# Patient Record
Sex: Female | Born: 1950 | Race: White | Hispanic: No | Marital: Single | Smoking: Current every day smoker
Health system: Southern US, Community
[De-identification: ages and names within clinical notes are randomized; demographics above are authoritative.]

## PROBLEM LIST (undated history)

## (undated) DIAGNOSIS — K219 Gastro-esophageal reflux disease without esophagitis: Secondary | ICD-10-CM

## (undated) DIAGNOSIS — R011 Cardiac murmur, unspecified: Secondary | ICD-10-CM

## (undated) DIAGNOSIS — I10 Essential (primary) hypertension: Secondary | ICD-10-CM

## (undated) DIAGNOSIS — K449 Diaphragmatic hernia without obstruction or gangrene: Secondary | ICD-10-CM

## (undated) DIAGNOSIS — R1013 Epigastric pain: Secondary | ICD-10-CM

## (undated) DIAGNOSIS — IMO0002 Reserved for concepts with insufficient information to code with codable children: Secondary | ICD-10-CM

## (undated) DIAGNOSIS — I509 Heart failure, unspecified: Secondary | ICD-10-CM

## (undated) DIAGNOSIS — K222 Esophageal obstruction: Secondary | ICD-10-CM

## (undated) DIAGNOSIS — F419 Anxiety disorder, unspecified: Secondary | ICD-10-CM

## (undated) HISTORY — DX: Essential (primary) hypertension: I10

## (undated) HISTORY — PX: KNEE ARTHROSCOPY: SUR90

## (undated) HISTORY — DX: Diaphragmatic hernia without obstruction or gangrene: K44.9

## (undated) HISTORY — PX: ABDOMINAL HYSTERECTOMY: SHX81

## (undated) HISTORY — DX: Anxiety disorder, unspecified: F41.9

## (undated) HISTORY — DX: Reserved for concepts with insufficient information to code with codable children: IMO0002

## (undated) HISTORY — PX: OTHER SURGICAL HISTORY: SHX169

## (undated) HISTORY — DX: Epigastric pain: R10.13

## (undated) HISTORY — DX: Cardiac murmur, unspecified: R01.1

## (undated) HISTORY — PX: TONSILLECTOMY AND ADENOIDECTOMY: SUR1326

## (undated) HISTORY — DX: Gastro-esophageal reflux disease without esophagitis: K21.9

## (undated) HISTORY — DX: Esophageal obstruction: K22.2

## (undated) NOTE — *Deleted (*Deleted)
NAME:  [name], MRN:  [id number], DOB:  1950/12/05, LOS: 4 ADMISSION DATE:  08/28/2020, CONSULTATION DATE:  08/28/2020 REFERRING MD:  ED, CHIEF COMPLAINT:  Found down   Brief History   9 yo F who presented after being found down, hypoxemic the ED and intubated in the setting of suspected aspiration PNA in setting of large hiatal hernia.   History of present illness   30 yo F with a history of hiatal hernia, schatski ring, hyponatremia who was found down at home by her brother after she was last known normal yesterday.  When EMS arrived, her O2 sats were in the 60s and had difficulty breathing.  She was thus placed on NRB, and when assessed in the ED, decision was made to intubate.   CXR showed right sided atelectasis and likely infiltrate.    Brother is her primary caregiver.  He says that she has had a rough year, with frequent admissions for lightheadedness and confusion and found to be hyponatremic and hypokalemic.  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  She has lost appetite, has episodes of dry heaving and vomiting- usually when she has low Na and low K.  Normally, she has a gradual clinical decline before admission for hyponatremia, however the events over the last 24 hours were very acute.  Patient lives alone, manages her own meds.  She has had substantial weight loss over the last 1 year.    10/21 bedside RN states that they attempted gastric tube placement but unsuccessful. She was not taken to IR yesterday. RN states the plan is to go to IR today.  Past Medical History  Hiatal hernia Hyponatremia Scatski ring Current smoker SIADH - HCTZ contribution, discontinued   Significant Hospital Events   ETT 10/17 >>  Consults:    Procedures:    Significant Diagnostic Tests:  CXR 10/17 >> Right mid to lower lung field density, likely atelectasis. Aspiration is not  excluded. CT Head 10/17 >> no acute process CT Chest 10/17 >> LLL collapse, consolidation bilateral bases, probable aspiration   Micro Data:  COVID 10/17 >> negative  Influenza A/B 10/17 >> negative Tracheal aspirate 10/17 >>  MRSA PCR 10/18 >> negative  BCx2 10/17 >>    UC 10/17 >> 30k proteus >> S-cefazolin, ceftriaxone. R-imipenem, nitrofurantoin  Antimicrobials:  Ceftriaxone 10/17 >> Azithromycin 10/17 >>  Interim history/subjective:  Tmax 99.9 / WBC down to 14.5  On vent - 30% fiO2, PEEP 5 Glucose range 88-95 I/O 1L UOP, +3.5L in last 24 hours   Objective   Blood pressure (!) 194/146, pulse (!) 115, temperature 98.8 F (37.1 C), resp. rate (!) 27, height 5\' 2"  (1.575 m), weight 46.4 kg, SpO2 100 %.    Vent Mode: PRVC FiO2 (%):  [30 %] 30 % Set Rate:  [16 bmp] 16 bmp Vt Set:  [400 mL] 400 mL PEEP:  [5 cmH20-30 cmH20] 5 cmH20 Plateau Pressure:  [14 cmH20-18 cmH20] 18 cmH20   Intake/Output Summary (Last 24 hours) at 09/01/2020 0929 Last data filed at 09/01/2020 0538 Gross per 24 hour  Intake 2139.59 ml  Output 1050 ml  Net 1089.59 ml   Filed Weights   08/30/20 0326 08/31/20 0116 09/01/20 0220  Weight: 43.6 kg 45.5 kg 46.4 kg    Examination: General: cachectic / frail adult female lying in bed on vent in NAD HEENT: MM pink/moist, ETT, small  bore nasogastric feeding tube Neuro: sedate, fixed gaze, not following commands; gag reflex  CV: s1s2 rrr, no m/r/g PULM: non-labored on vent, lungs bilaterally coarse; small white thick secretions GI: soft, bsx4 active  Extremities: warm/dry, no edema  Skin: thin dry skin with multiple areas of ecchymosis, mottling on LE's  Assessment & Plan:   71 yo F with a recent significant functional decline, episodes of hyponatremia (due to thiazides, hypovolemia, and ?SIADH), weight loss who presents after being found down and hypoxic, intubated in the ED for airway protection.   Acute hypoxic respiratory failure Suspected  Aspiration PNA Suspected aspirated based on CXR  -continue rocephin / azithro for possible CAP, add stop date for 7 days total -PRVC 8cc/kg, rate 16 -wean PEEP / FiO2 for sats 88-95% -daily SBT / WUA  -RASS Goal 0 to -1  -fentanyl gtt per PAD protocol with PRN versed  -Will have discussion with family about long term goals -patient did not pass SBT due to apnea. Ordering ABG to evaluate for low PaCO2. -possibly decrease minute ventilation pending ABG results  Acute Metabolic Encephalopathy Normal sodium. Unclear etiology- may be due to hypoxia and/or metabolic abnormalities.  CT head neg. -supportive care -delirium prevention measures   AKI Hypokalemia - resolved Hyponatremia Hypocalcemia Mild rhabdo (CK 1600 on admission) - improving -CK trend clearing, continue gentle fluids -Trend BMP / urinary output -Replace electrolytes as indicated -Avoid nephrotoxic agents, ensure adequate renal perfusion -Consider Calcium gluconate if calcium continue to drop or if patient becomes symptomatic  Possible Proteus UTI vs Colonization  UC on admit obtained with 30k proteus, pt unable to state if she is having symptoms  -rocephin as above  -suspect this is colonization rather than acute inciting event for admit  Hiatal Hernia Esophageal Web  -HOB elevated  -aspiration precautions  -small bore gastric tube placed -H2 blocker  Failure to thrive Severe Protein Calorie Malnutrition  Unintentional weight loss, admissions for metabolic abnormalities.  Could be due to her large hiatal hernia, and esophageal web.  CT head and chest neg for underlying cancer and /or paraneoplastic syndromes -small bore gastric tube placed > IR supposed to attempt placement today -resume TF at 58ml/hr once advanced -Consider TPN if unsuccessful g tube placement by IR     Best practice:  Diet: NPO Pain/Anxiety/Delirium protocol (if indicated): yes VAP protocol (if indicated): yes DVT prophylaxis: heparin  SQ GI prophylaxis: pepcid Glucose control: SSI q4 Mobility: Bedrest Code Status: Full Family Communication: Will update Brother Disposition: ICU  Critical care time: 60 minutes    Canary Brim, MSN, NP-C, AGACNP-BC [facility] 09/01/2020, 9:29 AM   Please see [email or web address] for pager details.

## (undated) NOTE — *Deleted (*Deleted)
NAME:  [name], MRN:  [id number], DOB:  1951/03/30, LOS: 3 ADMISSION DATE:  08/28/2020, CONSULTATION DATE:  08/28/2020 REFERRING MD:  ED, CHIEF COMPLAINT:  Found down   Brief History   [name] who presented after being found down, hypoxemic the ED and intubated.  Suspected aspiration PNA in setting of large hiatal hernia.   History of present illness   [name] with a history of hiatal hernia, schatski ring, hyponatremia who was found down at home by her brother after she was last known normal yesterday.  When EMS arrived, her O2 sats were in the 60s and had difficulty breathing.  She was thus placed on NRB, and when assessed in the ED, decision was made to intubate.   CXR showed right sided atelectasis and likely infiltrate.    Brother is her primary caregiver.  He says that she has had a rough year, with frequent admissions for lightheadedness and confusion and found to be hyponatremic and hypokalemic.  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  She has lost appetite, has episodes of dry heaving and vomiting- usually when she has low Na and low K.  Normally, she has a gradual clinical decline before admission for hyponatremia, however the events over the last 24 hours were very acute.  Patient lives alone, manages her own meds.  She has had substantial weight loss over the last 1 year.    Past Medical History  Hiatal hernia Hyponatremia Scatski ring Current smoker SIADH - HCTZ contribution, discontinued   Significant Hospital Events   ETT 10/17 >>  Consults:    Procedures:  10/18 EEG >>This study is suggestive of severe diffuse encephalopathy, nonspecific etiology.  No seizures or epileptiform discharges were seen throughout the recording  Significant Diagnostic Tests:  CXR 10/17 >> Right mid to lower lung field density, likely atelectasis. Aspiration is not excluded.  CT Head 10/17 >> no acute process CT Chest 10/17 >> LLL collapse, consolidation bilateral bases, probable aspiration   Micro Data:  COVID 10/17 >> negative  Influenza A/B 10/17 >> negative Tracheal aspirate 10/17 >>  MRSA PCR 10/18 >> negative  BCx2 10/17 >>    UC 10/17 >> 30k proteus >>   Antimicrobials:  Ceftriaxone 10/17 >> Azithromycin 10/17 >>  Interim history/subjective:  Tmax 99.9 / WBC 16.9 On vent - 40% / PEEP 10 Glucose range 121-152 I/O UOP, net even for 24 hours RN reports pt vomited, NGT removed. ST in 130-140's with stimulation but 80's at rest.   Objective   Blood pressure 107/69, pulse 93, temperature 100 F (37.8 C), resp. rate 16, height 5\' 2"  (1.575 m), weight 45.5 kg, SpO2 100 %.    Vent Mode: PRVC FiO2 (%):  [30 %-40 %] 30 % Set Rate:  [16 bmp-20 bmp] 16 bmp Vt Set:  [400 mL] 400 mL PEEP:  [5 cmH20-10 cmH20] 5 cmH20 Plateau Pressure:  [15 cmH20-23 cmH20] 15 cmH20   Intake/Output Summary (Last 24 hours) at 08/31/2020 0801 Last data filed at 08/31/2020 0600 Gross per 24 hour  Intake 4559.4 ml  Output 1000 ml  Net 3559.4 ml   Filed Weights   08/28/20 2003 08/30/20 0326 08/31/20 0116  Weight: 36.3 kg 43.6 kg 45.5 kg    Examination: General: cachectic frail adult female lying in bed in NAD  HEENT: MM pink/moist, ETT, upward gaze, did get patient to  make brief eye contact Neuro: opens eyes to voice, upward gaze as above, sedate on fentanyl, no follow commands  CV: s1s2 RRR, ST on monitor, no m/r/g PULM: non-labored on vent, diminished breath sounds bilaterally, faint wheezing on left GI: soft, bsx4 active  Extremities: warm/dry, no edema  Skin: thin, dry skin with multiple areas of ecchymosis   Assessment & Plan:   27 yo F with a recent significant functional decline, episodes of hyponatremia (due to thiazides, hypovolemia, and ?SIADH), weight loss who presents after being found down and hypoxic, intubated in the ED for airway protection.    Acute hypoxic respiratory failure Suspected Aspiration PNA Suspected aspirated based on CXR  -continue rocephin, azithromycin for possible CAP; awaiting results of trach aspirate -PRVC 8cc/kg, rate reduced 16 -wean PEEP / fiO2 for sats 88-95% -daily SBT / WUA  -RASS goal 0 to -1  -Fentanyl gtt for sedation / pain -PRN versed  -CPT Q4; RT unable to complete due to Tachycardia  Acute Metabolic Encephalopathy Normal sodium. Unclear etiology- may be due to hypoxia and/or metabolic abnormalities.  CT head neg. -supportive care  -delirium prevention measures   Smoking History -Prescribed Nicotine Patch to help prevent withdrawal -Pulmicort neb BID -Xopenex prn for wheezing. (No albuterol at this time due to tachycardia)  AKI Hypokalemia - resolved Hyponatremia Mild rhabdo (CK 1600 on admission) - improving -Trend BMP / urinary output -Replace electrolytes as indicated -Avoid nephrotoxic agents, ensure adequate renal perfusion -follow CK, clearing   Hiatal Hernia Esophageal Web  -HOB elevated -aspiration precautions  Failure to thrive Severe Protein Calorie Malnutrition  Unintentional weight loss, admissions for metabolic abnormalities.  Could be due to her large hiatal hernia, and esophageal web.  CT head and chest neg for underlying cancer and /or paraneoplastic syndromes -attempt to place small bore feeding tube, may need fluoro guided placement  -consider CT ABD/Pelvis  -restart TF after Dop Off placement confirms tube in correct position -? If she would be a candidate for any intervention for hernia given overall deconditioning    Best practice:  Diet: NPO Pain/Anxiety/Delirium protocol (if indicated): yes VAP protocol (if indicated): yes DVT prophylaxis: heparin subq GI prophylaxis: pepcid Glucose control: SSI q4 Mobility: Bedrest Code Status: Full Family Communication: Brother, Colette Ribas, called for update.  Message left for return call.  Disposition: ICU    Critical care time: 34 minutes    Canary Brim, MSN, NP-C, AGACNP-BC  [facility] 08/31/2020, 8:01 AM   Please see [email or web address] for pager details.

---

## 2004-05-06 ENCOUNTER — Inpatient Hospital Stay (HOSPITAL_COMMUNITY): Admission: EM | Admit: 2004-05-06 | Discharge: 2004-05-10 | Payer: Self-pay | Admitting: Emergency Medicine

## 2004-06-13 ENCOUNTER — Encounter: Admission: RE | Admit: 2004-06-13 | Discharge: 2004-06-29 | Payer: Self-pay | Admitting: Specialist

## 2004-07-20 ENCOUNTER — Ambulatory Visit: Payer: Self-pay | Admitting: Nurse Practitioner

## 2004-07-21 ENCOUNTER — Ambulatory Visit: Payer: Self-pay | Admitting: *Deleted

## 2004-09-12 ENCOUNTER — Inpatient Hospital Stay (HOSPITAL_COMMUNITY): Admission: RE | Admit: 2004-09-12 | Discharge: 2004-09-14 | Payer: Self-pay | Admitting: Specialist

## 2010-07-11 ENCOUNTER — Emergency Department (HOSPITAL_COMMUNITY): Admission: EM | Admit: 2010-07-11 | Discharge: 2010-07-11 | Payer: Self-pay | Admitting: Family Medicine

## 2010-09-10 ENCOUNTER — Emergency Department (HOSPITAL_COMMUNITY): Admission: EM | Admit: 2010-09-10 | Discharge: 2010-09-10 | Payer: Self-pay | Admitting: Emergency Medicine

## 2011-03-30 NOTE — Op Note (Signed)
NAMEVIKTORYA, [name]                 ACCOUNT NO.:  [id number]   MEDICAL RECORD NO.:  [id number]          PATIENT TYPE:  INP   LOCATION:  0002                         FACILITY:  [facility]   PHYSICIAN:  Kerrin Champagne, M.D.   DATE OF BIRTH:  [birth year]   DATE OF PROCEDURE:  09/12/2004  DATE OF DISCHARGE:                                 OPERATIVE REPORT   PREOPERATIVE DIAGNOSIS:  Right humeral head deformity, posttraumatic, status  post open reduction and internal fixation, three-part humeral neck fracture,  with persistent pain and discomfort.  Computed tomography scan demonstrating  severe articular incongruity and humeral head deformity.   POSTOPERATIVE DIAGNOSES:  1.  Right humeral head deformity, posttraumatic, status post open reduction      and internal fixation, three-part humeral neck fracture, with persistent      pain and discomfort.  Computed tomography scan demonstrating severe      articular incongruity and humeral head deformity.  2.  Severe deformity of the humeral head, with cartilage loss medially      noted, and internal rotation and abduction deformity of the humeral head      noted.   PROCEDURE:  Right shoulder [employer] noncemented hemiarthroplasty using a  #8 press fit pour coated stem and a size 44 x 15 mm head, repair of greater  tuberosity to the prosthesis and lesser tuberosity using two #2 fiber wire,  repair of the subscapularis to the greater tuberosity.   SURGEON:  Kerrin Champagne, M.D.   ASSISTANT:  Wende Neighbors, P.A.-C.   ANESTHESIA:  GOT, Dr. Okey Dupre.   ESTIMATED BLOOD LOSS:  150-200 cc.   DRAINS:  Hemovac x 1 right shoulder.   BRIEF CLINICAL HISTORY:  This patient is a 60 year old female who sustained  injury to her right shoulder in June 2005.  The patient reportedly fell over  her dog and landed on her right shoulder.  Immediate pain and discomfort.  Seen in the emergency room at [facility], with a right shoulder fracture  dislocation, an  apparent two-part fracture with impaction of the humeral  head into a nearly horizontal position.  Patient advised to undergo a closed  reduction and internal fixation of the fracture that evening.  However, she  refused, and after several days of hospitalization with control of pain  medicines, the patient then eventually decided to go ahead with surgical  intervention in the form of a closed reduction of the humeral head and  glenohumeral joint and then open reduction and internal fixation of the  greater tuberosity.  Following this, the fracture of the greater tuberosity  did go on to heal.  The patient, however, had persistent pain and discomfort  in the shoulder, follow-up CT scan demonstrating severe interarticular  incongruity between the humeral head and the glenoid present.  Overall, the  impacted humeral head did not appear to show significant articulation with  the glenoid surface, although it did appear to be viable.  The patient is  brought to the operating room to undergo a right shoulder hemiarthroplasty.  A cemented hemiarthroplasty is planned.  However, at the time of the  procedure, it was felt that an uncemented stem provided excellent fixation  of the implant.   INTRAOPERATIVE FINDINGS:  Severe deformity of the humeral head.  Humeral  head rotated internally to almost 90 degrees retroversion, the patient  having no articular cartilage over the medial aspect of the humeral head  that was articulating with the glenoid.  The glenoid itself, though, showed  good articular cartilage and no significant deformity there.  Therefore,  hemiarthroplasty was carried out.   DESCRIPTION OF PROCEDURE:  After adequate general anesthesia, with the  patient on the __________  shoulder frame, right upper extremity prepped  from the wrist to the right periaxillary region, over the shoulder, over the  anterior upper chest wall, and over the scapula with Hibiclens and alcohol  prep, as she  had an iodine allergy.  Draped in the usual manner.  A non-  iodine Vi-Drape was used.  Incision in the standard deltopectoral approach  to the right anterior shoulder in line with the coracoid process proximally  and in line with the anterior aspect of the deltoid insertion in the  proximal humerus laterally, and through the skin and subcutaneous layers,  the incision length about 15 cm, carried to the superficial fascia, the  deltopectoral interval.  This was developed using the Metzenbaum scissors,  the cephalic vein ligated both proximal and distal.  Blunt dissection then  used to develop the interval between the deltoid and the pectoralis muscle  to the anterior clavipectoral fascia.  The anterior clavipectoral fascia was  then incised down to the area of the previous greater tuberosity fracture at  its repair site.  A single small fragment of screw was removed and wire then  cut, and the 18-gauge wire then removed from the greater tuberosity fracture  fragment.  The fracture site had healed at this site.   The interval between the greater tuberosity and lesser tuberosity was then  developed, and electrocautery used to perform sharp dissection then of the  subscapularis off of its attachment to the greater tuberosity, and then this  was carried medially.  Two 0 Ethibond sutures were then placed into the  subscapularis flap.  Note that attempts at trying to deliver or find the  biceps tendon were unsuccessful.  It was felt the biceps tendon most likely  had ruptured at the time of the previous fracture dislocation, as its  position alignment was never found.  Biceps muscle distally was found.  It  was felt that the biceps tendon itself, though ruptured, most likely  remained within this anterior soft tissue flap that was developed off of the  greater tuberosity and continued medially over the region of the expected bicipital groove.  There was a great deal of callus in the region of  the  bicipital groove, and again this was continued medially, developing very  large anteromedial flap for reapproximation later.  Incision was then  carried superiorly into the interval between the subscapularis and the  supraspinatus.   Circumferential exposure then obtained over the proximal portion of the  humerus using a Cobb elevator anteriorly and medially.  A 1-inch straight  osteotome was then used to osteotomize the greater tuberosity to allow for  its retraction and then exposure of the lateral and posterior aspect of the  humerus proximally.  Following this then, with extension, external rotation,  the proximal portion of the humerus was able to be delivered into the  incision.  Retractors  were placed about the neck of the humerus, and an  oscillating saw was then used to incise the humeral head, removing about 3  or 4 mm of head at maximum.  The cut was made in a retroverted position at  about 30-40 degrees retroversion.   Leksell rongeurs were then used to debride osteophytes anteriorly, medially,  and posteriorly, and debride healed humeral head cartilage material that was  found to be present posteriorly, removing all articular cartilage to allow  for bony healing of the greater tuberosity to these areas at the end of the  case.  Two #2 fiber wires were passed through drill holes through the  greater tuberosity.  These were then used to allow to retract the greater  tuberosity throughout the remainder of the case.   With the cut then made, an initial reamer was then used to perform the  initial reaming.  This was then placed through the expected area, the  interval between the greater tuberosity and the expected area where the  humeral head normally would have been, the sulcus here.  Further reaming was  then carried up to a #10 reamer.  A size 10 implant was then impacted into  place.  However, it remained about 3 or 4 mm __________ .  A trial reduction  was performed  using a small 44 x 15 mm head, and this was felt to be much  too tight and somewhat overstuffed, so that it was felt the implant should  be further impacted.  However, with further impaction, the posterior aspect  of the proximal humerus showed a fracture line developing so that we  returned to a size #8 implant, and this was then easily placed and impacted  down such that the calcar or the rim of the prosthesis was against bone.  This provided also excellent bone to prosthesis fit with an excellent press  fit, the implant at about 40 degrees of retroversion.  A good bony surface  for reattachment of the greater tuberosity was felt to be present.  Drill  holes were placed through the lesser tuberosity to allow for placement of  fiber wires through these drill holes and through the prosthesis for  fixation of the greater tuberosity fracture fragment at the end of the case. The trial implant was then reduced after replacing a 4 mm x 15 mm head and  provided excellent fit.  This did not show overstuffing, nor did it appear  to show significant instability or laxity.  It appeared to give an excellent  fit.  Irrigation was performed.  A permanent #8 stem was then brought into  stem.  The anterior flange aligned with the bicipital groove, and then  impacted into place in about 40 degrees of retroversion.  This was carried,  the implant was completed, and we impacted it into place.  There did appear  to be some minimal widening of the fracture line of the posterior aspect of  the proximal humerus.  However, it was felt to have excellent stability, and  it was felt that this would eventually heal as well as the greater  tuberosity fracture fragment postoperatively.  With this then in place, the  44 mm x 15 mm head __________  fit carefully dried and placed into  positional alignment and then impacted into place using the impacter and the  mallet.  With this completed, then the greater tuberosity was  approximated  through the single flange over the posterior aspect of  the implant and then  through the drill holes of the lesser tuberosity.  These were then sewn into  place without difficulty, reapproximating the greater tuberosity laterally.  The anterior shoulder subscapularis flap was then carefully approximated to  the greater tuberosity and supraspinatus using interrupted 0 Ethibond  sutures.  This provided excellent approximation of the patient's rotator  cuff over the prosthesis and the proximal portion of the humerus in  excellent positional alignment.  Irrigation was then performed.  Medium  Hemovac drain placed to the depth of the incision.  No active bleeding  appeared to be present.  The superficial fascial layer of the deltoid and  pectoralis was then approximated with interrupted 0 Vicryl sutures, deep  subcutaneous layers approximated with interrupted 0 Vicryl sutures, and more  superficial with interrupted 2-0 Vicryl sutures, and the skin closed with a  running subcuticular stitch of 4-0 Vicryl.  Tincture of Benzoin and Steri-  Strips were applied.  4 x 4s were fixed to the skin with paper tape.  The  patient was then returned to her bed, reactivated, extubated, and returned  to the recovery room in satisfactory condition.      JEN/MEDQ  D:  09/12/2004  T:  09/12/2004  Job:  [id number]

## 2011-03-30 NOTE — Op Note (Signed)
[name], [name]                             ACCOUNT NO.:  [id number]   MEDICAL RECORD NO.:  [id number]                   PATIENT TYPE:  INP   LOCATION:  0460                                 FACILITY:  [facility]   PHYSICIAN:  Kerrin Champagne, M.D.                DATE OF BIRTH:  20-Jun-1951   DATE OF PROCEDURE:  05/08/2004  DATE OF DISCHARGE:                                 OPERATIVE REPORT   PREOPERATIVE DIAGNOSIS:  Right shoulder humeral neck fracture/dislocation  with an impacted valgus head fracture and a displaced comminuted greater  tuberosity fracture.  Anterior inferior dislocation.   POSTOPERATIVE DIAGNOSIS:  Right shoulder humeral neck fracture/dislocation  with an impacted valgus head fracture and a displaced comminuted greater  tuberosity fracture.  Anterior inferior dislocation.   PROCEDURE:  Closed reduction, right shoulder dislocation, and open  reduction/internal fixation of the right greater tuberosity fracture using a  deltoid splitting incision with interfragmentary suturing using a #2 fiber  wire x6 sutures and then a 20 gauge tension band wire to a 4.0 KSLS screw  distal to the fracture site.   SURGEON:  Kerrin Champagne, M.D.   ANESTHESIA:  GOT, Dr. Shireen Quan.   ESTIMATED BLOOD LOSS:  150 cc.   DRAINS:  None.   CLINICAL HISTORY:  Patient is a 60 year old right-hand dominant female who  fell over her dog 2-1/2 days ago.  She fell, landing on her right shoulder.  She was seen initially in the emergency room and scheduled for surgery at 3  a.m. on Saturday morning.  Patient decided that she did not want to undergo  general anesthetic and refused.  She was admitted for pain control and seen  for evaluation.  It was felt that she had suffered from an acute anxiety  attack and post-traumatic stress syndrome.  She responded to basically time  and counseling with surgeons, and the family decided to go ahead with  surgery.  The surgery was scheduled for today.   INTRAOPERATIVE FINDINGS:  The patient was found to have a comminuted greater  tuberosity fracture, a dislocation anterior and inferior, with an impacted  valgus head and neck fracture.  The head and neck fracture was left  impacted, and the greater tuberosity fracture was treated with  interfragmentary suture and a tension band wire using a 20 gauge wire.   DESCRIPTION OF PROCEDURE:  After adequate general anesthesia, the patient  was placed into a semi-sitting position with a Schlein shoulder frame.  She  had undergone standard preoperative antibiotics.   She has an allergy to iodine.  A preparation was performed using Hibiclens  solution following closed reduction observation under C-arm, but the  fracture remained displaced at the greater tuberosity; however, the head did  appear to reduce within the glenoid cavity.  The decision to go ahead and  proceed with surgery at that point.  A deltoid splitting incision  will be  used.  She underwent standard prep with Hibiclens solution and then  underwent a drape in the usual manner.  A clear sterile Vidrape was used.  The incision, approximately 8 cm in length, extending from just above the  anterolateral aspect of the acromion process, extending distally in line  with the anterior one-third of the deltoid.  Through the skin and  subcutaneous layers, measurements made at the lateral aspect of the acromion  5 cm distal, stopping the incision at this point through the deltoid muscle.  An incision using electrocautery over the superior aspect and lateral aspect  of the acromion.  Continued in line, splitting the deltoid anterior one-  third raphe.  Spread.  Then a Weitlaner placed.  Subperiosteal dissection  carried both anteriorly and laterally, exposing the anterolateral aspect of  the acromion process.  The bursa of the subacromial region was easily  entered.  The fracture of the greater tuberosity excellently identified.  It  appeared to be  quite obvious within the incision.  The axillary nerve artery  identified and maintained at the distal end of the incision.  The fracture  site opened and debrided.  Old hematoma present and soft tissue present.  Drill holes were then placed to a number over the anterior aspect of the  fracture line for the greater tuberosity using a small drill bit, a 364  cinch drill bit.  Through these drill holes, passed a #2 fiber wire.  An  additional two drill holes were placed into the larger part of the  metaphyseal portion of the fragment of the greater tuberosity.  The  guidewire passed through these areas.  The shoulder then brought into  abduction, and the wires then carefully used to reduce the fracture site  anteriorly.  Additional fiber wires were then passed from a large fragment  of the greater tuberosity over the anterior aspect of the greater tuberosity  to the posterior aspect of the greater tuberosity, suturing these areas with  simple sutures of fiber wire from anterior to posterior.  A total of six  sutures were placed.  It is felt that this provided good anterior fixation  of the fracture fragments; however, the patient did not have adequate  posterior fracture fixation nor did she have tension band at this point, so  that a drill hole was placed about 1.5 cm distal to the very end of the  fracture of the greater tuberosity distally.  A drill hole was placed using  a 2.5 drill bit.  This was tapped using a 4.0 tap and then a screw an  additional 2 mm larger than expected was passed, leaving it proud.  A 22  gauge wire was then carefully passed around the prominent screw head.  This  then figure-of-eight'd and passed beneath the insertion of the rotator cuff  into the greater tuberosity posterior to anterior.  This was passed using an  18 gauge angiocath catheter.  This wire was then carefully tightened, and in a clockwise fashion tightening the figure-of-eight, tension band, and   reducing the greater tuberosity fracture fragment.  Intraoperative C-arm  fluoro was then used to ascertain reduction of the greater tuberosity  fracture fragment with a single tension band wire and multiple  interfragmentary sutures.  Thus completed, the irrigation was performed.  Permanent C-arm images were obtained in the AP and lateral planes.  These  demonstrated some prominence of the greater tuberosity but reduction of the  greater tuberosity overall.  The valgus impacted the humerus head and was  left in positional alignment so as to prevent avascular necrosis.  Following  further irrigation, the incision was closed, approximating the superficial  fascial layers of the deltoid with interrupted 2-0 Vicryl sutures as well as  the periosteal layers over the anterolateral acromion using 2-0 interrupted  Vicryl sutures.  Deep subcu layers were approximated with interrupted 2-0  Vicryl sutures, and the skin was closed with a running subcu stitch of 4-0  Vicryl.  Tincture of Benzoin and Steri-Strips applied, Adaptic, 4x4's fixed  to the skin with hyper-fixed tape.  Patient was then placed into a shoulder  immobilizer.  The patient was then reactivated, extubated, and returned to  the recovery room in satisfactory condition.  All instrument and sponge  counts were correct.                                               Kerrin Champagne, M.D.    Myra Rude  D:  05/08/2004  T:  05/09/2004  Job:  [id number]

## 2011-03-30 NOTE — Discharge Summary (Signed)
[name], [name]                             ACCOUNT NO.:  [id number]   MEDICAL RECORD NO.:  [id number]                   PATIENT TYPE:  INP   LOCATION:  0460                                 FACILITY:  [facility]   PHYSICIAN:  Kerrin Champagne, M.D.                DATE OF BIRTH:  1951-07-09   DATE OF ADMISSION:  05/05/2004  DATE OF DISCHARGE:  05/10/2004                                 DISCHARGE SUMMARY   ADMISSION DIAGNOSES:  1. Three-part comminuted proximal humerus fracture.  2. Hypertension.  3. Bipolar disorder.   DISCHARGE DIAGNOSES:  1. Right shoulder humeral neck fracture, dislocation with an impacted valgus     head fracture and a displaced comminuted greater tuberosity fracture as     well as anterior-inferior dislocation.  2. Three-part comminuted proximal humerus fracture.  3. Hypertension.  4. Bipolar disorder.  5. Mild post hemorrhagic anemia.   PROCEDURE:  On May 08, 2004, the patient underwent closed reduction of  right shoulder dislocation with open reduction, internal fixation of right  greater tuberosity fracture using a deltoid splinting incision with  interfragmentary sutures and 28-gauge tension band wire technique with  screws distal to the fracture site. This was performed by Dr. Otelia Sergeant under  general anesthesia.   CONSULTATIONS:  Psychiatry consult by Dr. Milford Cage.   BRIEF HISTORY:  The patient is a 60 year old right hand dominant female who fell over her  dog on the day of admission. She landed on her right shoulder and had  immediate onset of discomfort in the right shoulder. She was seen in [facility] emergency room where x-rays were performed and did show the right  shoulder humeral neck fracture. She was advised it would need surgical  intervention and was admitted for surgery. However, when she was taken to  the operating room the patient decided she did not want to undergo general  anesthetic and refused to proceed. She was admitted for pain  control and  seen for evaluation. A psychiatric consult was obtained and it was felt that  she had suffered from an acute anxiety attack and posttraumatic stress  syndrome. She decided that she would undergo the surgical procedure once she  had had time to think about it and undergo counseling by her family as well  as the surgeons. She underwent the above stated procedure without  complications. During the hospital stay she was admitted, she did require  narcotic analgesics to keep her comfortable. On May 07, 2004, she was seen  by the psychiatrist and once again did feel to have experienced an acute  anxiety attack secondary to posttraumatic stress disorder from a previous  surgical procedure that was complicated. Following the procedure she was in  much better spirits. She felt her pain was well controlled with PCA  analgesics and gradually was weaned to p.o. analgesics. She had no numbness  or tingling of  the right upper extremity or hand. Postoperatively, she did  have an elevated temperature to 101.2. She was treated with incentive  spirometry. The wound was checked on the second postoperative day and she  had no drainage with mild edema and ecchymosis, but no erythema. She was  afebrile and vital signs were stable on the second postoperative day. The  patient was independent with mobility as she was out of bed and ambulating.  Physical therapy had assisted her somewhat with her activity level. On May 10, 2004, her second postoperative day, she was felt stable for discharge to  her home to have assistance by her family.   PERTINENT LABORATORY VALUES:  Admission labs include a CBC which showed WBC  of 13.7, hemoglobin and hematocrit 13.3 and 39 respectively. BMET on  admission with glucose of 104 and BUN of 5.   EKG on admission revealed a normal sinus rhythm.  Chest x-ray on admission  with cardiomegaly and ectatic tortuous thoracic aorta.   PLAN:  The patient was discharged to  her home. She was instructed in no  range of motion of the shoulder. She was instructed to wear her shoulder  immobilizer at all times as taught to her by the occupational therapist  during her hospital stay.  She did receive occupational therapy for  activities of daily living and did quite well with this and was felt to be  able to manage at home. Dressing changes will be done daily at  home and she  will keep the wound dry and clean.  Medications given at discharge include  Percocet and Robaxin. The patient is encouraged to use a stool softener  daily and will resume her home medications. Will see her back in the office  two weeks from the date of surgery. All questions were encouraged and  answered.     Wende Neighbors, P.A.                    Kerrin Champagne, M.D.    SMV/MEDQ  D:  05/30/2004  T:  05/30/2004  Job:  [id number]

## 2011-03-30 NOTE — H&P (Signed)
Crystal, Mcmillan                             ACCOUNT NO.:  [id number]   MEDICAL RECORD NO.:  [id number]                    PATIENT TYPE:   LOCATION:                                       FACILITY:   PHYSICIAN:  Kerrin Champagne, M.D.                DATE OF BIRTH:   DATE OF ADMISSION:  05/06/2004  DATE OF DISCHARGE:                                HISTORY & PHYSICAL   CHIEF COMPLAINT:  Right shoulder pain.   HISTORY OF PRESENT ILLNESS:  Patient is a 60 year old female who earlier  today had tripped over her dog and fell down some steps.  She had the  immediate onset of pain to her right upper extremity.  She was subsequently  brought to the [facility] emergency department, where she was noted to have  a three-part comminuted proximal humerus fracture.  Orthopedics were then  consulted for further management and treatment.  Dr. Otelia Sergeant feels that it is  best to take the patient to surgery for closed reduction and then open  reduction/internal fixation with tension band wiring technique and screws of  the right proximal humerus fracture.  Risks and benefits of the surgery were  discussed with the patient, and the patient wishes to proceed.   PAST MEDICAL HISTORY:  1. Hypertension.  2. Bipolar.   PAST SURGICAL HISTORY:  Hysterectomy.   DRUG ALLERGIES:  No known drug allergies but she is allergic to BETADINE.   MEDICATIONS:  Patient does not know the names of her medications, doses, or  schedules at this time.  Family has been instructed to bring the medications  to the hospital for the nurses to order.   SOCIAL HISTORY:  Patient smokes a half pack of cigarettes per day.  Denies  any alcohol intake.  Lives in a one-story house with 4-5 steps entering the  house.   FAMILY HISTORY:  Unremarkable.   REVIEW OF SYSTEMS:  GENERAL:  Denies fever, chills, night sweats, bleeding  tendencies.  CNS:  Denies vertigo, double vision, seizures, headaches, or  paralysis.  RESPIRATORY:  Denies  shortness of breath, productive cough,  hemoptysis.  CV:  Denies chest pain, angina, or orthopnea.  GI:  Positive  constipation and diarrhea.  Denies nausea or vomiting, melena, or bloody  stools.  GU:  Denies dysuria, hematuria, or discharge.  MUSCULOSKELETAL:  Pertinent for HPI.   PHYSICAL EXAMINATION:  VITAL SIGNS:  Temp 98.2, pulse 126, respirations 20,  blood pressure 162/106.  GENERAL:  A well-developed and well-nourished 60 year old female.  HEENT:  Normocephalic and atraumatic.  Pupils are equal, round and reactive  to light.  NECK:  No carotid bruit noted.  LUNGS:  Clear to auscultation bilaterally.  No wheezes or crackles.  HEART:  Regular rate and rhythm with no murmurs, rubs or gallops.  ABDOMEN:  Soft, nontender, nondistended.  Positive bowel sounds x4.  EXTREMITIES:  She has  some pain on range of motion of the right shoulder.  She has obvious swelling to the right shoulder.  She is neurovascularly  intact to the right upper extremity.  NEUROLOGIC:  Alert and oriented x 3.  SKIN:  No rashes or lesions.   X-rays reveal a three-part comminuted proximal humerus fracture on the right  side.   IMPRESSION:  1. A three-part comminuted proximal humerus fracture.  2. Hypertension.  3. Bipolar.   PLAN:  Patient will be admitted to [facility] and undergone closed  reduction and then open reduction/internal fixation with a tension  band/wiring technique with screws by Dr. Vira Browns.     Clarene Reamer, P.A.-C.                   Kerrin Champagne, M.D.    SW/MEDQ  D:  05/06/2004  T:  05/06/2004  Job:  [id number]

## 2011-05-18 ENCOUNTER — Emergency Department (HOSPITAL_COMMUNITY)
Admission: EM | Admit: 2011-05-18 | Discharge: 2011-05-18 | Disposition: A | Discharge status: Home / Self Care | Payer: BC Managed Care – PPO | Attending: Emergency Medicine | Admitting: Emergency Medicine

## 2011-05-18 DIAGNOSIS — F1010 Alcohol abuse, uncomplicated: Secondary | ICD-10-CM | POA: Insufficient documentation

## 2011-05-18 DIAGNOSIS — I10 Essential (primary) hypertension: Secondary | ICD-10-CM | POA: Insufficient documentation

## 2011-05-18 DIAGNOSIS — E871 Hypo-osmolality and hyponatremia: Secondary | ICD-10-CM | POA: Insufficient documentation

## 2011-05-18 DIAGNOSIS — K2970 Gastritis, unspecified, without bleeding: Secondary | ICD-10-CM | POA: Insufficient documentation

## 2011-05-18 LAB — URINE MICROSCOPIC-ADD ON

## 2011-05-18 LAB — CBC
HCT: 42.3 % (ref 36.0–46.0)
Hemoglobin: 14.9 g/dL (ref 12.0–15.0)
MCH: 30.3 pg (ref 26.0–34.0)
MCHC: 35.2 g/dL (ref 30.0–36.0)
MCV: 86.2 fL (ref 78.0–100.0)
Platelets: 372 10*3/uL (ref 150–400)
RBC: 4.91 MIL/uL (ref 3.87–5.11)
RDW: 13 % (ref 11.5–15.5)
WBC: 14.4 10*3/uL — ABNORMAL HIGH (ref 4.0–10.5)

## 2011-05-18 LAB — DIFFERENTIAL
Basophils Absolute: 0 10*3/uL (ref 0.0–0.1)
Basophils Relative: 0 % (ref 0–1)
Eosinophils Absolute: 0 10*3/uL (ref 0.0–0.7)
Eosinophils Relative: 0 % (ref 0–5)
Lymphocytes Relative: 17 % (ref 12–46)
Lymphs Abs: 2.5 10*3/uL (ref 0.7–4.0)
Monocytes Absolute: 1 10*3/uL (ref 0.1–1.0)
Monocytes Relative: 7 % (ref 3–12)
Neutro Abs: 10.9 10*3/uL — ABNORMAL HIGH (ref 1.7–7.7)
Neutrophils Relative %: 76 % (ref 43–77)

## 2011-05-18 LAB — URINALYSIS, ROUTINE W REFLEX MICROSCOPIC
Bilirubin Urine: NEGATIVE
Glucose, UA: NEGATIVE mg/dL
Ketones, ur: NEGATIVE mg/dL
Leukocytes, UA: NEGATIVE
Nitrite: NEGATIVE
Protein, ur: NEGATIVE mg/dL
Specific Gravity, Urine: 1.006 (ref 1.005–1.030)
Urobilinogen, UA: 0.2 mg/dL (ref 0.0–1.0)
pH: 6.5 (ref 5.0–8.0)

## 2011-05-18 LAB — RAPID URINE DRUG SCREEN, HOSP PERFORMED
Amphetamines: NOT DETECTED
Barbiturates: NOT DETECTED
Benzodiazepines: NOT DETECTED
Cocaine: NOT DETECTED
Opiates: NOT DETECTED
Tetrahydrocannabinol: NOT DETECTED

## 2011-05-18 LAB — COMPREHENSIVE METABOLIC PANEL
ALT: 35 U/L (ref 0–35)
AST: 35 U/L (ref 0–37)
Albumin: 4.5 g/dL (ref 3.5–5.2)
Alkaline Phosphatase: 117 U/L (ref 39–117)
BUN: 4 mg/dL — ABNORMAL LOW (ref 6–23)
CO2: 25 mEq/L (ref 19–32)
Calcium: 9.8 mg/dL (ref 8.4–10.5)
Chloride: 83 mEq/L — ABNORMAL LOW (ref 96–112)
Creatinine, Ser: 0.57 mg/dL (ref 0.50–1.10)
GFR calc Af Amer: >60 mL/min (ref >60)
GFR calc non Af Amer: >60 mL/min (ref >60)
Glucose, Bld: 92 mg/dL (ref 70–99)
Potassium: 3.7 mEq/L (ref 3.5–5.1)
Sodium: 122 mEq/L — ABNORMAL LOW (ref 135–145)
Total Bilirubin: 0.5 mg/dL (ref 0.3–1.2)
Total Protein: 8 g/dL (ref 6.0–8.3)

## 2011-05-18 LAB — ETHANOL: Alcohol, Ethyl (B): 62 mg/dL — ABNORMAL HIGH (ref 0–11)

## 2011-05-25 ENCOUNTER — Emergency Department (HOSPITAL_COMMUNITY): Payer: BC Managed Care – PPO

## 2011-05-25 ENCOUNTER — Inpatient Hospital Stay (HOSPITAL_COMMUNITY)
Admission: EM | Admit: 2011-05-25 | Discharge: 2011-05-27 | DRG: 566 | Disposition: A | Discharge status: Rehabilitation Facility | Payer: BC Managed Care – PPO | Attending: Nephrology | Admitting: Nephrology

## 2011-05-25 ENCOUNTER — Encounter (HOSPITAL_COMMUNITY): Payer: Self-pay

## 2011-05-25 DIAGNOSIS — N179 Acute kidney failure, unspecified: Secondary | ICD-10-CM | POA: Diagnosis present

## 2011-05-25 DIAGNOSIS — I454 Nonspecific intraventricular block: Secondary | ICD-10-CM | POA: Diagnosis present

## 2011-05-25 DIAGNOSIS — F17200 Nicotine dependence, unspecified, uncomplicated: Secondary | ICD-10-CM | POA: Diagnosis present

## 2011-05-25 DIAGNOSIS — T502X5A Adverse effect of carbonic-anhydrase inhibitors, benzothiadiazides and other diuretics, initial encounter: Secondary | ICD-10-CM | POA: Diagnosis present

## 2011-05-25 DIAGNOSIS — F1010 Alcohol abuse, uncomplicated: Secondary | ICD-10-CM | POA: Diagnosis present

## 2011-05-25 DIAGNOSIS — F319 Bipolar disorder, unspecified: Secondary | ICD-10-CM | POA: Diagnosis present

## 2011-05-25 DIAGNOSIS — R799 Abnormal finding of blood chemistry, unspecified: Secondary | ICD-10-CM | POA: Diagnosis present

## 2011-05-25 DIAGNOSIS — I447 Left bundle-branch block, unspecified: Secondary | ICD-10-CM

## 2011-05-25 DIAGNOSIS — R110 Nausea: Secondary | ICD-10-CM | POA: Diagnosis present

## 2011-05-25 DIAGNOSIS — I9589 Other hypotension: Secondary | ICD-10-CM | POA: Diagnosis present

## 2011-05-25 DIAGNOSIS — E869 Volume depletion, unspecified: Secondary | ICD-10-CM | POA: Diagnosis present

## 2011-05-25 DIAGNOSIS — E871 Hypo-osmolality and hyponatremia: Principal | ICD-10-CM | POA: Diagnosis present

## 2011-05-25 DIAGNOSIS — R197 Diarrhea, unspecified: Secondary | ICD-10-CM | POA: Diagnosis present

## 2011-05-25 DIAGNOSIS — F1020 Alcohol dependence, uncomplicated: Secondary | ICD-10-CM | POA: Diagnosis present

## 2011-05-25 DIAGNOSIS — J189 Pneumonia, unspecified organism: Secondary | ICD-10-CM | POA: Diagnosis present

## 2011-05-25 DIAGNOSIS — R7989 Other specified abnormal findings of blood chemistry: Secondary | ICD-10-CM

## 2011-05-25 HISTORY — DX: Gastro-esophageal reflux disease without esophagitis: K21.9

## 2011-05-25 HISTORY — DX: Heart failure, unspecified: I50.9

## 2011-05-25 LAB — DIFFERENTIAL
Basophils Absolute: 0 10*3/uL (ref 0.0–0.1)
Basophils Relative: 0 % (ref 0–1)
Eosinophils Absolute: 0.1 10*3/uL (ref 0.0–0.7)
Eosinophils Relative: 1 % (ref 0–5)
Lymphocytes Relative: 18 % (ref 12–46)
Lymphs Abs: 1.8 10*3/uL (ref 0.7–4.0)
Monocytes Absolute: 0.9 10*3/uL (ref 0.1–1.0)
Monocytes Relative: 9 % (ref 3–12)
Neutro Abs: 7.1 K/uL (ref 1.7–7.7)
Neutrophils Relative %: 72 % (ref 43–77)

## 2011-05-25 LAB — CBC
HCT: 38.6 % (ref 36.0–46.0)
Hemoglobin: 13.5 g/dL (ref 12.0–15.0)
MCH: 30.3 pg (ref 26.0–34.0)
MCHC: 35 g/dL (ref 30.0–36.0)
MCV: 86.7 fL (ref 78.0–100.0)
Platelets: 270 K/uL (ref 150–400)
RBC: 4.45 MIL/uL (ref 3.87–5.11)
RDW: 12.7 % (ref 11.5–15.5)
WBC: 9.9 10*3/uL (ref 4.0–10.5)

## 2011-05-25 LAB — COMPREHENSIVE METABOLIC PANEL
ALT: 61 U/L — ABNORMAL HIGH (ref 0–35)
AST: 54 U/L — ABNORMAL HIGH (ref 0–37)
Alkaline Phosphatase: 123 U/L — ABNORMAL HIGH (ref 39–117)
BUN: 24 mg/dL — ABNORMAL HIGH (ref 6–23)
CO2: 26 mEq/L (ref 19–32)
Calcium: 8.7 mg/dL (ref 8.4–10.5)
Chloride: 83 mEq/L — ABNORMAL LOW (ref 96–112)
Creatinine, Ser: 1.79 mg/dL — ABNORMAL HIGH (ref 0.50–1.10)
GFR calc Af Amer: 35 mL/min — ABNORMAL LOW (ref >60)
GFR calc non Af Amer: 29 mL/min — ABNORMAL LOW (ref >60)
Glucose, Bld: 187 mg/dL — ABNORMAL HIGH (ref 70–99)
Potassium: 3.6 mEq/L (ref 3.5–5.1)
Total Protein: 6.1 g/dL (ref 6.0–8.3)

## 2011-05-25 LAB — URINALYSIS, ROUTINE W REFLEX MICROSCOPIC
Bilirubin Urine: NEGATIVE
Glucose, UA: NEGATIVE mg/dL
Hgb urine dipstick: NEGATIVE
Ketones, ur: NEGATIVE mg/dL
Leukocytes, UA: NEGATIVE
Nitrite: NEGATIVE
Protein, ur: NEGATIVE mg/dL
Specific Gravity, Urine: 1.011 (ref 1.005–1.030)
Urobilinogen, UA: 0.2 mg/dL (ref 0.0–1.0)
pH: 6 (ref 5.0–8.0)

## 2011-05-25 LAB — TROPONIN I: Troponin I: 0.56 ng/mL (ref <0.30)

## 2011-05-25 LAB — RAPID URINE DRUG SCREEN, HOSP PERFORMED
Amphetamines: NOT DETECTED
Barbiturates: NOT DETECTED
Benzodiazepines: POSITIVE — AB
Cocaine: NOT DETECTED
Opiates: NOT DETECTED
Tetrahydrocannabinol: NOT DETECTED

## 2011-05-25 LAB — COMPREHENSIVE METABOLIC PANEL WITH GFR
Albumin: 3.4 g/dL — ABNORMAL LOW (ref 3.5–5.2)
Sodium: 118 meq/L — CL (ref 135–145)
Total Bilirubin: 0.3 mg/dL (ref 0.3–1.2)

## 2011-05-25 LAB — LACTIC ACID, PLASMA: Lactic Acid, Venous: 1 mmol/L (ref 0.5–2.2)

## 2011-05-25 LAB — PRO B NATRIURETIC PEPTIDE: Pro B Natriuretic peptide (BNP): 3164 pg/mL — ABNORMAL HIGH (ref 0–125)

## 2011-05-25 LAB — CK TOTAL AND CKMB (NOT AT ARMC)
CK, MB: 6.2 ng/mL (ref 0.3–4.0)
Relative Index: 4.3 — ABNORMAL HIGH (ref 0.0–2.5)
Total CK: 144 U/L (ref 7–177)

## 2011-05-25 LAB — PROCALCITONIN: Procalcitonin: 0.35 ng/mL

## 2011-05-25 LAB — ETHANOL: Alcohol, Ethyl (B): <11 mg/dL (ref 0–11)

## 2011-05-26 DIAGNOSIS — I059 Rheumatic mitral valve disease, unspecified: Secondary | ICD-10-CM

## 2011-05-26 LAB — LIPID PANEL
Cholesterol: 141 mg/dL (ref 0–200)
HDL: 71 mg/dL (ref >39)
LDL Cholesterol: 59 mg/dL (ref 0–99)
Total CHOL/HDL Ratio: 2 RATIO
Triglycerides: 56 mg/dL (ref <150)
VLDL: 11 mg/dL (ref 0–40)

## 2011-05-26 LAB — BASIC METABOLIC PANEL
BUN: 15 mg/dL (ref 6–23)
BUN: 8 mg/dL (ref 6–23)
CO2: 26 mEq/L (ref 19–32)
CO2: 30 mEq/L (ref 19–32)
Calcium: 8.3 mg/dL — ABNORMAL LOW (ref 8.4–10.5)
Calcium: 8.5 mg/dL (ref 8.4–10.5)
Chloride: 98 mEq/L (ref 96–112)
Chloride: 100 mEq/L (ref 96–112)
Creatinine, Ser: 1.16 mg/dL — ABNORMAL HIGH (ref 0.50–1.10)
Creatinine, Ser: 0.71 mg/dL (ref 0.50–1.10)
GFR calc Af Amer: 58 mL/min — ABNORMAL LOW (ref >60)
GFR calc Af Amer: >60 mL/min (ref >60)
GFR calc non Af Amer: 48 mL/min — ABNORMAL LOW (ref >60)
GFR calc non Af Amer: >60 mL/min (ref >60)
Glucose, Bld: 78 mg/dL (ref 70–99)
Glucose, Bld: 75 mg/dL (ref 70–99)
Potassium: 3.6 mEq/L (ref 3.5–5.1)
Potassium: 3.7 mEq/L (ref 3.5–5.1)
Sodium: 131 mEq/L — ABNORMAL LOW (ref 135–145)
Sodium: 133 mEq/L — ABNORMAL LOW (ref 135–145)

## 2011-05-26 LAB — PHOSPHORUS: Phosphorus: 1.8 mg/dL — ABNORMAL LOW (ref 2.3–4.6)

## 2011-05-26 LAB — CORTISOL
Cortisol, Plasma: 6.7 ug/dL
Cortisol, Plasma: 8.5 ug/dL

## 2011-05-26 LAB — CARDIAC PANEL(CRET KIN+CKTOT+MB+TROPI)
CK, MB: 5 ng/mL — ABNORMAL HIGH (ref 0.3–4.0)
CK, MB: 4.5 ng/mL — ABNORMAL HIGH (ref 0.3–4.0)
Relative Index: 4.4 — ABNORMAL HIGH (ref 0.0–2.5)
Relative Index: 3.9 — ABNORMAL HIGH (ref 0.0–2.5)
Total CK: 114 U/L (ref 7–177)
Total CK: 114 U/L (ref 7–177)
Troponin I: 0.34 ng/mL (ref <0.30)
Troponin I: <0.3 ng/mL (ref <0.30)

## 2011-05-26 LAB — MAGNESIUM: Magnesium: 1.9 mg/dL (ref 1.5–2.5)

## 2011-05-26 LAB — TSH: TSH: 0.43 u[IU]/mL (ref 0.350–4.500)

## 2011-05-26 NOTE — Consult Note (Signed)
NAMEANALAURA, MESSLER NO.:  [id number]  MEDICAL RECORD NO.:  [id number]  LOCATION:  1423                         FACILITY:  [facility]  PHYSICIAN:  Pricilla Riffle, MD, FACCDATE OF BIRTH:  01/07/1951  DATE OF CONSULTATION:  05/25/2011 DATE OF DISCHARGE:                                CONSULTATION   IDENTIFICATION:  The patient is a 60 year old who we are asked to see regarding abnormal troponin and new left bundle-branch block.  HISTORY OF PRESENT ILLNESS:  The patient has no known history of coronary artery disease.  She presented with mental status changes to the emergency room and was found to be hypotensive and hyponatremic. She is receiving IV fluids.  The patient reports today she has been dizzy, but not usually.  Denies syncope.  No history of chest pain.  She walks without a problem.  No shortness of breath.  She has to be very active in the past, but because of knee issues stopped a few years ago.  She has had a history of anxiety attacks in the past.  She would get chest pain when she had these panic spells, but not at other times.  ALLERGIES:  SHELLFISH and IODINE.  MEDICATIONS ON ADMISSION: 1. Lisinopril 40. 2. Lamictal 150. 3. Premarin 0.3. 4. Bisoprolol/HCTZ 5/6.25. 5. Prevacid 30.  PAST MEDICAL HISTORY: 1. Hypertension. 2. GE reflux. 3. Bipolar disorder. 4. Alcoholism.  PAST SURGICAL HISTORY:  Total abdominal hysterectomy, right shoulder surgery, and knee surgery.  SOCIAL HISTORY:  The patient has an extensive history of alcohol use, drinking at least six beers per day.  She smokes about a half-pack per day for about 10 years, is unemployed.  FAMILY HISTORY:  Negative for premature CAD.  REVIEW OF SYSTEMS:  All systems reviewed, notes drinking about 5 cups of water per day.  Has had some nausea.  One loose stool per day. Otherwise, all systems reviewed and negative to the above problem except as noted above.  PHYSICAL EXAMINATION:   GENERAL:  On exam, the patient is in no acute distress.  Denies chest pain.  No shortness of breath. VITAL SIGNS:  Blood pressure on arrival 65/42, after IV fluids 101/55, pulse is 83 to 107, temperature is 97.6, O2 sat on room air is 96%. HEENT:  Normocephalic, atraumatic.  EOMI.  PERRL.  Mucous membranes are currently moist. NECK:  JVP is normal.  No bruits.  No thyromegaly. LUNGS:  Clear to auscultation without rales or wheezes. CARDIAC:  Regular rate and rhythm.  S1 and S2.  Grade 3/6 systolic murmur (holosystolic at left sternal border). ABDOMEN:  Supple, nontender.  No hepatomegaly.  No masses. EXTREMITIES:  Good distal pulses throughout.  No lower extremity edema. NEURO:  Alert and oriented x3.  Cranial nerves II-XII grossly intact. Moving all extremities.  Chest x-ray shows questionable airspace disease in the left lung.  EKG shows sinus rhythm, 84 beats per minute, left bundle-branch block.  Labs significant for hemoglobin of 13.5, WBC of 9.9.  BUN and creatinine of 24 and 1.8, potassium of 3.6, sodium of 180 on arrival, bicarb of 26. Specific gravity 1.011.  Troponin 0.56.  CK-MB of 144 and  6.2.  IMPRESSION: 1. The patient is a 60 year old who we are asked to see regarding an     abnormal troponin.  I am not convinced that the patient is having     active ischemia.  She was very hypotensive on admission which may     explain.  The CK-MB is negative. 2. Left bundle-branch block.  There is no old EKG to compare.  We     would recommend echo given her murmur.  If the LVEF is normal, we     would recommend a Lexiscan Myoview as an outpatient. 3. Murmur.  The patient has been told since she was a child that she     had a murmur.  I would recommend an echo to evaluate valves. 4. F/E/N.  Agree with hydration.  Follow electrolytes. 5. Renal.  Follow as noted.  We will continue to follow with you.     Pricilla Riffle, MD, [facility]     PVR/MEDQ  D:  05/25/2011  T:  05/26/2011   Job:  [id number]

## 2011-05-27 LAB — BASIC METABOLIC PANEL
BUN: 5 mg/dL — ABNORMAL LOW (ref 6–23)
CO2: 25 mEq/L (ref 19–32)
Calcium: 8.6 mg/dL (ref 8.4–10.5)
Creatinine, Ser: 0.75 mg/dL (ref 0.50–1.10)
GFR calc non Af Amer: >60 mL/min (ref >60)
Glucose, Bld: 107 mg/dL — ABNORMAL HIGH (ref 70–99)
Sodium: 134 mEq/L — ABNORMAL LOW (ref 135–145)

## 2011-05-27 LAB — CBC
HCT: 33.8 % — ABNORMAL LOW (ref 36.0–46.0)
Hemoglobin: 11.7 g/dL — ABNORMAL LOW (ref 12.0–15.0)
MCH: 31.2 pg (ref 26.0–34.0)
MCHC: 34.6 g/dL (ref 30.0–36.0)
MCV: 90.1 fL (ref 78.0–100.0)
RBC: 3.75 MIL/uL — ABNORMAL LOW (ref 3.87–5.11)

## 2011-05-27 NOTE — H&P (Signed)
[name], [name]                 ACCOUNT NO.:  [id number]  MEDICAL RECORD NO.:  [id number]  LOCATION:                                 FACILITY:  PHYSICIAN:  Celso Amy, MD   DATE OF BIRTH:  [birth year]  DATE OF ADMISSION: DATE OF DISCHARGE:                             HISTORY & PHYSICAL   CHIEF COMPLAINT:  Sodium level is low.  HISTORY OF PRESENT ILLNESS:  The patient is a 60 year old white female with a past medical history of alcohol abuse, who presented the ER with chief complaint of sodium level being low.  History of present illness dates back to this morning [facility] and [facility] was done, it was noted that her sodium level was low and the patient was sent to the ER.  The patient complains of diarrhea.  The patient complains of nausea.  No complaint of emesis today, but had 2 episodes yesterday.  No complaint of blood in emesis or stool.  The patient says that she had been sober for past 10 years, but from past 5 days she started drinking again.  [facility], [facility].  No complaint of chest pain or shortness of breath.  No complaint of double vision.  No complaint of change in vision.  No complaint of passing out.  ALLERGIES:  The patient says she is allergic to [facility], which causes her rash.  FAMILY HISTORY:  Positive for mother dying at the age of 60 from ethyl abuse.  Father is healthy and has no health issues.  SOCIAL HISTORY:  The patient continues to smoke.  The patient is in the detox at this time.  The patient denies illegal drug abuse.  REVIEW OF SYSTEMS:  Positive for postnasal drip.  PAST MEDICAL HISTORY:  Positive for, 1. Ethanol abuse. 2. Hypertension. 3. Status post hysterectomy. 4. Shoulder surgery.  MEDICATIONS:  As outpatient, the patient is on; 1. Bisoprolol/hydrochlorothiazide 1 tablet p.o. daily. 2. Lamictal 100 mg 1/2 tablet p.o. daily. 3. Lorazepam 1 mg p.o. tablet  t.i.d. as needed. 4. Tramadol 1 tablet p.o. q.6 h. as needed. 5. Lansoprazole 1-2 tablets p.o. b.i.d. 6. Premarin 0.3 mg p.o. daily. 7. Lisinopril 40 mg p.o. daily.  PHYSICAL EXAMINATION:  VITAL SIGNS:  Blood pressure right now was 189 to 101/40 to 50.  The patient at the time of presentation had blood pressure in low 70s/40s.  Pulse 85, respiratory rate 16, temperature afebrile. GENERAL:  The patient is awake, alert, oriented to time, place, and person, is petite, well-nourished, respond appropriately, follows commands. HEENT: Pupils equally reactive to light and accommodation.  Extraocular movements intact.  Head is atraumatic, normocephalic. RESPIRATORY:  No acute respiratory distress. CHEST:  Clear to auscultation bilaterally. CARDIOVASCULAR: S1 and S2, regular rate and rhythm.  The patient does have a systolic ejection murmur. GI: Deep bowel sounds present.  Abdomen soft, nontender, nondistended. EXTREMITIES: No lower extremity edema.  No cyanosis was seen. CNS: Cranial nerves II-XII are grossly intact.  The patient is moving all 4 extremities.  Strength is 5/5 both in upper and lower extremities. PSYCHIATRIC:  The patient is in depressed  mood when she talks about alcohol problems in her and her mother.  LABORATORY DATA:  Sodium 118, potassium is 3.6, serum chloride 83, bicarb 26, BUN is 24, serum creatinine 1.79, glucose 187.  The patient's hemoglobin is 13.5, WBC 9.9, platelets 270.  The patient's ALP 123, AST 54, ALT 61, albumin 3.4.  Ethanol level is less than 11.  Drug screen was positive for benzos.  The patient's troponins are high at 0.56.  The patient's CK 144, CK-MB 6.2, relative index 4.3.  UA shows specific gravity of 1.01, pH 6.0.  The patient's sodium has changed from 122 on July 6th to 118 on July 13th.  CT head showed no acute intracranial abnormalities.  Chest x-ray shows airspace consolidation in the middle aspect of lower lobe.  IMPRESSION: 1. Fluid,  electrolyte, and nutrition.  The patient is hyponatremic.     This hyponatremia is most likely because of multiple factors, which     include     a.     Hypovolemia.     b.     Hydrochlorothiazide.     c.     Acute kidney injury.     d.     Nausea.     e.     Pneumonia. 2. Renal.  The patient has acute kidney injury.  The patient's     creatinine has changed from 0.57 on July 6th to 1.79, this is most     likely because of hypotension plus ACE on board. 3. Social.  The patient has a history of ethanol abuse and history of     tobacco abuse. 4. Deep vein thrombosis.  We will keep the patient on deep vein     thrombosis prophylaxis. 5. Cardiovascular: The patient has left bundle-branch block on EKG and     trops are positive, but the patient does not offer any complaint of     chest pain.  Not sure whether this is leak or myocardial     infarction. 6. Respiratory: The patient's chest x-ray shows pneumonia. 7. Liver:  The patient has high LFTs expected from ethyl abuse.  PLAN: 1. We will start the patient on IV fluids for hypovolemia. 2. We will discontinue hydrochlorothiazide for hyponatremia. 3. We will follow BMET q.12 h. for next 24 hours to see the trend in     sodium. 4. Cardiac consult has been called by the ER.  We will await for the     recommendations.  We will start aspirin in    the interim. 5. We will start the patient on antibiotics for pneumonia.  The patient's further course depends on how she does with this plan.     Celso Amy, MD     MB/MEDQ  D:  05/25/2011  T:  05/25/2011  Job:  [id number]  Electronically Signed by Celso Amy M.D. on 05/27/2011 12:18:03 PM

## 2011-05-31 LAB — CULTURE, BLOOD (ROUTINE X 2)
Culture  Setup Time: 201207132341
Culture  Setup Time: 201207132341
Culture: NO GROWTH
Culture: NO GROWTH

## 2011-06-02 NOTE — Discharge Summary (Signed)
[name], [name]                 ACCOUNT NO.:  [id number]  MEDICAL RECORD NO.:  [id number]  LOCATION:  1423                         FACILITY:  [facility]  PHYSICIAN:  Kela Millin, M.D.DATE OF BIRTH:  05/26/1951  DATE OF ADMISSION:  05/25/2011 DATE OF DISCHARGE:  05/27/2011                        DISCHARGE SUMMARY - REFERRING   DISCHARGE DIAGNOSES: 1. Hyponatremia - secondary to volume depletion and     hydrochlorothiazide.  Resolved. 2. Volume depletion. 3. Acute renal failure - resolved. 4. Probable pneumonia, left lower lobe. 5. Hypotension - secondary to hypovolemia, resolved. 6. Abnormal troponins with left bundle branch block - the patient to     follow up with [facility] for [facility] as an     outpatient. 7. Alcohol dependence - the patient to be transferred back to     [facility] to continue with alcohol rehab.  PROCEDURES AND STUDIES: 1. 2-D echocardiogram on May 26, 2011 - the ejection fraction is 60%-     65%.  There was dynamic obstruction noted.  Wall motion normal.     There were no regional wall motion abnormalities.  Features     consistent with a pseudo normal left ventricular filling pattern     with concomitant abnormal relaxation and increased filling pressure     - grade 2 diastolic dysfunction.  The mitral valve showed systolic     anterior motion with mild to moderate regurgitation. 2. CT scan of the head - no acute intracranial abnormalities. 3. Chest x-ray on 7/13 - question airspace disease at the medial left     lung base.  Pneumonia is considered.  CONSULTATIONS:  Cardiology - [place], Dr. Dietrich Pates.  BRIEF HISTORY:  The patient is a 60 year old white female with the above- listed medical problems, who presented to the ED with reports of a low sodium.  It was reported that she had had lab work done at [facility] and her sodium was found to be very low and so she was sent to the ED.  She did admit to diarrhea and  complained of nausea.  No vomiting reported on the day of admission, but stated that she had vomited x2 on the day prior.  She reported that she had been sober for 10 years, but 5 days prior to been admitted to [facility] she had started drinking again.  She denied chest pain, shortness of breath, double vision, and no syncope.  She was admitted for further evaluation and management.  HOSPITAL COURSE: 1. Hyponatremia - upon admission, she was started on IV fluids for     hydration and her sodium responded well and improved to 134 today     prior to discharge (from 118 on admission).  The patient has not     had any further nausea, vomiting, or diarrhea.  It was noted that     she had been on hydrochlorothiazide and this was discontinued, and     she has been instructed to stay off the hydrochlorothiazide. 2. Volume depletion/hypotension - the patient was noted to be low in     the 70s/40s on admission and responded well to IV fluids in the ED.  The impression was that this was secondary to hypovolemia.  Her     antihypertensives were held in the hospital.  Her blood pressures     have been remaining stable and so she will be discharged on     lisinopril at the decreased dose of 20 mg and the     hydrochlorothiazide has been discontinued, she is to continue the     bisoprolol. 3. Acute renal failure - secondary to volume depletion, resolved with     hydration.  Her creatinine today prior to discharge is 0.75. 4. Abnormal troponins with left bundle branch block - the patient had     cardiac enzymes cycled on admission and her troponins came back     elevated.  A 2-D echocardiogram was done and the results are as     stated above with grade 2 diastolic dysfunction noted and an     ejection fraction of 60%-65%.  The patient did not have any     clinical evidence of volume overload on exam.  She was placed on     aspirin and is to continue her bisoprolol upon discharge.      Cardiology was consulted and Dr. Dietrich Pates saw the patient and her     impression was that the elevated cardiac enzymes was more likely     secondary to the hypotension that she had on presentation, but she     also noted that she did have left bundle branch on EKG and so she     recommended for the patient to have a Lexiscan Myoview as an     outpatient and she is to follow up at [facility] for further stress     test outpatient. 5. Probable pneumonia - the patient had a chest x-ray done on     admission, which showed a possible pneumonia in the left lower     lobe.  The patient was placed on antibiotics and she has remained     afebrile with no leukocytosis.  She will be discharged on oral     antibiotics to complete the treatment regimen. 6. Alcoholism - she was maintained on Ativan detox protocol during     this hospital stay and will be discharged back to [facility].     She did not have any signs of withdrawal while in the hospital.  DISCHARGE MEDICATIONS: 1. Avelox 400 mg p.o. daily for 5 more days. 2. Bisoprolol 5 mg p.o. daily. 3. Aspirin 81 mg p.o. daily. 4. Zyrtec 10 mg p.o. daily. 5. Folic acid 1 mg p.o. daily. 6. Multivitamins 1 p.o. daily. 7. Nicotine patch 14 mg daily. 8. Thiamine 100 mg p.o. daily. 9. Lisinopril 20 mg p.o. daily. 10.Lamictal 100 mg p.o. daily. 11.Lorazepam 1 mg p.o. t.i.d. p.r.n. 12.Premarin 0.3 mg p.o. daily. 13.Prevacid 15 mg 2 tablets in the a.m. and 1 in the p.m. as     previously. 14.Tramadol 1 p.o. q.6 h. p.r.n.  DISCONTINUED MEDICATIONS:  Hydrochlorothiazide.  FOLLOWUP CARE: 1. [facility] - Dr. Dietrich Pates for a [facility], call     [phone] for appointment this week. 2. The patient has been transferred back to [facility].  DISCHARGE CONDITION:  Improved/stable.    Kela Millin, M.D.    ACV/MEDQ  D:  05/27/2011  T:  05/27/2011  Job:  [id number]  Electronically Signed by Donnalee Curry M.D. on 06/02/2011  09:54:47 PM

## 2011-07-06 ENCOUNTER — Encounter: Payer: Self-pay | Admitting: Internal Medicine

## 2011-07-09 ENCOUNTER — Ambulatory Visit: Payer: BC Managed Care – PPO | Admitting: Internal Medicine

## 2011-08-01 ENCOUNTER — Encounter: Payer: Self-pay | Admitting: Internal Medicine

## 2012-09-16 ENCOUNTER — Encounter: Payer: Self-pay | Admitting: Family Medicine

## 2012-09-16 ENCOUNTER — Ambulatory Visit (INDEPENDENT_AMBULATORY_CARE_PROVIDER_SITE_OTHER): Payer: BC Managed Care – PPO | Admitting: Family Medicine

## 2012-09-16 VITALS — BP 118/70 | HR 66 | Temp 98.3°F | Ht 62.0 in | Wt 138.0 lb

## 2012-09-16 DIAGNOSIS — Z720 Tobacco use: Secondary | ICD-10-CM

## 2012-09-16 DIAGNOSIS — I10 Essential (primary) hypertension: Secondary | ICD-10-CM | POA: Insufficient documentation

## 2012-09-16 DIAGNOSIS — Z13220 Encounter for screening for lipoid disorders: Secondary | ICD-10-CM

## 2012-09-16 DIAGNOSIS — Z131 Encounter for screening for diabetes mellitus: Secondary | ICD-10-CM

## 2012-09-16 DIAGNOSIS — F17200 Nicotine dependence, unspecified, uncomplicated: Secondary | ICD-10-CM

## 2012-09-16 DIAGNOSIS — K219 Gastro-esophageal reflux disease without esophagitis: Secondary | ICD-10-CM | POA: Insufficient documentation

## 2012-09-16 DIAGNOSIS — F319 Bipolar disorder, unspecified: Secondary | ICD-10-CM

## 2012-09-16 DIAGNOSIS — F419 Anxiety disorder, unspecified: Secondary | ICD-10-CM | POA: Insufficient documentation

## 2012-09-16 DIAGNOSIS — R1012 Left upper quadrant pain: Secondary | ICD-10-CM

## 2012-09-16 LAB — COMPREHENSIVE METABOLIC PANEL
Albumin: 4 g/dL (ref 3.5–5.2)
BUN: 11 mg/dL (ref 6–23)
CO2: 32 mEq/L (ref 19–32)
GFR: 74.76 mL/min (ref >60.00)
Glucose, Bld: 91 mg/dL (ref 70–99)
Potassium: 3.9 mEq/L (ref 3.5–5.1)
Sodium: 137 mEq/L (ref 135–145)
Total Bilirubin: 0.3 mg/dL (ref 0.3–1.2)
Total Protein: 7.1 g/dL (ref 6.0–8.3)

## 2012-09-16 LAB — CBC WITH DIFFERENTIAL/PLATELET
Basophils Absolute: 0.1 10*3/uL (ref 0.0–0.1)
Basophils Relative: 1 % (ref 0.0–3.0)
Eosinophils Absolute: 0.2 10*3/uL (ref 0.0–0.7)
Lymphocytes Relative: 40.9 % (ref 12.0–46.0)
MCHC: 32.2 g/dL (ref 30.0–36.0)
MCV: 91.7 fl (ref 78.0–100.0)
Monocytes Absolute: 0.7 10*3/uL (ref 0.1–1.0)
Neutrophils Relative %: 49.1 % (ref 43.0–77.0)
RBC: 4.74 Mil/uL (ref 3.87–5.11)
RDW: 13.9 % (ref 11.5–14.6)

## 2012-09-16 LAB — LIPID PANEL
HDL: 55.9 mg/dL (ref >39.00)
Triglycerides: 199 mg/dL — ABNORMAL HIGH (ref 0.0–149.0)

## 2012-09-16 LAB — LDL CHOLESTEROL, DIRECT: Direct LDL: 184.5 mg/dL

## 2012-09-16 LAB — HEMOGLOBIN A1C: Hgb A1c MFr Bld: 6.3 % (ref 4.6–6.5)

## 2012-09-16 MED ORDER — BISOPROLOL-HYDROCHLOROTHIAZIDE 5-6.25 MG PO TABS: 1.0000 | ORAL_TABLET | Freq: Every day | ORAL | Status: DC

## 2012-09-16 NOTE — Progress Notes (Signed)
Chief Complaint  Patient presents with  . Establish Care    HPI:  [name] [facility] is here to establish care. Many doctors left her prior PCP office and she can never get in there.Works at [employer].  Has the following concerns today:  LUQ pain and acid reflux: -has had for a number of years, but worse last 3-4 months with increased stress -has intermittent pain in LUQ that she describes as gas that only occurs when she eats certain things such as ham or eggs, does not occur with activity -she also has continued intermittent reflux symptoms despite PPI therapy with occ heartburn and reflux with burning in throat sometimes -has hx of gastric ulcers with severe anemia about 5 years ago and was followed by a GI doctor in [place] ad had EGD/colon and treated -has been on lansoprazole since (30mg  in am and 15mg  in pm) -Denies: fevers, chills, malaise, nausea, vomiting, change in bowels, constipation, hematochezia, melena, weight loss  Chronic Problems: Patient Active Problem List  Diagnosis  . Hypertension/CHF -CHF listed on problem list, but pt denies this, reports has heart murmur - she reports echo in the past that she was told was ok -reports seeing cards in the past for heart murmur and had a big work up and was told was fine -takes bisoprolol-HCTZ 5-6.25 and lisinopril 40mg  -denies: HA, vision changes, CP, SOB, swelling, palpitations -very active and never has any SOB of CP or symptoms in the past with activity  . Bipolar affective disorder/anxiety -on buspar 15 mg daily -uses lorazepam very sparingly a few times per year   Other Providers:  Health Maintenance: -complete hysterectomy for fibroids -last mammo 5 years ago -had colonoscopy five years ago -diet could improve, no regular exercise  ROS: See pertinent positives and negatives per HPI.  Past Medical History  Diagnosis Date  . CHF (congestive heart failure)   . Bipolar 1 disorder   . GERD (gastroesophageal reflux  disease)   . Hypertension   . Acid reflux   . Bipolar 2 disorder   . Ulcer   . Heart murmur     Family History  Problem Relation Age of Onset  . Prostate cancer Father     History   Social History  . Marital Status: Single    Spouse Name: N/A    Number of Children: N/A  . Years of Education: N/A   Social History Main Topics  . Smoking status: Current Every Day Smoker    Types: Cigarettes  . Smokeless tobacco: None     Comment: almost a pack a day;   . Alcohol Use: No  . Drug Use: None  . Sexually Active: None   Other Topics Concern  . None   Social History Narrative   She does have BS degree from [employer]. 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 She is very  Much afraid of losing her job and is very concerned about this. She has supportive friends and ex- sponsors    Current outpatient prescriptions:bisoprolol-hydrochlorothiazide (ZIAC) 5-6.25 MG per tablet, Take 1 tablet by mouth daily., Disp: 90 tablet, Rfl: 3;  busPIRone (BUSPAR) 15 MG tablet, Take 15 mg by mouth 3 (three) times daily.  , Disp: , Rfl: ;  lansoprazole (PREVACID) 30 MG capsule, Take 30 mg by mouth. Take 2 in the morning and one at night, Disp: , Rfl: ;  lisinopril (PRINIVIL,ZESTRIL) 40 MG tablet, Take 40 mg by mouth daily.  , Disp: ,  Rfl:  [DISCONTINUED] bisoprolol-hydrochlorothiazide (ZIAC) 5-6.25 MG per tablet, Take 1 tablet by mouth daily.  , Disp: , Rfl: ;  estrogens, conjugated, (PREMARIN) 0.3 MG tablet, Take 0.3 mg by mouth daily. Take daily for 21 days then do not take for 7 days. , Disp: , Rfl: ;  lamoTRIgine (LAMICTAL) 100 MG tablet, Take 100 mg by mouth daily.  , Disp: , Rfl:  LORazepam (ATIVAN) 1 MG tablet, Take 0.5-1 mg by mouth every 8 (eight) hours as needed., Disp: , Rfl:   EXAM:  Filed Vitals:   09/16/12 1446  BP: 118/70  Pulse: 66  Temp: 98.3 F (36.8 C)    Body mass index is 25.24 kg/(m^2).  GENERAL: vitals reviewed and listed above, alert, oriented,  appears well hydrated and in no acute distress  HEENT: atraumatic, conjunttiva clear, no obvious abnormalities on inspection of external nose and ears  NECK: no obvious masses on inspection  LUNGS: clear to auscultation bilaterally, no wheezes, rales or rhonchi, good air movement  CV: HRRR, SEM, no peripheral edema  ABD: BS+, soft, NTTP  MS: moves all extremities without noticeable abnormality  PSYCH: pleasant and cooperative, no obvious depression or anxiety  ASSESSMENT AND PLAN:  Discussed the following assessment and plan:  1. Hypertension  -stable CMP, refilled medications, no signs or symptoms of heart failure and pt reports this is a mistake in medical record - will get EKG at physical exam  2. GERD (gastroesophageal reflux disease)  -uncontrolled on PPI CBC with Differential Ambulatory referral to Gastroenterology  3. Bipolar affective disorder  -stable Continue current managment  4. LUQ pain  -no alarming urgent symptoms -symptoms in the past related to GERD -no symptoms to suggest cardiac or pulmonary etiology CMP, CBC, Ambulatory referral to Gastroenterology  5. Screening for diabetes mellitus  Hemoglobin A1c  6. Screening for hyperlipidemia  Lipid Panel  7. Tobacco Use, will address further at next visit, QUITLINE info given  -We reviewed the PMH, PSH, FH, SH, Meds and Allergies. -We provided refills for any medications we will prescribe as needed. -We addressed current concerns per orders and patient instructions. -We have asked for records for pertinent exams, studies, vaccines and notes from previous providers. -We have advised patient to follow up per instructions below. -Influenza vaccine refused -pt to schedule mammogram -will follow up in 1 month for CPE, will address smoking further at that visit  -Patient advised to return or notify a doctor immediately if symptoms worsen or persist or new concerns arise.  Patient Instructions  -We have ordered labs  or studies at this visit. It can take up to 1-2 weeks for results and processing. We will contact you with instructions IF your results are abnormal. Normal results will be released to your [facility]. If you have not heard from [place] or can not find your results in [facility] in 2 weeks please contact our office.  -We placed a referral for you as discussed. It usually takes about 1-2 weeks to process and schedule this referral. If you have not heard from [place] regarding this appointment in 2 weeks please contact our office.   -PLEASE SIGN UP FOR MYCHART TODAY   We recommend the following healthy lifestyle measures: - eat a healthy diet consisting of lots of vegetables, fruits, beans, nuts, seeds, healthy meats such as white chicken and fish and whole grains.  - avoid fried foods, fast food, processed foods, sodas, red meet and other fattening foods.  - get a least 150 minutes of  aerobic exercise per week.   Follow up in: 1- 2 months for physical exam       Shamarion Coots R.

## 2012-09-16 NOTE — Patient Instructions (Addendum)
-  We have ordered labs or studies at this visit. It can take up to 1-2 weeks for results and processing. We will contact you with instructions IF your results are abnormal. Normal results will be released to your [facility]. If you have not heard from [place] or can not find your results in [facility] in 2 weeks please contact our office.  -We placed a referral for you as discussed. It usually takes about 1-2 weeks to process and schedule this referral. If you have not heard from [place] regarding this appointment in 2 weeks please contact our office.   -PLEASE SIGN UP FOR MYCHART TODAY   We recommend the following healthy lifestyle measures: - eat a healthy diet consisting of lots of vegetables, fruits, beans, nuts, seeds, healthy meats such as white chicken and fish and whole grains.  - avoid fried foods, fast food, processed foods, sodas, red meet and other fattening foods.  - get a least 150 minutes of aerobic exercise per week.   Follow up in: 1- 2 months for physical exam

## 2012-09-17 ENCOUNTER — Telehealth: Payer: Self-pay | Admitting: Family Medicine

## 2012-09-17 ENCOUNTER — Other Ambulatory Visit: Payer: Self-pay | Admitting: Family Medicine

## 2012-09-17 ENCOUNTER — Encounter: Payer: Self-pay | Admitting: Internal Medicine

## 2012-09-17 DIAGNOSIS — Z1231 Encounter for screening mammogram for malignant neoplasm of breast: Secondary | ICD-10-CM

## 2012-09-17 NOTE — Telephone Encounter (Signed)
Called and spoke with pt about lab work.  Pt is aware.

## 2012-09-17 NOTE — Telephone Encounter (Signed)
Please let patient know, since not yet signed up for mychart, wanted to contact regarding lab results. Recommend signing up for mychart to see these results in about 10 days.  -cholesterol is very high -diabetes screening lab is a little high (>5.6) indicating a risk for developing diabetes  The best treatment to hopefully reverse these findings and prevent adverse health outcomes is a healthy diet and regular exercise.  She needs an appointment in 1 month for a CPE. Please schedule am appointment and come fasting. We will recheck lipids then and if still this high will need to discuss treatment.

## 2012-09-17 NOTE — Telephone Encounter (Signed)
Left a message for pt to return call 

## 2012-09-17 NOTE — Telephone Encounter (Signed)
Pt request a refill on buspar.  Can I send to pharmacy.

## 2012-09-18 MED ORDER — BUSPIRONE HCL 15 MG PO TABS: 15.0000 mg | ORAL_TABLET | Freq: Three times a day (TID) | ORAL | Status: DC

## 2012-09-18 NOTE — Telephone Encounter (Signed)
[name],  Make sure she schedules follow up (CPE) and can give her refill for #90 (no refills).

## 2012-09-18 NOTE — Addendum Note (Signed)
Addended by: Azucena Freed on: 09/18/2012 03:55 PM   Modules accepted: Orders

## 2012-09-18 NOTE — Telephone Encounter (Signed)
Rx sent to pharmacy for buspar #90 with 0 rf.  Pt is aware to call back to set up CPE.

## 2012-10-08 ENCOUNTER — Encounter: Payer: Self-pay | Admitting: Internal Medicine

## 2012-10-20 ENCOUNTER — Ambulatory Visit (INDEPENDENT_AMBULATORY_CARE_PROVIDER_SITE_OTHER): Payer: BC Managed Care – PPO | Admitting: Internal Medicine

## 2012-10-20 ENCOUNTER — Encounter: Payer: Self-pay | Admitting: Internal Medicine

## 2012-10-20 VITALS — BP 92/60 | HR 60 | Ht 61.5 in | Wt 135.6 lb

## 2012-10-20 DIAGNOSIS — Z1211 Encounter for screening for malignant neoplasm of colon: Secondary | ICD-10-CM

## 2012-10-20 DIAGNOSIS — R1013 Epigastric pain: Secondary | ICD-10-CM

## 2012-10-20 DIAGNOSIS — L290 Pruritus ani: Secondary | ICD-10-CM

## 2012-10-20 DIAGNOSIS — Z720 Tobacco use: Secondary | ICD-10-CM

## 2012-10-20 DIAGNOSIS — K219 Gastro-esophageal reflux disease without esophagitis: Secondary | ICD-10-CM

## 2012-10-20 DIAGNOSIS — F17200 Nicotine dependence, unspecified, uncomplicated: Secondary | ICD-10-CM

## 2012-10-20 MED ORDER — DEXLANSOPRAZOLE 60 MG PO CPDR: 60.0000 mg | DELAYED_RELEASE_CAPSULE | Freq: Every day | ORAL | Status: DC

## 2012-10-20 MED ORDER — FAMOTIDINE 40 MG PO TABS: 40.0000 mg | ORAL_TABLET | Freq: Every evening | ORAL | Status: DC

## 2012-10-20 NOTE — Patient Instructions (Addendum)
You have been given a separate informational sheet regarding your tobacco use, the importance of quitting and local resources to help you quit.  You have been scheduled for an endoscopy with propofol. Please follow written instructions given to you at your visit today. If you use inhalers (even only as needed) or a CPAP machine, please bring them with you on the day of your procedure.  We have sent the following medications to your pharmacy for you to pick up at your convenience: Dexilant 60 mg.  Dr, Rhea Belton recommends taking Pepcid OTC at nighttime for breakthrough heartburn.  You can use balmex OTC Perianally as needed for pain

## 2012-10-20 NOTE — Progress Notes (Addendum)
Patient ID: [name], female   DOB: December 08, 1950, 61 y.o.   MRN: [id number]  SUBJECTIVE: HPI [name] is a 61 yo female with PMH of GERD, PUD, CHF, HTN, heart murmur who is seen in consultation for epigastric, pain and refractory heartburn. The patient is alone today. She states she's had a long-standing history of acid reflux disease, but over the last several weeks to months this has been worse. Does recently note increased stress and wonders if this is playing a part. She notes heartburn and water brash symptoms. She also has a vague epigastric and left upper quadrant discomfort which is sometimes worse with eating. She does frequently report borborygmi and occasional bloating. She reports normal bowel pattern without diarrhea or constipation. No blood in her stool nor melena. She's been using lansoprazole 30 mg in the morning and 15 mg in the evening. She is still needing to use apple cider vinegar for heartburn symptoms. She does report ongoing tobacco use, denies alcohol use, and drinks caffeine daily. She does report occasional perianal burning with bowel movements but no bleeding.  She has a history of upper endoscopy and colonoscopy about 5 years ago performed in [place]. She recalls a normal colonoscopy and some "ulcers" from the EGD. We have requested these records  Review of Systems  As per history of present illness, otherwise negative   Past Medical History  Diagnosis Date  . CHF (congestive heart failure)   . Bipolar 1 disorder   . GERD (gastroesophageal reflux disease)   . Hypertension   . Acid reflux   . Bipolar 2 disorder   . Ulcer   . Heart murmur     Current Outpatient Prescriptions  Medication Sig Dispense Refill  . bisoprolol-hydrochlorothiazide (ZIAC) 5-6.25 MG per tablet Take 1 tablet by mouth daily.  90 tablet  3  . busPIRone (BUSPAR) 15 MG tablet Take 1 tablet (15 mg total) by mouth 3 (three) times daily.  90 tablet  0  . estrogens, conjugated, (PREMARIN)  0.3 MG tablet Take 0.3 mg by mouth daily. Take daily for 21 days then do not take for 7 days.       Marland Kitchen lamoTRIgine (LAMICTAL) 100 MG tablet Take 100 mg by mouth daily.        Marland Kitchen lisinopril (PRINIVIL,ZESTRIL) 40 MG tablet Take 40 mg by mouth daily.        Marland Kitchen LORazepam (ATIVAN) 1 MG tablet Take 0.5-1 mg by mouth every 8 (eight) hours as needed.      Marland Kitchen dexlansoprazole (DEXILANT) 60 MG capsule Take 1 capsule (60 mg total) by mouth daily.  90 capsule  3  . famotidine (PEPCID) 40 MG tablet Take 1 tablet (40 mg total) by mouth every evening.  30 tablet  1    Allergies  Allergen Reactions  . Iodine   . Shellfish Allergy     Family History  Problem Relation Age of Onset  . Prostate cancer Father   . Colon cancer Maternal Grandmother     History  Substance Use Topics  . Smoking status: Current Every Day Smoker    Types: Cigarettes  . Smokeless tobacco: Never Used     Comment: almost a pack a day;   . Alcohol Use: No    OBJECTIVE: BP 92/60  Pulse 60  Ht 5' 1.5" (1.562 m)  Wt 135 lb 9.6 oz (61.508 kg)  BMI 25.21 kg/m2 Constitutional: Well-developed and well-nourished. No distress. HEENT: Normocephalic and atraumatic. Oropharynx is clear and moist. No  oropharyngeal exudate. Conjunctivae are normal. No scleral icterus. Neck: Neck supple. Trachea midline. Cardiovascular: Normal rate, regular rhythm and intact distal pulses. 2/6 systolic ejection murmur Pulmonary/chest: Effort normal and breath sounds normal. No wheezing, rales or rhonchi. Abdominal: Soft, nontender, nondistended. Bowel sounds active throughout. There are no masses palpable. No hepatosplenomegaly. Extremities: no clubbing, cyanosis, or edema Lymphadenopathy: No cervical adenopathy noted. Neurological: Alert and oriented to person place and time. Skin: Skin is warm and dry. No rashes noted. Psychiatric: Normal mood and affect. Behavior is normal.  Labs and Imaging -- CBC    Component Value Date/Time   WBC 9.3 09/16/2012  1523   RBC 4.74 09/16/2012 1523   HGB 14.0 09/16/2012 1523   HCT 43.5 09/16/2012 1523   PLT 342.0 09/16/2012 1523   MCV 91.7 09/16/2012 1523   MCH 31.2 05/27/2011 0535   MCHC 32.2 09/16/2012 1523   RDW 13.9 09/16/2012 1523   LYMPHSABS 3.8 09/16/2012 1523   MONOABS 0.7 09/16/2012 1523   EOSABS 0.2 09/16/2012 1523   BASOSABS 0.1 09/16/2012 1523    CMP     Component Value Date/Time   NA 137 09/16/2012 1523   K 3.9 09/16/2012 1523   CL 98 09/16/2012 1523   CO2 32 09/16/2012 1523   GLUCOSE 91 09/16/2012 1523   BUN 11 09/16/2012 1523   CREATININE 0.8 09/16/2012 1523   CALCIUM 9.3 09/16/2012 1523   PROT 7.1 09/16/2012 1523   ALBUMIN 4.0 09/16/2012 1523   AST 20 09/16/2012 1523   ALT 15 09/16/2012 1523   ALKPHOS 99 09/16/2012 1523   BILITOT 0.3 09/16/2012 1523   GFRNONAA >60 05/27/2011 0535   GFRAA >60 05/27/2011 0535    ASSESSMENT AND PLAN: 61 yo female with PMH of GERD, PUD, CHF, HTN, heart murmur who is seen in consultation for epigastric, pain and refractory heartburn.   1. Refractory heartburn/epigastric abdominal pain -- given the patient's ongoing symptoms despite twice a day PPI therapy, I recommended visualization with upper endoscopy. We discussed the test today including the risks and benefits and she is agreeable to proceed. So like to discontinue lansoprazole and start Dexilant 60 mg daily. Hopefully the longer acting nature of this medication will improve her symptoms overall. If she has breakthrough nocturnal symptoms, I recommended famotidine 20 mg on an as-needed basis.  Avoidance of tobacco and caffeine also may improve her reflux symptoms.  2.  Perianal burning -- I recommended over-the-counter Balmex cream twice a day for this irritation.  3. CRC screening -- the patient is average risk from a colorectal cancer screening standpoint. We will obtain prior colonoscopy records and set colonoscopy screening interval based on this report. If it is normal as she recalls, she would be due repeat  10 years from her first exam   Addendum: Records received: Colonoscopy dated 05/09/2006 - colonoscopy to the terminal ileum, quality of prep was good. Impression: Essentially unremarkable colonoscopic examination to the terminal ileum (Dr. Marijo Sanes)  -- Given this, I recommend repeat surveillance/screening colonoscopy 10 years after her initial examination which would be July 2017.  Her family history of colon cancer is in one second degree relative, which would not shorten her interval based on current guidelines

## 2012-10-24 ENCOUNTER — Other Ambulatory Visit: Payer: Self-pay | Admitting: Family Medicine

## 2012-10-24 NOTE — Telephone Encounter (Signed)
Pt states she is going out of town the week of Dec 16-20th and cannot come in.  Pt states her schedule for work is posted 3 weeks in advance and pt will have to call back after Christmas to schedule an appt.

## 2012-10-24 NOTE — Telephone Encounter (Signed)
Called pt to get more information.  Pt states she is having anxiety and she had a couple of incidents happening.  Pt states her head is hurting and her heart is beating out of her chest.  Pt states her bp today is 130/90.  Pt states she is having a rough time with the christmas time and working at [employer].  Pt states she understands Dr. Selena Batten does not usually prescribe this medication but pt needs something to help her thorough this time.

## 2012-10-24 NOTE — Telephone Encounter (Signed)
Advised office visit to evaluate.

## 2012-10-24 NOTE — Telephone Encounter (Signed)
Called and spoke with pt and pt states she will call back to set up appt.

## 2012-10-24 NOTE — Telephone Encounter (Signed)
Pt would like MD to refill her LORazepam 1mg .  Pt not requesting a 30 day supply, maybe a few to help her through this stressful time.  [facility] [place] [phone]

## 2012-10-29 ENCOUNTER — Ambulatory Visit: Payer: BC Managed Care – PPO

## 2012-11-07 ENCOUNTER — Telehealth: Payer: Self-pay | Admitting: Internal Medicine

## 2012-11-07 DIAGNOSIS — R1013 Epigastric pain: Secondary | ICD-10-CM

## 2012-11-07 DIAGNOSIS — K219 Gastro-esophageal reflux disease without esophagitis: Secondary | ICD-10-CM

## 2012-11-07 MED ORDER — DEXLANSOPRAZOLE 60 MG PO CPDR: 60.0000 mg | DELAYED_RELEASE_CAPSULE | Freq: Every day | ORAL | Status: DC

## 2012-11-07 NOTE — Telephone Encounter (Signed)
lvm for pt to call me back. Sent in Rx for Dexilant to her Pharmacy.

## 2012-11-17 ENCOUNTER — Ambulatory Visit (AMBULATORY_SURGERY_CENTER): Payer: BC Managed Care – PPO | Admitting: Internal Medicine

## 2012-11-17 ENCOUNTER — Encounter: Payer: Self-pay | Admitting: Internal Medicine

## 2012-11-17 VITALS — BP 120/77 | HR 56 | Temp 98.1°F | Resp 28 | Ht 61.5 in | Wt 135.0 lb

## 2012-11-17 DIAGNOSIS — R1013 Epigastric pain: Secondary | ICD-10-CM

## 2012-11-17 DIAGNOSIS — K219 Gastro-esophageal reflux disease without esophagitis: Secondary | ICD-10-CM

## 2012-11-17 MED ORDER — SODIUM CHLORIDE 0.9 % IV SOLN: 500.0000 mL | INTRAVENOUS | Status: DC

## 2012-11-17 NOTE — Patient Instructions (Addendum)
YOU HAD AN ENDOSCOPIC PROCEDURE TODAY AT THE  ENDOSCOPY CENTER: Refer to the procedure report that was given to you for any specific questions about what was found during the examination.  If the procedure report does not answer your questions, please call your gastroenterologist to clarify.  If you requested that your care partner not be given the details of your procedure findings, then the procedure report has been included in a sealed envelope for you to review at your convenience later.  YOU SHOULD EXPECT: Some feelings of bloating in the abdomen. Passage of more gas than usual.  Walking can help get rid of the air that was put into your GI tract during the procedure and reduce the bloating. If you had a lower endoscopy (such as a colonoscopy or flexible sigmoidoscopy) you may notice spotting of blood in your stool or on the toilet paper. If you underwent a bowel prep for your procedure, then you may not have a normal bowel movement for a few days.  DIET: Your first meal following the procedure should be a light meal and then it is ok to progress to your normal diet.  A half-sandwich or bowl of soup is an example of a good first meal.  Heavy or fried foods are harder to digest and may make you feel nauseous or bloated.  Likewise meals heavy in dairy and vegetables can cause extra gas to form and this can also increase the bloating.  Drink plenty of fluids but you should avoid alcoholic beverages for 24 hours.  ACTIVITY: Your care partner should take you home directly after the procedure.  You should plan to take it easy, moving slowly for the rest of the day.  You can resume normal activity the day after the procedure however you should NOT DRIVE or use heavy machinery for 24 hours (because of the sedation medicines used during the test).    SYMPTOMS TO REPORT IMMEDIATELY: A gastroenterologist can be reached at any hour.  During normal business hours, 8:30 AM to 5:00 PM Monday through Friday,  call [phone].  After hours and on weekends, please call the GI answering service at [phone] who will take a message and have the physician on call contact you.   Following upper endoscopy (EGD)  Vomiting of blood or coffee ground material  New chest pain or pain under the shoulder blades  Painful or persistently difficult swallowing  New shortness of breath  Fever of 100F or higher  Black, tarry-looking stools  BE SURE TO TAKE YOUR DEXILANT EVERY MORNING.  FOLLOW UP: If any biopsies were taken you will be contacted by phone or by letter within the next 1-3 weeks.  Call your gastroenterologist if you have not heard about the biopsies in 3 weeks.  Our staff will call the home number listed on your records the next business day following your procedure to check on you and address any questions or concerns that you may have at that time regarding the information given to you following your procedure. This is a courtesy call and so if there is no answer at the home number and we have not heard from you through the emergency physician on call, we will assume that you have returned to your regular daily activities without incident.  SIGNATURES/CONFIDENTIALITY: You and/or your care partner have signed paperwork which will be entered into your electronic medical record.  These signatures attest to the fact that that the information above on your After Visit Summary  has been reviewed and is understood.  Full responsibility of the confidentiality of this discharge information lies with you and/or your care-partner.   Thank-you for choosing [place] for your healthcare needs.

## 2012-11-17 NOTE — Progress Notes (Addendum)
Patient did not have preoperative order for IV antibiotic SSI prophylaxis. (G8918)  Patient did not experience any of the following events: a burn prior to discharge; a fall within the facility; wrong site/side/patient/procedure/implant event; or a hospital transfer or hospital admission upon discharge from the facility. (G8907)  

## 2012-11-17 NOTE — Op Note (Signed)
[facility] [address] [place], [address]   ENDOSCOPY PROCEDURE REPORT  PATIENT: [name], [name]  MR#: [id number] BIRTHDATE: [birth year] , 61  yrs. old GENDER: Female ENDOSCOPIST: Beverley Fiedler, MD PROCEDURE DATE:  11/17/2012 PROCEDURE:  EGD, diagnostic ASA CLASS:     Class II INDICATIONS:  Epigastric pain.   Heartburn. MEDICATIONS: MAC sedation, administered by CRNA and propofol (Diprivan) 200mg  IV TOPICAL ANESTHETIC: Cetacaine Spray  DESCRIPTION OF PROCEDURE: After the risks benefits and alternatives of the procedure were thoroughly explained, informed consent was obtained.  The [facility] [id number] endoscope was introduced through the mouth and advanced to the second portion of the duodenum. Without limitations.  The instrument was slowly withdrawn as the mucosa was fully examined.     ESOPHAGUS: A mild, non-obstructing Schatzki ring was found 30 cm from the incisors.   A large, 8 cm, hiatal hernia was noted. No evidence of Cameron's lesions.  STOMACH: The mucosa of the stomach appeared normal.  DUODENUM: The duodenal mucosa showed no abnormalities in the bulb and second portion of the duodenum. Retroflexed views revealed a hiatal hernia.     The scope was then withdrawn from the patient and the procedure completed.  COMPLICATIONS: There were no complications. ENDOSCOPIC IMPRESSION: 1.   Schatzki ring was found 30 cm from the incisors 2.   Large hiatal hernia, 8 cm 3.   The mucosa of the stomach appeared normal 4.   The duodenal mucosa showed no abnormalities in the bulb and second portion of the duodenum  RECOMMENDATIONS: 1.  Continue Dexilant 60 mg daily 2.  Anti-reflux regimen to be followed 3.  Hiatus hernia repair could be considered if reflux symptoms become refractory (currently good control of symptoms with recent medication change)  eSigned:  Beverley Fiedler, MD 11/17/2012 10:34 AM CC:The Patient and Kriste Basque

## 2012-11-18 ENCOUNTER — Telehealth: Payer: Self-pay | Admitting: *Deleted

## 2012-11-18 DIAGNOSIS — Z1211 Encounter for screening for malignant neoplasm of colon: Secondary | ICD-10-CM | POA: Insufficient documentation

## 2012-11-18 NOTE — Telephone Encounter (Signed)
  Follow up Call-  Call back number 11/17/2012  Post procedure Call Back phone  # [phone]  Permission to leave phone message Yes     Patient questions:  Do you have a fever, pain , or abdominal swelling? no Pain Score  0 *  Have you tolerated food without any problems? yes  Have you been able to return to your normal activities? yes  Do you have any questions about your discharge instructions: Diet   no Medications  no Follow up visit  no  Do you have questions or concerns about your Care? no  Actions: * If pain score is 4 or above: No action needed, pain <4.

## 2012-12-04 ENCOUNTER — Telehealth: Payer: Self-pay | Admitting: Family Medicine

## 2012-12-04 ENCOUNTER — Ambulatory Visit
Admission: RE | Admit: 2012-12-04 | Discharge: 2012-12-04 | Disposition: A | Discharge status: Home / Self Care | Payer: BC Managed Care – PPO | Source: Ambulatory Visit | Attending: Family Medicine | Admitting: Family Medicine

## 2012-12-04 DIAGNOSIS — Z1231 Encounter for screening mammogram for malignant neoplasm of breast: Secondary | ICD-10-CM

## 2012-12-04 NOTE — Telephone Encounter (Signed)
Please call her about the mammogram findings. Per report, results discussed with patient and per report pt will be scheduled for diagnostic mammogram to furhter evaluate. Please contact breast center and patient ensure this has been done and see if I need to place any orders.

## 2012-12-05 ENCOUNTER — Other Ambulatory Visit: Payer: Self-pay | Admitting: Family Medicine

## 2012-12-05 DIAGNOSIS — R928 Other abnormal and inconclusive findings on diagnostic imaging of breast: Secondary | ICD-10-CM

## 2012-12-05 NOTE — Telephone Encounter (Signed)
Called the Breast Center and spoke with [facility] and she states the patient had not been contacted yet but once she does and the order is ordered the patient will be scheduled and a copy will be sent to Dr. Selena Batten to sign off on.

## 2012-12-05 NOTE — Telephone Encounter (Signed)
Called and spoke with pt and pt states she had not heard from the Breast Center.  Will call the Breast Center to follow up.

## 2012-12-17 ENCOUNTER — Other Ambulatory Visit: Payer: Self-pay | Admitting: Family Medicine

## 2012-12-17 MED ORDER — BUSPIRONE HCL 15 MG PO TABS: 15.0000 mg | ORAL_TABLET | Freq: Three times a day (TID) | ORAL | Status: DC

## 2012-12-17 NOTE — Telephone Encounter (Signed)
Pt needs refill of  busPIRone (BUSPAR) 15 MG tablet. Pt has called pharm several times.

## 2012-12-17 NOTE — Addendum Note (Signed)
Addended by: Azucena Freed on: 12/17/2012 01:38 PM   Modules accepted: Orders

## 2012-12-17 NOTE — Telephone Encounter (Signed)
Ok to refil for 1 month. Was to follow up in one month after last visit so needs appt. For CPE prior to rx.

## 2012-12-17 NOTE — Telephone Encounter (Signed)
Pls advise if rx can be sent to pharmacy.

## 2012-12-17 NOTE — Telephone Encounter (Signed)
Called and spoke with pt and pt is aware and states she will schedule an appt.  Rx sent to pharmacy.

## 2012-12-19 ENCOUNTER — Ambulatory Visit
Admission: RE | Admit: 2012-12-19 | Discharge: 2012-12-19 | Disposition: A | Discharge status: Home / Self Care | Payer: BC Managed Care – PPO | Source: Ambulatory Visit | Attending: Family Medicine | Admitting: Family Medicine

## 2012-12-19 DIAGNOSIS — R928 Other abnormal and inconclusive findings on diagnostic imaging of breast: Secondary | ICD-10-CM

## 2012-12-31 ENCOUNTER — Other Ambulatory Visit: Payer: Self-pay

## 2012-12-31 MED ORDER — LISINOPRIL 40 MG PO TABS: 40.0000 mg | ORAL_TABLET | Freq: Every day | ORAL | Status: DC

## 2013-02-25 ENCOUNTER — Encounter: Payer: Self-pay | Admitting: Family Medicine

## 2013-02-25 ENCOUNTER — Ambulatory Visit (INDEPENDENT_AMBULATORY_CARE_PROVIDER_SITE_OTHER): Payer: BC Managed Care – PPO | Admitting: Family Medicine

## 2013-02-25 VITALS — BP 120/80 | HR 62 | Temp 98.6°F | Wt 138.0 lb

## 2013-02-25 DIAGNOSIS — J329 Chronic sinusitis, unspecified: Secondary | ICD-10-CM

## 2013-02-25 DIAGNOSIS — E785 Hyperlipidemia, unspecified: Secondary | ICD-10-CM

## 2013-02-25 DIAGNOSIS — S90859A Superficial foreign body, unspecified foot, initial encounter: Secondary | ICD-10-CM

## 2013-02-25 DIAGNOSIS — F17200 Nicotine dependence, unspecified, uncomplicated: Secondary | ICD-10-CM

## 2013-02-25 DIAGNOSIS — IMO0002 Reserved for concepts with insufficient information to code with codable children: Secondary | ICD-10-CM

## 2013-02-25 DIAGNOSIS — Z720 Tobacco use: Secondary | ICD-10-CM

## 2013-02-25 MED ORDER — GUAIFENESIN-CODEINE 100-10 MG/5ML PO SYRP: 5.0000 mL | ORAL_SOLUTION | Freq: Every evening | ORAL | Status: DC | PRN

## 2013-02-25 MED ORDER — AMOXICILLIN 875 MG PO TABS: 875.0000 mg | ORAL_TABLET | Freq: Two times a day (BID) | ORAL | Status: DC

## 2013-02-25 NOTE — Progress Notes (Signed)
Chief Complaint  Patient presents with  . Sinusitis    mucus yellow; worse at night, drainage, cant sleep , facial pain and pressure and dizziness     HPI: Sinus issues: -started: 3 weeks -symptoms:nasal congestion, sore throat, cough, sinus pain and pressure -denies:fever, SOB, NVD, tooth pain -has tried: takes zyrtec daily -sick contacts: none known -Hx of: allergic rhinitis   Tobacco use: -1/2 pack per day -not quite ready to quit, but thinking about it -wants to know options  HLD: -she has been working on diet, eating less donuts  Callous on foot: -thinks got small piece of glass on it  ROS: See pertinent positives and negatives per HPI.  Past Medical History  Diagnosis Date  . CHF (congestive heart failure)   . Bipolar 1 disorder   . GERD (gastroesophageal reflux disease)   . Hypertension   . Acid reflux   . Bipolar 2 disorder   . Ulcer   . Heart murmur     Family History  Problem Relation Age of Onset  . Prostate cancer Father   . Colon cancer Maternal Grandmother     History   Social History  . Marital Status: Single    Spouse Name: N/A    Number of Children: N/A  . Years of Education: N/A   Social History Main Topics  . Smoking status: Current Every Day Smoker    Types: Cigarettes  . Smokeless tobacco: Never Used     Comment: almost a pack a day;   . Alcohol Use: No  . Drug Use: No  . Sexually Active: None   Other Topics Concern  . None   Social History Narrative   She does have BS degree from [employer]. 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 She is very  Much afraid of losing her job and is very concerned about this. She has supportive friends and ex- sponsors    Current outpatient prescriptions:bisoprolol-hydrochlorothiazide (ZIAC) 5-6.25 MG per tablet, Take 1 tablet by mouth daily., Disp: 90 tablet, Rfl: 3;  busPIRone (BUSPAR) 15 MG tablet, Take 1 tablet (15 mg total) by mouth 3 (three) times daily., Disp:  90 tablet, Rfl: 0;  dexlansoprazole (DEXILANT) 60 MG capsule, Take 1 capsule (60 mg total) by mouth daily., Disp: 90 capsule, Rfl: 3 lisinopril (PRINIVIL,ZESTRIL) 40 MG tablet, Take 1 tablet (40 mg total) by mouth daily., Disp: 90 tablet, Rfl: 0;  LORazepam (ATIVAN) 1 MG tablet, Take 0.5-1 mg by mouth every 8 (eight) hours as needed., Disp: , Rfl: ;  traMADol (ULTRAM) 50 MG tablet, , Disp: , Rfl: ;  amoxicillin (AMOXIL) 875 MG tablet, Take 1 tablet (875 mg total) by mouth 2 (two) times daily., Disp: 20 tablet, Rfl: 0 guaiFENesin-codeine (CHERATUSSIN AC) 100-10 MG/5ML syrup, Take 5 mLs by mouth at bedtime as needed for cough., Disp: 120 mL, Rfl: 0  EXAM:  Filed Vitals:   02/25/13 1009  BP: 120/80  Pulse: 62  Temp: 98.6 F (37 C)    Body mass index is 25.66 kg/(m^2).  GENERAL: vitals reviewed and listed above, alert, oriented, appears well hydrated and in no acute distress  HEENT: atraumatic, conjunttiva clear, no obvious abnormalities on inspection of external nose and ears, normal appearance of ear canals and TMs, white nasal congestion, mild post oropharyngeal erythema with PND, no tonsillar edema or exudate, no sinus TTP  NECK: no obvious masses on inspection  LUNGS: clear to auscultation bilaterally, no wheezes, rales or rhonchi, good air  movement  CV: HRRR, no peripheral edema  SKIN: small callous plantar surface R foot  MS: moves all extremities without noticeable abnormality  PSYCH: pleasant and cooperative, no obvious depression or anxiety  ASSESSMENT AND PLAN:  Discussed the following assessment and plan:  Sinusitis - Plan: amoxicillin (AMOXIL) 875 MG tablet, guaiFENesin-codeine (CHERATUSSIN AC) 100-10 MG/5ML syrup  Tobacco use  Hyperlipemia  Foreign body in foot, unspecified laterality, initial encounter  -tx per above for sinusitis - risks discussed for meds -discussed options to help with smoking cessation 4 minutes -debrided callous, small piece of glass  removed, no bleeding, tol well -will have her follow up for CPE/smoking cessation/recheck labs in May -Patient advised to return or notify a doctor immediately if symptoms worsen or persist or new concerns arise.  There are no Patient Instructions on file for this visit.   Kriste Basque R.

## 2013-03-23 ENCOUNTER — Encounter: Payer: Self-pay | Admitting: Family Medicine

## 2013-03-23 ENCOUNTER — Ambulatory Visit (INDEPENDENT_AMBULATORY_CARE_PROVIDER_SITE_OTHER): Payer: BC Managed Care – PPO | Admitting: Family Medicine

## 2013-03-23 ENCOUNTER — Telehealth: Payer: Self-pay | Admitting: Family Medicine

## 2013-03-23 VITALS — BP 120/86 | Temp 98.5°F | Wt 139.0 lb

## 2013-03-23 DIAGNOSIS — I10 Essential (primary) hypertension: Secondary | ICD-10-CM

## 2013-03-23 DIAGNOSIS — F319 Bipolar disorder, unspecified: Secondary | ICD-10-CM

## 2013-03-23 DIAGNOSIS — R7303 Prediabetes: Secondary | ICD-10-CM

## 2013-03-23 DIAGNOSIS — Z0000 Encounter for general adult medical examination without abnormal findings: Secondary | ICD-10-CM

## 2013-03-23 DIAGNOSIS — J329 Chronic sinusitis, unspecified: Secondary | ICD-10-CM

## 2013-03-23 DIAGNOSIS — E785 Hyperlipidemia, unspecified: Secondary | ICD-10-CM | POA: Insufficient documentation

## 2013-03-23 DIAGNOSIS — Z720 Tobacco use: Secondary | ICD-10-CM

## 2013-03-23 DIAGNOSIS — R7309 Other abnormal glucose: Secondary | ICD-10-CM

## 2013-03-23 DIAGNOSIS — F17200 Nicotine dependence, unspecified, uncomplicated: Secondary | ICD-10-CM

## 2013-03-23 LAB — BASIC METABOLIC PANEL
BUN: 12 mg/dL (ref 6–23)
Calcium: 9.1 mg/dL (ref 8.4–10.5)
Chloride: 102 mEq/L (ref 96–112)
Creatinine, Ser: 0.8 mg/dL (ref 0.4–1.2)
GFR: 80.18 mL/min (ref >60.00)

## 2013-03-23 LAB — LDL CHOLESTEROL, DIRECT: Direct LDL: 183.5 mg/dL

## 2013-03-23 LAB — LIPID PANEL
Cholesterol: 268 mg/dL — ABNORMAL HIGH (ref 0–200)
Total CHOL/HDL Ratio: 4
Triglycerides: 97 mg/dL (ref 0.0–149.0)
VLDL: 19.4 mg/dL (ref 0.0–40.0)

## 2013-03-23 LAB — HEMOGLOBIN A1C: Hgb A1c MFr Bld: 6.5 % (ref 4.6–6.5)

## 2013-03-23 MED ORDER — AZITHROMYCIN 250 MG PO TABS: ORAL_TABLET | ORAL | Status: DC

## 2013-03-23 MED ORDER — BUSPIRONE HCL 15 MG PO TABS: 15.0000 mg | ORAL_TABLET | Freq: Three times a day (TID) | ORAL | Status: DC

## 2013-03-23 NOTE — Telephone Encounter (Signed)
Called and spoke with pt and pt is aware.  Pt states she is not by a calender and cannot make appt at this time but will call back.  Pt states she does not want diabetic education and nutrition at this time.

## 2013-03-23 NOTE — Telephone Encounter (Signed)
Lab results:  Unfortunately, labs show diabetes (mild) and elevated cholesterol.  Cardiovascular exercise (30-40 minutes daily), and not carb/low carb heathy diet can help reverse this and prevent heart and othe problems. Follow up in 1-2 months for recheck and will discuss medication options then. -if would like to see diabetes educator/nutritionist in the meantime - I would highly recommend this.

## 2013-03-23 NOTE — Patient Instructions (Addendum)
-  We have ordered labs or studies at this visit. It can take up to 1-2 weeks for results and processing. We will contact you with instructions IF your results are abnormal. Normal results will be released to your [facility]. If you have not heard from [place] or can not find your results in [facility] in 2 weeks please contact our office.  -PLEASE SIGN UP FOR MYCHART TODAY   We recommend the following healthy lifestyle measures: - eat a healthy diet consisting of lots of vegetables, fruits, beans, nuts, seeds, healthy meats such as white chicken and fish and whole grains.  - avoid fried foods, fast food, processed foods, sodas, red meet and other fattening foods.  - get a least 150 minutes of aerobic exercise per week.   Please take 1200mg  of calcium and 1000 IU of Vit D 3 daily  Quit smoking - try lozenges and relpace habit with a healthy habit  Follow up in: 3 months

## 2013-03-23 NOTE — Progress Notes (Signed)
Chief Complaint  Patient presents with  . Annual Exam    HPI:  Here for CPE:  -Concerns today: none. On ROC has had mammo - diagnostic US and mammo ok. Saw GI, EGD with hiatal hernia - on dexilant and this is taking care of GERD symptoms. Had colonoscopy in 2007. Still having some sinus issues and did not take amoxicillin due to stomach upset - yellow sinus congestion, drainage, some sinus pain - no fevers. She reports tolerates zpak.   -Diet: variety of foods, balance and well rounded, she has cut out sweets - avoids fried foods  -Calcium and vitamin: does not take  -Exercise: walks 2-3 miles per day at job - but not regular CV  -Diabetes and Dyslipidemia Screening: done 09/2012 with elevated cholesterol and prediabetes  -Hx of HTN: yes, treated - well controlled  -Vaccines: UTD  -pap history: s/p complete hysterectomy   -FDLMP: n/a  -sexual activity:no  -wants STI testing: no  -FH breast, colon or ovarian ca: see FH UTD   -Alcohol, Tobacco, drug use: see social history -smokes  Review of Systems - Denies: fatigue, CP, SOB, palpitations, changes in bowels, blood in stools, dizziness, melena, fevers, weight loss, urinary symptoms, vaginal symptoms, skin lesions or rashes  Past Medical History  Diagnosis Date  . CHF (congestive heart failure)   . Bipolar 1 disorder   . GERD (gastroesophageal reflux disease)   . Hypertension   . Acid reflux   . Bipolar 2 disorder   . Ulcer   . Heart murmur     Family History  Problem Relation Age of Onset  . Prostate cancer Father   . Colon cancer Maternal Grandmother     History   Social History  . Marital Status: Single    Spouse Name: N/A    Number of Children: N/A  . Years of Education: N/A   Social History Main Topics  . Smoking status: Current Every Day Smoker    Types: Cigarettes  . Smokeless tobacco: Never Used     Comment: almost a pack a day;   . Alcohol Use: No  . Drug Use: No  . Sexually Active: None    Other Topics Concern  . None   Social History Narrative   She does have BS degree from [employer]. 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 She is very  Much afraid of losing her job and is very concerned about this. She has supportive friends and ex- sponsors    Current outpatient prescriptions:bisoprolol-hydrochlorothiazide (ZIAC) 5-6.25 MG per tablet, Take 1 tablet by mouth daily., Disp: 90 tablet, Rfl: 3;  busPIRone (BUSPAR) 15 MG tablet, Take 1 tablet (15 mg total) by mouth 3 (three) times daily., Disp: 90 tablet, Rfl: 3;  dexlansoprazole (DEXILANT) 60 MG capsule, Take 1 capsule (60 mg total) by mouth daily., Disp: 90 capsule, Rfl: 3 guaiFENesin-codeine (CHERATUSSIN AC) 100-10 MG/5ML syrup, Take 5 mLs by mouth at bedtime as needed for cough., Disp: 120 mL, Rfl: 0;  lisinopril (PRINIVIL,ZESTRIL) 40 MG tablet, Take 1 tablet (40 mg total) by mouth daily., Disp: 90 tablet, Rfl: 0;  methocarbamol (ROBAXIN) 500 MG tablet, Take 500 mg by mouth 3 (three) times daily. , Disp: , Rfl: ;  traMADol (ULTRAM) 50 MG tablet, , Disp: , Rfl:  amoxicillin (AMOXIL) 875 MG tablet, Take 1 tablet (875 mg total) by mouth 2 (two) times daily., Disp: 20 tablet, Rfl: 0;  azithromycin (ZITHROMAX) 250 MG tablet, 2 tabs on first  day, then 1 tab daily for 4 days, Disp: 6 tablet, Rfl: 0;  LORazepam (ATIVAN) 1 MG tablet, Take 0.5-1 mg by mouth every 8 (eight) hours as needed., Disp: , Rfl:   EXAM:  Filed Vitals:   03/23/13 0810  BP: 120/86  Temp: 98.5 F (36.9 C)    GENERAL: vitals reviewed and listed below, alert, oriented, appears well hydrated and in no acute distress  HEENT: head atraumatic, PERRLA, normal appearance of eyes, ears, nose and mouth. moist mucus membranes.  NECK: supple, no masses or lymphadenopathy  LUNGS: clear to auscultation bilaterally, no rales, rhonchi or wheeze  CV: HRRR, no peripheral edema or cyanosis, normal pedal pulses  BREAST: refused  ABDOMEN: bowel  sounds normal, soft, non tender to palpation, no masses, no rebound or guarding  GU: refused  RECTAL: refused  SKIN: no rash or abnormal lesions  MS: normal gait, moves all extremities normally  NEURO: CN II-XII grossly intact, normal muscle strength and sensation to light touch on extremities  PSYCH: normal affect, pleasant and cooperative  ASSESSMENT AND PLAN:  Discussed the following assessment and plan:  Bipolar affective disorder - Plan: busPIRone (BUSPAR) 15 MG tablet  Hypertension - Plan: Basic metabolic panel  Sinusitis  Hyperlipidemia - Plan: Lipid Panel  Prediabetes - Plan: Hemoglobin A1c  Tobacco use  62 yo F here for annual exam:  -Discussed and advised all [place] preventive services health task force level A and B recommendations for age, sex and risks.  -Advised at least 150 minutes of exercise per week and a healthy diet low in saturated fats and sweets and consisting of fresh fruits and vegetables, lean meats such as fish and white chicken and whole grains.  -advised to quit smoking - tobacco cessation, she will try lozneges  -advised calcium and vitamin D  -advised sunscreen and yearly derm skin exam  -refilled buspar  -azithromycin for ongoing sinus issues/sinusitis  -FASTING LABS: HgbA1c, lipid panel, BMP  -labs, studies and vaccines per orders this encounter  Orders Placed This Encounter  Procedures  . Lipid Panel  . Hemoglobin A1c  . Basic metabolic panel    Patient Instructions  -We have ordered labs or studies at this visit. It can take up to 1-2 weeks for results and processing. We will contact you with instructions IF your results are abnormal. Normal results will be released to your [facility]. If you have not heard from [place] or can not find your results in [facility] in 2 weeks please contact our office.  -PLEASE SIGN UP FOR MYCHART TODAY   We recommend the following healthy lifestyle measures: - eat a healthy diet consisting of lots of  vegetables, fruits, beans, nuts, seeds, healthy meats such as white chicken and fish and whole grains.  - avoid fried foods, fast food, processed foods, sodas, red meet and other fattening foods.  - get a least 150 minutes of aerobic exercise per week.   Please take 1200mg  of calcium and 1000 IU of Vit D 3 daily  Quit smoking - try lozenges and relpace habit with a healthy habit  Follow up in: 3 months     Patient advised to return to clinic immediately if symptoms worsen or persist or new concerns.  @[email or web address] @  Return in about 3 months (around 06/23/2013) for follow up.  Kriste Basque R.

## 2013-03-31 ENCOUNTER — Other Ambulatory Visit: Payer: Self-pay

## 2013-03-31 MED ORDER — LISINOPRIL 40 MG PO TABS: 40.0000 mg | ORAL_TABLET | Freq: Every day | ORAL | Status: DC

## 2013-03-31 NOTE — Telephone Encounter (Signed)
Rx sent to pharmacy for Lisinopril 40 mg.

## 2013-09-08 ENCOUNTER — Telehealth: Payer: Self-pay | Admitting: Family Medicine

## 2013-09-08 NOTE — Telephone Encounter (Signed)
Pt requesting refill of bisoprolol-hydrochlorothiazide (ZIAC) 5-6.25 MG per tablet sent to [phone].  Pt states she was supposed to have lab work done in August but she did not.  Please advise if pt needs to have these lab done now, if so please enter appropriate order.

## 2013-09-08 NOTE — Telephone Encounter (Signed)
Rx called in to pharmacy.  Pls have pt have labs done and come back to see Dr. Selena Batten when she returns to discuss medication options.

## 2013-09-09 ENCOUNTER — Other Ambulatory Visit: Payer: Self-pay

## 2013-09-09 MED ORDER — BISOPROLOL-HYDROCHLOROTHIAZIDE 5-6.25 MG PO TABS: 1.0000 | ORAL_TABLET | Freq: Every day | ORAL | Status: DC

## 2013-09-10 ENCOUNTER — Other Ambulatory Visit: Payer: Self-pay | Admitting: Family Medicine

## 2013-09-10 DIAGNOSIS — R7303 Prediabetes: Secondary | ICD-10-CM

## 2013-09-10 DIAGNOSIS — E785 Hyperlipidemia, unspecified: Secondary | ICD-10-CM

## 2013-09-11 ENCOUNTER — Other Ambulatory Visit (INDEPENDENT_AMBULATORY_CARE_PROVIDER_SITE_OTHER): Payer: BC Managed Care – PPO

## 2013-09-11 DIAGNOSIS — R7303 Prediabetes: Secondary | ICD-10-CM

## 2013-09-11 DIAGNOSIS — R7309 Other abnormal glucose: Secondary | ICD-10-CM

## 2013-09-11 DIAGNOSIS — E785 Hyperlipidemia, unspecified: Secondary | ICD-10-CM

## 2013-09-11 LAB — LIPID PANEL
Cholesterol: 276 mg/dL — ABNORMAL HIGH (ref 0–200)
VLDL: 25.8 mg/dL (ref 0.0–40.0)

## 2013-09-11 LAB — LDL CHOLESTEROL, DIRECT: Direct LDL: 207 mg/dL

## 2013-09-14 ENCOUNTER — Telehealth: Payer: Self-pay | Admitting: Family Medicine

## 2013-09-14 DIAGNOSIS — R7303 Prediabetes: Secondary | ICD-10-CM

## 2013-09-14 NOTE — Telephone Encounter (Addendum)
Pt would like results of labs done 10/31. Would like results today if possible. thanks

## 2013-09-15 NOTE — Telephone Encounter (Signed)
Called and spoke with pt.  Advised pt that Dr. Selena Batten had not signed off on labs due to being out of the office.  Pt verbalized understanding and wanted the numbers of her cholesterol.  Pt would like to see a diabetes educator.  Advised pt that she needed a follow up appt to see Dr. Selena Batten to discuss lab work further and treatment plans.  Pt verbalized understanding. Referral ordered.

## 2013-09-28 NOTE — Progress Notes (Signed)
Quick Note:  Left a message for return call. ______ 

## 2013-10-06 ENCOUNTER — Encounter: Payer: Self-pay | Admitting: Family Medicine

## 2013-10-06 ENCOUNTER — Ambulatory Visit (INDEPENDENT_AMBULATORY_CARE_PROVIDER_SITE_OTHER): Payer: BC Managed Care – PPO | Admitting: Family Medicine

## 2013-10-06 VITALS — BP 120/78 | Temp 98.2°F | Wt 136.0 lb

## 2013-10-06 DIAGNOSIS — R7303 Prediabetes: Secondary | ICD-10-CM

## 2013-10-06 DIAGNOSIS — Z720 Tobacco use: Secondary | ICD-10-CM

## 2013-10-06 DIAGNOSIS — F319 Bipolar disorder, unspecified: Secondary | ICD-10-CM

## 2013-10-06 DIAGNOSIS — E785 Hyperlipidemia, unspecified: Secondary | ICD-10-CM

## 2013-10-06 DIAGNOSIS — R7309 Other abnormal glucose: Secondary | ICD-10-CM

## 2013-10-06 DIAGNOSIS — I10 Essential (primary) hypertension: Secondary | ICD-10-CM

## 2013-10-06 DIAGNOSIS — F17200 Nicotine dependence, unspecified, uncomplicated: Secondary | ICD-10-CM

## 2013-10-06 MED ORDER — PRAVASTATIN SODIUM 40 MG PO TABS: 40.0000 mg | ORAL_TABLET | Freq: Every day | ORAL | Status: DC

## 2013-10-06 NOTE — Patient Instructions (Signed)
-  We placed a referral for you as discussed to the diabetes educator. It usually takes about 1-2 weeks to process and schedule this referral. If you have not heard from [place] regarding this appointment in 2 weeks please contact our office.  -get an eye exam yearly  -We recommend the following healthy lifestyle measures: - eat a healthy diet consisting of lots of vegetables, fruits, beans, nuts, seeds, healthy meats such as white chicken and fish and whole grains.  - avoid fried foods, fast food, processed foods, sodas, red meet and other fattening foods.  - get a least 150 minutes of aerobic exercise per week.   -follow up in 3-4 months

## 2013-10-06 NOTE — Progress Notes (Signed)
Pre visit review using our clinic review tool, if applicable. No additional management support is needed unless otherwise documented below in the visit note. 

## 2013-10-06 NOTE — Progress Notes (Signed)
Chief Complaint  Patient presents with  . Follow-up    HPI:  Follow up:  Recent labs showed mild diabetes and hyperlipidemia: -here to discuss options -has stopped pizza and pasta and carbs, but does still goes to [employer] daily -walks a lot at work; no CV exercise -denies: foot lesions, polyuria, polydipsia  HTN/?CHF: -pt denies chf and reports normal echo in the past when saw cards for benign murmur -takes bisoprolol-hctz and lisinopril -denies: CP, SOB, palpitations, swelling, HA  GERD: -stable  Bipolar/Anx: -on buspar chronically -stable   ROS: See pertinent positives and negatives per HPI.  Past Medical History  Diagnosis Date  . CHF (congestive heart failure)   . Bipolar 1 disorder   . GERD (gastroesophageal reflux disease)   . Hypertension   . Acid reflux   . Bipolar 2 disorder   . Ulcer   . Heart murmur     Past Surgical History  Procedure Laterality Date  . Tonsillectomy and adenoidectomy    . Shoulder repalcement    . Abdominal hysterectomy      complete for fibroids    Family History  Problem Relation Age of Onset  . Prostate cancer Father   . Colon cancer Maternal Grandmother     History   Social History  . Marital Status: Single    Spouse Name: N/A    Number of Children: N/A  . Years of Education: N/A   Social History Main Topics  . Smoking status: Current Every Day Smoker    Types: Cigarettes  . Smokeless tobacco: Never Used     Comment: almost a pack a day;   . Alcohol Use: No  . Drug Use: No  . Sexual Activity: None   Other Topics Concern  . None   Social History Narrative   She does have BS degree from [employer]. 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 She is very  Much afraid of losing her job and is very concerned about this. She has supportive friends and ex- sponsors    Current outpatient prescriptions:bisoprolol-hydrochlorothiazide (ZIAC) 5-6.25 MG per tablet, Take 1 tablet by mouth  daily., Disp: 90 tablet, Rfl: 1;  busPIRone (BUSPAR) 15 MG tablet, Take 1 tablet (15 mg total) by mouth 3 (three) times daily., Disp: 90 tablet, Rfl: 3;  lisinopril (PRINIVIL,ZESTRIL) 40 MG tablet, Take 1 tablet (40 mg total) by mouth daily., Disp: 90 tablet, Rfl: 2 omeprazole (PRILOSEC OTC) 20 MG tablet, Take 20 mg by mouth daily., Disp: , Rfl: ;  traMADol (ULTRAM) 50 MG tablet, , Disp: , Rfl: ;  pravastatin (PRAVACHOL) 40 MG tablet, Take 1 tablet (40 mg total) by mouth daily., Disp: 90 tablet, Rfl: 3  EXAM:  Filed Vitals:   10/06/13 1322  BP: 120/78  Temp: 98.2 F (36.8 C)    Body mass index is 25.28 kg/(m^2).  GENERAL: vitals reviewed and listed above, alert, oriented, appears well hydrated and in no acute distress  HEENT: atraumatic, conjunttiva clear, no obvious abnormalities on inspection of external nose and ears  NECK: no obvious masses on inspection  LUNGS: clear to auscultation bilaterally, no wheezes, rales or rhonchi, good air movement  CV: HRRR, no peripheral edema  MS: moves all extremities without noticeable abnormality  PSYCH: pleasant and cooperative, no obvious depression or anxiety  ASSESSMENT AND PLAN:  Discussed the following assessment and plan:  Hypertension  Bipolar affective disorder  Tobacco use  Prediabetes  Hyperlipidemia - Plan: pravastatin (PRAVACHOL) 40 MG tablet  -  discussed dx of mild diabetes and hyperlipidemia, course of these diseases and treatment options, stressed importance of lifestyle changes -advised: referral to diabetes educator, lifestyle changes, statin after discussion risks, yearly eye exam, repeat labs at next app -refused flu vaccine -continue BP medications and other medications -follow up 3-4 months -Patient advised to return or notify a doctor immediately if symptoms worsen or persist or new concerns arise.  Patient Instructions  -We placed a referral for you as discussed to the diabetes educator. It usually takes  about 1-2 weeks to process and schedule this referral. If you have not heard from [place] regarding this appointment in 2 weeks please contact our office.  -get an eye exam yearly  -We recommend the following healthy lifestyle measures: - eat a healthy diet consisting of lots of vegetables, fruits, beans, nuts, seeds, healthy meats such as white chicken and fish and whole grains.  - avoid fried foods, fast food, processed foods, sodas, red meet and other fattening foods.  - get a least 150 minutes of aerobic exercise per week.   -follow up in 3-4 months      Dawnita Molner R.

## 2013-10-19 ENCOUNTER — Telehealth: Payer: Self-pay | Admitting: Family Medicine

## 2013-10-19 MED ORDER — LORAZEPAM 1 MG PO TABS: 0.5000 mg | ORAL_TABLET | Freq: Three times a day (TID) | ORAL | Status: DC | PRN

## 2013-10-19 NOTE — Telephone Encounter (Signed)
Pt needs new rx lorazepam for anxiety. [facility] [address]

## 2013-10-20 ENCOUNTER — Telehealth: Payer: Self-pay | Admitting: Family Medicine

## 2013-10-20 NOTE — Telephone Encounter (Signed)
Rx called in to [facility].

## 2013-10-20 NOTE — Telephone Encounter (Signed)
Pt states the pharm called and her rx LORazepam (ATIVAN) 1 MG tablet needs to be phoned in, cannot  Fax. pls call pt when done,. [facility]

## 2013-10-20 NOTE — Telephone Encounter (Signed)
Faxed to pharmacy

## 2013-11-18 ENCOUNTER — Encounter: Payer: Self-pay | Admitting: *Deleted

## 2013-11-19 ENCOUNTER — Ambulatory Visit (INDEPENDENT_AMBULATORY_CARE_PROVIDER_SITE_OTHER): Payer: BC Managed Care – PPO | Admitting: Family Medicine

## 2013-11-19 VITALS — BP 120/78 | Temp 97.9°F | Wt 134.0 lb

## 2013-11-19 DIAGNOSIS — F411 Generalized anxiety disorder: Secondary | ICD-10-CM

## 2013-11-19 DIAGNOSIS — J329 Chronic sinusitis, unspecified: Secondary | ICD-10-CM

## 2013-11-19 DIAGNOSIS — F319 Bipolar disorder, unspecified: Secondary | ICD-10-CM

## 2013-11-19 MED ORDER — HYDROCOD POLST-CHLORPHEN POLST 10-8 MG/5ML PO LQCR: 5.0000 mL | Freq: Two times a day (BID) | ORAL | Status: DC | PRN

## 2013-11-19 MED ORDER — LORAZEPAM 1 MG PO TABS: 0.5000 mg | ORAL_TABLET | Freq: Three times a day (TID) | ORAL | Status: DC | PRN

## 2013-11-19 MED ORDER — AZITHROMYCIN 250 MG PO TABS: ORAL_TABLET | ORAL | Status: DC

## 2013-11-19 NOTE — Progress Notes (Signed)
Pre visit review using our clinic review tool, if applicable. No additional management support is needed unless otherwise documented below in the visit note. 

## 2013-11-19 NOTE — Progress Notes (Signed)
Chief Complaint  Patient presents with  . Sinusitis    cough, drainage     HPI:  -started:2 weeks - getting worse -symptoms:nasal congestion, sore throat, cough, sinus pain maxillary has developed, drainage -denies:fever, SOB, NVD, tooth pain -has tried: nothing -sick contacts/travel/risks: denies flu exposure or Ebola risks -Hx of: hx of sinusitis  Anxiety with occ panic attacks: -worse over last few weeks as starting a new job -wants small sort course of benzo for panic attacks -no counseling recently - did this in the past -on buspar, no SI  ROS: See pertinent positives and negatives per HPI.  Past Medical History  Diagnosis Date  . CHF (congestive heart failure)   . Bipolar 1 disorder   . GERD (gastroesophageal reflux disease)   . Hypertension   . Acid reflux   . Bipolar 2 disorder   . Ulcer   . Heart murmur     Past Surgical History  Procedure Laterality Date  . Tonsillectomy and adenoidectomy    . Shoulder repalcement    . Abdominal hysterectomy      complete for fibroids    Family History  Problem Relation Age of Onset  . Prostate cancer Father   . Colon cancer Maternal Grandmother     History   Social History  . Marital Status: Single    Spouse Name: N/A    Number of Children: N/A  . Years of Education: N/A   Social History Main Topics  . Smoking status: Current Every Day Smoker    Types: Cigarettes  . Smokeless tobacco: Never Used     Comment: almost a pack a day;   . Alcohol Use: No  . Drug Use: No  . Sexual Activity: Not on file   Other Topics Concern  . Not on file   Social History Narrative   She does have BS degree from [employer]. 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 She is very  Much afraid of losing her job and is very concerned about this. She has supportive friends and ex- sponsors    Current outpatient prescriptions:bisoprolol-hydrochlorothiazide (ZIAC) 5-6.25 MG per tablet, Take 1 tablet by  mouth daily., Disp: 90 tablet, Rfl: 1;  busPIRone (BUSPAR) 15 MG tablet, Take 1 tablet (15 mg total) by mouth 3 (three) times daily., Disp: 90 tablet, Rfl: 3;  lisinopril (PRINIVIL,ZESTRIL) 40 MG tablet, Take 1 tablet (40 mg total) by mouth daily., Disp: 90 tablet, Rfl: 2 LORazepam (ATIVAN) 1 MG tablet, Take 0.5-1 tablets (0.5-1 mg total) by mouth every 8 (eight) hours as needed for anxiety (use very sparingly)., Disp: 15 tablet, Rfl: 0;  omeprazole (PRILOSEC OTC) 20 MG tablet, Take 20 mg by mouth daily., Disp: , Rfl: ;  pravastatin (PRAVACHOL) 40 MG tablet, Take 1 tablet (40 mg total) by mouth daily., Disp: 90 tablet, Rfl: 3;  traMADol (ULTRAM) 50 MG tablet, , Disp: , Rfl:  azithromycin (ZITHROMAX) 250 MG tablet, 2 tabs on first day then 1 tab daily, Disp: 6 tablet, Rfl: 0;  chlorpheniramine-HYDROcodone (TUSSIONEX PENNKINETIC ER) 10-8 MG/5ML LQCR, Take 5 mLs by mouth every 12 (twelve) hours as needed for cough., Disp: 115 mL, Rfl: 0  EXAM:  Filed Vitals:   11/19/13 0903  BP: 120/78  Temp: 97.9 F (36.6 C)    Body mass index is 24.91 kg/(m^2).  GENERAL: vitals reviewed and listed above, alert, oriented, appears well hydrated and in no acute distress  HEENT: atraumatic, conjunttiva clear, no obvious abnormalities on inspection  of external nose and ears, normal appearance of ear canals and TMs, clear nasal congestion, mild post oropharyngeal erythema with PND, no tonsillar edema or exudate, no sinus TTP  NECK: no obvious masses on inspection  LUNGS: clear to auscultation bilaterally, no wheezes, rales or rhonchi, good air movement  CV: HRRR, no peripheral edema  MS: moves all extremities without noticeable abnormality  PSYCH: pleasant and cooperative, no obvious depression or anxiety  ASSESSMENT AND PLAN:  Discussed the following assessment and plan:  Sinusitis - Plan: azithromycin (ZITHROMAX) 250 MG tablet, chlorpheniramine-HYDROcodone (TUSSIONEX PENNKINETIC ER) 10-8 MG/5ML  LQCR  Generalized anxiety disorder -increase buspar to bid -short course ativan refilled as she feels will only need occ for next two weeks while transitioning job -counseling - number given to call -follow up in 1 month  Labs at follow up  Bipolar affective disorder - Plan: LORazepam (ATIVAN) 1 MG tablet  -We discussed treatment side effects, likely course, antibiotic misuse, transmission, and signs of developing a serious illness. -of course, we advised to return or notify a doctor immediately if symptoms worsen or persist or new concerns arise.    There are no Patient Instructions on file for this visit.   Colin Benton R.

## 2013-12-02 ENCOUNTER — Ambulatory Visit (INDEPENDENT_AMBULATORY_CARE_PROVIDER_SITE_OTHER): Payer: BC Managed Care – PPO | Admitting: Licensed Clinical Social Worker

## 2013-12-02 DIAGNOSIS — F4322 Adjustment disorder with anxiety: Secondary | ICD-10-CM

## 2013-12-09 ENCOUNTER — Ambulatory Visit: Payer: BC Managed Care – PPO | Admitting: Licensed Clinical Social Worker

## 2013-12-09 ENCOUNTER — Telehealth: Payer: Self-pay | Admitting: Family Medicine

## 2013-12-09 ENCOUNTER — Ambulatory Visit (INDEPENDENT_AMBULATORY_CARE_PROVIDER_SITE_OTHER): Payer: BC Managed Care – PPO | Admitting: Family Medicine

## 2013-12-09 ENCOUNTER — Encounter: Payer: Self-pay | Admitting: Family Medicine

## 2013-12-09 VITALS — BP 130/80 | Temp 98.0°F | Wt 134.0 lb

## 2013-12-09 DIAGNOSIS — F319 Bipolar disorder, unspecified: Secondary | ICD-10-CM

## 2013-12-09 DIAGNOSIS — F411 Generalized anxiety disorder: Secondary | ICD-10-CM

## 2013-12-09 DIAGNOSIS — F410 Panic disorder [episodic paroxysmal anxiety] without agoraphobia: Secondary | ICD-10-CM

## 2013-12-09 MED ORDER — LORAZEPAM 1 MG PO TABS: 0.5000 mg | ORAL_TABLET | Freq: Three times a day (TID) | ORAL | Status: DC | PRN

## 2013-12-09 NOTE — Patient Instructions (Signed)
-  call for an appointment to see the psychiatrist  -continue current medications  -continue counseling

## 2013-12-09 NOTE — Progress Notes (Signed)
Chief Complaint  Patient presents with  . Anxiety    HPI:  Anxiety/panic disorder: -hx ? 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  Past Medical History  Diagnosis Date  . CHF (congestive heart failure)   . Bipolar 1 disorder   . GERD (gastroesophageal reflux disease)   . Hypertension   . Acid reflux   . Bipolar 2 disorder   . Ulcer   . Heart murmur     Past Surgical History  Procedure Laterality Date  . Tonsillectomy and adenoidectomy    . Shoulder repalcement    . Abdominal hysterectomy      complete for fibroids    Family History  Problem Relation Age of Onset  . Prostate cancer Father   . Colon cancer Maternal Grandmother     History   Social History  . Marital Status: Single    Spouse Name: N/A    Number of Children: N/A  . Years of Education: N/A   Social History Main Topics  . Smoking status: Current Every Day Smoker    Types: Cigarettes  . Smokeless tobacco: Never Used     Comment: almost a pack a  day;   . Alcohol Use: No  . Drug Use: No  . Sexual Activity: None   Other Topics Concern  . None   Social History Narrative   She does have BS degree from [employer]. 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 She is very  Much afraid of losing her job and is very concerned about this. She has supportive friends and ex- sponsors    Current outpatient prescriptions:bisoprolol-hydrochlorothiazide (ZIAC) 5-6.25 MG per tablet, Take 1 tablet by mouth daily., Disp: 90 tablet, Rfl: 1;  busPIRone (BUSPAR) 15 MG tablet, Take 1 tablet (15 mg total) by mouth 3 (three) times daily., Disp: 90 tablet, Rfl: 3;  lisinopril (PRINIVIL,ZESTRIL) 40 MG tablet, Take 1 tablet (40 mg total) by mouth daily., Disp: 90 tablet, Rfl: 2 omeprazole (PRILOSEC OTC) 20 MG tablet, Take 20 mg by mouth daily., Disp: , Rfl: ;  pravastatin (PRAVACHOL) 40 MG tablet, Take 1 tablet (40 mg total) by mouth daily., Disp: 90 tablet, Rfl: 3;  traMADol (ULTRAM) 50 MG tablet, , Disp: , Rfl: ;  LORazepam (ATIVAN) 1 MG tablet, Take 0.5-1 tablets (0.5-1 mg total) by mouth every 8 (eight) hours as needed for anxiety (use very sparingly).,  Disp: 30 tablet, Rfl: 0  EXAM:  Filed Vitals:   12/09/13 0951  BP: 130/80  Temp: 98 F (36.7 C)    Body mass index is 24.91 kg/(m^2).  GENERAL: vitals reviewed and listed above, alert, oriented, appears well hydrated and in no acute distress  HEENT: atraumatic, conjunttiva clear, no obvious abnormalities on inspection of external nose and ears  NECK: no obvious masses on inspection  MS: moves all extremities without noticeable abnormality  PSYCH: pleasant and cooperative, tearful and anxious  ASSESSMENT AND PLAN:  Discussed the following assessment and plan:  Generalized anxiety disorder - Plan: LORazepam (ATIVAN) 1 MG tablet  Panic disorder - Plan: LORazepam (ATIVAN) 1 MG tablet  Bipolar affective disorder  -complex psych issues and discussed options - advised her  that she should not be embarrassed by her symptoms and that she should not treat mental health any different then physical health in terms of stigma/etc. -she is going to continue to work with counselor in terms of coping with the new job situation -she is afraid of the benzos and we did discuss their risks and benefits and she will use sparingly for panic disorder though I did advise she may benefit from taking a normal dose -she has been on buspar chronically so don't think now is a good time to stop this though a mood stabilizer may be in order -I do feels she should see a psychiatrist given hx bipolar disorder, severe anxiety and panic disorder - she is in agreement and will schedule this appt - refilled medication until that appointment but did explained that I am a family doctor and I do not do a lot of psychiatric management of this nature and would advise for her wellbeing that she have psych meds managed by a psychiatrist going forward -Patient advised to return or notify a doctor immediately if symptoms worsen or persist or new concerns arise.  There are no Patient Instructions on file for this visit.   Colin Benton R.

## 2013-12-09 NOTE — Progress Notes (Signed)
Pre visit review using our clinic review tool, if applicable. No additional management support is needed unless otherwise documented below in the visit note. 

## 2013-12-09 NOTE — Telephone Encounter (Signed)
Richardo Priest report pt late cancelled counseling appt today. At our appt I had advised her to continue the counseling and see a psychiatrist for medication recommendation/management. Called pt to check on reason for not doing counseling - LM that I feel it would be helpful for her to continue her counseling with Richardo Priest and that she can call me with any question. Advised I will be back in the office on Monday.

## 2013-12-14 ENCOUNTER — Telehealth: Payer: Self-pay | Admitting: Family Medicine

## 2013-12-14 ENCOUNTER — Telehealth: Payer: Self-pay

## 2013-12-14 NOTE — Telephone Encounter (Signed)
Received a vm from pt stating that at appt pt thought Dr. Maudie Mercury wanted her to stop see Lezlie Octave and to see a psychiatrist to manage her medications.  Pt states that is why she cancelled the appointment.

## 2013-12-14 NOTE — Telephone Encounter (Signed)
Patient returned call and states she is planning to go see [facility] but she had to call on her day off and could not get an appt for that day.  Pt states she is off tomorrow and will call and get an appointment.  Pt states she is going to see if she can get an appt with Lezlie Octave this week as well. Dr. Maudie Mercury is aware of this.

## 2013-12-14 NOTE — Telephone Encounter (Signed)
Attempted to reach pt to answer her question. LM for her to return my call if she has questions. Colin Benton R.

## 2013-12-15 ENCOUNTER — Telehealth: Payer: Self-pay

## 2013-12-15 NOTE — Telephone Encounter (Signed)
Received a call from pt updating on the status of psychiatrist appointment.  Pt states she has an upcoming appt with Lezlie Octave next Wednesday at 1 pm but when she called [facility] she was told they could only make the appointment once the pt made a payment of $150-175 dollars.  Pt states she cannot afford that at this time and she MAY be able to next week but she will call to let us know.

## 2013-12-16 NOTE — Telephone Encounter (Signed)
[phone] (home) [phone] (work)  Attempted to call pt. No answer. I have advised she needs to see psychiatrist for pharmacologic management of her mental health concerns. If she needs recommendations for low cost mental health clinic please provide those (list in my office if you need it). However, I believe [facility] works with patients going forward after the up front payment and do feel it would be worth it in her case.

## 2013-12-18 NOTE — Telephone Encounter (Signed)
Patient returned call and stated [facility] but she cannot do that until Thursday.  Pt just wanted to keep you updated on her process. Called pt back and left a vm with the low cost clinics as well.

## 2013-12-18 NOTE — Telephone Encounter (Signed)
Left a message for pt to continue with appt with psychiatrist and to call back to get information about low cost mental health clinics.

## 2013-12-22 ENCOUNTER — Ambulatory Visit (INDEPENDENT_AMBULATORY_CARE_PROVIDER_SITE_OTHER): Payer: BC Managed Care – PPO | Admitting: Licensed Clinical Social Worker

## 2013-12-22 ENCOUNTER — Telehealth: Payer: Self-pay

## 2013-12-22 DIAGNOSIS — F4322 Adjustment disorder with anxiety: Secondary | ICD-10-CM

## 2013-12-22 MED ORDER — LISINOPRIL 40 MG PO TABS: 40.0000 mg | ORAL_TABLET | Freq: Every day | ORAL | Status: DC

## 2013-12-22 NOTE — Telephone Encounter (Signed)
Ericka needs a refill for her Synapril 40 mg called into the [facility] on [address]

## 2013-12-22 NOTE — Telephone Encounter (Signed)
Lisinopril sent in electronically to [facility]

## 2014-01-01 ENCOUNTER — Ambulatory Visit (INDEPENDENT_AMBULATORY_CARE_PROVIDER_SITE_OTHER): Payer: BC Managed Care – PPO | Admitting: Family Medicine

## 2014-01-01 VITALS — BP 177/84 | Temp 98.0°F | Wt 133.0 lb

## 2014-01-01 DIAGNOSIS — F411 Generalized anxiety disorder: Secondary | ICD-10-CM

## 2014-01-01 DIAGNOSIS — J069 Acute upper respiratory infection, unspecified: Secondary | ICD-10-CM

## 2014-01-01 DIAGNOSIS — F410 Panic disorder [episodic paroxysmal anxiety] without agoraphobia: Secondary | ICD-10-CM

## 2014-01-01 MED ORDER — LORAZEPAM 1 MG PO TABS: 0.5000 mg | ORAL_TABLET | Freq: Three times a day (TID) | ORAL | Status: DC | PRN

## 2014-01-01 MED ORDER — BENZONATATE 100 MG PO CAPS: 100.0000 mg | ORAL_CAPSULE | Freq: Two times a day (BID) | ORAL | Status: DC | PRN

## 2014-01-01 NOTE — Progress Notes (Signed)
Pre visit review using our clinic review tool, if applicable. No additional management support is needed unless otherwise documented below in the visit note. 

## 2014-01-01 NOTE — Progress Notes (Signed)
Chief Complaint  Patient presents with  . Sinusitis    HPI:  URI: -started: 3 days ago -symptoms:nasal congestion, sore throat, cough, drainage -denies:fever, SOB, NVD, tooth pain -has tried: saline, afrin for 2 days -sick contacts/travel/risks: denies flu exposure, tick exposure or or Ebola risks  Anxiety/bipolar: -scheduled to see psych, getting counseling -doing a little better but still has panic attacks and wants refill on benzo until sees psych  ROS: See pertinent positives and negatives per HPI.  Past Medical History  Diagnosis Date  . CHF (congestive heart failure)   . Bipolar 1 disorder   . GERD (gastroesophageal reflux disease)   . Hypertension   . Acid reflux   . Bipolar 2 disorder   . Ulcer   . Heart murmur     Past Surgical History  Procedure Laterality Date  . Tonsillectomy and adenoidectomy    . Shoulder repalcement    . Abdominal hysterectomy      complete for fibroids    Family History  Problem Relation Age of Onset  . Prostate cancer Father   . Colon cancer Maternal Grandmother     History   Social History  . Marital Status: Single    Spouse Name: N/A    Number of Children: N/A  . Years of Education: N/A   Social History Main Topics  . Smoking status: Current Every Day Smoker    Types: Cigarettes  . Smokeless tobacco: Never Used     Comment: almost a pack a day;   . Alcohol Use: No  . Drug Use: No  . Sexual Activity: Not on file   Other Topics Concern  . Not on file   Social History Narrative   She does have BS degree from [employer]. 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 She is very  Much afraid of losing her job and is very concerned about this. She has supportive friends and ex- sponsors    Current outpatient prescriptions:bisoprolol-hydrochlorothiazide (ZIAC) 5-6.25 MG per tablet, Take 1 tablet by mouth daily., Disp: 90 tablet, Rfl: 1;  busPIRone (BUSPAR) 15 MG tablet, Take 1 tablet (15 mg total)  by mouth 3 (three) times daily., Disp: 90 tablet, Rfl: 3;  lisinopril (PRINIVIL,ZESTRIL) 40 MG tablet, Take 1 tablet (40 mg total) by mouth daily., Disp: 90 tablet, Rfl: 2 LORazepam (ATIVAN) 1 MG tablet, Take 0.5-1 tablets (0.5-1 mg total) by mouth every 8 (eight) hours as needed for anxiety (use very sparingly)., Disp: 15 tablet, Rfl: 0;  omeprazole (PRILOSEC OTC) 20 MG tablet, Take 20 mg by mouth daily., Disp: , Rfl: ;  pravastatin (PRAVACHOL) 40 MG tablet, Take 1 tablet (40 mg total) by mouth daily., Disp: 90 tablet, Rfl: 3;  traMADol (ULTRAM) 50 MG tablet, , Disp: , Rfl:  benzonatate (TESSALON) 100 MG capsule, Take 1 capsule (100 mg total) by mouth 2 (two) times daily as needed for cough., Disp: 20 capsule, Rfl: 0  EXAM:  Filed Vitals:   01/01/14 0853  BP: 177/84  Temp: 98 F (36.7 C)    Body mass index is 24.73 kg/(m^2).  GENERAL: vitals reviewed and listed above, alert, oriented, appears well hydrated and in no acute distress  HEENT: atraumatic, conjunttiva clear, no obvious abnormalities on inspection of external nose and ears, normal appearance of ear canals and TMs, clear nasal congestion, mild post oropharyngeal erythema with PND, no tonsillar edema or exudate, no sinus TTP  NECK: no obvious masses on inspection  LUNGS: clear to auscultation  bilaterally, no wheezes, rales or rhonchi, good air movement  CV: HRRR, no peripheral edema  MS: moves all extremities without noticeable abnormality  PSYCH: pleasant and cooperative, no obvious depression or anxiety  ASSESSMENT AND PLAN:  Discussed the following assessment and plan:  Generalized anxiety disorder - Plan: LORazepam (ATIVAN) 1 MG tablet  Panic disorder - Plan: LORazepam (ATIVAN) 1 MG tablet  Upper respiratory infection - Plan: benzonatate (TESSALON) 100 MG capsule  -given HPI and exam findings today, a serious infection or illness is unlikely. We discussed potential etiologies, with VURI being most likely, and  advised supportive care and monitoring. We discussed treatment side effects, likely course, antibiotic misuse, transmission, and signs of developing a serious illness. -for anxiety refilled medication until she sees psych but advised all further refills need to come from her psychiatrist -of course, we advised to return or notify a doctor immediately if symptoms worsen or persist or new concerns arise.    Patient Instructions  INSTRUCTIONS FOR UPPER RESPIRATORY INFECTION:  -plenty of rest and fluids  -nasal saline wash 2-3 times daily (use prepackaged nasal saline or bottled/distilled water if making your own)   -clean nose with nasal saline before using the nasal steroid or sinex  -can use sinex or afrin nasal spray for drainage and nasal congestion - but do NOT use longer then 3-4 days  -can use tylenol or ibuprofen as directed for aches and sorethroat  -in the winter time, using a humidifier at night is helpful (please follow cleaning instructions)  -if you are taking a cough medication - use only as directed, may also try a teaspoon of honey to coat the throat and throat lozenges  -for sore throat, salt water gargles can help  -follow up if you have fevers, facial pain, tooth pain, difficulty breathing or are worsening or not getting better in 5-7 days      KIM, HANNAH R.

## 2014-01-01 NOTE — Patient Instructions (Signed)
INSTRUCTIONS FOR UPPER RESPIRATORY INFECTION:  -plenty of rest and fluids  -nasal saline wash 2-3 times daily (use prepackaged nasal saline or bottled/distilled water if making your own)   -clean nose with nasal saline before using the nasal steroid or sinex  -can use sinex or afrin nasal spray for drainage and nasal congestion - but do NOT use longer then 3-4 days  -can use tylenol or ibuprofen as directed for aches and sorethroat  -in the winter time, using a humidifier at night is helpful (please follow cleaning instructions)  -if you are taking a cough medication - use only as directed, may also try a teaspoon of honey to coat the throat and throat lozenges  -for sore throat, salt water gargles can help  -follow up if you have fevers, facial pain, tooth pain, difficulty breathing or are worsening or not getting better in 5-7 days

## 2014-01-12 ENCOUNTER — Ambulatory Visit (INDEPENDENT_AMBULATORY_CARE_PROVIDER_SITE_OTHER): Payer: BC Managed Care – PPO | Admitting: Psychiatry

## 2014-01-12 ENCOUNTER — Encounter (HOSPITAL_COMMUNITY): Payer: Self-pay | Admitting: Psychiatry

## 2014-01-12 ENCOUNTER — Encounter (INDEPENDENT_AMBULATORY_CARE_PROVIDER_SITE_OTHER): Payer: Self-pay

## 2014-01-12 VITALS — BP 142/90 | HR 76 | Ht 62.0 in | Wt 133.6 lb

## 2014-01-12 DIAGNOSIS — F410 Panic disorder [episodic paroxysmal anxiety] without agoraphobia: Secondary | ICD-10-CM

## 2014-01-12 DIAGNOSIS — F411 Generalized anxiety disorder: Secondary | ICD-10-CM

## 2014-01-12 MED ORDER — LORAZEPAM 1 MG PO TABS: 0.5000 mg | ORAL_TABLET | Freq: Three times a day (TID) | ORAL | Status: DC | PRN
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
## 2014-01-19 ENCOUNTER — Telehealth (HOSPITAL_COMMUNITY): Payer: Self-pay | Admitting: *Deleted

## 2014-01-19 NOTE — Telephone Encounter (Signed)
Pt left VM:Saw Dr.Arfeen 2 weeks ago.Was given medicine for anxiety.Has had some major issues in past few days.Will run out of medicine by Friday.Appt on 3/19.Can she get supply to last until appt? Contacted patient: Has had several panic attacks in past few days,with increased B/P and chest pain.Had to take more Ativan and only has two pills left. Informed pt that information will be placed in MD "In Basket".He will return to office on Thursday.

## 2014-01-21 ENCOUNTER — Other Ambulatory Visit (HOSPITAL_COMMUNITY): Payer: Self-pay | Admitting: Psychiatry

## 2014-01-21 ENCOUNTER — Telehealth (HOSPITAL_COMMUNITY): Payer: Self-pay | Admitting: Psychiatry

## 2014-01-21 ENCOUNTER — Other Ambulatory Visit (HOSPITAL_COMMUNITY): Payer: Self-pay | Admitting: *Deleted

## 2014-01-21 DIAGNOSIS — F411 Generalized anxiety disorder: Secondary | ICD-10-CM

## 2014-01-21 DIAGNOSIS — F410 Panic disorder [episodic paroxysmal anxiety] without agoraphobia: Secondary | ICD-10-CM

## 2014-01-21 MED ORDER — LORAZEPAM 0.5 MG PO TABS: 0.5000 mg | ORAL_TABLET | ORAL | Status: DC | PRN

## 2014-01-21 NOTE — Telephone Encounter (Signed)
Dr. Adele Schilder requested RX for Ativan be called to patient pharmacy.

## 2014-01-21 NOTE — Telephone Encounter (Signed)
I returned patient's phone call.  She is complaining of increased anxiety and panic attacks while working at [employer].  She was given 15 tablets of Ativan which she has used and felt very well .  However she has her next appointment not until end of this month. Patient feel the Ativan is working well.  I recommended to use only if she has severe panic attack.  We will provide another 15 tablets however she will not take Ativan when she is not at work.  Discuss the side effects especially abuse, dependence and withdrawal symptoms.

## 2014-01-26 ENCOUNTER — Telehealth (HOSPITAL_COMMUNITY): Payer: Self-pay | Admitting: Psychiatry

## 2014-01-26 ENCOUNTER — Telehealth: Payer: Self-pay | Admitting: Family Medicine

## 2014-01-26 ENCOUNTER — Ambulatory Visit (INDEPENDENT_AMBULATORY_CARE_PROVIDER_SITE_OTHER): Payer: BC Managed Care – PPO | Admitting: Family Medicine

## 2014-01-26 ENCOUNTER — Telehealth (HOSPITAL_COMMUNITY): Payer: Self-pay | Admitting: *Deleted

## 2014-01-26 ENCOUNTER — Encounter: Payer: Self-pay | Admitting: Family Medicine

## 2014-01-26 VITALS — BP 124/78 | Temp 98.0°F | Wt 133.0 lb

## 2014-01-26 DIAGNOSIS — J329 Chronic sinusitis, unspecified: Secondary | ICD-10-CM

## 2014-01-26 DIAGNOSIS — F411 Generalized anxiety disorder: Secondary | ICD-10-CM

## 2014-01-26 DIAGNOSIS — F410 Panic disorder [episodic paroxysmal anxiety] without agoraphobia: Secondary | ICD-10-CM

## 2014-01-26 MED ORDER — DOXYCYCLINE HYCLATE 100 MG PO TABS: 100.0000 mg | ORAL_TABLET | Freq: Two times a day (BID) | ORAL | Status: DC

## 2014-01-26 MED ORDER — LORAZEPAM 0.5 MG PO TABS: 0.5000 mg | ORAL_TABLET | ORAL | Status: DC | PRN

## 2014-01-26 NOTE — Patient Instructions (Signed)
-  As we discussed, we have prescribed a new medication (an antibiotic doxycycline for your sinus infection) for you at this appointment. We discussed the common and serious potential adverse effects of this medication and you can review these and more with the pharmacist when you pick up your medication.  Please follow the instructions for use carefully and notify us immediately if you have any problems taking this medication.  -follow up with your psychiatrist regarding your stress and anxiety - if your anxiety results in the need for a longer leave of absence would defer to your psychiatrist to determine if this is medically necessary.  -follow up as needed

## 2014-01-26 NOTE — Progress Notes (Signed)
Pre visit review using our clinic review tool, if applicable. No additional management support is needed unless otherwise documented below in the visit note. 
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
## 2014-01-26 NOTE — Telephone Encounter (Signed)
Patient left VM @ 1242:Saw primary MD, Dr.Kim this morning. B/P was increased.MD wanted pt to call Dr. Adele Schilder to let him know about increase in stress and anxiety. Marland Kitchen Contacted patient @ 1504:Her head was hurting her for last few days.B/P up.Dr.Kim asked her to call.Patient states it is job stress.States she will be transferred back to her old job in one week.Has been taking 3 Klonopin/day for past few days due to stress.Has 2 Klonopin left. Told her MD would receive this information electronically.

## 2014-01-26 NOTE — Telephone Encounter (Signed)
Relevant patient education mailed to patient.  

## 2014-01-26 NOTE — Telephone Encounter (Signed)
I returned patient's phone call.  She is complaining of increased headaches, increased or pressure cause of the stress and working at her job.  She is using Ativan 2-3 times a day however she is relieved that next week she will go back to her previous job.  Patient admitted that she ran out of her Ativan because she is taking 2-3 times a day.  Patient was never told to take 2-3 times a day and she apologized.  We will provide 10 more tablets of Ativan until I see her next week.  I discussed benzodiazepine dependence, tolerance and withdrawal symptoms.

## 2014-01-27 ENCOUNTER — Telehealth: Payer: Self-pay

## 2014-01-27 NOTE — Telephone Encounter (Signed)
Pt dropped off the letter from her appointment on yesterday and states " I will not be using his as I'm not mentally ill.  This situation is stress related as well as anxiety.   By law employer does not need to know exact reason.  I would not get any job with this on it."  The letter typed for pt stated "we advise that she remain out of work for 1 week due to medical illness".  Per Dr. Julianne Rice request called to make sure she read the letter correctly and to see if we can change the word to medical necessity. Called and spoke with pt and pt states she read the letter incorrectly. I will reprint the letter and put it up front.

## 2014-01-28 ENCOUNTER — Ambulatory Visit (HOSPITAL_COMMUNITY): Payer: Self-pay | Admitting: Psychiatry

## 2014-02-02 ENCOUNTER — Ambulatory Visit (HOSPITAL_COMMUNITY): Payer: Self-pay | Admitting: Psychiatry

## 2014-02-03 ENCOUNTER — Ambulatory Visit (INDEPENDENT_AMBULATORY_CARE_PROVIDER_SITE_OTHER): Payer: BC Managed Care – PPO | Admitting: Psychiatry

## 2014-02-03 ENCOUNTER — Encounter (HOSPITAL_COMMUNITY): Payer: Self-pay | Admitting: Psychiatry

## 2014-02-03 VITALS — BP 144/80 | HR 72 | Ht 62.0 in | Wt 131.4 lb

## 2014-02-03 DIAGNOSIS — F410 Panic disorder [episodic paroxysmal anxiety] without agoraphobia: Secondary | ICD-10-CM

## 2014-02-03 DIAGNOSIS — F411 Generalized anxiety disorder: Secondary | ICD-10-CM

## 2014-02-03 MED ORDER — LORAZEPAM 0.5 MG PO TABS: 0.5000 mg | ORAL_TABLET | ORAL | Status: DC | PRN

## 2014-02-03 MED ORDER — SERTRALINE HCL 50 MG PO TABS: ORAL_TABLET | ORAL | Status: DC
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
## 2014-02-08 ENCOUNTER — Telehealth (HOSPITAL_COMMUNITY): Payer: Self-pay | Admitting: *Deleted

## 2014-02-08 DIAGNOSIS — F410 Panic disorder [episodic paroxysmal anxiety] without agoraphobia: Secondary | ICD-10-CM

## 2014-02-08 DIAGNOSIS — Z0279 Encounter for issue of other medical certificate: Secondary | ICD-10-CM

## 2014-02-08 DIAGNOSIS — F411 Generalized anxiety disorder: Secondary | ICD-10-CM

## 2014-02-08 NOTE — Telephone Encounter (Signed)
Patient left VM:Saw MD last week, so he will understand what she means.Has to stay at old job untilSaturday.Needs more medicine for her anxietyAnxiety will increase and will be out of medicine by Tuesday.

## 2014-02-09 ENCOUNTER — Telehealth (HOSPITAL_COMMUNITY): Payer: Self-pay | Admitting: *Deleted

## 2014-02-09 MED ORDER — LORAZEPAM 0.5 MG PO TABS: 0.5000 mg | ORAL_TABLET | ORAL | Status: DC | PRN

## 2014-02-09 NOTE — Telephone Encounter (Signed)
Notified patient that MD authorized 10 tablets of Ativan with same directions

## 2014-02-09 NOTE — Addendum Note (Signed)
Addended by: Rolland Bimler on: 02/09/2014 05:09 PM   Modules accepted: Orders

## 2014-02-09 NOTE — Telephone Encounter (Signed)
Patient has left two additional messages today regarding the need for anxiety medicine. Contacted patient @ 1324:Patient states she has to stay in old job until Saturday and it increased her anxiety.her anxiety medicine runs out today.Informed her message would be given to MD.

## 2014-02-09 NOTE — Telephone Encounter (Addendum)
Per Dr. Adele Schilder: May give 10 more Ativan 0.5 mg with same directions.

## 2014-02-16 ENCOUNTER — Telehealth (HOSPITAL_COMMUNITY): Payer: Self-pay | Admitting: *Deleted

## 2014-02-16 DIAGNOSIS — F319 Bipolar disorder, unspecified: Secondary | ICD-10-CM

## 2014-02-16 NOTE — Telephone Encounter (Signed)
Patient left CM:KLKJZ better overall.Out of Buspar.Dr.Kim wants her to take it, but wants Dr. Adele Schilder to prescribe it.if he wants her to take it, wuill need to send new RX to [facility].  Contacted pt and left message. Informed pt that MD covering for Dr.Arfeen will need to review chart to decide about Buspar,as last prescription in [facility] May 2014 for 30 days w/3 refills,although may have have had more recent RX.Advised her will talk with MD in AM and contact her.

## 2014-02-17 MED ORDER — BUSPIRONE HCL 15 MG PO TABS: 15.0000 mg | ORAL_TABLET | Freq: Three times a day (TID) | ORAL | Status: DC

## 2014-02-17 NOTE — Telephone Encounter (Signed)
Dr. Salem Senate (in Dr. Marguerite Olea absence) reviewed pt request for Buspar 15 mg TID and authorized 30 day supply. Further refills to be reviewed at next appt

## 2014-03-08 ENCOUNTER — Encounter (HOSPITAL_COMMUNITY): Payer: Self-pay | Admitting: Psychiatry

## 2014-03-08 ENCOUNTER — Ambulatory Visit (INDEPENDENT_AMBULATORY_CARE_PROVIDER_SITE_OTHER): Payer: BC Managed Care – PPO | Admitting: Psychiatry

## 2014-03-08 VITALS — BP 144/80 | HR 77 | Ht 61.0 in | Wt 134.0 lb

## 2014-03-08 DIAGNOSIS — F411 Generalized anxiety disorder: Secondary | ICD-10-CM

## 2014-03-08 DIAGNOSIS — F410 Panic disorder [episodic paroxysmal anxiety] without agoraphobia: Secondary | ICD-10-CM

## 2014-03-08 MED ORDER — LORAZEPAM 0.5 MG PO TABS: 0.5000 mg | ORAL_TABLET | ORAL | Status: DC | PRN

## 2014-03-08 MED ORDER — SERTRALINE HCL 100 MG PO TABS: ORAL_TABLET | ORAL | Status: DC
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
## 2014-03-09 ENCOUNTER — Telehealth: Payer: Self-pay | Admitting: Family Medicine

## 2014-03-09 MED ORDER — BISOPROLOL-HYDROCHLOROTHIAZIDE 5-6.25 MG PO TABS: 1.0000 | ORAL_TABLET | Freq: Every day | ORAL | Status: DC

## 2014-03-09 NOTE — Telephone Encounter (Signed)
Pt req rx on bisoprolol-hydrochlorothiazide (ZIAC) 5-6.25 MG per tablet   pharmacy   [place]  [phone]

## 2014-03-09 NOTE — Telephone Encounter (Signed)
Pt requested refill on ziac 5-6.25 qty 90 3 rfs, med was sent to pharmacy

## 2014-03-23 ENCOUNTER — Telehealth (HOSPITAL_COMMUNITY): Payer: Self-pay | Admitting: *Deleted

## 2014-03-23 NOTE — Telephone Encounter (Signed)
Patient left WP:[id number] refill of Lorazepam sent to [facility]

## 2014-03-24 ENCOUNTER — Telehealth (HOSPITAL_COMMUNITY): Payer: Self-pay | Admitting: *Deleted

## 2014-03-24 NOTE — Telephone Encounter (Signed)
Patient left YQ:MGNO message earlier in week requesting refill of Lorazepam. Will it be refilled?

## 2014-03-24 NOTE — Telephone Encounter (Addendum)
Left message for pt:Dr.Arfeen wants to know how she is taking medication and how she is doing before refill.He last gave RX on 4/27 for 10 pills as he thought she was taking rarely.Asked her to contact office at her convenience

## 2014-03-25 NOTE — Telephone Encounter (Signed)
Patient left VM:19 days since last prescription. Has not been taking excessively, but has been taking.Only received 10 tablets.Stress is improved, but still there.Discussed with Dr. Adele Schilder at last appt.Caregiver for father a stressor.

## 2014-04-19 ENCOUNTER — Encounter (HOSPITAL_COMMUNITY): Payer: Self-pay | Admitting: Psychiatry

## 2014-04-19 ENCOUNTER — Ambulatory Visit (INDEPENDENT_AMBULATORY_CARE_PROVIDER_SITE_OTHER): Payer: BC Managed Care – PPO | Admitting: Psychiatry

## 2014-04-19 DIAGNOSIS — F411 Generalized anxiety disorder: Secondary | ICD-10-CM

## 2014-04-19 DIAGNOSIS — F410 Panic disorder [episodic paroxysmal anxiety] without agoraphobia: Secondary | ICD-10-CM

## 2014-04-19 MED ORDER — LORAZEPAM 0.5 MG PO TABS: 0.5000 mg | ORAL_TABLET | ORAL | Status: DC | PRN

## 2014-04-19 MED ORDER — SERTRALINE HCL 50 MG PO TABS: 50.0000 mg | ORAL_TABLET | Freq: Every day | ORAL | Status: DC
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
## 2014-06-09 ENCOUNTER — Telehealth (HOSPITAL_COMMUNITY): Payer: Self-pay | Admitting: *Deleted

## 2014-06-09 DIAGNOSIS — F411 Generalized anxiety disorder: Secondary | ICD-10-CM

## 2014-06-09 DIAGNOSIS — F410 Panic disorder [episodic paroxysmal anxiety] without agoraphobia: Secondary | ICD-10-CM

## 2014-06-09 MED ORDER — LORAZEPAM 0.5 MG PO TABS: 0.5000 mg | ORAL_TABLET | ORAL | Status: DC | PRN

## 2014-06-09 NOTE — Telephone Encounter (Addendum)
Per Dr. Adele Schilder, may call in prescription of Lorazepam for pt (#15 tablets) Advise pt MD will discuss at next appt

## 2014-06-09 NOTE — Addendum Note (Signed)
Addended by: Rolland Bimler on: 06/09/2014 05:02 PM   Modules accepted: Orders
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
## 2014-06-21 ENCOUNTER — Encounter (HOSPITAL_COMMUNITY): Payer: Self-pay | Admitting: Psychiatry

## 2014-06-21 ENCOUNTER — Ambulatory Visit (INDEPENDENT_AMBULATORY_CARE_PROVIDER_SITE_OTHER): Payer: BC Managed Care – PPO | Admitting: Psychiatry

## 2014-06-21 VITALS — BP 144/89 | HR 65 | Wt 135.0 lb

## 2014-06-21 DIAGNOSIS — F411 Generalized anxiety disorder: Secondary | ICD-10-CM

## 2014-06-21 DIAGNOSIS — F410 Panic disorder [episodic paroxysmal anxiety] without agoraphobia: Secondary | ICD-10-CM

## 2014-06-21 MED ORDER — MIRTAZAPINE 15 MG PO TABS: 15.0000 mg | ORAL_TABLET | Freq: Every day | ORAL | Status: DC

## 2014-06-21 MED ORDER — LORAZEPAM 0.5 MG PO TABS: 0.5000 mg | ORAL_TABLET | ORAL | Status: DC | PRN
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
## 2014-07-05 ENCOUNTER — Ambulatory Visit (HOSPITAL_COMMUNITY): Payer: Self-pay | Admitting: Licensed Clinical Social Worker

## 2014-07-05 ENCOUNTER — Telehealth (HOSPITAL_COMMUNITY): Payer: Self-pay | Admitting: Licensed Clinical Social Worker

## 2014-07-05 NOTE — Telephone Encounter (Signed)
LCSW contacted patient by phone as a follow up to missed initial appointment.  Phone went straight to voicemail. LCSW left message for patient to return call return call regarding their appointment for [name] that was today.

## 2014-07-06 ENCOUNTER — Ambulatory Visit (INDEPENDENT_AMBULATORY_CARE_PROVIDER_SITE_OTHER): Payer: BC Managed Care – PPO | Admitting: Family Medicine

## 2014-07-06 ENCOUNTER — Encounter: Payer: Self-pay | Admitting: Family Medicine

## 2014-07-06 VITALS — BP 130/82 | HR 73 | Temp 99.2°F | Ht 61.0 in | Wt 130.5 lb

## 2014-07-06 DIAGNOSIS — R7303 Prediabetes: Secondary | ICD-10-CM

## 2014-07-06 DIAGNOSIS — K089 Disorder of teeth and supporting structures, unspecified: Secondary | ICD-10-CM

## 2014-07-06 DIAGNOSIS — R7309 Other abnormal glucose: Secondary | ICD-10-CM

## 2014-07-06 DIAGNOSIS — R5381 Other malaise: Secondary | ICD-10-CM

## 2014-07-06 DIAGNOSIS — I10 Essential (primary) hypertension: Secondary | ICD-10-CM

## 2014-07-06 DIAGNOSIS — E785 Hyperlipidemia, unspecified: Secondary | ICD-10-CM

## 2014-07-06 DIAGNOSIS — R5383 Other fatigue: Secondary | ICD-10-CM

## 2014-07-06 DIAGNOSIS — K0889 Other specified disorders of teeth and supporting structures: Secondary | ICD-10-CM

## 2014-07-06 LAB — BASIC METABOLIC PANEL
BUN: 7 mg/dL (ref 6–23)
CALCIUM: 9.3 mg/dL (ref 8.4–10.5)
CO2: 31 mEq/L (ref 19–32)
Chloride: 93 mEq/L — ABNORMAL LOW (ref 96–112)
Creatinine, Ser: 0.8 mg/dL (ref 0.4–1.2)
GFR: 81.03 mL/min (ref >60.00)
Glucose, Bld: 76 mg/dL (ref 70–99)
Potassium: 4.2 mEq/L (ref 3.5–5.1)
Sodium: 132 mEq/L — ABNORMAL LOW (ref 135–145)

## 2014-07-06 LAB — CBC WITH DIFFERENTIAL/PLATELET
BASOS ABS: 0.1 10*3/uL (ref 0.0–0.1)
Basophils Relative: 0.9 % (ref 0.0–3.0)
EOS ABS: 0.2 10*3/uL (ref 0.0–0.7)
EOS PCT: 2 % (ref 0.0–5.0)
HCT: 41.4 % (ref 36.0–46.0)
Hemoglobin: 13.6 g/dL (ref 12.0–15.0)
LYMPHS ABS: 3.2 10*3/uL (ref 0.7–4.0)
Lymphocytes Relative: 36.6 % (ref 12.0–46.0)
MCHC: 32.8 g/dL (ref 30.0–36.0)
MCV: 90.4 fl (ref 78.0–100.0)
MONO ABS: 0.7 10*3/uL (ref 0.1–1.0)
Monocytes Relative: 7.7 % (ref 3.0–12.0)
NEUTROS PCT: 52.8 % (ref 43.0–77.0)
Neutro Abs: 4.6 10*3/uL (ref 1.4–7.7)
PLATELETS: 412 10*3/uL — AB (ref 150.0–400.0)
RBC: 4.58 Mil/uL (ref 3.87–5.11)
RDW: 13.4 % (ref 11.5–15.5)
WBC: 8.7 10*3/uL (ref 4.0–10.5)

## 2014-07-06 LAB — MICROALBUMIN / CREATININE URINE RATIO
Creatinine,U: 26.9 mg/dL
MICROALB UR: 0.8 mg/dL (ref 0.0–1.9)
Microalb Creat Ratio: 3 mg/g (ref 0.0–30.0)

## 2014-07-06 LAB — HEMOGLOBIN A1C: Hgb A1c MFr Bld: 6.4 % (ref 4.6–6.5)

## 2014-07-06 LAB — TSH: TSH: 0.72 u[IU]/mL (ref 0.35–4.50)

## 2014-07-06 LAB — T4, FREE: Free T4: 1.01 ng/dL (ref 0.60–1.60)

## 2014-07-06 NOTE — Patient Instructions (Addendum)
-  see your oral surgeon about the dental pain and facial issues - likely related to your teeth, but may need to see your ear nose and throat doctor if not improving  -see your psychiatrist as scheduled  -We have ordered labs or studies at this visit. It can take up to 1-2 weeks for results and processing. We will contact you with instructions IF your results are abnormal. Normal results will be released to your [facility]. If you have not heard from [place] or can not find your results in [facility] in 2 weeks please contact our office.

## 2014-07-06 NOTE — Progress Notes (Signed)
No chief complaint on file.   HPI:    1)Malaise/Anxiety/Fatigue: -for last 1-2 weeks -a mild cough -report "shit load of stuff going on" -with dental issues - stressed out and was so anxious about the dental work -wast to check diabetes labs -has not been sleeping well with dental issues, stopped one of her psych meds last week, seeing psych next week -denies: fevers, mucus production, dysuria, CP, SOB, DOE, depression, SI  2)Dental Pain: -had oral surgery yesterday for dental infection, face feels a little different -on antibiotics (penicillin) -can't take the pain meds - has been taking them some -LAD - reports oral surgeon told her it is from her tooth and has close follow up -she worries about her sinus because her oral surgeon reported this was close to sinus cavity   ROS: See pertinent positives and negatives per HPI.  Past Medical History  Diagnosis Date  . CHF (congestive heart failure)   . GERD (gastroesophageal reflux disease)   . Hypertension   . Acid reflux   . Ulcer   . Heart murmur     Past Surgical History  Procedure Laterality Date  . Tonsillectomy and adenoidectomy    . Shoulder repalcement    . Abdominal hysterectomy      complete for fibroids    Family History  Problem Relation Age of Onset  . Prostate cancer Father   . Colon cancer Maternal Grandmother     History   Social History  . Marital Status: Single    Spouse Name: N/A    Number of Children: N/A  . Years of Education: N/A   Social History Main Topics  . Smoking status: Current Every Day Smoker    Types: Cigarettes  . Smokeless tobacco: Never Used     Comment: almost a pack a day;   . Alcohol Use: No  . Drug Use: No  . Sexual Activity: None   Other Topics Concern  . None   Social History Narrative   She does have BS degree from [employer]. 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 She is very  Much afraid of losing her job and is very concerned  about this. She has supportive friends and ex- sponsors    Current outpatient prescriptions:acetaminophen-codeine (TYLENOL #3) 300-30 MG per tablet, every 6 (six) hours as needed. Due to oral surgery 07/05/2014, Disp: , Rfl: ;  bisoprolol-hydrochlorothiazide (ZIAC) 5-6.25 MG per tablet, Take 1 tablet by mouth daily., Disp: 90 tablet, Rfl: 3;  lisinopril (PRINIVIL,ZESTRIL) 40 MG tablet, Take 1 tablet (40 mg total) by mouth daily., Disp: 90 tablet, Rfl: 2 LORazepam (ATIVAN) 0.5 MG tablet, Take 1 tablet (0.5 mg total) by mouth as needed for anxiety (use very sparingly)., Disp: 15 tablet, Rfl: 0;  omeprazole (PRILOSEC OTC) 20 MG tablet, Take 20 mg by mouth daily., Disp: , Rfl: ;  penicillin v potassium (VEETID) 500 MG tablet, Take 500 mg by mouth 3 (three) times daily. Due to oral surgery 07/05/2014, Disp: , Rfl:   EXAM:  Filed Vitals:   07/06/14 1051  BP: 130/82  Pulse: 73  Temp: 99.2 F (37.3 C)    Body mass index is 24.67 kg/(m^2).  GENERAL: vitals reviewed and listed above, alert, oriented, appears well hydrated and in no acute distress  HEENT: atraumatic, conjunttiva clear, no obvious abnormalities on inspection of external nose and ears  NECK: no obvious masses on inspection; L ant cervical LAD  LUNGS: clear to auscultation bilaterally, no wheezes,  rales or rhonchi, good air movement  CV: HRRR, no peripheral edema  MS: moves all extremities without noticeable abnormality  PSYCH: pleasant and cooperative, no obvious depression or anxiety  ASSESSMENT AND PLAN:  Discussed the following assessment and plan:  Essential hypertension - Plan: Basic metabolic panel  Prediabetes - Plan: Hemoglobin K5L, Basic metabolic panel, Microalbumin/Creatinine Ratio, Urine  Hyperlipidemia  Other malaise and fatigue - Plan: CBC with Differential, TSH, T4, Free  Pain, dental:  -multiple issues with recent dental issues and surgery for this yesterday - suspect tooth, facial discomfort and LAD  from this and advised she call her oral surgeon today, discussed other possibilities -basic labs per her request -advised she needs to see her psychiatrist as scheduled for help with anxiety -follow up 1 month, sooner if needed -Patient advised to return or notify a doctor immediately if symptoms worsen or persist or new concerns arise.  Patient Instructions  -see your oral surgeon about the dental pain and facial issues - likely related to your teeth, but may need to see your ear nose and throat doctor if not improving  -see your psychiatrist as scheduled  -We have ordered labs or studies at this visit. It can take up to 1-2 weeks for results and processing. We will contact you with instructions IF your results are abnormal. Normal results will be released to your [facility]. If you have not heard from [place] or can not find your results in [facility] in 2 weeks please contact our office.            Colin Benton R.

## 2014-07-06 NOTE — Progress Notes (Signed)
Pre visit review using our clinic review tool, if applicable. No additional management support is needed unless otherwise documented below in the visit note. 

## 2014-07-07 ENCOUNTER — Telehealth: Payer: Self-pay | Admitting: Family Medicine

## 2014-07-07 NOTE — Telephone Encounter (Signed)
Relevant patient education mailed to patient.  

## 2014-07-12 ENCOUNTER — Ambulatory Visit (INDEPENDENT_AMBULATORY_CARE_PROVIDER_SITE_OTHER): Payer: BC Managed Care – PPO | Admitting: Psychiatry

## 2014-07-12 ENCOUNTER — Encounter (HOSPITAL_COMMUNITY): Payer: Self-pay | Admitting: Psychiatry

## 2014-07-12 VITALS — BP 155/86 | HR 82 | Ht 62.0 in | Wt 129.8 lb

## 2014-07-12 DIAGNOSIS — F410 Panic disorder [episodic paroxysmal anxiety] without agoraphobia: Secondary | ICD-10-CM

## 2014-07-12 DIAGNOSIS — F411 Generalized anxiety disorder: Secondary | ICD-10-CM

## 2014-07-12 MED ORDER — LORAZEPAM 0.5 MG PO TABS
0.5000 mg | ORAL_TABLET | ORAL | Status: DC | PRN
Start: 2014-07-12 — End: 2014-08-05

## 2014-07-12 NOTE — Progress Notes (Signed)
[facility] [phone] Progress Note   [name] [id number] 63 y.o.  07/12/2014 12:27 PM  Chief Complaint:  I want to try Zoloft again.       History of Present Illness:  [name] came for her followup appointment.  On her last visit we recommended to try Remeron because she was afraid to take Zoloft.  She recently had toothache and she was given narcotic pain medication and she was afraid to take Remeron with it .she had decided to go back on Zoloft.  She is taking Ativan only as needed.  She mentioned that her anxiety was very stressful when she was seeing a dentist .  However she is much calmer now when she is using Xanax only as needed.  She denies any major panic attack but feels nervous and anxious most of the time.  She like to try Zoloft 25 mg twice a day to avoid any side effects in the beginning.  Recently she seen her primary care physician in her blood work was normal.  She denies any crying spells.  She lives by herself.  Patient is taking care of her elderly father who has dementia.  Patient does not drink or use any illegal substances.  Suicidal Ideation: No Plan Formed: No Patient has means to carry out plan: No  Homicidal Ideation: No Plan Formed: No Patient has means to carry out plan: No  Past Psychiatric History/Hospitalization(s) Patient has history of anxiety disorder since age 36.  She has taken BuSpar in the past with limited response.  We had tried Zoloft the patient do not tolerate more than 25 mg because of GI side effects.   Anxiety: Yes Bipolar Disorder: No Depression: No Mania: No Psychosis: No Schizophrenia: No Personality Disorder: No Hospitalization for psychiatric illness: No History of Electroconvulsive Shock Therapy: No Prior Suicide Attempts: No  Medical History; Patient has GERD, hypertension, acid reflux, also in heart murmur.  She has history of tonsillectomy and shoulder replacement.  Her primary care physician is Dr. Colin Benton.    Review of Systems: Psychiatric: Agitation: No Hallucination: No Depressed Mood: Yes Insomnia: Yes Hypersomnia: No Altered Concentration: No Feels Worthless: No Grandiose Ideas: No Belief In Special Powers: No New/Increased Substance Abuse: No Compulsions: No  Neurologic: Headache: No Seizure: No Paresthesias: No    Outpatient Encounter Prescriptions as of 07/12/2014  Medication Sig  . sertraline (ZOLOFT) 50 MG tablet Take 50 mg by mouth daily.  Marland Kitchen acetaminophen-codeine (TYLENOL #3) 300-30 MG per tablet every 6 (six) hours as needed. Due to oral surgery 07/05/2014  . bisoprolol-hydrochlorothiazide (ZIAC) 5-6.25 MG per tablet Take 1 tablet by mouth daily.  Marland Kitchen lisinopril (PRINIVIL,ZESTRIL) 40 MG tablet Take 1 tablet (40 mg total) by mouth daily.  Marland Kitchen LORazepam (ATIVAN) 0.5 MG tablet Take 1 tablet (0.5 mg total) by mouth as needed for anxiety (use very sparingly).  Marland Kitchen omeprazole (PRILOSEC OTC) 20 MG tablet Take 20 mg by mouth daily.  . penicillin v potassium (VEETID) 500 MG tablet Take 500 mg by mouth 3 (three) times daily. Due to oral surgery 07/05/2014  . [DISCONTINUED] LORazepam (ATIVAN) 0.5 MG tablet Take 1 tablet (0.5 mg total) by mouth as needed for anxiety (use very sparingly).    Recent Results (from the past 2160 hour(s))  HEMOGLOBIN A1C     Status: None   Collection Time    07/06/14 11:22 AM      Result Value Ref Range   Hemoglobin A1C 6.4  4.6 - 6.5 %  Comment: Glycemic Control Guidelines for People with Diabetes:Non Diabetic:  <6%Goal of Therapy: <7%Additional Action Suggested:  >3%   BASIC METABOLIC PANEL     Status: Abnormal   Collection Time    07/06/14 11:22 AM      Result Value Ref Range   Sodium 132 (*) 135 - 145 mEq/L   Potassium 4.2  3.5 - 5.1 mEq/L   Chloride 93 (*) 96 - 112 mEq/L   CO2 31  19 - 32 mEq/L   Glucose, Bld 76  70 - 99 mg/dL   BUN 7  6 - 23 mg/dL   Creatinine, Ser 0.8  0.4 - 1.2 mg/dL   Calcium 9.3  8.4 - 10.5 mg/dL   GFR 81.03  >60.00  mL/min  MICROALBUMIN / CREATININE URINE RATIO     Status: None   Collection Time    07/06/14 11:22 AM      Result Value Ref Range   Microalb, Ur 0.8  0.0 - 1.9 mg/dL   Creatinine,U 26.9     Microalb Creat Ratio 3.0  0.0 - 30.0 mg/g  CBC WITH DIFFERENTIAL     Status: Abnormal   Collection Time    07/06/14 11:22 AM      Result Value Ref Range   WBC 8.7  4.0 - 10.5 K/uL   RBC 4.58  3.87 - 5.11 Mil/uL   Hemoglobin 13.6  12.0 - 15.0 g/dL   HCT 41.4  36.0 - 46.0 %   MCV 90.4  78.0 - 100.0 fl   MCHC 32.8  30.0 - 36.0 g/dL   RDW 13.4  11.5 - 15.5 %   Platelets 412.0 (*) 150.0 - 400.0 K/uL   Neutrophils Relative % 52.8  43.0 - 77.0 %   Lymphocytes Relative 36.6  12.0 - 46.0 %   Monocytes Relative 7.7  3.0 - 12.0 %   Eosinophils Relative 2.0  0.0 - 5.0 %   Basophils Relative 0.9  0.0 - 3.0 %   Neutro Abs 4.6  1.4 - 7.7 K/uL   Lymphs Abs 3.2  0.7 - 4.0 K/uL   Monocytes Absolute 0.7  0.1 - 1.0 K/uL   Eosinophils Absolute 0.2  0.0 - 0.7 K/uL   Basophils Absolute 0.1  0.0 - 0.1 K/uL  TSH     Status: None   Collection Time    07/06/14 11:22 AM      Result Value Ref Range   TSH 0.72  0.35 - 4.50 uIU/mL  T4, FREE     Status: None   Collection Time    07/06/14 11:22 AM      Result Value Ref Range   Free T4 1.01  0.60 - 1.60 ng/dL      Physical Exam: Constitutional:  BP 155/86  Pulse 82  Ht 5\' 2"  (1.575 m)  Wt 129 lb 12.8 oz (58.877 kg)  BMI 23.73 kg/m2  Musculoskeletal: Strength & Muscle Tone: within normal limits Gait & Station: normal Patient leans: N/A  Mental Status Examination;  Patient is casually dressed and well groomed.  She is anxious but cooperative.  She described her mood as anxious and her affect is mood appropriate.  Her speech is fast but clear and coherent. Her thought processes logical and goal-directed.  She denies any auditory or visual hallucination.  She denies any active or passive suicidal thoughts or homicidal thoughts.  Her psychomotor activity is  slightly increased.  Her attention and concentration is okay.  There were no delusions, paranoia or  any obsessive thoughts.  Her thought of knowledge is good.  Her immediate and remote memory is good.  She is alert and oriented x3.  Her insight judgment and impulse control is okay.   Established Problem, Stable/Improving (1), Review of Psycho-Social Stressors (1), Review or order clinical lab tests (1), Review of Last Therapy Session (1), Review of Medication Regimen & Side Effects (2) and Review of New Medication or Change in Dosage (2)  Assessment: Axis I: Generalized anxiety disorder,  Axis II: Deferred  Axis III:  Past Medical History  Diagnosis Date  . CHF (congestive heart failure)   . GERD (gastroesophageal reflux disease)   . Hypertension   . Acid reflux   . Ulcer   . Heart murmur     Axis IV: Mild to moderate   Plan:  I will discontinue Remeron since patient has decided to go back on Zoloft.  I also reviewed her blood work including her hemoglobin A1c.  She had missed appointment with a therapist but she schedules again .  Discuss in detail the risks and benefits of medication especially in the beginning medicine can cause nonspecific side effects.  Continue Ativan for severe anxiety and panic attack.  I will see her again in 6 weeks. Time spent 25 minutes.  More than 50% of the time spent in psychoeducation, counseling and coordination of care.  Discuss safety plan that anytime having active suicidal thoughts or homicidal thoughts then patient need to call 911 or go to the local emergency room.  Raed Schalk T., MD 07/12/2014

## 2014-07-16 ENCOUNTER — Ambulatory Visit (INDEPENDENT_AMBULATORY_CARE_PROVIDER_SITE_OTHER): Payer: BC Managed Care – PPO | Admitting: Licensed Clinical Social Worker

## 2014-07-16 ENCOUNTER — Encounter (HOSPITAL_COMMUNITY): Payer: Self-pay | Admitting: Licensed Clinical Social Worker

## 2014-07-16 DIAGNOSIS — F411 Generalized anxiety disorder: Secondary | ICD-10-CM | POA: Diagnosis not present
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
## 2014-08-05 ENCOUNTER — Telehealth (HOSPITAL_COMMUNITY): Payer: Self-pay | Admitting: *Deleted

## 2014-08-05 DIAGNOSIS — F410 Panic disorder [episodic paroxysmal anxiety] without agoraphobia: Secondary | ICD-10-CM

## 2014-08-05 DIAGNOSIS — F411 Generalized anxiety disorder: Secondary | ICD-10-CM

## 2014-08-05 MED ORDER — LORAZEPAM 0.5 MG PO TABS: 0.5000 mg | ORAL_TABLET | ORAL | Status: DC | PRN

## 2014-08-05 NOTE — Telephone Encounter (Signed)
Pt left VM received on 9/24 @ 0945: Anxiety high again.Going to [place] over weekend to take care of mother's ashes. Could she refill her Lorazepam? Gave information to Dr. Salem Senate (in Dr. Marguerite Olea absence), per Dr. Juliane Lack order: may give same RX of  Lorazepam last called in on 07/12/14.

## 2014-08-20 ENCOUNTER — Ambulatory Visit (INDEPENDENT_AMBULATORY_CARE_PROVIDER_SITE_OTHER): Payer: BC Managed Care – PPO | Admitting: Licensed Clinical Social Worker

## 2014-08-20 DIAGNOSIS — F411 Generalized anxiety disorder: Secondary | ICD-10-CM | POA: Diagnosis not present

## 2014-08-20 NOTE — Progress Notes (Signed)
   THERAPIST PROGRESS NOTE  Session Time: :10:03 AM -10:38 AM  Participation Level: Minimal  Behavioral Response: Casual and NeatAlertIrritable  Type of Therapy: Individual Therapy  Treatment Goals addressed: Anxiety and Diagnosis: Generalized Anxiety Disorder  Interventions: Motivational Interviewing and Supportive  Summary: [name] is a 63 y.o. female who presents with symptoms of anxiety and reports that she has panic attacks. While reviewing the treatment plan patient reports that she does not feel that she has a mental illness. Patient reports that she has "done research" and she feels that she has a lack of serotonin in her brain, so she feels that it is a physical illness. Patient reports that she has been to counselors in the past and she does not feel that it has been helpful. Patient reports that she can go "ten to fifteen years" without experiencing anxiety. Patient reports that she feels that her anxiety is genetic, her mother had it and it can be treated with medication for the chemical imbalance. Patient reports that she has tried coping skills provided by previous counselors and it was not helpful. Patient reports that she feels that she feels that she will benefit from medication management to balance her serotonin. Patient reports that she feels that the medication was working, but it makes her "sick on the stomach." Patient reports that she will inform Dr. Adele Schilder of the side effects in her appointment Monday in hopes that he will adjust the medication for her. Patient reports that she cannot go to [facility] to follow this therapist and she feels that it may be best to continue to see Dr. Adele Schilder and call to schedule with another therapist if she feels that it is necessary. Patient denies SI/HI and psychosis. .   Suicidal/Homicidal: Nowithout intent/plan  Therapist Response: LCSW assessed the patients progress from the last visit. LCSW began to review the treatment plan  with the patient. LCSW explained the Anxiety is considered a mental illness and therapy can assist with coping with anxiety. LCSW asked the patient her perception. LCSW asked the patient where she feels that treatment should go from here if she does not feel that therapy is beneficial. LCSW explained the benefits of therapy in combination with medication management.  LCSW informed the patient that she will be discharged and if she changes her mind, she can always call to make an appointment with a therapist. LCSW informed the patient that she will be transitioning to the [facility], but let her know that she is able to contact the office to schedule an appointment with another therapist.    Plan: Patient will meet with Dr. Adele Schilder and schedule with another therapist if needed.   Diagnosis:  Generalized Anxiety Disorder   Outpatient Therapist Discharge Summary  [name] [facility]    02-Jul-1951   Admission Date: 07/16/2014  Discharge Date:  08/20/2014 Reason for Discharge:  Patient reports that she feels that she has a chemical imbalance that can be treated with medication.  Diagnosis: Generalized anxiety disorder    Comments:  Patient is aware that this LCSW is leaving the practice Patient was also informed that she could call and make an appointment with a therapist if she changes her mind.   [place] Work   Irean Hong, Marlinda Mike 08/20/2014

## 2014-08-23 ENCOUNTER — Ambulatory Visit (INDEPENDENT_AMBULATORY_CARE_PROVIDER_SITE_OTHER): Payer: BC Managed Care – PPO | Admitting: Psychiatry

## 2014-08-23 ENCOUNTER — Encounter (HOSPITAL_COMMUNITY): Payer: Self-pay | Admitting: Psychiatry

## 2014-08-23 VITALS — BP 136/80 | HR 76 | Ht 62.0 in | Wt 127.2 lb

## 2014-08-23 DIAGNOSIS — F410 Panic disorder [episodic paroxysmal anxiety] without agoraphobia: Secondary | ICD-10-CM

## 2014-08-23 DIAGNOSIS — F411 Generalized anxiety disorder: Secondary | ICD-10-CM

## 2014-08-23 MED ORDER — LORAZEPAM 0.5 MG PO TABS: 0.5000 mg | ORAL_TABLET | ORAL | Status: DC | PRN

## 2014-08-23 MED ORDER — SERTRALINE HCL 50 MG PO TABS: 50.0000 mg | ORAL_TABLET | Freq: Every day | ORAL | Status: DC

## 2014-08-23 NOTE — Progress Notes (Signed)
[facility] [phone] Progress Note   [name] [id number] 63 y.o.  08/23/2014 10:43 AM  Chief Complaint:  Medication management and followup.       History of Present Illness:  [name] came for her followup appointment.  She is taking Zoloft 50 mg half tablet twice a day on and off.  She admitted it helps her anxiety but she also had nausea .  Her appetite is okay.  Her anxiety is under control although she continues to take Ativan for severe panic attack.  She is not interested in counseling because she believes she does not have any mental illness .  She endorsed that she has anxiety which is controlled by the medication.  The patient has at least 2 major panic attack which helped like taking Ativan.  Overall her mood has been stable.  She denies any crying spells.  She denies any paranoia or any hallucinations.  She was very anxious when she went on a weekend to take care of her mother's ashes.  Patient lives by herself.  She continues to work at [employer] however she is thinking to cut down her hours in the future.  She does not drink or use any illegal substances.  She denies any side effects other than she has nausea with Zoloft.  Suicidal Ideation: No Plan Formed: No Patient has means to carry out plan: No  Homicidal Ideation: No Plan Formed: No Patient has means to carry out plan: No  Past Psychiatric History/Hospitalization(s) Patient has history of anxiety disorder since age 76.  She has taken BuSpar in the past with limited response.  We had tried Zoloft the patient do not tolerate more than 25 mg because of GI side effects.   Anxiety: Yes Bipolar Disorder: No Depression: No Mania: No Psychosis: No Schizophrenia: No Personality Disorder: No Hospitalization for psychiatric illness: No History of Electroconvulsive Shock Therapy: No Prior Suicide Attempts: No  Medical History; Patient has GERD, hypertension, acid reflux, also in heart murmur.  She has history  of tonsillectomy and shoulder replacement.  Her primary care physician is Dr. Colin Benton.   Review of Systems: Psychiatric: Agitation: No Hallucination: No Depressed Mood: Yes Insomnia: Yes Hypersomnia: No Altered Concentration: No Feels Worthless: No Grandiose Ideas: No Belief In Special Powers: No New/Increased Substance Abuse: No Compulsions: No  Neurologic: Headache: No Seizure: No Paresthesias: No    Outpatient Encounter Prescriptions as of 08/23/2014  Medication Sig  . bisoprolol-hydrochlorothiazide (ZIAC) 5-6.25 MG per tablet Take 1 tablet by mouth daily.  Marland Kitchen lisinopril (PRINIVIL,ZESTRIL) 40 MG tablet Take 1 tablet (40 mg total) by mouth daily.  Marland Kitchen LORazepam (ATIVAN) 0.5 MG tablet Take 1 tablet (0.5 mg total) by mouth as needed for anxiety (use very sparingly).  Marland Kitchen omeprazole (PRILOSEC OTC) 20 MG tablet Take 20 mg by mouth daily.  . sertraline (ZOLOFT) 50 MG tablet Take 1 tablet (50 mg total) by mouth daily.  . traMADol (ULTRAM) 50 MG tablet   . [DISCONTINUED] acetaminophen-codeine (TYLENOL #3) 300-30 MG per tablet every 6 (six) hours as needed. Due to oral surgery 07/05/2014  . [DISCONTINUED] LORazepam (ATIVAN) 0.5 MG tablet Take 1 tablet (0.5 mg total) by mouth as needed for anxiety (use very sparingly).  . [DISCONTINUED] penicillin v potassium (VEETID) 500 MG tablet Take 500 mg by mouth 3 (three) times daily. Due to oral surgery 07/05/2014  . [DISCONTINUED] sertraline (ZOLOFT) 50 MG tablet Take 50 mg by mouth daily.    Recent Results (from the past 2160  hour(s))  HEMOGLOBIN A1C     Status: None   Collection Time    07/06/14 11:22 AM      Result Value Ref Range   Hemoglobin A1C 6.4  4.6 - 6.5 %   Comment: Glycemic Control Guidelines for People with Diabetes:Non Diabetic:  <6%Goal of Therapy: <7%Additional Action Suggested:  >0%   BASIC METABOLIC PANEL     Status: Abnormal   Collection Time    07/06/14 11:22 AM      Result Value Ref Range   Sodium 132 (*) 135 - 145  mEq/L   Potassium 4.2  3.5 - 5.1 mEq/L   Chloride 93 (*) 96 - 112 mEq/L   CO2 31  19 - 32 mEq/L   Glucose, Bld 76  70 - 99 mg/dL   BUN 7  6 - 23 mg/dL   Creatinine, Ser 0.8  0.4 - 1.2 mg/dL   Calcium 9.3  8.4 - 10.5 mg/dL   GFR 81.03  >60.00 mL/min  MICROALBUMIN / CREATININE URINE RATIO     Status: None   Collection Time    07/06/14 11:22 AM      Result Value Ref Range   Microalb, Ur 0.8  0.0 - 1.9 mg/dL   Creatinine,U 26.9     Microalb Creat Ratio 3.0  0.0 - 30.0 mg/g  CBC WITH DIFFERENTIAL     Status: Abnormal   Collection Time    07/06/14 11:22 AM      Result Value Ref Range   WBC 8.7  4.0 - 10.5 K/uL   RBC 4.58  3.87 - 5.11 Mil/uL   Hemoglobin 13.6  12.0 - 15.0 g/dL   HCT 41.4  36.0 - 46.0 %   MCV 90.4  78.0 - 100.0 fl   MCHC 32.8  30.0 - 36.0 g/dL   RDW 13.4  11.5 - 15.5 %   Platelets 412.0 (*) 150.0 - 400.0 K/uL   Neutrophils Relative % 52.8  43.0 - 77.0 %   Lymphocytes Relative 36.6  12.0 - 46.0 %   Monocytes Relative 7.7  3.0 - 12.0 %   Eosinophils Relative 2.0  0.0 - 5.0 %   Basophils Relative 0.9  0.0 - 3.0 %   Neutro Abs 4.6  1.4 - 7.7 K/uL   Lymphs Abs 3.2  0.7 - 4.0 K/uL   Monocytes Absolute 0.7  0.1 - 1.0 K/uL   Eosinophils Absolute 0.2  0.0 - 0.7 K/uL   Basophils Absolute 0.1  0.0 - 0.1 K/uL  TSH     Status: None   Collection Time    07/06/14 11:22 AM      Result Value Ref Range   TSH 0.72  0.35 - 4.50 uIU/mL  T4, FREE     Status: None   Collection Time    07/06/14 11:22 AM      Result Value Ref Range   Free T4 1.01  0.60 - 1.60 ng/dL      Physical Exam: Constitutional:  BP 136/80  Pulse 76  Ht 5\' 2"  (1.575 m)  Wt 127 lb 3.2 oz (57.698 kg)  BMI 23.26 kg/m2  Musculoskeletal: Strength & Muscle Tone: within normal limits Gait & Station: normal Patient leans: N/A  Mental Status Examination;  Patient is casually dressed and well groomed.  She is anxious but cooperative.  She described her mood okay and her affect is mood appropriate.  Her  speech is fast but clear and coherent. Her thought processes logical and goal-directed.  She denies any auditory or visual hallucination.  She denies any active or passive suicidal thoughts or homicidal thoughts.  Her psychomotor activity is slightly increased.  Her attention and concentration is okay.  There were no delusions, paranoia or any obsessive thoughts.  Her thought of knowledge is good.  Her immediate and remote memory is good.  She is alert and oriented x3.  Her insight judgment and impulse control is okay.   Established Problem, Stable/Improving (1), Review of Psycho-Social Stressors (1), Review of Last Therapy Session (1) and Review of Medication Regimen & Side Effects (2)  Assessment: Axis I: Generalized anxiety disorder,  Axis II: Deferred  Axis III:  Past Medical History  Diagnosis Date  . CHF (congestive heart failure)   . GERD (gastroesophageal reflux disease)   . Hypertension   . Acid reflux   . Ulcer   . Heart murmur     Axis IV: Mild to moderate   Plan:  Recommended to continue Zoloft at present dose.  Reassurance given that side effects disappear if she continues to take it for a longer time.  The patient is not interested to continue counseling at this time.  Recommended to call us back if she has any question or any concern.  Followup in 3 months.  A new prescription of Ativan #15 tab given.  Delva Derden T., MD 08/23/2014

## 2014-09-13 ENCOUNTER — Encounter: Payer: Self-pay | Admitting: Family Medicine

## 2014-09-13 ENCOUNTER — Ambulatory Visit (INDEPENDENT_AMBULATORY_CARE_PROVIDER_SITE_OTHER): Payer: BC Managed Care – PPO | Admitting: Family Medicine

## 2014-09-13 ENCOUNTER — Ambulatory Visit (HOSPITAL_COMMUNITY): Payer: Self-pay | Admitting: Psychiatry

## 2014-09-13 VITALS — BP 122/82 | HR 123 | Temp 97.9°F | Ht 62.0 in | Wt 127.1 lb

## 2014-09-13 DIAGNOSIS — J329 Chronic sinusitis, unspecified: Secondary | ICD-10-CM

## 2014-09-13 DIAGNOSIS — I10 Essential (primary) hypertension: Secondary | ICD-10-CM

## 2014-09-13 MED ORDER — LISINOPRIL 40 MG PO TABS: 40.0000 mg | ORAL_TABLET | Freq: Every day | ORAL | Status: DC

## 2014-09-13 MED ORDER — BISOPROLOL-HYDROCHLOROTHIAZIDE 5-6.25 MG PO TABS: 1.0000 | ORAL_TABLET | Freq: Every day | ORAL | Status: DC

## 2014-09-13 MED ORDER — AZITHROMYCIN 250 MG PO TABS: ORAL_TABLET | ORAL | Status: DC

## 2014-09-13 NOTE — Patient Instructions (Signed)
-  As we discussed, we have prescribed a new medication (ZPAK) for you at this appointment. We discussed the common and serious potential adverse effects of this medication and you can review these and more with the pharmacist when you pick up your medication.  Please follow the instructions for use carefully and notify us immediately if you have any problems taking this medication.  -follow up if symptoms worsen or persist  -please see you psychiatrist about your anxiety medications

## 2014-09-13 NOTE — Progress Notes (Signed)
Pre visit review using our clinic review tool, if applicable. No additional management support is needed unless otherwise documented below in the visit note. 

## 2014-09-13 NOTE — Progress Notes (Signed)
No chief complaint on file.   HPI:  -started: about 1-2 weeks ago -symptoms:nasal congestion, sore throat, cough, worening and now with sinus pain and pressure -denies:fever, SOB, NVD, tooth pain -has tried:  -sick contacts/travel/risks: denies flu exposure, tick exposure or or Ebola risks -Hx of: allergies  HTN: -needs refill on BP medication -stable  GAD: -sees psych for this -reports stopped her zoloft after a 2 week trial, felt like it upset her stomach -doing better overall - but she is frustrated that she still has symptoms and that this medication did not work for her  ROS: See pertinent positives and negatives per HPI.  Past Medical History  Diagnosis Date  . CHF (congestive heart failure)   . GERD (gastroesophageal reflux disease)   . Hypertension   . Acid reflux   . Ulcer   . Heart murmur     Past Surgical History  Procedure Laterality Date  . Tonsillectomy and adenoidectomy    . Shoulder repalcement    . Abdominal hysterectomy      complete for fibroids    Family History  Problem Relation Age of Onset  . Prostate cancer Father   . Colon cancer Maternal Grandmother   . Anxiety disorder Mother     History   Social History  . Marital Status: Single    Spouse Name: N/A    Number of Children: N/A  . Years of Education: N/A   Social History Main Topics  . Smoking status: Current Every Day Smoker -- 0.50 packs/day    Types: Cigarettes  . Smokeless tobacco: Never Used     Comment: almost a pack a day;   . Alcohol Use: No  . Drug Use: No  . Sexual Activity: None   Other Topics Concern  . None   Social History Narrative   She does have BS degree from [employer]. 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 She is very  Much afraid of losing her job and is very concerned about this. She has supportive friends and ex- sponsors    Current outpatient prescriptions: bisoprolol-hydrochlorothiazide (ZIAC) 5-6.25 MG per tablet,  Take 1 tablet by mouth daily., Disp: 90 tablet, Rfl: 3;  lisinopril (PRINIVIL,ZESTRIL) 40 MG tablet, Take 1 tablet (40 mg total) by mouth daily., Disp: 90 tablet, Rfl: 3;  LORazepam (ATIVAN) 0.5 MG tablet, Take 1 tablet (0.5 mg total) by mouth as needed for anxiety (use very sparingly)., Disp: 15 tablet, Rfl: 0 omeprazole (PRILOSEC OTC) 20 MG tablet, Take 20 mg by mouth daily., Disp: , Rfl: ;  traMADol (ULTRAM) 50 MG tablet, , Disp: , Rfl: ;  azithromycin (ZITHROMAX) 250 MG tablet, 2 tabs on first day, then 1 tab daily for 4 more days, Disp: 6 tablet, Rfl: 0  EXAM:  Filed Vitals:   09/13/14 1052  BP: 122/82  Pulse: 123  Temp: 97.9 F (36.6 C)    Body mass index is 23.24 kg/(m^2).  GENERAL: vitals reviewed and listed above, alert, oriented, appears well hydrated and in no acute distress  HEENT: atraumatic, conjunttiva clear, no obvious abnormalities on inspection of external nose and ears, normal appearance of ear canals and TMs except for clear effusion bilat, thick nasal congestion, mild post oropharyngeal erythema with PND, no tonsillar edema or exudate, max sinus TTP  NECK: no obvious masses on inspection  LUNGS: clear to auscultation bilaterally, no wheezes, rales or rhonchi, good air movement  CV: HRRR, no peripheral edema  MS: moves all extremities  without noticeable abnormality  PSYCH: pleasant and cooperative, no obvious depression or anxiety  ASSESSMENT AND PLAN:  Discussed the following assessment and plan:  Rhinosinusitis - Plan: azithromycin (ZITHROMAX) 250 MG tablet  Essential hypertension - Plan: bisoprolol-hydrochlorothiazide (ZIAC) 5-6.25 MG per tablet  Anxiety: -advised she notify her  psychiatrist and follow with psychiatry with any medication changes  -of course, we advised to return or notify a doctor immediately if symptoms worsen or persist or new concerns arise.    Patient Instructions  -As we discussed, we have prescribed a new medication (ZPAK)  for you at this appointment. We discussed the common and serious potential adverse effects of this medication and you can review these and more with the pharmacist when you pick up your medication.  Please follow the instructions for use carefully and notify us immediately if you have any problems taking this medication.  -follow up if symptoms worsen or persist  -please see you psychiatrist about your anxiety medications      KIM, HANNAH R.

## 2014-09-15 ENCOUNTER — Telehealth: Payer: Self-pay | Admitting: Family Medicine

## 2014-09-15 NOTE — Telephone Encounter (Signed)
Pt called in and is wanted to switch PCP to Terri Piedra here at [facility].  Is this ok?

## 2014-09-16 NOTE — Telephone Encounter (Signed)
Ok with me 

## 2014-09-22 ENCOUNTER — Other Ambulatory Visit (HOSPITAL_COMMUNITY): Payer: Self-pay | Admitting: *Deleted

## 2014-09-22 DIAGNOSIS — F411 Generalized anxiety disorder: Secondary | ICD-10-CM

## 2014-09-22 NOTE — Telephone Encounter (Signed)
Patient left VM: Having increased anxiety. Works in [employer] research (medical), anxiety increases at holiday season. Only has a few Lorazepam left, can she get it refilled? Also asking about holiday appt schedule, as her next appt is in January.  Phoned pt: Left message: Last RX for Lorazepam written 08/23/14. Will give request to Dr. Adele Schilder and let her know if authorized. Encouraged her to contact office and make appt to see MD sooner if needed

## 2014-09-23 MED ORDER — LORAZEPAM 0.5 MG PO TABS: 0.5000 mg | ORAL_TABLET | ORAL | Status: DC | PRN

## 2014-09-23 NOTE — Telephone Encounter (Signed)
RX called to pharmacy.Does not need to come in for refill

## 2014-09-23 NOTE — Addendum Note (Signed)
Addended by: Rolland Bimler on: 09/23/2014 01:45 PM   Modules accepted: Orders

## 2014-09-23 NOTE — Telephone Encounter (Signed)
MD authorized refill of Lorazepam

## 2014-09-23 NOTE — Telephone Encounter (Signed)
Patient left UN:[id number] called again to request Ativan as she has not heard back from MD. STates she does not need to come in early unless MD needs to see her early to refill Ativan.

## 2014-10-11 ENCOUNTER — Encounter: Payer: Self-pay | Admitting: *Deleted

## 2014-10-11 ENCOUNTER — Telehealth (HOSPITAL_COMMUNITY): Payer: Self-pay | Admitting: *Deleted

## 2014-10-11 ENCOUNTER — Encounter: Payer: Self-pay | Admitting: Family Medicine

## 2014-10-11 ENCOUNTER — Ambulatory Visit (INDEPENDENT_AMBULATORY_CARE_PROVIDER_SITE_OTHER): Payer: BC Managed Care – PPO | Admitting: Family Medicine

## 2014-10-11 VITALS — BP 108/74 | HR 75 | Temp 98.3°F | Ht 62.0 in | Wt 128.8 lb

## 2014-10-11 DIAGNOSIS — R059 Cough, unspecified: Secondary | ICD-10-CM

## 2014-10-11 DIAGNOSIS — R05 Cough: Secondary | ICD-10-CM

## 2014-10-11 MED ORDER — AZITHROMYCIN 250 MG PO TABS: ORAL_TABLET | ORAL | Status: DC

## 2014-10-11 MED ORDER — HYDROCODONE-HOMATROPINE 5-1.5 MG/5ML PO SYRP: 5.0000 mL | ORAL_SOLUTION | Freq: Three times a day (TID) | ORAL | Status: DC | PRN

## 2014-10-11 NOTE — Patient Instructions (Signed)
Please be careful and do not drive or take the cough medication with other sedating medications  -As we discussed, we have prescribed a new medication for you at this appointment. We discussed the common and serious potential adverse effects of this medication and you can review these and more with the pharmacist when you pick up your medication.  Please follow the instructions for use carefully and notify us immediately if you have any problems taking this medication.

## 2014-10-11 NOTE — Telephone Encounter (Signed)
Patient left FB:[id number] increased stress and anxiety.Worse over last 3 months. Unable to take Sertraline due to stomach upset - she had told him it was a problem. Wants to come in to see MD as soon as possible.Please call.  Phoned patient-Left message: Request for appt given to scheduling staff.

## 2014-10-11 NOTE — Progress Notes (Addendum)
HPI:  Acute visit for:  1) Cough: -intermittent issues with sinuses and cough, but recurred 3 days ago -hx severe anxiety and GERD -reports: sinus congestion, sinus pressure, cough, wheezing, chills, subjective fevers -denies: SOB, NVD, fevers, bodyaches  Per notes pt called to request change in PCP to [facility]. Asked if she will be switching and she reports: she is maybe going to switch as wants PCP to manage her psych issues.   ROS: See pertinent positives and negatives per HPI.  Past Medical History  Diagnosis Date  . CHF (congestive heart failure)   . GERD (gastroesophageal reflux disease)   . Hypertension   . Acid reflux   . Ulcer   . Heart murmur     Past Surgical History  Procedure Laterality Date  . Tonsillectomy and adenoidectomy    . Shoulder repalcement    . Abdominal hysterectomy      complete for fibroids    Family History  Problem Relation Age of Onset  . Prostate cancer Father   . Colon cancer Maternal Grandmother   . Anxiety disorder Mother     History   Social History  . Marital Status: Single    Spouse Name: N/A    Number of Children: N/A  . Years of Education: N/A   Social History Main Topics  . Smoking status: Current Every Day Smoker -- 0.50 packs/day    Types: Cigarettes  . Smokeless tobacco: Never Used     Comment: almost a pack a day;   . Alcohol Use: No  . Drug Use: No  . Sexual Activity: None   Other Topics Concern  . None   Social History Narrative   She does have BS degree from [employer]. 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 She is very  Much afraid of losing her job and is very concerned about this. She has supportive friends and ex- sponsors    Current outpatient prescriptions: bisoprolol-hydrochlorothiazide (ZIAC) 5-6.25 MG per tablet, Take 1 tablet by mouth daily., Disp: 90 tablet, Rfl: 3;  lisinopril (PRINIVIL,ZESTRIL) 40 MG tablet, Take 1 tablet (40 mg total) by mouth daily., Disp: 90  tablet, Rfl: 3;  LORazepam (ATIVAN) 0.5 MG tablet, Take 1 tablet (0.5 mg total) by mouth as needed for anxiety (use very sparingly)., Disp: 15 tablet, Rfl: 0 omeprazole (PRILOSEC OTC) 20 MG tablet, Take 20 mg by mouth daily., Disp: , Rfl: ;  traMADol (ULTRAM) 50 MG tablet, , Disp: , Rfl: ;  azithromycin (ZITHROMAX) 250 MG tablet, 2 tabs on the first day and 1 tab daily for the next 4 days, Disp: 6 tablet, Rfl: 0;  HYDROcodone-homatropine (HYCODAN) 5-1.5 MG/5ML syrup, Take 5 mLs by mouth every 8 (eight) hours as needed for cough., Disp: 120 mL, Rfl: 0  EXAM:  Filed Vitals:   10/11/14 1551  BP: 108/74  Pulse: 75  Temp: 98.3 F (36.8 C)    Body mass index is 23.55 kg/(m^2).  GENERAL: vitals reviewed and listed above, alert, oriented, appears well hydrated and in no acute distress  HEENT: atraumatic, conjunttiva clear, no obvious abnormalities on inspection of external nose and ears  NECK: no obvious masses on inspection  LUNGS: ? Rhonchi L base, o/w normal  CV: HRRR, no peripheral edema  MS: moves all extremities without noticeable abnormality  PSYCH: pleasant and cooperative, no obvious depression or anxiety  ASSESSMENT AND PLAN:  Discussed the following assessment and plan:  Cough - Plan: azithromycin (ZITHROMAX) 250 MG tablet,  HYDROcodone-homatropine (HYCODAN) 5-1.5 MG/5ML syrup  -likely viral, but opted for abx with findings on exam, discussed risks meds -Patient advised to return or notify a doctor immediately if symptoms worsen or persist or new concerns arise.  In regard to her desire to change PCPs for her psych care: She has a PMH bipolar disorder and severe anxiety causing loss of function at work despite our attempt to treat with several medications and counseling. She also has had episodes of rude behavior to staff here during uncontrolled flares in symptoms and was reluctant to follow recommendations we provided in regards to treating her. I had advised previously I  will no longer manage her psychiatric medications as I feel the severity of her disease requires care with a psychiatrist. She does not like her psychiatrist and is looking for a PCP whom will manage her psychiatric care. I advised she continue with her psychiatrist and ensure he is aware of her concerns or find another psychiatrist for her mental health care.  Patient Instructions  Please be careful and do not drive or take the cough medication with other sedating medications  -As we discussed, we have prescribed a new medication for you at this appointment. We discussed the common and serious potential adverse effects of this medication and you can review these and more with the pharmacist when you pick up your medication.  Please follow the instructions for use carefully and notify us immediately if you have any problems taking this medication.      Colin Benton R.

## 2014-10-11 NOTE — Progress Notes (Signed)
Pre visit review using our clinic review tool, if applicable. No additional management support is needed unless otherwise documented below in the visit note. 

## 2014-10-12 NOTE — Telephone Encounter (Signed)
Call r/t and left message to call us back.

## 2014-10-13 ENCOUNTER — Encounter (HOSPITAL_COMMUNITY): Payer: Self-pay | Admitting: Psychiatry

## 2014-10-13 ENCOUNTER — Ambulatory Visit (INDEPENDENT_AMBULATORY_CARE_PROVIDER_SITE_OTHER): Payer: BC Managed Care – PPO | Admitting: Psychiatry

## 2014-10-13 VITALS — BP 174/95 | HR 83 | Ht 62.0 in | Wt 129.8 lb

## 2014-10-13 DIAGNOSIS — F411 Generalized anxiety disorder: Secondary | ICD-10-CM

## 2014-10-13 MED ORDER — LAMOTRIGINE 25 MG PO TABS: ORAL_TABLET | ORAL | Status: DC

## 2014-10-13 MED ORDER — ESCITALOPRAM OXALATE 5 MG PO TABS: 5.0000 mg | ORAL_TABLET | Freq: Every day | ORAL | Status: DC

## 2014-10-13 MED ORDER — LORAZEPAM 0.5 MG PO TABS: 0.5000 mg | ORAL_TABLET | ORAL | Status: DC | PRN

## 2014-10-13 NOTE — Progress Notes (Signed)
[facility] [phone] Progress Note   [name] [id number] 63 y.o.  10/13/2014 3:51 PM  Chief Complaint:  I am under a lot of stress.  I was accused for stealing at work.         History of Present Illness:  [name] came earlier than her scheduled appointment.  She's been calling for past few days complaining of increased anxiety and nervousness.  She is requesting Ativan because she is under a lot of stress.  Today she mentioned that she has been accused for stealing at work and she was very emotional and tearful.  She mentioned that she has never done stealing and she feels very embarrassed when she was accused at work.  So far she do not remember any charges were filed but she has to go to work tomorrow to explain everything.  Earlier she has been calling us because she is feeling stressed.  She stopped taking Zoloft because of stomach issues.  Today she bring list of medication that she can try to calm her anxiety and nervousness.  She admitted irritability, racing thoughts, anxiety and crying spells.  I review her notes from her primary care physician and it appears that she wanted to get her psychiatric medication management by her primary care physician but it was refused.  As per chart she mentioned to her primary care physician that she does not like her psychiatrist however today patient denied saying that.  She admitted that she wants to get psychiatric medication management by primary care physician because of the cost.  She is open to try a new medication which can help her depression and anxiety symptoms.  She has GI issues with SSRI.  Recently she was given antibiotic for sinus infection.  Patient denies any hallucination, paranoia, violence or any active or passive suicidal thoughts.  She continues to have nervousness and she claimed that she has panic attack.  She is taking Ativan as needed.  She is working at [employer] and she is hoping to get her tired next year.  Patient  denies drinking or using any illegal substances.  Suicidal Ideation: No Plan Formed: No Patient has means to carry out plan: No  Homicidal Ideation: No Plan Formed: No Patient has means to carry out plan: No  Past Psychiatric History/Hospitalization(s) Patient has history of anxiety disorder since age 76.  She has taken BuSpar in the past with limited response.  We had tried Zoloft the patient do not tolerate more than 25 mg because of GI side effects.   Anxiety: Yes Bipolar Disorder: No Depression: No Mania: No Psychosis: No Schizophrenia: No Personality Disorder: No Hospitalization for psychiatric illness: No History of Electroconvulsive Shock Therapy: No Prior Suicide Attempts: No  Medical History; Patient has GERD, hypertension, acid reflux, also in heart murmur.  She has history of tonsillectomy and shoulder replacement.  Her primary care physician is Dr. Colin Benton.   Review of Systems  Constitutional:       Tired  Psychiatric/Behavioral: Positive for depression. The patient is nervous/anxious and has insomnia.      Psychiatric: Agitation: No Hallucination: No Depressed Mood: Yes Insomnia: Yes Hypersomnia: No Altered Concentration: No Feels Worthless: No Grandiose Ideas: No Belief In Special Powers: No New/Increased Substance Abuse: No Compulsions: No  Neurologic: Headache: No Seizure: No Paresthesias: No    Outpatient Encounter Prescriptions as of 10/13/2014  Medication Sig  . azithromycin (ZITHROMAX) 250 MG tablet 2 tabs on the first day and 1 tab daily for  the next 4 days  . bisoprolol-hydrochlorothiazide (ZIAC) 5-6.25 MG per tablet Take 1 tablet by mouth daily.  Marland Kitchen escitalopram (LEXAPRO) 5 MG tablet Take 1 tablet (5 mg total) by mouth daily.  Marland Kitchen HYDROcodone-homatropine (HYCODAN) 5-1.5 MG/5ML syrup Take 5 mLs by mouth every 8 (eight) hours as needed for cough.  . lamoTRIgine (LAMICTAL) 25 MG tablet Take 1 tab daily for 1 week and than 2 tab daily  .  lisinopril (PRINIVIL,ZESTRIL) 40 MG tablet Take 1 tablet (40 mg total) by mouth daily.  Marland Kitchen LORazepam (ATIVAN) 0.5 MG tablet Take 1 tablet (0.5 mg total) by mouth as needed for anxiety (use very sparingly).  Marland Kitchen omeprazole (PRILOSEC OTC) 20 MG tablet Take 20 mg by mouth daily.  . traMADol (ULTRAM) 50 MG tablet   . [DISCONTINUED] LORazepam (ATIVAN) 0.5 MG tablet Take 1 tablet (0.5 mg total) by mouth as needed for anxiety (use very sparingly).    No results found for this or any previous visit (from the past 2160 hour(s)).    Physical Exam: Constitutional:  BP 174/95 mmHg  Pulse 83  Ht 5\' 2"  (1.575 m)  Wt 129 lb 12.8 oz (58.877 kg)  BMI 23.73 kg/m2  Musculoskeletal: Strength & Muscle Tone: within normal limits Gait & Station: normal Patient leans: N/A  Mental Status Examination;  Patient is casually dressed and well groomed.  She is tearful but cooperative.  She described her mood depressed, anxious and sad .  Her affect is constricted.  She appears very emotional today.  Her speech is fast but clear and coherent.  Her attention and concentration is fair.  Her thought processes logical and goal-directed.  She denies any auditory or visual hallucination.  She denies any active or passive suicidal thoughts or homicidal thoughts.  Her psychomotor activity is slightly increased.  Her attention and concentration is okay.  There were no delusions, paranoia or any obsessive thoughts.  Her fund of knowledge is average.  Her immediate and remote memory is good.  She is alert and oriented x3.  Her insight judgment and impulse control is okay.   Established Problem, Stable/Improving (1), New problem, with additional work up planned, Review of Psycho-Social Stressors (1), Review or order clinical lab tests (1), Review and summation of old records (2), Established Problem, Worsening (2), New Problem, with no additional work-up planned (3), Review of Last Therapy Session (1), Review of Medication Regimen &  Side Effects (2) and Review of New Medication or Change in Dosage (2)  Assessment: Axis I: Generalized anxiety disorder,  Axis II: Deferred  Axis III:  Past Medical History  Diagnosis Date  . CHF (congestive heart failure)   . GERD (gastroesophageal reflux disease)   . Hypertension   . Acid reflux   . Ulcer   . Heart murmur     Axis IV: Mild to moderate   Plan:  I had a long discussion with the patient about her medication.  She has GI side effects due to SSRIs.  However she continued to insist to try Lexapro which her cousin took it with good response.  I do believe patient requires mood stabilizer along with antidepressant.  I will try Lamictal low dose 25 mg daily for 1 week and gradually increase to 50 mg a day.  I will also start low-dose Lexapro however explained that it can cause GI side effects.  Patient wants to try anyway.  I would also provide a new prescription of Ativan 0.5 mg for severe anxiety and panic  attack.  I strongly encouraged her to see a therapist for counseling and social skills.  Recommended to call us back if she has any question, concern or if she feels worsening of the symptoms.  I will see her again in 4-5 weeks. Time spent 25 minutes.  More than 50% of the time spent in psychoeducation, counseling and coordination of care.  Discuss safety plan that anytime having active suicidal thoughts or homicidal thoughts then patient need to call 911 or go to the local emergency room.  [name] T., MD 10/13/2014

## 2014-11-02 ENCOUNTER — Encounter: Payer: Self-pay | Admitting: Internal Medicine

## 2014-11-02 ENCOUNTER — Telehealth (HOSPITAL_COMMUNITY): Payer: Self-pay

## 2014-11-02 ENCOUNTER — Ambulatory Visit (INDEPENDENT_AMBULATORY_CARE_PROVIDER_SITE_OTHER): Payer: BC Managed Care – PPO | Admitting: Internal Medicine

## 2014-11-02 ENCOUNTER — Telehealth: Payer: Self-pay | Admitting: Internal Medicine

## 2014-11-02 VITALS — BP 124/72 | HR 72 | Ht 62.0 in | Wt 124.2 lb

## 2014-11-02 DIAGNOSIS — K219 Gastro-esophageal reflux disease without esophagitis: Secondary | ICD-10-CM

## 2014-11-02 DIAGNOSIS — Z1211 Encounter for screening for malignant neoplasm of colon: Secondary | ICD-10-CM

## 2014-11-02 DIAGNOSIS — Z566 Other physical and mental strain related to work: Secondary | ICD-10-CM

## 2014-11-02 DIAGNOSIS — R1013 Epigastric pain: Secondary | ICD-10-CM

## 2014-11-02 MED ORDER — PEG-KCL-NACL-NASULF-NA ASC-C 100 G PO SOLR: 1.0000 | Freq: Once | ORAL | Status: DC

## 2014-11-02 MED ORDER — DEXLANSOPRAZOLE 60 MG PO CPDR: 60.0000 mg | DELAYED_RELEASE_CAPSULE | Freq: Every day | ORAL | Status: DC

## 2014-11-02 MED ORDER — SUCRALFATE 1 GM/10ML PO SUSP: 1.0000 g | Freq: Three times a day (TID) | ORAL | Status: DC

## 2014-11-02 MED ORDER — LORAZEPAM 0.5 MG PO TABS: 0.5000 mg | ORAL_TABLET | Freq: Three times a day (TID) | ORAL | Status: DC

## 2014-11-02 NOTE — Progress Notes (Signed)
Subjective:    Patient ID: [name], female    DOB: 12/26/1950, 63 y.o.   MRN: [id number]  HPI [name] is a 63 year old female with past medical history of GERD with hiatal hernia, remote PUD, hypertension who is seen in follow-up. She has not been seen since the time of her upper endoscopy in January 2014. That endoscopy showed a 8 cm hiatal hernia, nonobstructing Schatzki's ring with normal gastric and duodenal mucosa. At that time her symptoms were well controlled on Dexilant 60 mg daily.  She reports that over the last few weeks she has developed recurrent epigastric abdominal pain. This is worse with eating. It is a burning and at times sharp pain which does not radiate. She's had some associated nausea but no vomiting. Appetite is also been down. She is also had more heartburn and also regurgitation and belching. She is taking Prevacid 30 mg twice daily without benefit. Bowel movements have been regular without blood in her stool or melena. She can at times have constipation. She reports that she has been under "astronomical and off the chart stress". When discussing this she is tearful. 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 She reports that she has been accused of stealing something at work and that she has had to consult with a Chief Executive Officer. She is using Ativan on an as-needed basis but more lately. She reports her diet overall is good and she avoids fried and fatty foods. On a separate note she does request repeat colonoscopy due to family history of colon cancer in her grandmother. Her last colonoscopy was 8 years ago   Review of Systems As per history of present illness, otherwise negative  Current Medications, Allergies, Past Medical History, Past Surgical History, Family History and Social History were reviewed in [facility] record.     Objective:   Physical Exam BP 124/72 mmHg  Pulse 72  Ht 5\' 2"   (1.575 m)  Wt 124 lb 4 oz (56.359 kg)  BMI 22.72 kg/m2 Constitutional: Well-developed and well-nourished. No distress. HEENT: Normocephalic and atraumatic. Oropharynx is clear and moist. No oropharyngeal exudate. Conjunctivae are normal.  No scleral icterus. Neck: Neck supple. Trachea midline. Cardiovascular: Normal rate, regular rhythm and intact distal pulses. 2/6 SEM  Pulmonary/chest: Effort normal and breath sounds normal. No wheezing, rales or rhonchi. Abdominal: Soft, epigastric tenderness without rebound or guarding, nondistended. Bowel sounds active throughout.  Extremities: no clubbing, cyanosis, or edema Neurological: Alert and oriented to person place and time. Skin: Skin is warm and dry. No rashes noted. Psychiatric: Normal mood and affect. Behavior is normal.  EGD from Jan 2014 reviewed    Assessment & Plan:   63 year old female with past medical history of GERD with hiatal hernia, remote PUD, hypertension who is seen in follow-up.   1. GERD/HH/epigastric pain -- her pain could be related to gastroduodenitis or even ulcer disease. I think this is certainly exacerbated by recent stressors in her life. I will change her Prevacid to Dexilant 60 mg daily which she previously tolerated very well. Add Carafate 1 g before meals and at bedtime. Ultrasound of the abdomen. CBC, CMP and lipase. Work note through 11/07/2014   2.  Stress -- Ativan 0.5 mg every 8 hours when necessary #30, future prescriptions need to come from behavioral health. She has follow-up with behavioral health scheduled.   2. CRC screening -- colonoscopy to be scheduled. Procedure discussed  including the risks and benefits and she is agreeable to proceed

## 2014-11-02 NOTE — Telephone Encounter (Signed)
Prior [name] was initiated today.

## 2014-11-02 NOTE — Telephone Encounter (Signed)
Left message for patient to call back  

## 2014-11-02 NOTE — Patient Instructions (Signed)
You have been scheduled for an abdominal ultrasound at [facility] (1st floor of hospital) on  11/03/2014 at 8:30am. Please arrive 15 minutes prior to your appointment for registration. Make certain not to have anything to eat or drink 6 hours prior to your appointment. Should you need to reschedule your appointment, please contact radiology at [phone]. This test typically takes about 30 minutes to perform.  Go to the basement for labs   We are sending in your prescriptions to your pharmacy today   You have been scheduled for a colonoscopy. Please follow written instructions given to you at your visit today.  Please pick up your prep kit at the pharmacy within the next 1-3 days. If you use inhalers (even only as needed), please bring them with you on the day of your procedure. Your physician has requested that you go to www.startemmi.com and enter the access code given to you at your visit today. This web site gives a general overview about your procedure. However, you should still follow specific instructions given to you by our office regarding your preparation for the procedure.

## 2014-11-03 ENCOUNTER — Ambulatory Visit (HOSPITAL_COMMUNITY)
Admission: RE | Admit: 2014-11-03 | Discharge: 2014-11-03 | Disposition: A | Discharge status: Home / Self Care | Payer: BC Managed Care – PPO | Source: Ambulatory Visit | Attending: Internal Medicine | Admitting: Internal Medicine

## 2014-11-03 ENCOUNTER — Telehealth: Payer: Self-pay | Admitting: *Deleted

## 2014-11-03 ENCOUNTER — Other Ambulatory Visit (INDEPENDENT_AMBULATORY_CARE_PROVIDER_SITE_OTHER): Payer: BC Managed Care – PPO

## 2014-11-03 DIAGNOSIS — R1013 Epigastric pain: Secondary | ICD-10-CM

## 2014-11-03 LAB — COMPREHENSIVE METABOLIC PANEL
ALBUMIN: 4.4 g/dL (ref 3.5–5.2)
ALK PHOS: 92 U/L (ref 39–117)
ALT: 14 U/L (ref 0–35)
AST: 20 U/L (ref 0–37)
BILIRUBIN TOTAL: 0.5 mg/dL (ref 0.2–1.2)
BUN: 10 mg/dL (ref 6–23)
CO2: 31 mEq/L (ref 19–32)
Calcium: 9.8 mg/dL (ref 8.4–10.5)
Chloride: 95 mEq/L — ABNORMAL LOW (ref 96–112)
Creatinine, Ser: 0.9 mg/dL (ref 0.4–1.2)
GFR: 70.3 mL/min (ref >60.00)
GLUCOSE: 110 mg/dL — AB (ref 70–99)
Potassium: 4.5 mEq/L (ref 3.5–5.1)
Sodium: 133 mEq/L — ABNORMAL LOW (ref 135–145)
Total Protein: 7.3 g/dL (ref 6.0–8.3)

## 2014-11-03 LAB — CBC WITH DIFFERENTIAL/PLATELET
BASOS PCT: 2.3 % (ref 0.0–3.0)
Basophils Absolute: 0.3 10*3/uL — ABNORMAL HIGH (ref 0.0–0.1)
EOS PCT: 1.2 % (ref 0.0–5.0)
Eosinophils Absolute: 0.1 10*3/uL (ref 0.0–0.7)
HEMATOCRIT: 45.4 % (ref 36.0–46.0)
Hemoglobin: 14.8 g/dL (ref 12.0–15.0)
LYMPHS ABS: 3.5 10*3/uL (ref 0.7–4.0)
Lymphocytes Relative: 31 % (ref 12.0–46.0)
MCHC: 32.7 g/dL (ref 30.0–36.0)
MCV: 90 fl (ref 78.0–100.0)
MONO ABS: 0.5 10*3/uL (ref 0.1–1.0)
Monocytes Relative: 4.5 % (ref 3.0–12.0)
NEUTROS ABS: 6.9 10*3/uL (ref 1.4–7.7)
Neutrophils Relative %: 61 % (ref 43.0–77.0)
Platelets: 416 10*3/uL — ABNORMAL HIGH (ref 150.0–400.0)
RBC: 5.05 Mil/uL (ref 3.87–5.11)
RDW: 14.6 % (ref 11.5–15.5)
WBC: 11.3 10*3/uL — ABNORMAL HIGH (ref 4.0–10.5)

## 2014-11-03 LAB — LIPASE: Lipase: 44 U/L (ref 11.0–59.0)

## 2014-11-03 NOTE — Telephone Encounter (Signed)
ok 

## 2014-11-03 NOTE — Telephone Encounter (Signed)
Dr Freddy Jaksch to add EGD?

## 2014-11-03 NOTE — Telephone Encounter (Signed)
Patient called back, returning [name]'s call. I advised patient that Carla Drape started the prior authorization for her Dexilant. I advised patient we have to wait on her insurance company to approve the medication, it will take 24-48 hrs and due to the holiday may not hear back until middle of next week. Patient verbalized understanding. Patient stated she was told that if she was not better she could have another EGD done, notes below are Dr. Garth Schlatter recommendation. Patient stated that she was not better and wanted another Upper Endoscopy. I advised patient she is scheduled for a Colonoscopy on 02-16 and I can add another slot for Upper Endoscopy but we can cancel the Upper Endoscopy if Dexilant starts to help her symptoms. Patient verbalized understanding and once she has her Dexilant she will try it for two weeks to see if it helps and if it does then she will call to cancel Upper Endoscopy. Result Notes     Notes Recorded by Jerene Bears, MD on 11/03/2014 at 10:07 AM Ultrasound shows normal gallbladder 2 small lesions in the liver felt to be hemangiomas which are benign collection of blood vessels No explanation for pain Await improvement with changes made yesterday in clinic, if no better will likely need repeat EGD versus cross-sectional imaging

## 2014-11-04 ENCOUNTER — Telehealth: Payer: Self-pay | Admitting: Internal Medicine

## 2014-11-04 ENCOUNTER — Telehealth (HOSPITAL_COMMUNITY): Payer: Self-pay | Admitting: *Deleted

## 2014-11-04 NOTE — Telephone Encounter (Signed)
Pt called stating she needs to see her Doctor sometime next week to discuss some options. "Next appointment Jan 12th and can't wait that long, it's important."  Called patient and transferred her to front desk for appointment.

## 2014-11-04 NOTE — Telephone Encounter (Signed)
Left message for patient that prior auth was initiated on 11/02/14. We are still waiting for insurance approval/denial.

## 2014-11-04 NOTE — Telephone Encounter (Signed)
[employer] has approved patient's Dexilant from 10/12/14-11/03/15. Case NO:[id number]. Left message advising patient.

## 2014-11-08 ENCOUNTER — Telehealth: Payer: Self-pay | Admitting: Internal Medicine

## 2014-11-08 NOTE — Telephone Encounter (Signed)
Patient crying saying she cant eat and needs more advice on what to do.

## 2014-11-08 NOTE — Telephone Encounter (Signed)
Spoke with patient and she states she is still having problems with nausea and reflux. States she cannot eat. Asked patient what she had tried and she states she is eating bananas, chicken noodle soup and crackers. She states it may be her nerves due to stress at work. States Ativan is helping some. She has not gotten her Dexilant yet. Gave her the PA case number and she will call [facility] about her medication. She is asking for a note to be out of work. Explained that her symptoms would not warrant being out of work.

## 2014-11-08 NOTE — Telephone Encounter (Signed)
I got another note from Levan indicating that patient's Dexilant needs a prior authorization. I called [employer] since I already got a prior authorization and got the PA #. I was told by the representative, that actually, the medication does not require a prior authorization, it is just not covered at all. I explained that I got a PA approval. She states that it was because prior auth was done electronically. I asked her to tell me if omeprazole, pantoprazole, lansoprazole, esomeprazole or Nexium was covered and I was told that none of these are covered under patient's insurance plan nor will they be covered in 2016. Dr Hilarie Fredrickson- is there a certain PPI you would like me to suggest she take over the counter since we cannot seem to get any approved by insurance?

## 2014-11-11 ENCOUNTER — Telehealth: Payer: Self-pay | Admitting: Internal Medicine

## 2014-11-11 NOTE — Telephone Encounter (Signed)
Pt called back stating she would like a answer on what she can take before the weekend since she cannot eat very much. Explained to patient what Carla Drape found out with her insurance company. Patient also wanted to know if we can move up her procedure and I checked Dr. Vena Rua schedule and told her unfortunately her has nothing sooner. Patient states she is very stressed at the moment knows that has something to do with her symptoms. I asked patient if she is taking the Carafate as prescribed. Patient states she is taking that along with omeprazole 20 mg (3 tablets) this morning. Told patient I would ask Dr. Fuller Plan (doc of the day) if there is something that she can take to help her symptoms since Dr. Hilarie Fredrickson is not in the office today. Per Dr. Fuller Plan patient can take over the counter Nexium 40 mg tablet twice a day or 2 20 mg tablets twice a day along with her Carafate. Told patient to also keep her appt for [facility] and if this is not helping her symptoms to call back before procedure. Pt agreed and verbalized understanding.

## 2014-11-11 NOTE — Telephone Encounter (Signed)
See prior phone note on 11/08/14.

## 2014-11-15 NOTE — Telephone Encounter (Signed)
It appears I have availability to perform procedures on 11/17/2013 in the afternoon. Can move her appt to this afternoon if she wishes

## 2014-11-16 NOTE — Telephone Encounter (Signed)
I offered [name] an endo/colon date for tomorrow, 11/17/14 @ 3:30 pm in order to evaluate her symptoms earlier than her scheduled 12/2014 appointment. [name] states that she cannot come for this appointment as it is "too early." I did advise that we would be unable to provide any sooner dates (prior to 12/2014) for an appointment and advised that we are unable to do anything additional for symptom control without a full evaluation. She verbalizes understanding.

## 2014-11-16 NOTE — Telephone Encounter (Signed)
Left voicemail for patient to call back. 

## 2014-11-18 ENCOUNTER — Ambulatory Visit (INDEPENDENT_AMBULATORY_CARE_PROVIDER_SITE_OTHER): Payer: BLUE CROSS/BLUE SHIELD | Admitting: Psychiatry

## 2014-11-18 ENCOUNTER — Encounter (HOSPITAL_COMMUNITY): Payer: Self-pay | Admitting: Psychiatry

## 2014-11-18 VITALS — BP 145/84 | HR 69 | Ht 62.0 in | Wt 124.6 lb

## 2014-11-18 DIAGNOSIS — F411 Generalized anxiety disorder: Secondary | ICD-10-CM

## 2014-11-18 MED ORDER — LAMOTRIGINE 100 MG PO TABS: 100.0000 mg | ORAL_TABLET | Freq: Every day | ORAL | Status: DC

## 2014-11-18 MED ORDER — LORAZEPAM 0.5 MG PO TABS: 0.5000 mg | ORAL_TABLET | ORAL | Status: DC | PRN

## 2014-11-18 NOTE — Progress Notes (Signed)
[facility] [id number] Progress Note   [name] [id number] 64 y.o.  11/18/2014 10:12 AM  Chief Complaint:  I like Lamictal.  I stopped taking Lexapro because it is causing GI side effects.  I still under a lot of stress from work .         History of Present Illness:  [name] came for her follow-up appointment.  On her last visit we started her on Lamictal and on her request Lexapro was also started.  She complaining of GI side effects from Lexapro but she likes the Lamictal.  She mentioned her mood has been more stable and she denies any irritability anger or any mood swings.  She continues to have a lot of stress from work .  She has written a letter to the vice president of the company stating that she is innocent .  Patient told that people from HR are coming today for the interview and they will look into this accusation.  Patient was accused of stealing at work.  Patient also saw a gastroenterologist and is scheduled to have an endoscopy.  She has blood work .  She may require surgery for [name] hernia.  She is thinking due to tired from the work because she admitted job has been very stressful.  She has a retirement date on October 1 which is her birthday.  Patient sleeping on and off .  She likes the Lamictal and denies any rash or itching.  She is taking Ativan 0.5 mg as needed.  She still have 5 tablet remaining.  Patient denies any paranoia or any hallucination.  Patient denies any suicidal thoughts but admitted crying spells and very emotional.  Her appetite is okay.  Her vitals are stable. Patient denies drinking or using any illegal substances.  Suicidal Ideation: No Plan Formed: No Patient has means to carry out plan: No  Homicidal Ideation: No Plan Formed: No Patient has means to carry out plan: No  Past Psychiatric History/Hospitalization(s) Patient has history of anxiety disorder since age 48.  She has taken BuSpar in the past with limited response.  We had tried  Zoloft the patient do not tolerate more than 25 mg because of GI side effects.   Anxiety: Yes Bipolar Disorder: No Depression: No Mania: No Psychosis: No Schizophrenia: No Personality Disorder: No Hospitalization for psychiatric illness: No History of Electroconvulsive Shock Therapy: No Prior Suicide Attempts: No  Medical History; Patient has GERD, hypertension, acid reflux, also in heart murmur.  She has history of tonsillectomy and shoulder replacement.  Her primary care physician is Dr. Colin Benton.   Review of Systems  Constitutional:       Tired  Skin: Negative for itching and rash.  Psychiatric/Behavioral: The patient is nervous/anxious and has insomnia.      Psychiatric: Agitation: No Hallucination: No Depressed Mood: Yes Insomnia: Yes Hypersomnia: No Altered Concentration: No Feels Worthless: No Grandiose Ideas: No Belief In Special Powers: No New/Increased Substance Abuse: No Compulsions: No  Neurologic: Headache: No Seizure: No Paresthesias: No    Outpatient Encounter Prescriptions as of 11/18/2014  Medication Sig  . bisoprolol-hydrochlorothiazide (ZIAC) 5-6.25 MG per tablet Take 1 tablet by mouth daily.  Marland Kitchen dexlansoprazole (DEXILANT) 60 MG capsule Take 1 capsule (60 mg total) by mouth daily.  Marland Kitchen HYDROcodone-homatropine (HYCODAN) 5-1.5 MG/5ML syrup Take 5 mLs by mouth every 8 (eight) hours as needed for cough.  . lamoTRIgine (LAMICTAL) 100 MG tablet Take 1 tablet (100 mg total) by mouth daily.  Marland Kitchen  lansoprazole (PREVACID) 15 MG capsule Take 15 mg by mouth 2 (two) times daily before a meal.  . lisinopril (PRINIVIL,ZESTRIL) 40 MG tablet Take 1 tablet (40 mg total) by mouth daily.  Marland Kitchen LORazepam (ATIVAN) 0.5 MG tablet Take 1 tablet (0.5 mg total) by mouth as needed for anxiety (use very sparingly).  Marland Kitchen omeprazole (PRILOSEC OTC) 20 MG tablet Take 20 mg by mouth daily.  . peg 3350 powder (MOVIPREP) 100 G SOLR Take 1 kit (200 g total) by mouth once.  . sucralfate  (CARAFATE) 1 GM/10ML suspension Take 10 mLs (1 g total) by mouth 4 (four) times daily -  with meals and at bedtime.  . traMADol (ULTRAM) 50 MG tablet   . [DISCONTINUED] lamoTRIgine (LAMICTAL) 25 MG tablet Take 1 tab daily for 1 week and than 2 tab daily  . [DISCONTINUED] LORazepam (ATIVAN) 0.5 MG tablet Take 1 tablet (0.5 mg total) by mouth as needed for anxiety (use very sparingly).  . [DISCONTINUED] LORazepam (ATIVAN) 0.5 MG tablet Take 1 tablet (0.5 mg total) by mouth every 8 (eight) hours.    Recent Results (from the past 2160 hour(s))  CBC with Differential     Status: Abnormal   Collection Time: 11/03/14  9:02 AM  Result Value Ref Range   WBC 11.3 (H) 4.0 - 10.5 K/uL   RBC 5.05 3.87 - 5.11 Mil/uL   Hemoglobin 14.8 12.0 - 15.0 g/dL   HCT 45.4 36.0 - 46.0 %   MCV 90.0 78.0 - 100.0 fl   MCHC 32.7 30.0 - 36.0 g/dL   RDW 14.6 11.5 - 15.5 %   Platelets 416.0 (H) 150.0 - 400.0 K/uL   Neutrophils Relative % 61.0 43.0 - 77.0 %   Lymphocytes Relative 31.0 12.0 - 46.0 %   Monocytes Relative 4.5 3.0 - 12.0 %   Eosinophils Relative 1.2 0.0 - 5.0 %   Basophils Relative 2.3 0.0 - 3.0 %   Neutro Abs 6.9 1.4 - 7.7 K/uL   Lymphs Abs 3.5 0.7 - 4.0 K/uL   Monocytes Absolute 0.5 0.1 - 1.0 K/uL   Eosinophils Absolute 0.1 0.0 - 0.7 K/uL   Basophils Absolute 0.3 (H) 0.0 - 0.1 K/uL  Comp Met (CMET)     Status: Abnormal   Collection Time: 11/03/14  9:02 AM  Result Value Ref Range   Sodium 133 (L) 135 - 145 mEq/L   Potassium 4.5 3.5 - 5.1 mEq/L   Chloride 95 (L) 96 - 112 mEq/L   CO2 31 19 - 32 mEq/L   Glucose, Bld 110 (H) 70 - 99 mg/dL   BUN 10 6 - 23 mg/dL   Creatinine, Ser 0.9 0.4 - 1.2 mg/dL   Total Bilirubin 0.5 0.2 - 1.2 mg/dL   Alkaline Phosphatase 92 39 - 117 U/L   AST 20 0 - 37 U/L   ALT 14 0 - 35 U/L   Total Protein 7.3 6.0 - 8.3 g/dL   Albumin 4.4 3.5 - 5.2 g/dL   Calcium 9.8 8.4 - 10.5 mg/dL   GFR 70.30 >60.00 mL/min  Lipase     Status: None   Collection Time: 11/03/14  9:02 AM   Result Value Ref Range   Lipase 44.0 11.0 - 59.0 U/L      Physical Exam: Constitutional:  BP 145/84 mmHg  Pulse 69  Ht 5' 2" (1.575 m)  Wt 124 lb 9.6 oz (56.518 kg)  BMI 22.78 kg/m2  Musculoskeletal: Strength & Muscle Tone: within normal limits Gait & Station:  normal Patient leans: N/A  Mental Status Examination;  Patient is casually dressed and well groomed.  She is emotional , tearful but cooperative.  She described her mood depressed, anxious and sad .  Her affect is constricted.  Her speech is fast but clear and coherent.  Her attention and concentration is fair.  Her thought processes logical and goal-directed.  She denies any auditory or visual hallucination.  She denies any active or passive suicidal thoughts or homicidal thoughts.  Her psychomotor activity is slightly increased.  Her attention and concentration is okay.  There were no delusions, paranoia or any obsessive thoughts.  Her fund of knowledge is average.  Her immediate and remote memory is good.  She is alert and oriented x3.  Her insight judgment and impulse control is okay.   Established Problem, Stable/Improving (1), Review of Psycho-Social Stressors (1), Review or order clinical lab tests (1), Review of Last Therapy Session (1), Review of Medication Regimen & Side Effects (2) and Review of New Medication or Change in Dosage (2)  Assessment: Axis I: Generalized anxiety disorder, rule out bipolar disorder   Axis II: Deferred  Axis III:  Past Medical History  Diagnosis Date  . CHF (congestive heart failure)   . GERD (gastroesophageal reflux disease)   . Hypertension   . 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 Ulcer   . Heart murmur     Axis IV: Mild to moderate   Plan:  I review her symptoms, history, psychosocial stressors and recent blood work.  Discontinue Lexapro because of GI side effects.  Recommended to increase Lamictal 100 mg daily .  Patient does not have any rash or itching.   Continue Ativan 0.5 mg for severe anxiety and panic attack .  I strongly encouraged to see a therapist for counseling .  Patient picked up the contact information for counseling center at [facility] .  I recommended to call us back if she has any question, concern or if she feels worsening of the symptoms.  I will see her again in 2 months. Time spent 25 minutes.  More than 50% of the time spent in psychoeducation, counseling and coordination of care.  Discuss safety plan that anytime having active suicidal thoughts or homicidal thoughts then patient need to call 911 or go to the local emergency room.  , T., MD 11/18/2014

## 2014-11-23 ENCOUNTER — Ambulatory Visit (HOSPITAL_COMMUNITY): Payer: Self-pay | Admitting: Psychiatry

## 2014-11-24 ENCOUNTER — Encounter: Payer: Self-pay | Admitting: Internal Medicine

## 2014-12-28 ENCOUNTER — Encounter: Payer: BLUE CROSS/BLUE SHIELD | Admitting: Internal Medicine

## 2015-01-14 ENCOUNTER — Other Ambulatory Visit (HOSPITAL_COMMUNITY): Payer: Self-pay

## 2015-01-14 DIAGNOSIS — F411 Generalized anxiety disorder: Secondary | ICD-10-CM

## 2015-01-14 NOTE — Telephone Encounter (Signed)
Okay to get early refill of lorazepam

## 2015-01-14 NOTE — Telephone Encounter (Signed)
Medication Refill - [name] is scheduled for 01/17/15 but not sure she will be able to come in due to her best friend, Gay Filler, has been given 24-48 hours left to live.  [name] reported she fully intends to keep her appointment on 01/17/15 as long as her best friend does not die prior to appointment time.  States Dr. Adele Schilder is aware of how close they are and she was diagnosed with Lung and Liver cancer a little over a month ago and illness has progressed faster than was originally thought would have 4-6 months to live.  [name] stated she would like Dr. Adele Schilder to consider refilling her Lorazepam as she is now out this month due to increased anxiety and stress related to dealing with friends terminal illness and now pending possible death within the next few days.  [name] reported he mood overall has been much improved with Lamictal and is quite happy with the medication she is currently taking but would like Dr. Adele Schilder to go ahead and refill her Lorazepam.  [name] will call back on Monday 01/17/15 if she cannot come in for scheduled appointment but plans to be here for appointment as long as her friend does not die prior to that time.  [name] would like a call back to verify if Dr. Adele Schilder is willing to fill prescription.  No suicidal or homicidal ideations at this time.  [name] last evaluated 11/18/14 by Dr. Adele Schilder.

## 2015-01-16 ENCOUNTER — Other Ambulatory Visit: Payer: Self-pay | Admitting: Psychiatry

## 2015-01-17 ENCOUNTER — Ambulatory Visit (HOSPITAL_COMMUNITY): Payer: Self-pay | Admitting: Psychiatry

## 2015-01-17 MED ORDER — LORAZEPAM 0.5 MG PO TABS: 0.5000 mg | ORAL_TABLET | ORAL | Status: DC | PRN

## 2015-01-17 NOTE — Telephone Encounter (Signed)
Telephone call with patient to verify her friend did die over the weekend on Saturday 01/15/15 and patient is attending her friend's funeral this morning so will not be able to keep scheduled evaluation for today.  Informed patient her Lorazepam refill request was approved and will call in to [facility] on [address]. Called and spoke with Wille Glaser, pharmacist at [facility] on [facility] to give one time refill order for patient's Lorazepam.  Requested patient call back this week to reschedule missed appointment from today and patient stated she was doing okay and would call and do so and if any problems.  Patient reported she knew her friend was "at peace" now and doing okay with no SI/HI.

## 2015-01-19 ENCOUNTER — Ambulatory Visit (INDEPENDENT_AMBULATORY_CARE_PROVIDER_SITE_OTHER): Payer: BLUE CROSS/BLUE SHIELD | Admitting: Psychiatry

## 2015-01-19 ENCOUNTER — Encounter (HOSPITAL_COMMUNITY): Payer: Self-pay | Admitting: Psychiatry

## 2015-01-19 VITALS — BP 150/87 | HR 74 | Ht 62.0 in | Wt 124.5 lb

## 2015-01-19 DIAGNOSIS — F411 Generalized anxiety disorder: Secondary | ICD-10-CM

## 2015-01-19 MED ORDER — LORAZEPAM 0.5 MG PO TABS: 0.5000 mg | ORAL_TABLET | ORAL | Status: DC | PRN

## 2015-01-19 MED ORDER — HYDROXYZINE PAMOATE 25 MG PO CAPS: 25.0000 mg | ORAL_CAPSULE | Freq: Every evening | ORAL | Status: DC | PRN

## 2015-01-19 MED ORDER — LAMOTRIGINE 150 MG PO TABS: 150.0000 mg | ORAL_TABLET | Freq: Every day | ORAL | Status: DC

## 2015-01-19 NOTE — Progress Notes (Signed)
[facility] [id number] Progress Note   [name] [id number] 64 y.o.  02/18/15 3:06 PM  Chief Complaint:  My best friend died.  I'm very sad tearful and I cannot sleep.    History of Present Illness:  [name] came for her follow-up appointment.  She is very emotional and tearful.  Her best friend died and she had known her for 25 years.  Patient told she was suffering from cancer and could not tolerate chemotherapy.  She was with her on 02-18-2023 when she died.  Since that she is experiencing increased crying spells, poor sleep, sadness low self-esteem.  She has guilt and admitted some time lack of energy.  She is still has issues at job but she is relieved that October 1 will be her last day at work.  She is planning to cut down her hours since October 1.  She also felt very relieved when the staff found the ring in the store.  She was accused stealing rings and jewelry at [employer].  She is working at [employer].  She is taking Ativan as needed however on the funeral day she needed to tablet because she was very emotional and tearful.  She feel Lamictal is helping her mood swing and anger but she still have crying spells and sadness.  Her appetite is okay.  Her vitals are stable.  She has no rash or itching.  Recently she's seen primary care physician for blood in the urine and she was given ciprofloxacin.  She is planning to see Donne Anon for counseling however due to the recent loss of her best friend she has been not able to scheduled appointment.  Patient denies drinking or using any illegal substances.  Suicidal Ideation: No Plan Formed: No Patient has means to carry out plan: No  Homicidal Ideation: No Plan Formed: No Patient has means to carry out plan: No  Past Psychiatric History/Hospitalization(s) Patient has history of anxiety disorder since age 69.  She has taken BuSpar in the past with limited response.  We had tried Zoloft the patient do not tolerate more than 25 mg  because of GI side effects.   Anxiety: Yes Bipolar Disorder: No Depression: No Mania: No Psychosis: No Schizophrenia: No Personality Disorder: No Hospitalization for psychiatric illness: No History of Electroconvulsive Shock Therapy: No Prior Suicide Attempts: No  Medical History; Patient has GERD, hypertension, acid reflux, also in heart murmur.  She has history of tonsillectomy and shoulder replacement.  Her primary care physician is Dr. Colin Benton.   Review of Systems  Constitutional: Negative.   Genitourinary: Positive for hematuria.  Skin: Negative for itching and rash.  Psychiatric/Behavioral: The patient is nervous/anxious and has insomnia.    Psychiatric: Agitation: No Hallucination: No Depressed Mood: Yes Insomnia: Yes Hypersomnia: No Altered Concentration: No Feels Worthless: No Grandiose Ideas: No Belief In Special Powers: No New/Increased Substance Abuse: No Compulsions: No  Neurologic: Headache: No Seizure: No Paresthesias: No    Outpatient Encounter Prescriptions as of 02-18-2015  Medication Sig  . ciprofloxacin (CIPRO) 500 MG tablet Take 500 mg by mouth.  . bisoprolol-hydrochlorothiazide (ZIAC) 5-6.25 MG per tablet Take 1 tablet by mouth daily.  Marland Kitchen HYDROcodone-homatropine (HYCODAN) 5-1.5 MG/5ML syrup Take 5 mLs by mouth every 8 (eight) hours as needed for cough.  . hydrOXYzine (VISTARIL) 25 MG capsule Take 1 capsule (25 mg total) by mouth at bedtime as needed for anxiety (insomnia).  Marland Kitchen lamoTRIgine (LAMICTAL) 150 MG tablet Take 1 tablet (150 mg total) by  mouth daily.  Marland Kitchen lisinopril (PRINIVIL,ZESTRIL) 40 MG tablet Take 1 tablet (40 mg total) by mouth daily.  Marland Kitchen LORazepam (ATIVAN) 0.5 MG tablet Take 1 tablet (0.5 mg total) by mouth as needed for anxiety (use very sparingly).  Marland Kitchen omeprazole (PRILOSEC OTC) 20 MG tablet Take 20 mg by mouth daily.  . peg 3350 powder (MOVIPREP) 100 G SOLR Take 1 kit (200 g total) by mouth once.  . sucralfate (CARAFATE) 1 GM/10ML  suspension Take 10 mLs (1 g total) by mouth 4 (four) times daily -  with meals and at bedtime.  . traMADol (ULTRAM) 50 MG tablet   . [DISCONTINUED] dexlansoprazole (DEXILANT) 60 MG capsule Take 1 capsule (60 mg total) by mouth daily.  . [DISCONTINUED] lamoTRIgine (LAMICTAL) 100 MG tablet Take 1 tablet (100 mg total) by mouth daily.  . [DISCONTINUED] lansoprazole (PREVACID) 15 MG capsule Take 15 mg by mouth 2 (two) times daily before a meal.  . [DISCONTINUED] LORazepam (ATIVAN) 0.5 MG tablet Take 1 tablet (0.5 mg total) by mouth as needed for anxiety (use very sparingly).    Recent Results (from the past 2160 hour(s))  CBC with Differential     Status: Abnormal   Collection Time: 11/03/14  9:02 AM  Result Value Ref Range   WBC 11.3 (H) 4.0 - 10.5 K/uL   RBC 5.05 3.87 - 5.11 Mil/uL   Hemoglobin 14.8 12.0 - 15.0 g/dL   HCT 45.4 36.0 - 46.0 %   MCV 90.0 78.0 - 100.0 fl   MCHC 32.7 30.0 - 36.0 g/dL   RDW 14.6 11.5 - 15.5 %   Platelets 416.0 (H) 150.0 - 400.0 K/uL   Neutrophils Relative % 61.0 43.0 - 77.0 %   Lymphocytes Relative 31.0 12.0 - 46.0 %   Monocytes Relative 4.5 3.0 - 12.0 %   Eosinophils Relative 1.2 0.0 - 5.0 %   Basophils Relative 2.3 0.0 - 3.0 %   Neutro Abs 6.9 1.4 - 7.7 K/uL   Lymphs Abs 3.5 0.7 - 4.0 K/uL   Monocytes Absolute 0.5 0.1 - 1.0 K/uL   Eosinophils Absolute 0.1 0.0 - 0.7 K/uL   Basophils Absolute 0.3 (H) 0.0 - 0.1 K/uL  Comp Met (CMET)     Status: Abnormal   Collection Time: 11/03/14  9:02 AM  Result Value Ref Range   Sodium 133 (L) 135 - 145 mEq/L   Potassium 4.5 3.5 - 5.1 mEq/L   Chloride 95 (L) 96 - 112 mEq/L   CO2 31 19 - 32 mEq/L   Glucose, Bld 110 (H) 70 - 99 mg/dL   BUN 10 6 - 23 mg/dL   Creatinine, Ser 0.9 0.4 - 1.2 mg/dL   Total Bilirubin 0.5 0.2 - 1.2 mg/dL   Alkaline Phosphatase 92 39 - 117 U/L   AST 20 0 - 37 U/L   ALT 14 0 - 35 U/L   Total Protein 7.3 6.0 - 8.3 g/dL   Albumin 4.4 3.5 - 5.2 g/dL   Calcium 9.8 8.4 - 10.5 mg/dL   GFR  70.30 >60.00 mL/min  Lipase     Status: None   Collection Time: 11/03/14  9:02 AM  Result Value Ref Range   Lipase 44.0 11.0 - 59.0 U/L      Physical Exam: Constitutional:  BP 150/87 mmHg  Pulse 74  Ht '5\' 2"'  (1.575 m)  Wt 124 lb 8 oz (56.473 kg)  BMI 22.77 kg/m2  Musculoskeletal: Strength & Muscle Tone: within normal limits Gait & Station: normal  Patient leans: N/A  Mental Status Examination;  Patient is casually dressed and well groomed.  She is emotional , tearful but cooperative.  She described her mood depressed, anxious and sad .  Her affect is constricted.  Her speech is fast but clear and coherent.  Her attention and concentration is fair.  Her thought processes logical and goal-directed.  She denies any auditory or visual hallucination.  She denies any active or passive suicidal thoughts or homicidal thoughts.  Her psychomotor activity is slightly increased.  Her attention and concentration is okay.  There were no delusions, paranoia or any obsessive thoughts.  Her fund of knowledge is average.  Her immediate and remote memory is good.  She is alert and oriented x3.  Her insight judgment and impulse control is okay.   Established Problem, Stable/Improving (1), Review of Psycho-Social Stressors (1), Review or order clinical lab tests (1), Review and summation of old records (2), Review of Last Therapy Session (1), Review of Medication Regimen & Side Effects (2) and Review of New Medication or Change in Dosage (2)  Assessment: Axis I: Generalized anxiety disorder, rule out bipolar disorder   Axis II: Deferred  Axis III:  Past Medical History  Diagnosis Date  . CHF (congestive heart failure)   . GERD (gastroesophageal reflux disease)   . Hypertension   . 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 Ulcer   . Heart murmur    Plan:  I review her symptoms, history, psychosocial stressors and recent blood work.  She is taking ciprofloxacin for her blood in the urine .   I review notes from her primary care physician at [place] [facility].  She is going through grief.  I encourage her to schedule appointment with Derry Skill for grief counseling.  I would increase Lamictal 150 mg daily .  She is committing of poor sleep and I will add low-dose Vistaril to help anxiety and insomnia.  Discussed benzodiazepine dependence and tolerance.  Recommended to take Ativan 0.5 mg only as needed.  I recommended to call us back if she has any question, concern or if she feels worsening of the symptoms.  I will see her again in 2 months. Time spent 25 minutes.  More than 50% of the time spent in psychoeducation, counseling and coordination of care.  Discuss safety plan that anytime having active suicidal thoughts or homicidal thoughts then patient need to call 911 or go to the local emergency room.  Maija Biggers T., MD 01/19/2015

## 2015-01-25 ENCOUNTER — Encounter: Payer: BLUE CROSS/BLUE SHIELD | Admitting: Internal Medicine

## 2015-03-01 ENCOUNTER — Ambulatory Visit (AMBULATORY_SURGERY_CENTER): Payer: Self-pay

## 2015-03-01 VITALS — Ht 62.0 in | Wt 124.4 lb

## 2015-03-01 DIAGNOSIS — Z800 Family history of malignant neoplasm of digestive organs: Secondary | ICD-10-CM

## 2015-03-01 DIAGNOSIS — Z8719 Personal history of other diseases of the digestive system: Secondary | ICD-10-CM

## 2015-03-01 MED ORDER — MOVIPREP 100 G PO SOLR: ORAL | Status: DC

## 2015-03-01 NOTE — Progress Notes (Signed)
Per pt, no allergies to soy or egg products.Pt not taking any weight loss meds or using  O2 at home. 

## 2015-03-11 ENCOUNTER — Encounter: Payer: Self-pay | Admitting: Internal Medicine

## 2015-03-21 ENCOUNTER — Ambulatory Visit (HOSPITAL_COMMUNITY): Payer: Self-pay | Admitting: Psychiatry

## 2015-03-22 ENCOUNTER — Ambulatory Visit (AMBULATORY_SURGERY_CENTER): Payer: BLUE CROSS/BLUE SHIELD | Admitting: Internal Medicine

## 2015-03-22 ENCOUNTER — Encounter: Payer: Self-pay | Admitting: Internal Medicine

## 2015-03-22 VITALS — BP 123/99 | HR 67 | Temp 97.3°F | Resp 16 | Ht 62.0 in | Wt 124.0 lb

## 2015-03-22 DIAGNOSIS — D125 Benign neoplasm of sigmoid colon: Secondary | ICD-10-CM | POA: Diagnosis not present

## 2015-03-22 DIAGNOSIS — K219 Gastro-esophageal reflux disease without esophagitis: Secondary | ICD-10-CM

## 2015-03-22 DIAGNOSIS — Z1211 Encounter for screening for malignant neoplasm of colon: Secondary | ICD-10-CM | POA: Diagnosis not present

## 2015-03-22 DIAGNOSIS — D124 Benign neoplasm of descending colon: Secondary | ICD-10-CM | POA: Diagnosis not present

## 2015-03-22 DIAGNOSIS — R1314 Dysphagia, pharyngoesophageal phase: Secondary | ICD-10-CM

## 2015-03-22 DIAGNOSIS — D127 Benign neoplasm of rectosigmoid junction: Secondary | ICD-10-CM

## 2015-03-22 DIAGNOSIS — K621 Rectal polyp: Secondary | ICD-10-CM | POA: Diagnosis not present

## 2015-03-22 DIAGNOSIS — K449 Diaphragmatic hernia without obstruction or gangrene: Secondary | ICD-10-CM | POA: Diagnosis not present

## 2015-03-22 MED ORDER — OMEPRAZOLE 40 MG PO CPDR: 40.0000 mg | DELAYED_RELEASE_CAPSULE | Freq: Two times a day (BID) | ORAL | Status: DC

## 2015-03-22 MED ORDER — SODIUM CHLORIDE 0.9 % IV SOLN: 500.0000 mL | INTRAVENOUS | Status: DC

## 2015-03-22 NOTE — Patient Instructions (Signed)

## 2015-03-22 NOTE — Progress Notes (Signed)
Report to PACU, RN, vss, BBS= Clear.  

## 2015-03-22 NOTE — Op Note (Signed)
[place]  [address]. [place], [id number]   ENDOSCOPY PROCEDURE REPORT  PATIENT: Crystal, Mcmillan  MR#: [id number] BIRTHDATE: 04-18-1951 , 63  yrs. old GENDER: female ENDOSCOPIST: Jerene Bears, MD PROCEDURE DATE:  03/22/2015 PROCEDURE:  EGD w/ balloon dilation ASA CLASS:     Class II INDICATIONS:  epigastric pain, dysphagia, and history of GERD. MEDICATIONS: Monitored anesthesia care, Propofol 150 mg IV, and Lidocaine 100 mg IV TOPICAL ANESTHETIC: none  DESCRIPTION OF PROCEDURE: After the risks benefits and alternatives of the procedure were thoroughly explained, informed consent was obtained.  The LB [id number] endoscope was introduced through the mouth and advanced to the second portion of the duodenum , Without limitations.  The instrument was slowly withdrawn as the mucosa was fully examined.   ESOPHAGUS: The mucosa of the esophagus appeared normal, though the esophagus was foreshortened and tortuous likely due to large hiatal hernia. Probable Schatzki's ring at 30 cm.  Given reported dysphagia, dilation was performed across the GE junction. Initial dilation 15 mm, no change noted on inspection, thus dilation was performed to 16.5 mm.  The balloon was held inflated for 60 seconds.  Following this dilation, there was no heme seen.Marland Kitchen  STOMACH: A large, 8 cm, hiatal hernia was noted.   The mucosa of the stomach appeared normal.  DUODENUM: The duodenal mucosa showed no abnormalities in the bulb and 2nd part of the duodenum.  Retroflexed views revealed a hiatal hernia.     The scope was then withdrawn from the patient and the procedure completed.  COMPLICATIONS: There were no immediate complications.  ENDOSCOPIC IMPRESSION: 1.   The mucosa of the esophagus appeared normal; Schatzki's ring 30 cm; dilation to 16.5 mm with TTS balloon 2.   Large hiatal hernia 3.   The mucosa of the stomach appeared normal 4.   The duodenal mucosa showed no  abnormalities in the bulb and 2nd part of the duodenum  RECOMMENDATIONS: 1.  Anti-reflux regimen to be follow 2.  Continue taking your PPI (antiacid medicine) once daily.  It is best to be taken 20-30 minutes prior to breakfast meal. 3.  If dysphagia persists would recommend barium swallow and high-resolution esophageal manometry  eSigned:  Jerene Bears, MD 2015-03-22 23:55:[id number]     UR:[facility] [place], F.N.P.-B.C and The Patient

## 2015-03-22 NOTE — Progress Notes (Signed)
Called to room to assist during endoscopic procedure.  Patient ID and intended procedure confirmed with present staff. Received instructions for my participation in the procedure from the performing physician.  

## 2015-03-22 NOTE — Progress Notes (Signed)
Pt. Complains of 'gas pain".  She is passing large amounts of gas.  Abdomen is soft at present, but was distended on admission.  Encouraged to try to relax, Lie on left to right sides and bear down to expel air.

## 2015-03-22 NOTE — Op Note (Signed)
[place]  [address]. [place], [address]   COLONOSCOPY PROCEDURE REPORT  PATIENT: [name], [name]  MR#: [id number] BIRTHDATE: 1950/11/13 , 63  yrs. old GENDER: female ENDOSCOPIST: Jerene Bears, MD PROCEDURE DATE:  03/22/2015 PROCEDURE:   Colonoscopy, screening and Colonoscopy with snare polypectomy First Screening Colonoscopy - Avg.  risk and is 50 yrs.  old or older - No.  Prior Negative Screening - Now for repeat screening. 10 or more years since last screening  History of Adenoma - Now for follow-up colonoscopy & has been > or = to 3 yrs.  N/A  Polyps Removed Today ASA CLASS:   Class II INDICATIONS:Screening for colonic neoplasia and Colorectal Neoplasm Risk Assessment for this procedure is average risk. MEDICATIONS: Monitored anesthesia care, Propofol 150 mg IV, and Residual sedation present  DESCRIPTION OF PROCEDURE:   After the risks benefits and alternatives of the procedure were thoroughly explained, informed consent was obtained.  The digital rectal exam revealed no rectal mass.   The LB [id number]  endoscope was introduced through the anus and advanced to the cecum, which was identified by both the appendix and ileocecal valve. No adverse events experienced. The quality of the prep was (MoviPrep was used) excellent.  The instrument was then slowly withdrawn as the colon was fully examined.   COLON FINDINGS: Three sessile polyps ranging between 3-17mm in size were found in the descending colon and sigmoid colon. Polypectomies were performed with a cold snare.  The resection was complete, the polyp tissue was completely retrieved and sent to histology.   There was mild diverticulosis noted in the sigmoid colon.  Retroflexed views revealed internal hemorrhoids. The time to cecum = 2.6 Withdrawal time = 8.6   The scope was withdrawn and the procedure completed. COMPLICATIONS: There were no immediate complications.  ENDOSCOPIC IMPRESSION: 1.    Three sessile polyps ranging between 3-67mm in size were found in the descending colon (1) and sigmoid colon (2); polypectomies were performed with a cold snare 2.   Mild diverticulosis was noted in the sigmoid colon  RECOMMENDATIONS: 1.  Await pathology results 2.  High fiber diet 3.  Timing of repeat colonoscopy will be determined by pathology findings. 4.  You will receive a letter within 1-2 weeks with the results of your biopsy as well as final recommendations.  Please call my office if you have not received a letter after 3 weeks.  eSigned:  Jerene Bears, MD 2015-03-22 [id number]   cc: Vicenta Aly, F.N.P.-B.C and The Patient

## 2015-03-23 ENCOUNTER — Telehealth: Payer: Self-pay

## 2015-03-23 NOTE — Telephone Encounter (Signed)
Left message on answering machine. 

## 2015-03-24 ENCOUNTER — Telehealth: Payer: Self-pay | Admitting: Internal Medicine

## 2015-03-24 NOTE — Telephone Encounter (Signed)
Pt states her rectal area is a little sore following her colon. Discussed with her that she could have soreness if her internal hemorrhoids were a little irritated. Instructed her to call us back if this did not improve. Pt verbalized understanding.

## 2015-03-24 NOTE — Telephone Encounter (Signed)
KQASUO:[id number];KFEXMDY Name:COPAY: Non-Preferred and Formulary Exclusion Copay Tier Exception Generic, Branded *[employer]*;Status:Approved;Coverage Start Date:03/03/2015;Coverage End Date:03/23/2016;

## 2015-03-25 ENCOUNTER — Other Ambulatory Visit: Payer: Self-pay | Admitting: Internal Medicine

## 2015-03-25 NOTE — Telephone Encounter (Signed)
Case [id number] approved per [facility]

## 2015-03-28 ENCOUNTER — Telehealth: Payer: Self-pay | Admitting: Internal Medicine

## 2015-03-28 NOTE — Telephone Encounter (Signed)
Pt said she is returning [name]'s call

## 2015-03-28 NOTE — Telephone Encounter (Signed)
I contacted [facility] today who states rx still wont go through. I then contacted [facility] (who I got written authorization from already). They state that rx is not covered under formulary (phone [phone]). I was then transferred to fax [phone]. I got same response there but was actually told that the case had been reviewed and was covered and I needed to call benefits to get rx approved fully. After being on hold for an extended amount of time, I was finally able to talk to  [place] in benefits (phone [phone]) who was able to get rx to go through at $17.70 per month. I have contacted [place] who states rx now goes through. I have also advised patient of this.

## 2015-03-28 NOTE — Telephone Encounter (Signed)
Pt calling having problems getting the prescription filled by [facility] [address]. [name] looks like you have been working on this for her.

## 2015-03-30 ENCOUNTER — Encounter: Payer: Self-pay | Admitting: Internal Medicine

## 2015-04-01 ENCOUNTER — Telehealth (HOSPITAL_COMMUNITY): Payer: Self-pay

## 2015-04-01 DIAGNOSIS — F411 Generalized anxiety disorder: Secondary | ICD-10-CM

## 2015-04-01 MED ORDER — LORAZEPAM 0.5 MG PO TABS: 0.5000 mg | ORAL_TABLET | ORAL | Status: DC | PRN

## 2015-04-01 NOTE — Telephone Encounter (Signed)
Yes ok to refill until her appointment.

## 2015-04-01 NOTE — Telephone Encounter (Signed)
Called in refill order for patient's authorized refill of Lorazepam per authorization by Dr. Adele Schilder to [facility] on [facility] with Wille Glaser, pharmacist.  Called patient to inform #20 of Lorazepam 0.5mg  pills were called into her [place] for patient to use sparingly and to keep appointment now set for 04/20/15.

## 2015-04-01 NOTE — Telephone Encounter (Signed)
Requests refill of ativan until she can get in to see you. Canceled last eval on 04/10/2015 when her friend passed away over that weekend.  Patient states plan to see you in the coming month with first available.

## 2015-04-20 ENCOUNTER — Encounter (HOSPITAL_COMMUNITY): Payer: Self-pay | Admitting: Psychiatry

## 2015-04-20 ENCOUNTER — Ambulatory Visit (INDEPENDENT_AMBULATORY_CARE_PROVIDER_SITE_OTHER): Payer: BLUE CROSS/BLUE SHIELD | Admitting: Psychiatry

## 2015-04-20 VITALS — BP 126/82 | HR 64 | Ht 62.0 in | Wt 125.2 lb

## 2015-04-20 DIAGNOSIS — F411 Generalized anxiety disorder: Secondary | ICD-10-CM | POA: Diagnosis not present

## 2015-04-20 MED ORDER — LORAZEPAM 0.5 MG PO TABS: 0.5000 mg | ORAL_TABLET | ORAL | Status: DC | PRN

## 2015-04-20 MED ORDER — LAMOTRIGINE 150 MG PO TABS: 150.0000 mg | ORAL_TABLET | Freq: Every day | ORAL | Status: DC

## 2015-04-20 NOTE — Progress Notes (Signed)
[facility] [phone] Progress Note   [name] [id number] 64 y.o.  04/20/2015 3:12 PM  Chief Complaint:  Medication management and follow-up.      History of Present Illness:  [name] came for her follow-up appointment.  She still have some grief after losing her best friend but overall she is doing better.  Her biggest stresses at work.  She is hoping to get part-time from October 1.  She has not seen the therapist because she is been busy seeing other physicians.  She wants to see all her physicians before, October 1 .  Recently she has GI procedure and she was diagnosed with precancerous polyps which she is planning to have procedure very soon.  She sleeping better.  She like increase Lamictal.  She is no longer taking Vistaril but is still take lorazepam 0.5 mg perceiving anxiety and panic.  She has no rash or itching.  She has fewer crying spells but overall her energy level is good.  Her appetite is okay.  Her vitals are stable.  Patient denies drinking or using any illegal substances.  She is hoping to find new friends and liked to go to church on a regular basis.  Patient is working full-time at [employer] however she is going to work part-time from October 1.  Suicidal Ideation: No Plan Formed: No Patient has means to carry out plan: No  Homicidal Ideation: No Plan Formed: No Patient has means to carry out plan: No  Past Psychiatric History/Hospitalization(s) Patient has history of anxiety disorder since age 89.  She has taken BuSpar in the past with limited response.  We had tried Zoloft the patient do not tolerate more than 25 mg because of GI side effects.   Anxiety: Yes Bipolar Disorder: No Depression: No Mania: No Psychosis: No Schizophrenia: No Personality Disorder: No Hospitalization for psychiatric illness: No History of Electroconvulsive Shock Therapy: No Prior Suicide Attempts: No  Medical History; Patient has GERD, hypertension, acid reflux, also in  heart murmur.  She has history of tonsillectomy and shoulder replacement.  Her primary care physician is Dr. Colin Benton.   Review of Systems  Constitutional: Negative.   Genitourinary: Positive for hematuria.  Skin: Negative for itching and rash.  Psychiatric/Behavioral: The patient is nervous/anxious and has insomnia.    Psychiatric: Agitation: No Hallucination: No Depressed Mood: No Insomnia: No Hypersomnia: No Altered Concentration: No Feels Worthless: No Grandiose Ideas: No Belief In Special Powers: No New/Increased Substance Abuse: No Compulsions: No  Neurologic: Headache: No Seizure: No Paresthesias: No    Outpatient Encounter Prescriptions as of 04/20/2015  Medication Sig  . lamoTRIgine (LAMICTAL) 150 MG tablet Take 1 tablet (150 mg total) by mouth daily.  Marland Kitchen lisinopril (PRINIVIL,ZESTRIL) 40 MG tablet Take 1 tablet (40 mg total) by mouth daily.  Marland Kitchen LORazepam (ATIVAN) 0.5 MG tablet Take 1 tablet (0.5 mg total) by mouth as needed for anxiety (use very sparingly).  Marland Kitchen omeprazole (PRILOSEC) 40 MG capsule Take 1 capsule (40 mg total) by mouth 2 (two) times daily.  . traMADol (ULTRAM) 50 MG tablet   . [DISCONTINUED] bisoprolol-hydrochlorothiazide (ZIAC) 5-6.25 MG per tablet Take 1 tablet by mouth daily.  . [DISCONTINUED] HYDROcodone-homatropine (HYCODAN) 5-1.5 MG/5ML syrup Take 5 mLs by mouth every 8 (eight) hours as needed for cough. (Patient not taking: Reported on 03/01/2015)  . [DISCONTINUED] hydrOXYzine (VISTARIL) 25 MG capsule Take 1 capsule (25 mg total) by mouth at bedtime as needed for anxiety (insomnia). (Patient not taking: Reported on 03/01/2015)  . [  DISCONTINUED] lamoTRIgine (LAMICTAL) 150 MG tablet Take 1 tablet (150 mg total) by mouth daily.  . [DISCONTINUED] LORazepam (ATIVAN) 0.5 MG tablet Take 1 tablet (0.5 mg total) by mouth as needed for anxiety (use very sparingly).  . [DISCONTINUED] omeprazole (PRILOSEC OTC) 20 MG tablet Take 20 mg by mouth 2 (two) times daily.  Take 2 pills bid   No facility-administered encounter medications on file as of 04/20/2015.    No results found for this or any previous visit (from the past 2160 hour(s)).    Physical Exam: Constitutional:  BP 126/82 mmHg  Pulse 64  Ht 5\' 2"  (1.575 m)  Wt 125 lb 3.2 oz (56.79 kg)  BMI 22.89 kg/m2  Musculoskeletal: Strength & Muscle Tone: within normal limits Gait & Station: normal Patient leans: N/A  Mental status examination Patient is casually dressed and fairly groomed.  She maintained good eye contact.  She is pleasant and cooperative.  Her speech is fast but clear and coherent.  She described her mood euthymic and her affect is appropriate.  She denies any auditory or visual hallucination.  She denies any active or passive suicidal thoughts or homicidal thought.  There were no delusions, paranoia or any obsessive thoughts.  Her attention and concentration is fair.  Her fund of knowledge is adequate.  Her psychomotor activity is slightly increased.  She is alert and oriented 3.  Her cognition is good.  Her insight judgment and impulse control is okay.   Established Problem, Stable/Improving (1), Review of Psycho-Social Stressors (1), Review of Last Therapy Session (1) and Review of Medication Regimen & Side Effects (2)  Assessment: Axis I: Generalized anxiety disorder, rule out bipolar disorder   Axis II: Deferred  Axis III:  Past Medical History  Diagnosis Date  . CHF (congestive heart failure)   . GERD (gastroesophageal reflux disease)   . Hypertension   . 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 Ulcer     8 years ago  . Heart murmur   . Abdominal pain, epigastric    Plan:  Patient is fairly stable on her current psycho trophic medication.  I will continue Lamictal 150 mg daily.  She has no rash, itching her tremors.  She is no longer taking Vistaril and I will stop it.  Continue lorazepam 0.5 mg as needed for severe anxiety and nervousness.  Recommended to  call us back if she has any question or any concern.  Follow-up in 3 months.  Kashish Yglesias T., MD 04/20/2015

## 2015-05-10 ENCOUNTER — Telehealth: Payer: Self-pay | Admitting: Internal Medicine

## 2015-05-10 ENCOUNTER — Other Ambulatory Visit: Payer: Self-pay | Admitting: *Deleted

## 2015-05-10 MED ORDER — SUCRALFATE 1 GM/10ML PO SUSP: 1.0000 g | Freq: Three times a day (TID) | ORAL | Status: DC

## 2015-05-10 NOTE — Telephone Encounter (Signed)
GERD diet Liquid Carafate 1 g before meals and at bedtime for 1 month

## 2015-05-10 NOTE — Telephone Encounter (Signed)
Rx sent. Left a message for patient to call back. 

## 2015-05-10 NOTE — Telephone Encounter (Signed)
Left a message for patient to call back. 

## 2015-05-10 NOTE — Telephone Encounter (Signed)
Spoke with patient and she states she is having a lot of acid reflux. She is stressed at this time. She is taking Omeprazole BID and still having problems. Please, advise.

## 2015-05-11 NOTE — Telephone Encounter (Signed)
Spoke with pt and she is aware. Diet mailed to pt.

## 2015-06-13 ENCOUNTER — Other Ambulatory Visit (HOSPITAL_COMMUNITY): Payer: Self-pay | Admitting: Psychiatry

## 2015-06-13 ENCOUNTER — Other Ambulatory Visit (HOSPITAL_COMMUNITY): Payer: Self-pay | Admitting: *Deleted

## 2015-06-13 DIAGNOSIS — F411 Generalized anxiety disorder: Secondary | ICD-10-CM

## 2015-06-13 NOTE — Telephone Encounter (Signed)
PT called for a refill for Lorazepam 0.5mg . Informed pt prescription will not be ready until 8/2. PT verbalizes understanding. Please call pt once prescription is written.

## 2015-06-14 ENCOUNTER — Other Ambulatory Visit (HOSPITAL_COMMUNITY): Payer: Self-pay | Admitting: Psychiatry

## 2015-06-14 NOTE — Telephone Encounter (Signed)
No early refills of lorazepam

## 2015-06-14 NOTE — Telephone Encounter (Signed)
Per Dr. Marchia Bond, please call and inform pt it is too early for refill for Lorazepam. Pt is not due for a refill until 06/20/15.

## 2015-06-14 NOTE — Telephone Encounter (Signed)
PT requested refill for Lorazepam 0.5mg  too soon. PT is not due for a refill until 06/20/15.

## 2015-06-20 MED ORDER — LORAZEPAM 0.5 MG PO TABS: 0.5000 mg | ORAL_TABLET | ORAL | Status: DC | PRN

## 2015-06-20 NOTE — Telephone Encounter (Signed)
Met with Dr. Adele Schilder who authorized a one time refill of patient's prescribed Lorazepam.  Called in order to patient's [facility] and spoke to [facility], pharmacist to give one time refill order.  Called patient to inform one time order was called in and she could pick it up today.

## 2015-06-20 NOTE — Telephone Encounter (Signed)
Medication refill request - patient called this AM requesting a refill now for her Lorazepam as states she was told to call back on the 06/20/15 to request new order then as was too early to refill the previous week.  Patient would like a new order called in to her [facility] and returns to see Dr. Adele Schilder on 07/21/15.

## 2015-07-21 ENCOUNTER — Ambulatory Visit (HOSPITAL_COMMUNITY): Payer: Self-pay | Admitting: Psychiatry

## 2015-08-03 ENCOUNTER — Telehealth: Payer: Self-pay | Admitting: Internal Medicine

## 2015-08-03 NOTE — Telephone Encounter (Signed)
Pt states she is very stressed with her job. She has signed papers to retire but has to work until the end of Oct 1st part of Nov. Pt wants to know if Dr. Hilarie Fredrickson has any suggestions she can try. She is taking her meds but having problems with a "terrible sour stomach." States it is very bad in the am and she cannot eat much and she has some nausea. Pt is eating a lot of bananas and saltine crackers. States she does not feel it is reflux. Please advise.

## 2015-08-03 NOTE — Telephone Encounter (Signed)
Her stomach issues in the past have directly correlated with stress levels In the past she responded to Dexilant 60 mg daily and liquid Carafate before meals and at bedtime. This can be continued She did benefit from lorazepam 0.5 mg 3 times a day when necessary which she can receive again over the next 4-6 weeks. I do not want this to be a long-term medication If Dexilant was working better, try to switch omeprazole twice a day to Dexilant 60 mg daily

## 2015-08-04 NOTE — Telephone Encounter (Signed)
Left message for pt to call back  °

## 2015-08-08 MED ORDER — LORAZEPAM 0.5 MG PO TABS: ORAL_TABLET | ORAL | Status: DC

## 2015-08-08 NOTE — Telephone Encounter (Signed)
Left message for pt to call back  °

## 2015-08-08 NOTE — Telephone Encounter (Signed)
Pt aware and script for ativan called to pharmacy.

## 2015-08-17 ENCOUNTER — Telehealth: Payer: Self-pay | Admitting: Internal Medicine

## 2015-08-17 MED ORDER — DIPHENOXYLATE-ATROPINE 2.5-0.025 MG PO TABS: 1.0000 | ORAL_TABLET | Freq: Four times a day (QID) | ORAL | Status: DC | PRN

## 2015-08-17 NOTE — Telephone Encounter (Signed)
Pt again states she is under a lot of stress from work. Pt states that every time she eats the food runs straight through her. States this has been going on for about 2 weeks. Can pt try lomotil? Please advise.

## 2015-08-17 NOTE — Telephone Encounter (Signed)
Yes, QIDPRN Call if not getting better

## 2015-08-17 NOTE — Telephone Encounter (Signed)
Script sent to pharmacy and pt aware. 

## 2016-05-10 ENCOUNTER — Other Ambulatory Visit: Payer: Self-pay | Admitting: Nurse Practitioner

## 2016-05-10 DIAGNOSIS — Z1231 Encounter for screening mammogram for malignant neoplasm of breast: Secondary | ICD-10-CM

## 2016-05-17 ENCOUNTER — Ambulatory Visit: Payer: Self-pay

## 2017-03-12 ENCOUNTER — Other Ambulatory Visit: Payer: Self-pay | Admitting: Physician Assistant

## 2017-03-12 DIAGNOSIS — Z1231 Encounter for screening mammogram for malignant neoplasm of breast: Secondary | ICD-10-CM

## 2017-08-20 ENCOUNTER — Telehealth: Payer: Self-pay | Admitting: Internal Medicine

## 2017-08-20 NOTE — Telephone Encounter (Signed)
Pt states she was taking prevacid otc 2 in the am and 2 in the pm and was doing fine. States this has been recalled and she cannot get it now. She has tried nexium otc and states it did not work. Pt has tried omeprazole in the past and states it doesn't help. Pt wants to know what else she might be able to try. Please advise.

## 2017-08-20 NOTE — Telephone Encounter (Signed)
Left message for pt to call back  °

## 2017-08-20 NOTE — Telephone Encounter (Signed)
Can prescribe lansoprazole 40 mg BID-AC which should be covered and available

## 2017-08-21 ENCOUNTER — Other Ambulatory Visit: Payer: Self-pay

## 2017-08-21 MED ORDER — LANSOPRAZOLE 30 MG PO TBDP
30.0000 mg | ORAL_TABLET | Freq: Two times a day (BID) | ORAL | 6 refills | Status: DC
Start: 2017-08-21 — End: 2019-04-01

## 2017-08-21 NOTE — Telephone Encounter (Signed)
Pt aware. Script sent to pharmacy for lansoprazole 30mg  bid.

## 2018-04-25 ENCOUNTER — Other Ambulatory Visit: Payer: Self-pay | Admitting: Internal Medicine

## 2018-07-09 ENCOUNTER — Other Ambulatory Visit: Payer: Self-pay | Admitting: Internal Medicine

## 2018-09-18 ENCOUNTER — Ambulatory Visit: Payer: Self-pay | Admitting: Gastroenterology

## 2018-09-25 ENCOUNTER — Ambulatory Visit: Payer: Self-pay | Admitting: Gastroenterology

## 2019-03-26 ENCOUNTER — Encounter: Payer: Self-pay | Admitting: *Deleted

## 2019-03-30 ENCOUNTER — Other Ambulatory Visit: Payer: Self-pay

## 2019-03-30 ENCOUNTER — Ambulatory Visit: Payer: Self-pay | Admitting: Internal Medicine

## 2019-03-30 ENCOUNTER — Telehealth: Payer: Self-pay | Admitting: *Deleted

## 2019-03-30 ENCOUNTER — Encounter: Payer: Self-pay | Admitting: Internal Medicine

## 2019-03-30 VITALS — Ht 62.0 in | Wt 115.0 lb

## 2019-03-30 DIAGNOSIS — Z86010 Personal history of colonic polyps: Secondary | ICD-10-CM

## 2019-03-30 DIAGNOSIS — K219 Gastro-esophageal reflux disease without esophagitis: Secondary | ICD-10-CM

## 2019-03-30 DIAGNOSIS — K449 Diaphragmatic hernia without obstruction or gangrene: Secondary | ICD-10-CM

## 2019-03-30 NOTE — Telephone Encounter (Signed)
[place]: [id number] Health Plan: [phone]  [id number] LYYTK: CIMCARE PTWSFKC: LEXNTZGY

## 2019-03-30 NOTE — Progress Notes (Signed)
   Subjective:    Patient ID: [name], female    DOB: 12-29-1950, 68 y.o.   MRN: [id number]  This service was provided via telemedicine.  Doximity app tried but did not work for her, thus telephone encounter. The patient was located at home The provider was located in provider's GI office. The patient did consent to this telephone visit and is aware of possible charges through their insurance for this visit.   The persons participating in this telemedicine service were the patient and I. Time spent on call: 15 min  HPI [name] is a 68 year old female with a history of GERD with hiatal hernia, remote PUD, adenomatous colon polyps who is seen for follow-up.  She has not been seen since 2016.  She is seen virtually today in the setting of COVID-19 pandemic.  She reports that she has been having more more issues with heartburn reflux symptoms.  This is acid indigestion, sour stomach, pyrosis and burping.  No abdominal pain.  Is having some postnasal drip which she states aggravates her reflux.  She thinks that this is allergic rhinorrhea and drainage.  She has been using lansoprazole over-the-counter previously 15 mg twice a day.  Now she is using 30 mg twice a day and her primary care provider added famotidine 20 mg twice a day.  With these changes her heartburn is coming under better control but not full control.  She previously had esophageal dilation and she reports her dysphagia is no longer "bad".  No odynophagia.  Again no abdominal pain.  Bowel movements have been regular without blood in her stool or melena.  Her last upper endoscopy and colonoscopy were performed on 03/22/2015.  Esophagus was normal, there was a Schatzki's ring at the GE junction dilated to 16.5 with TTS balloon.  There was a large hiatal hernia.  Colonoscopy revealed 3 colon polyps 2 of which were adenomatous one hyperplastic.  There was diverticulosis.  She remains active.  She is working part-time at [employer].   She is also playing golf several days a week on most weeks.  She does feel stressed which she feels worsens her symptoms.   Review of Systems As per HPI, otherwise negative  Current Medications, Allergies, Past Medical History, Past Surgical History, Family History and Social History were reviewed in [employer] record.     Objective:   Physical Exam No PE, virtual visit     Assessment & Plan:  68 year old female with a history of GERD with hiatal hernia, remote PUD, adenomatous colon polyps who is seen for follow-up.   1.  GERD with hiatal hernia --GERD symptoms have been uncontrolled and now are mostly controlled though not completely on high-dose PPI and H2 blocker.  I recommended upper endoscopy to evaluate her reflux disease.  I am going to change to Dexilant a more potent and long-acting PPI --Discontinue lansoprazole --Begin Dexilant 60 mg daily, 30 minutes before breakfast --She could still use famotidine 20 mg twice daily if needed but with Dexilant she may not need H2 blocker or may only need an evening dose. --Her hiatal hernia definitively worsens her reflux disease and we will reevaluate at upper endoscopy --EGD and LEC  2.  History of adenomatous colon polyps --surveillance colonoscopy recommended in 2021

## 2019-04-01 ENCOUNTER — Telehealth: Payer: Self-pay | Admitting: *Deleted

## 2019-04-01 MED ORDER — DEXLANSOPRAZOLE 60 MG PO CPDR: 60.0000 mg | DELAYED_RELEASE_CAPSULE | Freq: Every day | ORAL | 0 refills | Status: DC

## 2019-04-01 NOTE — Addendum Note (Signed)
Addended by: Larina Bras on: 04/01/2019 10:47 AM   Modules accepted: Orders

## 2019-04-01 NOTE — Patient Instructions (Addendum)
Discontinue lansoprazole.  We have sent the following medications to your pharmacy for you to pick up at your convenience: Dexilant 60 mg daily, 30 minutes before breakfast  Please purchase the following medications over the counter and take as directed: famotidine 20 mg twice daily if needed (but with Dexilant you may not need this or may only need an evening dose)  You will be due for a recall colonoscopy in 03/2020. We will send you a reminder in the mail when it gets closer to that time.  Please call to schedule endoscopy.

## 2019-04-01 NOTE — Telephone Encounter (Signed)
Left message for patient to schedule endoscopy as recommended by Dr Hilarie Fredrickson at her televisit yesterday,

## 2019-04-02 NOTE — Telephone Encounter (Signed)
Left message for patient to call back  

## 2019-04-07 NOTE — Telephone Encounter (Signed)
I have left a message for patient to call back. In addition, I have already mailed a copy of her after visit summary to her which explains that she needs to call us to schedule endoscopy. Dr Hilarie Fredrickson asked that she schedule endoscopy as well. We will await a return call from her as this is the 3rd message I have left her.

## 2020-02-03 ENCOUNTER — Observation Stay (HOSPITAL_COMMUNITY)
Admission: EM | Admit: 2020-02-03 | Discharge: 2020-02-04 | Disposition: A | Discharge status: Home / Self Care | Payer: Medicare Other | Attending: Internal Medicine | Admitting: Internal Medicine

## 2020-02-03 ENCOUNTER — Emergency Department (HOSPITAL_COMMUNITY): Payer: Medicare Other

## 2020-02-03 ENCOUNTER — Other Ambulatory Visit: Payer: Self-pay

## 2020-02-03 DIAGNOSIS — R262 Difficulty in walking, not elsewhere classified: Secondary | ICD-10-CM

## 2020-02-03 DIAGNOSIS — F17210 Nicotine dependence, cigarettes, uncomplicated: Secondary | ICD-10-CM | POA: Diagnosis not present

## 2020-02-03 DIAGNOSIS — Z79899 Other long term (current) drug therapy: Secondary | ICD-10-CM | POA: Diagnosis not present

## 2020-02-03 DIAGNOSIS — E785 Hyperlipidemia, unspecified: Secondary | ICD-10-CM | POA: Insufficient documentation

## 2020-02-03 DIAGNOSIS — F17200 Nicotine dependence, unspecified, uncomplicated: Secondary | ICD-10-CM

## 2020-02-03 DIAGNOSIS — I10 Essential (primary) hypertension: Secondary | ICD-10-CM

## 2020-02-03 DIAGNOSIS — K219 Gastro-esophageal reflux disease without esophagitis: Secondary | ICD-10-CM | POA: Insufficient documentation

## 2020-02-03 DIAGNOSIS — R269 Unspecified abnormalities of gait and mobility: Secondary | ICD-10-CM | POA: Insufficient documentation

## 2020-02-03 DIAGNOSIS — E876 Hypokalemia: Secondary | ICD-10-CM | POA: Insufficient documentation

## 2020-02-03 DIAGNOSIS — E871 Hypo-osmolality and hyponatremia: Secondary | ICD-10-CM | POA: Diagnosis not present

## 2020-02-03 DIAGNOSIS — I509 Heart failure, unspecified: Secondary | ICD-10-CM | POA: Diagnosis not present

## 2020-02-03 DIAGNOSIS — N179 Acute kidney failure, unspecified: Secondary | ICD-10-CM | POA: Insufficient documentation

## 2020-02-03 DIAGNOSIS — R634 Abnormal weight loss: Secondary | ICD-10-CM

## 2020-02-03 DIAGNOSIS — R7303 Prediabetes: Secondary | ICD-10-CM | POA: Diagnosis not present

## 2020-02-03 DIAGNOSIS — I110 Hypertensive heart disease with heart failure: Secondary | ICD-10-CM | POA: Insufficient documentation

## 2020-02-03 DIAGNOSIS — Z20822 Contact with and (suspected) exposure to covid-19: Secondary | ICD-10-CM | POA: Diagnosis not present

## 2020-02-03 DIAGNOSIS — Z9181 History of falling: Secondary | ICD-10-CM

## 2020-02-03 LAB — BASIC METABOLIC PANEL
Anion gap: 15 (ref 5–15)
Anion gap: 11 (ref 5–15)
BUN: 22 mg/dL (ref 8–23)
BUN: 20 mg/dL (ref 8–23)
CO2: 28 mmol/L (ref 22–32)
CO2: 26 mmol/L (ref 22–32)
Calcium: 9 mg/dL (ref 8.9–10.3)
Calcium: 8.3 mg/dL — ABNORMAL LOW (ref 8.9–10.3)
Chloride: 80 mmol/L — ABNORMAL LOW (ref 98–111)
Chloride: 90 mmol/L — ABNORMAL LOW (ref 98–111)
Creatinine, Ser: 1.36 mg/dL — ABNORMAL HIGH (ref 0.44–1.00)
Creatinine, Ser: 1.5 mg/dL — ABNORMAL HIGH (ref 0.44–1.00)
GFR calc Af Amer: 47 mL/min — ABNORMAL LOW (ref >60)
GFR calc Af Amer: 42 mL/min — ABNORMAL LOW (ref >60)
GFR calc non Af Amer: 40 mL/min — ABNORMAL LOW (ref >60)
GFR calc non Af Amer: 36 mL/min — ABNORMAL LOW (ref >60)
Glucose, Bld: 114 mg/dL — ABNORMAL HIGH (ref 70–99)
Glucose, Bld: 222 mg/dL — ABNORMAL HIGH (ref 70–99)
Potassium: 3.4 mmol/L — ABNORMAL LOW (ref 3.5–5.1)
Potassium: 3.3 mmol/L — ABNORMAL LOW (ref 3.5–5.1)
Sodium: 123 mmol/L — ABNORMAL LOW (ref 135–145)
Sodium: 127 mmol/L — ABNORMAL LOW (ref 135–145)

## 2020-02-03 LAB — URINALYSIS, ROUTINE W REFLEX MICROSCOPIC
Bilirubin Urine: NEGATIVE
Glucose, UA: NEGATIVE mg/dL
Hgb urine dipstick: NEGATIVE
Ketones, ur: NEGATIVE mg/dL
Leukocytes,Ua: NEGATIVE
Nitrite: NEGATIVE
Protein, ur: NEGATIVE mg/dL
Specific Gravity, Urine: 1.008 (ref 1.005–1.030)
pH: 6 (ref 5.0–8.0)

## 2020-02-03 LAB — CBC
HCT: 39.1 % (ref 36.0–46.0)
Hemoglobin: 13.9 g/dL (ref 12.0–15.0)
MCH: 30.7 pg (ref 26.0–34.0)
MCHC: 35.5 g/dL (ref 30.0–36.0)
MCV: 86.3 fL (ref 80.0–100.0)
Platelets: 377 10*3/uL (ref 150–400)
RBC: 4.53 MIL/uL (ref 3.87–5.11)
RDW: 13.2 % (ref 11.5–15.5)
WBC: 11.6 10*3/uL — ABNORMAL HIGH (ref 4.0–10.5)
nRBC: 0 % (ref 0.0–0.2)

## 2020-02-03 LAB — HIV ANTIBODY (ROUTINE TESTING W REFLEX): HIV Screen 4th Generation wRfx: NONREACTIVE

## 2020-02-03 LAB — SODIUM, URINE, RANDOM: Sodium, Ur: 46 mmol/L

## 2020-02-03 LAB — CBG MONITORING, ED: Glucose-Capillary: 108 mg/dL — ABNORMAL HIGH (ref 70–99)

## 2020-02-03 LAB — SARS CORONAVIRUS 2 (TAT 6-24 HRS): SARS Coronavirus 2: NEGATIVE

## 2020-02-03 MED ORDER — HEPARIN SODIUM (PORCINE) 5000 UNIT/ML IJ SOLN
5000.0000 [IU] | Freq: Three times a day (TID) | INTRAMUSCULAR | Status: DC
  Administered 2020-02-03 – 2020-02-04 (×2): 5000 [IU] via SUBCUTANEOUS
  Filled 2020-02-03 (×2): qty 1

## 2020-02-03 MED ORDER — GUAIFENESIN-DM 100-10 MG/5ML PO SYRP
5.0000 mL | ORAL_SOLUTION | ORAL | Status: DC | PRN
  Administered 2020-02-04: 5 mL via ORAL
  Filled 2020-02-03 (×2): qty 5

## 2020-02-03 MED ORDER — SALINE SPRAY 0.65 % NA SOLN
2.0000 | NASAL | Status: DC | PRN
  Administered 2020-02-03: 2 via NASAL
  Filled 2020-02-03: qty 44

## 2020-02-03 MED ORDER — TRAZODONE HCL 50 MG PO TABS
50.0000 mg | ORAL_TABLET | Freq: Every day | ORAL | Status: DC
  Administered 2020-02-03: 50 mg via ORAL
  Filled 2020-02-03: qty 1

## 2020-02-03 MED ORDER — POTASSIUM CHLORIDE CRYS ER 20 MEQ PO TBCR
20.0000 meq | EXTENDED_RELEASE_TABLET | Freq: Once | ORAL | Status: AC
  Administered 2020-02-03: 20 meq via ORAL
  Filled 2020-02-03: qty 1

## 2020-02-03 MED ORDER — ACETAMINOPHEN 325 MG PO TABS
650.0000 mg | ORAL_TABLET | Freq: Four times a day (QID) | ORAL | Status: DC | PRN
  Administered 2020-02-04: 650 mg via ORAL
  Filled 2020-02-03: qty 2

## 2020-02-03 MED ORDER — HYDRALAZINE HCL 25 MG PO TABS: 25.0000 mg | ORAL_TABLET | Freq: Four times a day (QID) | ORAL | Status: DC | PRN

## 2020-02-03 MED ORDER — ONDANSETRON 4 MG PO TBDP: 8.0000 mg | ORAL_TABLET | Freq: Three times a day (TID) | ORAL | Status: DC | PRN

## 2020-02-03 MED ORDER — PANTOPRAZOLE SODIUM 20 MG PO TBEC
20.0000 mg | DELAYED_RELEASE_TABLET | Freq: Every day | ORAL | Status: DC
  Administered 2020-02-03 – 2020-02-04 (×2): 20 mg via ORAL
  Filled 2020-02-03 (×3): qty 1

## 2020-02-03 MED ORDER — SODIUM CHLORIDE 0.9 % IV SOLN: INTRAVENOUS | Status: DC

## 2020-02-03 MED ORDER — ACETAMINOPHEN 650 MG RE SUPP: 650.0000 mg | Freq: Four times a day (QID) | RECTAL | Status: DC | PRN

## 2020-02-03 MED ORDER — DICYCLOMINE HCL 20 MG PO TABS
20.0000 mg | ORAL_TABLET | Freq: Four times a day (QID) | ORAL | Status: DC | PRN
  Administered 2020-02-03: 20 mg via ORAL
  Filled 2020-02-03: qty 1

## 2020-02-03 MED ORDER — SODIUM CHLORIDE 0.9% FLUSH: 3.0000 mL | Freq: Once | INTRAVENOUS | Status: DC

## 2020-02-03 MED ORDER — SODIUM CHLORIDE 0.9 % IV BOLUS
1000.0000 mL | Freq: Once | INTRAVENOUS | Status: AC
  Administered 2020-02-03: 1000 mL via INTRAVENOUS

## 2020-02-03 NOTE — ED Notes (Signed)
Pt is aware she needs to collect a urine sample.

## 2020-02-03 NOTE — ED Provider Notes (Signed)
[facility] EMERGENCY DEPARTMENT Provider Note   CSN: [id number] Arrival date & time: 02/03/20  1141     History Chief Complaint  Patient presents with  . Abnormal Lab    [name] is a 69 y.o. female.  Pt presents to the ED today with low sodium.  Pt went to her pcp's office yesterday due to frequent falls and lack of appetite.  Pt has lost 11 lbs since her last visit to her doctor's office.  She has been weak and has been shuffling her feet.  She has been staying with her brother b/c she has been so weak.  Pt said she feels hungry, but gets full as soon as she takes 1 or 2 bites.  Pt's sodium yesterday was 121, so her doctor told her to come to the ED.  Pt is on (Ziac) bisoprolol-hydrochlorothiazide 10-6.25 mg daily.    Pt has had 1 dose of her Covid vaccine.  The next is due on 4/4.        Past Medical History:  Diagnosis Date  . Abdominal pain, epigastric   . Acid reflux    Pt was born with acute acid  . CHF (congestive heart failure) ([place])   . GERD (gastroesophageal reflux disease)   . Heart murmur   . Hiatal hernia   . Hypertension   . Schatzki's ring   . Ulcer    8 years ago    Patient Active Problem List   Diagnosis Date Noted  . Prediabetes 03/23/2013  . Hyperlipidemia 03/23/2013  . Colon cancer screening 11/18/2012  . Hypertension 09/16/2012  . GERD (gastroesophageal reflux disease) 09/16/2012  . Tobacco use 09/16/2012    Past Surgical History:  Procedure Laterality Date  . ABDOMINAL HYSTERECTOMY     complete for fibroids  . KNEE ARTHROSCOPY     right knee  . shoulder repalcement     right  . TONSILLECTOMY AND ADENOIDECTOMY       OB History   No obstetric history on file.     Family History  Problem Relation Age of Onset  . Prostate cancer Father   . Anxiety disorder Mother   . Colon cancer Maternal Grandmother     Social History   Tobacco Use  . Smoking status: Current Every Day Smoker    Packs/day: 0.50     Types: Cigarettes  . Smokeless tobacco: Never Used  . Tobacco comment: almost a pack a day;   Substance Use Topics  . Alcohol use: No    Alcohol/week: 0.0 standard drinks  . Drug use: No    Home Medications Prior to Admission medications   Medication Sig Start Date End Date Taking? Authorizing Provider  bisoprolol-hydrochlorothiazide (ZIAC) 10-6.25 MG tablet Take 1 tablet by mouth daily. 01/27/20  Yes [provider]  dicyclomine (BENTYL) 20 MG tablet Take 20 mg by mouth every 6 (six) hours as needed. 01/25/20  Yes [provider]  lansoprazole (PREVACID) 15 MG capsule Take 30 mg by mouth 2 (two) times daily as needed (Acid reflux).   Yes [provider]  lisinopril (PRINIVIL,ZESTRIL) 40 MG tablet Take 1 tablet (40 mg total) by mouth daily. 09/13/14  Yes Colin Benton R, DO  ondansetron (ZOFRAN-ODT) 8 MG disintegrating tablet Take 8 mg by mouth every 8 (eight) hours as needed. 01/29/20  Yes [provider]  sodium chloride (OCEAN) 0.65 % SOLN nasal spray Place 2 sprays into both nostrils as needed for congestion.   Yes [provider]  traMADol (ULTRAM) 50 MG tablet Take 50 mg by mouth as needed.  08/11/14  Yes [provider]  traZODone (DESYREL) 50 MG tablet Take 50 mg by mouth at bedtime.   Yes [provider]  baclofen (LIORESAL) 10 MG tablet Take 1 tablet by mouth as needed. 03/26/19   [provider]  dexlansoprazole (DEXILANT) 60 MG capsule Take 1 capsule (60 mg total) by mouth daily. 30 minutes before breakfast 04/01/19   Pyrtle, Lajuan Lines, MD  diphenoxylate-atropine (LOMOTIL) 2.5-0.025 MG tablet Take 1 tablet by mouth 4 (four) times daily as needed for diarrhea or loose stools. 08/17/15   PyrtleLajuan Lines, MD    Allergies    Iodine and Shellfish allergy  Review of Systems   Review of Systems  Constitutional: Positive for appetite change, fatigue and unexpected weight change.  Neurological: Positive for weakness.  All  other systems reviewed and are negative.   Physical Exam Updated Vital Signs BP 133/72 (BP Location: Left Arm)   Pulse 60   Temp 97.6 F (36.4 C) (Oral)   Resp 11   Ht '5\' 2"'  (1.575 m)   Wt 41.7 kg   SpO2 98%   BMI 16.83 kg/m   Physical Exam Vitals and nursing note reviewed.  Constitutional:      Appearance: She is underweight.  HENT:     Head:     Comments: Multiple bruises to face    Right Ear: External ear normal.     Left Ear: External ear normal.     Mouth/Throat:     Mouth: Mucous membranes are moist.     Pharynx: Oropharynx is clear.  Eyes:     Extraocular Movements: Extraocular movements intact.     Conjunctiva/sclera: Conjunctivae normal.     Pupils: Pupils are equal, round, and reactive to light.  Cardiovascular:     Rate and Rhythm: Normal rate and regular rhythm.     Pulses: Normal pulses.     Heart sounds: Normal heart sounds.  Pulmonary:     Effort: Pulmonary effort is normal.     Breath sounds: Normal breath sounds.  Abdominal:     General: Abdomen is flat. Bowel sounds are normal.     Palpations: Abdomen is soft.  Musculoskeletal:        General: Normal range of motion.     Cervical back: Normal range of motion and neck supple.  Skin:    General: Skin is warm.     Capillary Refill: Capillary refill takes less than 2 seconds.  Neurological:     General: No focal deficit present.     Mental Status: She is alert and oriented to person, place, and time.  Psychiatric:        Mood and Affect: Mood normal.        Behavior: Behavior normal.        Thought Content: Thought content normal.        Judgment: Judgment normal.     ED Results / Procedures / Treatments   Labs (all labs ordered are listed, but only abnormal results are displayed) Labs Reviewed  BASIC METABOLIC PANEL - Abnormal; Notable for the following components:      Result Value   Sodium 123 (*)    Potassium 3.4 (*)    Chloride 80 (*)    Glucose, Bld 114 (*)    Creatinine, Ser  1.36 (*)    GFR calc non Af Amer 40 (*)    GFR  calc Af Amer 47 (*)    All other components within normal limits  CBC - Abnormal; Notable for the following components:   WBC 11.6 (*)    All other components within normal limits  CBG MONITORING, ED - Abnormal; Notable for the following components:   Glucose-Capillary 108 (*)    All other components within normal limits  SARS CORONAVIRUS 2 (TAT 6-24 HRS)  URINALYSIS, ROUTINE W REFLEX MICROSCOPIC  SODIUM, URINE, RANDOM   Labs from yesterday:   Vitamin D 25 Hydroxy (02/02/2020 2:04 PM EDT) Vitamin D 25 Hydroxy (02/02/2020 2:04 PM EDT)  Component Value Ref Range Performed At Pathologist Signature  Vit D, 25-Hydroxy 46.7 Comment:  Vitamin D deficiency has been defined by the [place] and an Endocrine Society practice guideline as a level of serum 25-OH vitamin D less than 20 ng/mL (1,2). The Endocrine Society went on to further define vitamin D insufficiency as a level between 21 and 29 ng/mL (2). 1. IOM (Institute of Medicine). 2010. Dietary reference  intakes for calcium and D. [place]: The  [employer]. 2. Holick MF, Binkley [place], Bischoff-Ferrari HA, et al.  Evaluation, treatment, and prevention of vitamin D  deficiency: an Endocrine Society clinical practice  guideline. JCEM. 2011 Jul; 96(7):1911-30.  30.0 - 100.0 ng/mL [facility] 1       CMP14+CBC/D/Plt+TSH (02/02/2020 2:04 PM EDT) CMP14+CBC/D/Plt+TSH (02/02/2020 2:04 PM EDT)  Component Value Ref Range Performed At Pathologist Signature  Glucose 110 (H) 65 - 99 mg/dL [facility]   BUN 22 8 - 27 mg/dL [facility]   Creatinine 1.44 (H) 0.57 - 1.00 mg/dL [facility]   eGFR If NonAfrican American 38 (L) >59 mL/min/1.73 [facility]   eGFR If African American 44 (L) >59 mL/min/1.73 [facility]   BUN/Creatinine Ratio 15 12 - 28 [facility]   Sodium 121 (LL)Comment:  Client Requested Flag 134 - 144 mmol/L [facility]   Potassium 3.6 3.5 - 5.2 mmol/L  [facility] 1   Chloride 76 (L) 96 - 106 mmol/L [facility]   CO2 29 20 - 29 mmol/L [facility]   CALCIUM 9.3 8.7 - 10.3 mg/dL [facility]   Total Protein 5.7 (L) 6.0 - 8.5 g/dL [facility]   Albumin, Serum 4.0 3.8 - 4.8 g/dL [facility]   Globulin, Total 1.7 1.5 - 4.5 g/dL [facility]   Albumin/Globulin Ratio 2.4 (H) 1.2 - 2.2 [facility]   Total Bilirubin 0.5 0.0 - 1.2 mg/dL [facility]   Alkaline Phosphatase 115 39 - 117 IU/L [facility]   AST 33 0 - 40 IU/L [facility]   ALT (SGPT) 31 0 - 32 IU/L [facility]   TSH 0.687 0.450 - 4.500 uIU/mL [facility]   WBC 13.0 (H) 3.4 - 10.8 x10E3/uL [facility]   RBC 4.40 3.77 - 5.28 x10E6/uL [facility]   Hemoglobin 13.7 11.1 - 15.9 g/dL [facility]   Hematocrit 39.5 34.0 - 46.6 % [facility]   MCV 90 79 - 97 fL [facility]   MCH 31.1 26.6 - 33.0 pg [facility]   MCHC 34.7 31.5 - 35.7 g/dL [facility]   RDW 13.6 11.7 - 15.4 % [facility]   Platelet Count 385 150 - 450 x10E3/uL [facility]   Neutrophils 68 Not Estab. % [facility]   Lymphs Relative  18 Not Estab. % [facility]   Monocytes 12 Not Estab. % [facility]   Eos Relative  0 Not Estab. %  [facility]   Basos Relative  1 Not Estab. % [facility]   Neutrophils Absolute 9.0 (H) 1.4 - 7.0 x10E3/uL [facility]   Lymphocytes Absolute 2.3 0.7 - 3.1 x10E3/uL [facility]   Monocytes Absolute 1.5 (H) 0.1 - 0.9 x10E3/uL [facility]   Eosinophils Absolute 0.0 0.0 - 0.4 x10E3/uL [facility]   Basophils Absolute 0.1 0.0 - 0.2 x10E3/uL [facility]   Immature Granulocytes 1 Not Estab. % [facility]   Immature Grans (Abs) 0.1 0.0 - 0.1 x10E3/uL [facility] 1     EKG EKG Interpretation  Date/Time:  Wednesday February 03 2020 13:50:01 EDT Ventricular Rate:  61 PR Interval:  168 QRS Duration: 86 QT Interval:  452 QTC Calculation: 456 R Axis:   72 Text Interpretation: Sinus rhythm Sinus pause Nonspecific T abnormalities, anterior leads No significant change since last tracing Confirmed by Isla Pence [phone]) on 02/03/2020 1:59:19  PM   Radiology CT Head Wo Contrast  Result Date: 02/03/2020 CLINICAL DATA:  Headache, posttraumatic. Additional history provided: Hyponatremia, several instances of syncope, dizziness EXAM: CT HEAD WITHOUT CONTRAST TECHNIQUE: Contiguous axial images were obtained from the base of the skull through the vertex without intravenous contrast. COMPARISON:  Head CT 05/25/2011 FINDINGS: Brain: There is no evidence of acute intracranial hemorrhage, intracranial mass, midline shift or extra-axial fluid collection.No demarcated cortical infarction. Mild ill-defined hypoattenuation within the cerebral white matter is nonspecific, but consistent with chronic small vessel ischemic disease. Mild generalized parenchymal atrophy. Vascular: No hyperdense vessel.  Atherosclerotic calcifications Skull: Normal. Negative for fracture or focal lesion. Sinuses/Orbits: Visualized orbits demonstrate no acute abnormality. No significant paranasal sinus disease. Trace fluid within right mastoid air cells IMPRESSION: No evidence of acute intracranial abnormality. Mild generalized parenchymal atrophy and chronic small vessel ischemic disease. Trace right mastoid effusion. Electronically Signed   By: Kellie Simmering DO   On: 02/03/2020 14:23   XR Chest Pa And Lateral (02/02/2020 2:47 PM EDT) XR Chest Pa And Lateral (02/02/2020 2:47 PM EDT)  Specimen     XR Chest Pa And Lateral (02/02/2020 2:47 PM EDT)  Impressions Performed At  IMPRESSION:    No acute disease in the chest. No significant change compared to the prior examination.    Electronically Signed by: Scot Jun  [phone]    XR Chest Pa And Lateral (02/02/2020 2:47 PM EDT)  Narrative Performed At  CLINICAL HISTORY: Essential (primary) hypertension    COMPARISON: 11/01/2016.    TECHNIQUE: PA and lateral views of the chest. 02/02/2020 2:36 PM    FINDINGS.    Heart size is within normal limits. Large hiatal hernia noted..    The lungs are clear  of active infiltrates. Areas of linear atelectasis or scar noted in the right lung base.Marland Kitchen    No pleural effusion or pneumothorax.    No acute bony abnormality. Right shoulder prosthesis unchanged        Procedures Procedures (including critical care time)  Medications Ordered in ED Medications  sodium chloride flush (NS) 0.9 % injection 3 mL (has no administration in time range)  sodium chloride 0.9 % bolus 1,000 mL (has no administration in time range)    And  0.9 %  sodium chloride infusion (has no administration in time range)    ED Course  I have reviewed the triage vital signs and the nursing notes.  Pertinent labs & imaging results that were available during my care of the patient were reviewed by me and considered in my medical decision  making (see chart for details).    MDM Rules/Calculators/A&P                       Labs and CT head reviewed.  Low sodium is likely from HCTZ.  Pt given IVFs in ED.  UA and urine sodium pending.  Pt will require admission due to the hyponatremia.  She was d/w Dr. Roosevelt Locks ([facility]) who will admit.  Final Clinical Impression(s) / ED Diagnoses Final diagnoses:  Hyponatremia  Ambulatory dysfunction    Rx / DC Orders ED Discharge Orders    None       Isla Pence, MD 02/03/20 [phone]

## 2020-02-03 NOTE — ED Triage Notes (Signed)
Pt arrives via pov after getting a call from pcp with critical low sodium. Pt reports feeling generally weak with several instances of syncope.

## 2020-02-03 NOTE — ED Notes (Signed)
CBG Results of 108 reported to [place], Therapist, sports.

## 2020-02-03 NOTE — H&P (Signed)
History and Physical    [name] [id number] DOB: [birth year] DOA: 02/03/2020  PCP: Vicenta Aly, McKe[place]   Patient coming from: Home  I have personally briefly reviewed patient's old medical records in [facility]  Chief Complaint: Feeling dizzy, falling a lot  HPI: [name] is a 69 y.o. female with medical history significant of HTN, GERD presented with feeling lightheaded, frequent falls, and weight loss for 2 weeks.  She has been feeling lightheaded for some time, associated with standing up or sitting up.  But her symptoms getting worse in the last 2 weeks, she feared very much of standing up which made her very wobbly and fell couple of times.  Denied any occasions of losing consciousness, but she did hit her head a couple times bruising bruises on her face.  Meanwhile, she also feels very tired and her appetite has been decreasing, but did denied any abdominal pain, no nauseous/vomiting or diarrhea.  She denied any chest pain [place] no fever chills.  She went to her PCP on Monday, did blood work which she received a call today saying her sodium level was critically low and sent her to the ED. ED Course: Sodium level 123, CT had negative for any intracranial bleeding or structural changes AKI and Hypokalemia Review of Systems: As per HPI otherwise 10 point review of systems negative.    Past Medical History:  Diagnosis Date  . Abdominal pain, epigastric   . Acid reflux    Pt was born with acute acid  . CHF (congestive heart failure) ([place])   . GERD (gastroesophageal reflux disease)   . Heart murmur   . Hiatal hernia   . Hypertension   . Schatzki's ring   . Ulcer    8 years ago    Past Surgical History:  Procedure Laterality Date  . ABDOMINAL HYSTERECTOMY     complete for fibroids  . KNEE ARTHROSCOPY     right knee  . shoulder repalcement     right  . TONSILLECTOMY AND ADENOIDECTOMY       reports that she has been smoking cigarettes. She has been  smoking about 0.50 packs per day. She has never used smokeless tobacco. She reports that she does not drink alcohol or use drugs.  Allergies  Allergen Reactions  . Iodine Shortness Of Breath and Rash  . Shellfish Allergy     Causes rash    Family History  Problem Relation Age of Onset  . Prostate cancer Father   . Anxiety disorder Mother   . Colon cancer Maternal Grandmother     Prior to Admission medications   Medication Sig Start Date End Date Taking? Authorizing Provider  bisoprolol-hydrochlorothiazide (ZIAC) 10-6.25 MG tablet Take 1 tablet by mouth daily. 01/27/20  Yes [provider]  dicyclomine (BENTYL) 20 MG tablet Take 20 mg by mouth every 6 (six) hours as needed for spasms.  01/25/20  Yes [provider]  lansoprazole (PREVACID) 15 MG capsule Take 30 mg by mouth 2 (two) times daily as needed (Acid reflux).   Yes [provider]  lisinopril (PRINIVIL,ZESTRIL) 40 MG tablet Take 1 tablet (40 mg total) by mouth daily. 09/13/14  Yes Colin Benton R, DO  ondansetron (ZOFRAN-ODT) 8 MG disintegrating tablet Take 8 mg by mouth every 8 (eight) hours as needed for nausea or vomiting.  01/29/20  Yes [provider]  sodium chloride (OCEAN) 0.65 % SOLN nasal spray Place 2 sprays into both nostrils as needed for  congestion.   Yes [provider]  traMADol (ULTRAM) 50 MG tablet Take 50 mg by mouth every 6 (six) hours as needed for moderate pain.  08/11/14  Yes [provider]  traZODone (DESYREL) 50 MG tablet Take 50 mg by mouth at bedtime.   Yes [provider]  baclofen (LIORESAL) 10 MG tablet Take 10 mg by mouth 4 (four) times daily as needed for muscle spasms.  03/26/19   [provider]  dexlansoprazole (DEXILANT) 60 MG capsule Take 1 capsule (60 mg total) by mouth daily. 30 minutes before breakfast Patient not taking: Reported on 02/03/2020 04/01/19   Crystal Bears, MD  diphenoxylate-atropine (LOMOTIL) 2.5-0.025 MG tablet  Take 1 tablet by mouth 4 (four) times daily as needed for diarrhea or loose stools. Patient not taking: Reported on 02/03/2020 08/17/15   Crystal Bears, MD    Physical Exam: Vitals:   02/03/20 1145 02/03/20 1350 02/03/20 1351 02/03/20 1502  BP: 108/71 133/72 133/72 124/81  Pulse: 72 60 60 (!) 58  Resp: 16 (!) 9 11 13   Temp: 98.2 F (36.8 C)  97.6 F (36.4 C)   TempSrc: Oral  Oral   SpO2: 96% 100% 98% 98%  Weight: 41.7 kg     Height: 5\' 2"  (1.575 m)       Constitutional: NAD, calm, comfortable Vitals:   02/03/20 1145 02/03/20 1350 02/03/20 1351 02/03/20 1502  BP: 108/71 133/72 133/72 124/81  Pulse: 72 60 60 (!) 58  Resp: 16 (!) 9 11 13   Temp: 98.2 F (36.8 C)  97.6 F (36.4 C)   TempSrc: Oral  Oral   SpO2: 96% 100% 98% 98%  Weight: 41.7 kg     Height: 5\' 2"  (1.575 m)      Eyes: PERRL, lids and conjunctivae normal ENMT: Mucous membranes are moist. Posterior pharynx clear of any exudate or lesions.Normal dentition.  Neck: normal, supple, no masses, no thyromegaly Respiratory: clear to auscultation bilaterally, no wheezing, no crackles. Normal respiratory effort. No accessory muscle use.  Cardiovascular: Regular rate and rhythm, soft systolic murmur on heart base. No extremity edema. 2+ pedal pulses. No carotid bruits.  Abdomen: no tenderness, no masses palpated. No hepatosplenomegaly. Bowel sounds positive.  Musculoskeletal: no clubbing / cyanosis. No joint deformity upper and lower extremities. Good ROM, no contractures. Normal muscle tone.  Skin: no rashes, lesions, ulcers. No induration Neurologic: CN 2-12 grossly intact. Sensation intact, DTR normal. Strength 5/5 in all 4.  Psychiatric: Normal judgment and insight. Alert and oriented x 3. Normal mood.     Labs on Admission: I have personally reviewed following labs and imaging studies  CBC: Recent Labs  Lab 02/03/20 1209  WBC 11.6*  HGB 13.9  HCT 39.1  MCV 86.3  PLT [id number]   Basic Metabolic Panel: Recent Labs    Lab 02/03/20 1209  NA 123*  K 3.4*  CL 80*  CO2 28  GLUCOSE 114*  BUN 22  CREATININE 1.36*  CALCIUM 9.0   GFR: Estimated Creatinine Clearance: 26.8 mL/min (A) (by C-G formula based on SCr of 1.36 mg/dL (H)). Liver Function Tests: No results for input(s): AST, ALT, ALKPHOS, BILITOT, PROT, ALBUMIN in the last 168 hours. No results for input(s): LIPASE, AMYLASE in the last 168 hours. No results for input(s): AMMONIA in the last 168 hours. Coagulation Profile: No results for input(s): INR, PROTIME in the last 168 hours. Cardiac Enzymes: No results for input(s): CKTOTAL, CKMB, CKMBINDEX, TROPONINI in the last 168 hours. BNP (last  3 results) No results for input(s): PROBNP in the last 8760 hours. HbA1C: No results for input(s): HGBA1C in the last 72 hours. CBG: Recent Labs  Lab 02/03/20 1344  GLUCAP 108*   Lipid Profile: No results for input(s): CHOL, HDL, LDLCALC, TRIG, CHOLHDL, LDLDIRECT in the last 72 hours. Thyroid Function Tests: No results for input(s): TSH, T4TOTAL, FREET4, T3FREE, THYROIDAB in the last 72 hours. Anemia Panel: No results for input(s): VITAMINB12, FOLATE, FERRITIN, TIBC, IRON, RETICCTPCT in the last 72 hours. Urine analysis:    Component Value Date/Time   COLORURINE YELLOW 05/25/2011 [place] 05/25/2011 1337   LABSPEC 1.011 05/25/2011 1337   PHURINE 6.0 05/25/2011 1337   GLUCOSEU NEGATIVE 05/25/2011 1337   HGBUR NEGATIVE 05/25/2011 1337   BILIRUBINUR NEGATIVE 05/25/2011 1337   KETONESUR NEGATIVE 05/25/2011 1337   PROTEINUR NEGATIVE 05/25/2011 1337   UROBILINOGEN 0.2 05/25/2011 1337   NITRITE NEGATIVE 05/25/2011 [place] 05/25/2011 1337    Radiological Exams on Admission: CT Head Wo Contrast  Result Date: 02/03/2020 CLINICAL DATA:  Headache, posttraumatic. Additional history provided: Hyponatremia, several instances of syncope, dizziness EXAM: CT HEAD WITHOUT CONTRAST TECHNIQUE: Contiguous axial images  were obtained from the base of the skull through the vertex without intravenous contrast. COMPARISON:  Head CT 05/25/2011 FINDINGS: Brain: There is no evidence of acute intracranial hemorrhage, intracranial mass, midline shift or extra-axial fluid collection.No demarcated cortical infarction. Mild ill-defined hypoattenuation within the cerebral white matter is nonspecific, but consistent with chronic small vessel ischemic disease. Mild generalized parenchymal atrophy. Vascular: No hyperdense vessel.  Atherosclerotic calcifications Skull: Normal. Negative for fracture or focal lesion. Sinuses/Orbits: Visualized orbits demonstrate no acute abnormality. No significant paranasal sinus disease. Trace fluid within right mastoid air cells IMPRESSION: No evidence of acute intracranial abnormality. Mild generalized parenchymal atrophy and chronic small vessel ischemic disease. Trace right mastoid effusion. Electronically Signed   By: Kellie Simmering DO   On: 02/03/2020 14:23    EKG: Independently reviewed.  Sinus rhythm, no ST changes  Assessment/Plan Active Problems:   Hyponatremia  Hyponatremia/frequent falls Likely related to hydrochlorothiazide, will discontinue hydrochlorothiazide, repeat sodium level tonight and tomorrow morning, void abrupt correction.  AKI Likely from side effect of high blood pressure meds.  Continue hydrochlorothiazide, hold lisinopril As needed hydralazine for now 0.9 normal saline running at 100 for tonight, repeat BMP tomorrow morning.  Unintentional weight loss Pain related to acute hyponatremia, as she denied any GI symptoms, is a chronic smoker, will order chest x-ray, recommend she follow-up with her GI for elective endoscopy and colonoscopy.  GERD On PPI   DVT prophylaxis: Heparin subcu Code Status: Full code Family Communication: None at bedside Disposition Plan: Likely can be discharged home tomorrow after sodium level stabilized. Consults called: None Admission  status: Telemetry observation   Lequita Halt MD [facility] Pager [phone]    02/03/2020, 3:51 PM

## 2020-02-04 ENCOUNTER — Encounter (HOSPITAL_COMMUNITY): Payer: Self-pay | Admitting: Internal Medicine

## 2020-02-04 ENCOUNTER — Other Ambulatory Visit: Payer: Self-pay

## 2020-02-04 DIAGNOSIS — E871 Hypo-osmolality and hyponatremia: Secondary | ICD-10-CM

## 2020-02-04 LAB — BASIC METABOLIC PANEL
Anion gap: 8 (ref 5–15)
BUN: 17 mg/dL (ref 8–23)
CO2: 29 mmol/L (ref 22–32)
Calcium: 8.2 mg/dL — ABNORMAL LOW (ref 8.9–10.3)
Chloride: 94 mmol/L — ABNORMAL LOW (ref 98–111)
Creatinine, Ser: 1.14 mg/dL — ABNORMAL HIGH (ref 0.44–1.00)
GFR calc Af Amer: 58 mL/min — ABNORMAL LOW (ref >60)
GFR calc non Af Amer: 50 mL/min — ABNORMAL LOW (ref >60)
Glucose, Bld: 122 mg/dL — ABNORMAL HIGH (ref 70–99)
Potassium: 3.3 mmol/L — ABNORMAL LOW (ref 3.5–5.1)
Sodium: 131 mmol/L — ABNORMAL LOW (ref 135–145)

## 2020-02-04 LAB — GLUCOSE, CAPILLARY: Glucose-Capillary: 91 mg/dL (ref 70–99)

## 2020-02-04 LAB — MAGNESIUM: Magnesium: 1.4 mg/dL — ABNORMAL LOW (ref 1.7–2.4)

## 2020-02-04 MED ORDER — POTASSIUM CHLORIDE CRYS ER 20 MEQ PO TBCR
40.0000 meq | EXTENDED_RELEASE_TABLET | Freq: Once | ORAL | Status: AC
  Administered 2020-02-04: 40 meq via ORAL
  Filled 2020-02-04: qty 2

## 2020-02-04 MED ORDER — MAGNESIUM SULFATE 2 GM/50ML IV SOLN
2.0000 g | Freq: Once | INTRAVENOUS | Status: AC
  Administered 2020-02-04: 2 g via INTRAVENOUS
  Filled 2020-02-04: qty 50

## 2020-02-04 MED ORDER — KETOROLAC TROMETHAMINE 30 MG/ML IJ SOLN
30.0000 mg | Freq: Once | INTRAMUSCULAR | Status: AC
  Administered 2020-02-04: 30 mg via INTRAVENOUS
  Filled 2020-02-04: qty 1

## 2020-02-04 NOTE — Progress Notes (Signed)
Patient ambulated in the hall no problems

## 2020-02-04 NOTE — Progress Notes (Signed)
Haig Prophet to be D/C'd Home per MD order.  Discussed with the patient and all questions fully answered.   VSS, Skin clean, dry and intact without evidence of skin break down, no evidence of skin tears noted. IV catheter discontinued intact. Site without signs and symptoms of complications. Dressing and pressure applied.   An After Visit Summary was printed and given to the patient.    D/C education completed with patient/family including follow up instructions, medication list, d/c activities limitations if indicated, with other d/c instructions as indicated by MD - patient able to verbalize understanding, all questions fully answered.    Patient instructed to return to ED, call 911, or call MD for any changes in condition.    Patient escorted via [place], and D/C home via private car.

## 2020-02-04 NOTE — Discharge Instructions (Signed)
Hyponatremia Hyponatremia is when the amount of salt (sodium) in your blood is too low. When salt levels are low, your body may take in extra water. This can cause swelling throughout the body. The swelling often affects the brain. What are the causes? This condition may be caused by:  Certain medical problems or conditions.  Vomiting a lot.  Having watery poop (diarrhea) often.  Certain medicines or illegal drugs.  Not having enough water in the body (dehydration).  Drinking too much water.  Eating a diet that is low in salt.  Large burns on your body.  Too much sweating. What increases the risk? You are more likely to get this condition if you:  Have long-term (chronic) kidney disease.  Have heart failure.  Have a medical condition that causes you to have watery poop often.  Do very hard exercises.  Take medicines that affect the amount of salt is in your blood. What are the signs or symptoms? Symptoms of this condition include:  Headache.  Feeling like you may vomit (nausea).  Vomiting.  Being very tired (lethargic).  Muscle weakness and cramps.  Not wanting to eat as much as normal (loss of appetite).  Feeling weak or light-headed. Severe symptoms of this condition include:  Confusion.  Feeling restless (agitation).  Having a fast heart rate.  Passing out (fainting).  Seizures.  Coma. How is this treated? Treatment for this condition depends on the cause. Treatment may include:  Getting fluids through an IV tube that is put into one of your veins.  Taking medicines to fix the salt levels in your blood. If medicines are causing the problem, your medicines will need to be changed.  Limiting how much water or fluid you take in.  Monitoring in the hospital to watch your symptoms. Follow these instructions at home:   Take over-the-counter and prescription medicines only as told by your doctor. Many medicines can make this condition worse.  Talk with your doctor about any medicines that you are taking.  Eat and drink exactly as you are told by your doctor. ? Eat only the foods you are told to eat. ? Limit how much fluid you take.  Do not drink alcohol.  Keep all follow-up visits as told by your doctor. This is important. Contact a doctor if:  You feel more like you may vomit.  You feel more tired.  Your headache gets worse.  You feel more confused.  You feel weaker.  Your symptoms go away and then they come back.  You have trouble following the diet instructions. Get help right away if:  You have a seizure.  You pass out.  You keep having watery poop.  You keep vomiting. Summary  Hyponatremia is when the amount of salt in your blood is too low.  When salt levels are low, you can have swelling throughout the body. The swelling mostly affects the brain.  Treatment depends on the cause. Treatment may include getting IV fluids, medicines, or not drinking as much fluid. This information is not intended to replace advice given to you by your health care provider. Make sure you discuss any questions you have with your health care provider. Document Revised: 01/15/2019 Document Reviewed: 10/02/2018 Elsevier Patient Education  2020 Elsevier Inc.  

## 2020-02-04 NOTE — Discharge Summary (Signed)
Physician Discharge Summary  [name] [id number] DOB: [birth year] DOA: 02/03/2020  PCP: Vicenta Aly, FNP  Admit date: 02/03/2020 Discharge date: 02/04/2020  Admitted From: Home Discharge disposition: Home   Recommendations for Outpatient Follow-Up:   Limit alcohol consumption Patient's blood pressure lower end of normal so we will hold blood pressure medications.  Would not restart hydrochlorothiazide as this most likely caused her low potassium and magnesium  Discharge Diagnosis:   Active Problems:   Hyponatremia    Discharge Condition: Improved.  Diet recommendation: Low sodium, heart healthy  Wound care: None.  Code status: Full.   History of Present Illness:   HPI: [name] is a 69 y.o. female with medical history significant of HTN, GERD presented with feeling lightheaded, frequent falls, and weight loss for 2 weeks.  She has been feeling lightheaded for some time, associated with standing up or sitting up.  But her symptoms getting worse in the last 2 weeks, she feared very much of standing up which made her very wobbly and fell couple of times.  Denied any occasions of losing consciousness, but she did hit her head a couple times bruising bruises on her face.  Meanwhile, she also feels very tired and her appetite has been decreasing, but did denied any abdominal pain, no nauseous/vomiting or diarrhea.  She denied any chest pain Kober no fever chills.  She went to her PCP on Monday, did blood work which she received a call today saying her sodium level was critically low and sent her to the ED. ED Course: Sodium level 123, CT had negative for any intracranial bleeding or structural changes AKI and Hypokalemia   Hospital Course by Problem:   Hyponatremia/frequent falls Likely related to hydrochlorothiazide -BMP 1 week -Patient's baseline sodium appears to be 133 -Patient has been ambulating well here in the hospital with no further  issue  AKI Likely from side effect of high blood pressure meds and poor p.o. intake.   -DC hydrochlorothiazide and other blood pressure medicines as blood pressure on the lower end of normal  Unintentional weight loss -Outpatient work-up -Encourage patient to limit alcohol consumption  GERD On PPI  Hypokalemia/hypomagnesemia -replaced   Medical Consultants:      Discharge Exam:   Vitals:   02/04/20 0547 02/04/20 1021  BP: 98/63 (!) 121/53  Pulse: 66 62  Resp: 18 18  Temp: 98.4 F (36.9 C) 98 F (36.7 C)  SpO2: 98% 98%   Vitals:   02/03/20 1816 02/03/20 2348 02/04/20 0547 02/04/20 1021  BP:  99/65 98/63 (!) 121/53  Pulse: 74 66 66 62  Resp:  17 18 18   Temp:  98.2 F (36.8 C) 98.4 F (36.9 C) 98 F (36.7 C)  TempSrc:  Oral Oral Oral  SpO2:  94% 98% 98%  Weight:      Height:        General exam: Appears calm and comfortable.   The results of significant diagnostics from this hospitalization (including imaging, microbiology, ancillary and laboratory) are listed below for reference.     Procedures and Diagnostic Studies:   CT Head Wo Contrast  Result Date: 02/03/2020 CLINICAL DATA:  Headache, posttraumatic. Additional history provided: Hyponatremia, several instances of syncope, dizziness EXAM: CT HEAD WITHOUT CONTRAST TECHNIQUE: Contiguous axial images were obtained from the base of the skull through the vertex without intravenous contrast. COMPARISON:  Head CT 05/25/2011 FINDINGS: Brain: There is no evidence of acute intracranial hemorrhage, intracranial mass, midline shift  or extra-axial fluid collection.No demarcated cortical infarction. Mild ill-defined hypoattenuation within the cerebral white matter is nonspecific, but consistent with chronic small vessel ischemic disease. Mild generalized parenchymal atrophy. Vascular: No hyperdense vessel.  Atherosclerotic calcifications Skull: Normal. Negative for fracture or focal lesion. Sinuses/Orbits: Visualized  orbits demonstrate no acute abnormality. No significant paranasal sinus disease. Trace fluid within right mastoid air cells IMPRESSION: No evidence of acute intracranial abnormality. Mild generalized parenchymal atrophy and chronic small vessel ischemic disease. Trace right mastoid effusion. Electronically Signed   By: Kellie Simmering DO   On: 02/03/2020 14:23     Labs:   Basic Metabolic Panel: Recent Labs  Lab 02/03/20 1209 02/03/20 1209 02/03/20 2038 02/04/20 0526  NA 123*  --  127* 131*  K 3.4*   < > 3.3* 3.3*  CL 80*  --  90* 94*  CO2 28  --  26 29  GLUCOSE 114*  --  222* 122*  BUN 22  --  20 17  CREATININE 1.36*  --  1.50* 1.14*  CALCIUM 9.0  --  8.3* 8.2*  MG  --   --   --  1.4*   < > = values in this interval not displayed.   GFR Estimated Creatinine Clearance: 32 mL/min (A) (by C-G formula based on SCr of 1.14 mg/dL (H)). Liver Function Tests: No results for input(s): AST, ALT, ALKPHOS, BILITOT, PROT, ALBUMIN in the last 168 hours. No results for input(s): LIPASE, AMYLASE in the last 168 hours. No results for input(s): AMMONIA in the last 168 hours. Coagulation profile No results for input(s): INR, PROTIME in the last 168 hours.  CBC: Recent Labs  Lab 02/03/20 1209  WBC 11.6*  HGB 13.9  HCT 39.1  MCV 86.3  PLT 377   Cardiac Enzymes: No results for input(s): CKTOTAL, CKMB, CKMBINDEX, TROPONINI in the last 168 hours. BNP: Invalid input(s): POCBNP CBG: Recent Labs  Lab 02/03/20 1344 02/04/20 1120  GLUCAP 108* 91   D-Dimer No results for input(s): DDIMER in the last 72 hours. Hgb A1c No results for input(s): HGBA1C in the last 72 hours. Lipid Profile No results for input(s): CHOL, HDL, LDLCALC, TRIG, CHOLHDL, LDLDIRECT in the last 72 hours. Thyroid function studies No results for input(s): TSH, T4TOTAL, T3FREE, THYROIDAB in the last 72 hours.  Invalid input(s): FREET3 Anemia work up No results for input(s): VITAMINB12, FOLATE, FERRITIN, TIBC, IRON,  RETICCTPCT in the last 72 hours. Microbiology Recent Results (from the past 240 hour(s))  SARS CORONAVIRUS 2 (TAT 6-24 HRS) Nasopharyngeal Nasopharyngeal Swab     Status: None   Collection Time: 02/03/20  3:54 PM   Specimen: Nasopharyngeal Swab  Result Value Ref Range Status   SARS Coronavirus 2 NEGATIVE NEGATIVE Final    Comment: (NOTE) SARS-CoV-2 target nucleic acids are NOT DETECTED. The SARS-CoV-2 RNA is generally detectable in upper and lower respiratory specimens during the acute phase of infection. Negative results do not preclude SARS-CoV-2 infection, do not rule out co-infections with other pathogens, and should not be used as the sole basis for treatment or other patient management decisions. Negative results must be combined with clinical observations, patient history, and epidemiological information. The expected result is Negative. Fact Sheet for Patients: SugarRoll.be Fact Sheet for Healthcare Providers: https://www.woods-mathews.com/ This test is not yet approved or cleared by the [place] FDA and  has been authorized for detection and/or diagnosis of SARS-CoV-2 by FDA under an Emergency Use Authorization (EUA). This EUA will remain  in effect (meaning this  test can be used) for the duration of the COVID-19 declaration under Section 56 4(b)(1) of the Act, 21 U.S.C. section 360bbb-3(b)(1), unless the authorization is terminated or revoked sooner. Performed at [facility], [place] [address] [place], [place] [address]      Discharge Instructions:   Discharge Instructions    Diet - low sodium heart healthy   Complete by: As directed    Increase activity slowly   Complete by: As directed      Allergies as of 02/04/2020      Reactions   Iodine Shortness Of Breath, Rash   Shellfish Allergy    Causes rash      Medication List    STOP taking these medications   baclofen 10 MG tablet Commonly known as:  LIORESAL   bisoprolol-hydrochlorothiazide 10-6.25 MG tablet Commonly known as: ZIAC   dexlansoprazole 60 MG capsule Commonly known as: Dexilant   lisinopril 40 MG tablet Commonly known as: ZESTRIL     TAKE these medications   dicyclomine 20 MG tablet Commonly known as: BENTYL Take 20 mg by mouth every 6 (six) hours as needed for spasms.   diphenoxylate-atropine 2.5-0.025 MG tablet Commonly known as: Lomotil Take 1 tablet by mouth 4 (four) times daily as needed for diarrhea or loose stools.   lansoprazole 15 MG capsule Commonly known as: PREVACID Take 30 mg by mouth 2 (two) times daily as needed (Acid reflux).   ondansetron 8 MG disintegrating tablet Commonly known as: ZOFRAN-ODT Take 8 mg by mouth every 8 (eight) hours as needed for nausea or vomiting.   sodium chloride 0.65 % Soln nasal spray Commonly known as: OCEAN Place 2 sprays into both nostrils as needed for congestion.   traMADol 50 MG tablet Commonly known as: ULTRAM Take 50 mg by mouth every 6 (six) hours as needed for moderate pain.   traZODone 50 MG tablet Commonly known as: DESYREL Take 50 mg by mouth at bedtime.      Follow-up Information    Vicenta Aly, FNP Follow up in 1 week(s).   Specialty: Nurse Practitioner Why: BP check and BMP Contact information: [address] [place] [id number]            Time coordinating discharge: 35 min  Signed:  Geradine Girt DO  [facility] 02/04/2020, 12:40 PM

## 2020-02-09 ENCOUNTER — Encounter (HOSPITAL_COMMUNITY): Payer: Self-pay | Admitting: Emergency Medicine

## 2020-02-09 ENCOUNTER — Inpatient Hospital Stay (HOSPITAL_COMMUNITY)
Admission: EM | Admit: 2020-02-09 | Discharge: 2020-02-11 | DRG: 641 | Disposition: A | Discharge status: Home / Self Care | Payer: Medicare Other | Attending: Student | Admitting: Student

## 2020-02-09 ENCOUNTER — Other Ambulatory Visit: Payer: Self-pay

## 2020-02-09 DIAGNOSIS — E871 Hypo-osmolality and hyponatremia: Secondary | ICD-10-CM | POA: Diagnosis not present

## 2020-02-09 DIAGNOSIS — E860 Dehydration: Secondary | ICD-10-CM | POA: Diagnosis present

## 2020-02-09 DIAGNOSIS — K7030 Alcoholic cirrhosis of liver without ascites: Secondary | ICD-10-CM | POA: Diagnosis present

## 2020-02-09 DIAGNOSIS — I10 Essential (primary) hypertension: Secondary | ICD-10-CM | POA: Diagnosis present

## 2020-02-09 DIAGNOSIS — Z888 Allergy status to other drugs, medicaments and biological substances status: Secondary | ICD-10-CM

## 2020-02-09 DIAGNOSIS — I509 Heart failure, unspecified: Secondary | ICD-10-CM | POA: Diagnosis present

## 2020-02-09 DIAGNOSIS — F1010 Alcohol abuse, uncomplicated: Secondary | ICD-10-CM | POA: Diagnosis present

## 2020-02-09 DIAGNOSIS — J439 Emphysema, unspecified: Secondary | ICD-10-CM | POA: Diagnosis present

## 2020-02-09 DIAGNOSIS — Z79899 Other long term (current) drug therapy: Secondary | ICD-10-CM

## 2020-02-09 DIAGNOSIS — I110 Hypertensive heart disease with heart failure: Secondary | ICD-10-CM | POA: Diagnosis present

## 2020-02-09 DIAGNOSIS — F17210 Nicotine dependence, cigarettes, uncomplicated: Secondary | ICD-10-CM | POA: Diagnosis present

## 2020-02-09 DIAGNOSIS — Z91013 Allergy to seafood: Secondary | ICD-10-CM

## 2020-02-09 DIAGNOSIS — K219 Gastro-esophageal reflux disease without esophagitis: Secondary | ICD-10-CM | POA: Diagnosis present

## 2020-02-09 DIAGNOSIS — Z20822 Contact with and (suspected) exposure to covid-19: Secondary | ICD-10-CM | POA: Diagnosis present

## 2020-02-09 LAB — BASIC METABOLIC PANEL
Anion gap: 13 (ref 5–15)
BUN: 14 mg/dL (ref 8–23)
CO2: 27 mmol/L (ref 22–32)
Calcium: 8.8 mg/dL — ABNORMAL LOW (ref 8.9–10.3)
Chloride: 81 mmol/L — ABNORMAL LOW (ref 98–111)
Creatinine, Ser: 0.95 mg/dL (ref 0.44–1.00)
GFR calc Af Amer: >60 mL/min (ref >60)
GFR calc non Af Amer: >60 mL/min (ref >60)
Glucose, Bld: 101 mg/dL — ABNORMAL HIGH (ref 70–99)
Potassium: 3.8 mmol/L (ref 3.5–5.1)
Sodium: 121 mmol/L — ABNORMAL LOW (ref 135–145)

## 2020-02-09 LAB — CBC
HCT: 33.6 % — ABNORMAL LOW (ref 36.0–46.0)
Hemoglobin: 11.6 g/dL — ABNORMAL LOW (ref 12.0–15.0)
MCH: 30.8 pg (ref 26.0–34.0)
MCHC: 34.5 g/dL (ref 30.0–36.0)
MCV: 89.1 fL (ref 80.0–100.0)
Platelets: 411 10*3/uL — ABNORMAL HIGH (ref 150–400)
RBC: 3.77 MIL/uL — ABNORMAL LOW (ref 3.87–5.11)
RDW: 13.9 % (ref 11.5–15.5)
WBC: 12.2 10*3/uL — ABNORMAL HIGH (ref 4.0–10.5)
nRBC: 0 % (ref 0.0–0.2)

## 2020-02-09 NOTE — ED Notes (Signed)
Pt stated they were stepping outside  

## 2020-02-09 NOTE — ED Triage Notes (Signed)
Pt reports having labs drawn today and was told her sodium was critically low. Pain in lower back.

## 2020-02-10 ENCOUNTER — Encounter (HOSPITAL_COMMUNITY): Payer: Self-pay | Admitting: Emergency Medicine

## 2020-02-10 ENCOUNTER — Observation Stay (HOSPITAL_COMMUNITY): Payer: Medicare Other

## 2020-02-10 DIAGNOSIS — E860 Dehydration: Secondary | ICD-10-CM | POA: Diagnosis present

## 2020-02-10 DIAGNOSIS — I10 Essential (primary) hypertension: Secondary | ICD-10-CM | POA: Diagnosis not present

## 2020-02-10 DIAGNOSIS — Z79899 Other long term (current) drug therapy: Secondary | ICD-10-CM | POA: Diagnosis not present

## 2020-02-10 DIAGNOSIS — I110 Hypertensive heart disease with heart failure: Secondary | ICD-10-CM | POA: Diagnosis present

## 2020-02-10 DIAGNOSIS — J439 Emphysema, unspecified: Secondary | ICD-10-CM | POA: Diagnosis present

## 2020-02-10 DIAGNOSIS — K7030 Alcoholic cirrhosis of liver without ascites: Secondary | ICD-10-CM | POA: Diagnosis present

## 2020-02-10 DIAGNOSIS — E871 Hypo-osmolality and hyponatremia: Secondary | ICD-10-CM

## 2020-02-10 DIAGNOSIS — Z888 Allergy status to other drugs, medicaments and biological substances status: Secondary | ICD-10-CM | POA: Diagnosis not present

## 2020-02-10 DIAGNOSIS — F1010 Alcohol abuse, uncomplicated: Secondary | ICD-10-CM | POA: Diagnosis present

## 2020-02-10 DIAGNOSIS — I509 Heart failure, unspecified: Secondary | ICD-10-CM | POA: Diagnosis present

## 2020-02-10 DIAGNOSIS — K219 Gastro-esophageal reflux disease without esophagitis: Secondary | ICD-10-CM | POA: Diagnosis present

## 2020-02-10 DIAGNOSIS — Z91013 Allergy to seafood: Secondary | ICD-10-CM | POA: Diagnosis not present

## 2020-02-10 DIAGNOSIS — K760 Fatty (change of) liver, not elsewhere classified: Secondary | ICD-10-CM | POA: Diagnosis not present

## 2020-02-10 DIAGNOSIS — F17210 Nicotine dependence, cigarettes, uncomplicated: Secondary | ICD-10-CM | POA: Diagnosis present

## 2020-02-10 DIAGNOSIS — Z20822 Contact with and (suspected) exposure to covid-19: Secondary | ICD-10-CM | POA: Diagnosis present

## 2020-02-10 HISTORY — DX: Hypo-osmolality and hyponatremia: E87.1

## 2020-02-10 LAB — BASIC METABOLIC PANEL
Anion gap: 9 (ref 5–15)
Anion gap: 13 (ref 5–15)
Anion gap: 11 (ref 5–15)
BUN: 12 mg/dL (ref 8–23)
BUN: 12 mg/dL (ref 8–23)
BUN: 11 mg/dL (ref 8–23)
CO2: 27 mmol/L (ref 22–32)
CO2: 25 mmol/L (ref 22–32)
CO2: 26 mmol/L (ref 22–32)
Calcium: 8.9 mg/dL (ref 8.9–10.3)
Calcium: 8.7 mg/dL — ABNORMAL LOW (ref 8.9–10.3)
Calcium: 8.5 mg/dL — ABNORMAL LOW (ref 8.9–10.3)
Chloride: 83 mmol/L — ABNORMAL LOW (ref 98–111)
Chloride: 85 mmol/L — ABNORMAL LOW (ref 98–111)
Chloride: 92 mmol/L — ABNORMAL LOW (ref 98–111)
Creatinine, Ser: 1 mg/dL (ref 0.44–1.00)
Creatinine, Ser: 0.82 mg/dL (ref 0.44–1.00)
Creatinine, Ser: 0.77 mg/dL (ref 0.44–1.00)
GFR calc Af Amer: >60 mL/min (ref >60)
GFR calc Af Amer: >60 mL/min (ref >60)
GFR calc Af Amer: >60 mL/min (ref >60)
GFR calc non Af Amer: 59 mL/min — ABNORMAL LOW (ref >60)
GFR calc non Af Amer: >60 mL/min (ref >60)
GFR calc non Af Amer: >60 mL/min (ref >60)
Glucose, Bld: 81 mg/dL (ref 70–99)
Glucose, Bld: 80 mg/dL (ref 70–99)
Glucose, Bld: 97 mg/dL (ref 70–99)
Potassium: 3.9 mmol/L (ref 3.5–5.1)
Potassium: 3.9 mmol/L (ref 3.5–5.1)
Potassium: 3.8 mmol/L (ref 3.5–5.1)
Sodium: 119 mmol/L — CL (ref 135–145)
Sodium: 123 mmol/L — ABNORMAL LOW (ref 135–145)
Sodium: 129 mmol/L — ABNORMAL LOW (ref 135–145)

## 2020-02-10 LAB — OSMOLALITY, URINE: Osmolality, Ur: 168 mOsm/kg — ABNORMAL LOW (ref 300–900)

## 2020-02-10 LAB — BRAIN NATRIURETIC PEPTIDE: B Natriuretic Peptide: 547.1 pg/mL — ABNORMAL HIGH (ref 0.0–100.0)

## 2020-02-10 LAB — TSH: TSH: 1.028 u[IU]/mL (ref 0.350–4.500)

## 2020-02-10 LAB — SARS CORONAVIRUS 2 (TAT 6-24 HRS): SARS Coronavirus 2: NEGATIVE

## 2020-02-10 LAB — SODIUM, URINE, RANDOM: Sodium, Ur: 14 mmol/L

## 2020-02-10 MED ORDER — ONDANSETRON HCL 4 MG PO TABS: 4.0000 mg | ORAL_TABLET | Freq: Four times a day (QID) | ORAL | Status: DC | PRN

## 2020-02-10 MED ORDER — AMLODIPINE BESYLATE 10 MG PO TABS
10.0000 mg | ORAL_TABLET | Freq: Every day | ORAL | Status: DC
  Administered 2020-02-10 – 2020-02-11 (×2): 10 mg via ORAL
  Filled 2020-02-10: qty 2
  Filled 2020-02-10: qty 1

## 2020-02-10 MED ORDER — SUCRALFATE 1 G PO TABS
1.0000 g | ORAL_TABLET | Freq: Once | ORAL | Status: AC
  Administered 2020-02-10: 1 g via ORAL
  Filled 2020-02-10: qty 1

## 2020-02-10 MED ORDER — BACLOFEN 10 MG PO TABS
10.0000 mg | ORAL_TABLET | Freq: Two times a day (BID) | ORAL | Status: DC | PRN
  Administered 2020-02-10: 10 mg via ORAL
  Filled 2020-02-10 (×2): qty 1

## 2020-02-10 MED ORDER — ENOXAPARIN SODIUM 30 MG/0.3ML ~~LOC~~ SOLN
30.0000 mg | SUBCUTANEOUS | Status: DC
  Administered 2020-02-10 – 2020-02-11 (×2): 30 mg via SUBCUTANEOUS
  Filled 2020-02-10 (×2): qty 0.3

## 2020-02-10 MED ORDER — DICYCLOMINE HCL 20 MG PO TABS: 20.0000 mg | ORAL_TABLET | Freq: Four times a day (QID) | ORAL | Status: DC | PRN

## 2020-02-10 MED ORDER — SUCRALFATE 1 GM/10ML PO SUSP
1.0000 g | Freq: Three times a day (TID) | ORAL | Status: DC
  Administered 2020-02-10 – 2020-02-11 (×2): 1 g via ORAL
  Filled 2020-02-10 (×3): qty 10

## 2020-02-10 MED ORDER — TRAZODONE HCL 50 MG PO TABS
50.0000 mg | ORAL_TABLET | Freq: Every day | ORAL | Status: DC
  Administered 2020-02-10: 50 mg via ORAL
  Filled 2020-02-10: qty 1

## 2020-02-10 MED ORDER — TRAMADOL HCL 50 MG PO TABS
50.0000 mg | ORAL_TABLET | Freq: Four times a day (QID) | ORAL | Status: DC | PRN
  Administered 2020-02-10 – 2020-02-11 (×4): 50 mg via ORAL
  Filled 2020-02-10 (×5): qty 1

## 2020-02-10 MED ORDER — PANTOPRAZOLE SODIUM 40 MG PO TBEC: 40.0000 mg | DELAYED_RELEASE_TABLET | Freq: Two times a day (BID) | ORAL | Status: DC

## 2020-02-10 MED ORDER — ACETAMINOPHEN 650 MG RE SUPP: 650.0000 mg | Freq: Four times a day (QID) | RECTAL | Status: DC | PRN

## 2020-02-10 MED ORDER — ACETAMINOPHEN 325 MG PO TABS: 650.0000 mg | ORAL_TABLET | Freq: Four times a day (QID) | ORAL | Status: DC | PRN

## 2020-02-10 MED ORDER — HYDRALAZINE HCL 20 MG/ML IJ SOLN: 10.0000 mg | Freq: Four times a day (QID) | INTRAMUSCULAR | Status: DC | PRN

## 2020-02-10 MED ORDER — SALINE SPRAY 0.65 % NA SOLN
2.0000 | NASAL | Status: DC | PRN
  Filled 2020-02-10: qty 44

## 2020-02-10 MED ORDER — SODIUM CHLORIDE 0.9 % IV SOLN: INTRAVENOUS | Status: DC

## 2020-02-10 MED ORDER — ONDANSETRON HCL 4 MG/2ML IJ SOLN: 4.0000 mg | Freq: Four times a day (QID) | INTRAMUSCULAR | Status: DC | PRN

## 2020-02-10 MED ORDER — SODIUM CHLORIDE 1 G PO TABS
1.0000 g | ORAL_TABLET | Freq: Three times a day (TID) | ORAL | Status: DC
  Administered 2020-02-10 – 2020-02-11 (×4): 1 g via ORAL
  Filled 2020-02-10 (×7): qty 1

## 2020-02-10 NOTE — Consult Note (Signed)
[facility] Renal Consultation Note    Indication for Consultation:  Hyponatremia, hypertension/volume PCP: Orie Fisherman, PA-C  HPI: [name] is a 69 y.o. female with medical history significant of HTN, GERD. Pt recently admitted from 3/24-3/25 with hyponatremia.  Believed at that time to be due to HCTZ.  HCTZ discontinued and pt rehydrated and discharged with sodium 133. Went to PCP for follow up 02/09/20, sodium back down to 121, sent back to ED. Patient began feeling poorly on Saturday with fatigue, decreased appetite, lightheadedness and a fall without LOC. She admits to drinking 2-3 20oz bottle water daily. Additionally she endorses headache and some visual disturbances that have since resolved. She denies night sweats, nausea, vomiting, CP, palpitations. She was a very active lady until about one month ago at which point she was fatigued and lightheaded. Her activities include golf 3/week and working part-time at [employer].   ED Course: Pt having decreased appetite recently but otherwise asymptomatic this time around. Sodium 121>>119>>Started on NS at 125 cc/hr.  Past Medical History:  Diagnosis Date  . Abdominal pain, epigastric   . Acid reflux    Pt was born with acute acid  . CHF (congestive heart failure) ([place])   . GERD (gastroesophageal reflux disease)   . Heart murmur   . Hiatal hernia   . Hypertension   . Schatzki's ring   . Ulcer    8 years ago   Past Surgical History:  Procedure Laterality Date  . ABDOMINAL HYSTERECTOMY     complete for fibroids  . KNEE ARTHROSCOPY     right knee  . shoulder repalcement     right  . TONSILLECTOMY AND ADENOIDECTOMY     Family History  Problem Relation Age of Onset  . Prostate cancer Father   . Anxiety disorder Mother   . Colon cancer Maternal Grandmother    Social History:  reports that she has been smoking cigarettes. She has been smoking about 0.50 packs per day. She has never used smokeless tobacco.  She reports that she does not drink alcohol or use drugs.  ROS: As per HPI otherwise negative.  Review of Systems: Gen: Denies any fever, chills, fatigue, weakness, malaise, and/or sleep disorders. Admits weight loss HEENT: Denies sore throat or epistaxis. No sinusitis.  Admits visual disturbance CV: Denies chest pain, angina, palpitations, orthopnea, PND, peripheral edema, and claudication. Resp: Denies dyspnea at rest, dyspnea with exercise, cough, sputum, or wheezing. GI: Denies nausea, vomiting, abdominal pain, constipation or diarrhea. GU: Denies dysuria or hematuria. MS: Denies joint pain, muscle pain or cramps.  Derm: Denies rashes, itching, or ulcerations. Psych: Denies depression and anxiety. Heme: Denies bruising, bleeding, and enlarged lymph nodes. Neuro: Admits headache, dizziness, or weakness. Endocrine: No hypoglycemia or thyroid disease.  Physical Exam: Vitals:   02/10/20 0400 02/10/20 0527 02/10/20 0600 02/10/20 0700  BP: (!) 158/103 (!) 147/92 (!) 144/88 (!) 164/97  Pulse: 68 78 89 69  Resp: 19 18 20  (!) 23  Temp:      TempSrc:      SpO2: 91% 98% 92% 93%  Weight:      Height:         Physical Exam  Constitutional: She is oriented to person, place, and time and well-developed, well-nourished, and in no distress.  Cardiovascular: Normal rate, regular rhythm and normal heart sounds.  Pulmonary/Chest: Effort normal and breath sounds normal. She has no wheezes. She has no rales.  Abdominal: Soft. Bowel sounds are normal. She exhibits no  distension. There is no abdominal tenderness. There is no guarding.  Musculoskeletal:        General: Edema present.     Right lower leg: Edema (+1) present.     Left lower leg: Edema (+1) present.  Neurological: She is alert and oriented to person, place, and time.  Skin: Skin is warm and dry.    Allergies  Allergen Reactions  . Iodine Shortness Of Breath and Rash  . Shellfish Allergy     Causes rash   Prior to Admission  medications   Medication Sig Start Date End Date Taking? Authorizing Provider  baclofen (LIORESAL) 10 MG tablet Take 10 mg by mouth daily.   Yes [provider]  dicyclomine (BENTYL) 20 MG tablet Take 20 mg by mouth every 6 (six) hours as needed for spasms.  01/25/20  Yes [provider]  lansoprazole (PREVACID) 15 MG capsule Take 30 mg by mouth 2 (two) times daily as needed (Acid reflux).   Yes [provider]  ondansetron (ZOFRAN-ODT) 8 MG disintegrating tablet Take 8 mg by mouth every 8 (eight) hours as needed for nausea or vomiting.  01/29/20  Yes [provider]  sodium chloride (OCEAN) 0.65 % SOLN nasal spray Place 2 sprays into both nostrils as needed for congestion.   Yes [provider]  traMADol (ULTRAM) 50 MG tablet Take 50 mg by mouth every 6 (six) hours as needed for moderate pain.  08/11/14  Yes [provider]  traZODone (DESYREL) 50 MG tablet Take 50 mg by mouth at bedtime.   Yes [provider]   Current Facility-Administered Medications  Medication Dose Route Frequency Provider Last Rate Last Admin  . 0.9 %  sodium chloride infusion   Intravenous Continuous Deatra James, MD 75 mL/hr at 02/10/20 0902 Rate Verify at 02/10/20 0902  . acetaminophen (TYLENOL) tablet 650 mg  650 mg Oral Q6H PRN Etta Quill, DO       Or  . acetaminophen (TYLENOL) suppository 650 mg  650 mg Rectal Q6H PRN Etta Quill, DO      . baclofen (LIORESAL) tablet 10 mg  10 mg Oral Q12H PRN Shahmehdi, Seyed A, MD      . dicyclomine (BENTYL) tablet 20 mg  20 mg Oral Q6H PRN Etta Quill, DO      . enoxaparin (LOVENOX) injection 30 mg  30 mg Subcutaneous Q24H Alcario Drought, Jared M, DO      . ondansetron [facility]) tablet 4 mg  4 mg Oral Q6H PRN Etta Quill, DO       Or  . ondansetron [facility]) injection 4 mg  4 mg Intravenous Q6H PRN Etta Quill, DO      . sodium chloride (OCEAN) 0.65 % nasal spray 2 spray  2 spray Each Nare PRN  Shahmehdi, Seyed A, MD      . sodium chloride tablet 1 g  1 g Oral TID WC Shahmehdi, Seyed A, MD   1 g at 02/10/20 0851  . traMADol (ULTRAM) tablet 50 mg  50 mg Oral Q6H PRN Etta Quill, DO   50 mg at 02/10/20 [id number]  . traZODone (DESYREL) tablet 50 mg  50 mg Oral QHS Etta Quill, DO       Current Outpatient Medications  Medication Sig Dispense Refill  . baclofen (LIORESAL) 10 MG tablet Take 10 mg by mouth daily.    Marland Kitchen dicyclomine (BENTYL) 20 MG tablet Take 20 mg by mouth every 6 (six)  hours as needed for spasms.     . lansoprazole (PREVACID) 15 MG capsule Take 30 mg by mouth 2 (two) times daily as needed (Acid reflux).    . ondansetron (ZOFRAN-ODT) 8 MG disintegrating tablet Take 8 mg by mouth every 8 (eight) hours as needed for nausea or vomiting.     . sodium chloride (OCEAN) 0.65 % SOLN nasal spray Place 2 sprays into both nostrils as needed for congestion.    . traMADol (ULTRAM) 50 MG tablet Take 50 mg by mouth every 6 (six) hours as needed for moderate pain.     . traZODone (DESYREL) 50 MG tablet Take 50 mg by mouth at bedtime.     Labs: Basic Metabolic Panel: Recent Labs  Lab 02/09/20 1738 02/10/20 0438 02/10/20 0756  NA 121* 119* 123*  K 3.8 3.9 3.9  CL 81* 83* 85*  CO2 27 27 25   GLUCOSE 101* 81 80  BUN 14 12 12   CREATININE 0.95 1.00 0.82  CALCIUM 8.8* 8.9 8.7*   CBC: Recent Labs  Lab 02/03/20 1209 02/09/20 1738  WBC 11.6* 12.2*  HGB 13.9 11.6*  HCT 39.1 33.6*  MCV 86.3 89.1  PLT 377 411*   CBG: Recent Labs  Lab 02/03/20 1344 02/04/20 1120  GLUCAP 108* 91   Studies/Results: DG CHEST PORT 1 VIEW  Result Date: 02/10/2020 CLINICAL DATA:  Hyponatremia EXAM: PORTABLE CHEST 1 VIEW COMPARISON:  05/25/2011 FINDINGS: Borderline heart size with aortic tortuosity. There is a longstanding lower mediastinal mass, usually hiatal hernia. Mild reticular density at the right base. There is no edema, consolidation, effusion, or pneumothorax. Mild scoliosis. Right  shoulder replacement IMPRESSION: 1. Minimal atelectatic opacity. 2. Probable hiatal hernia. Electronically Signed   By: Monte Fantasia M.D.   On: 02/10/2020 04:51   Assessment/Plan  #Hyponatremia Recent hospitalization for same, over the weekend began feeling symptomatic again with lightheadedness and a fall without LOC, fatigue, unintentional weight loss and some decrease in appetite.  -BNP 547.1 with evidence of peripheral edema>>patient denies h/o HF, no echo in imaging hx -recheck urine sodium>>last 14(3/31) -check urine osmolarity  -check cortisol/TSH -gentle volume resuscitation due to mild edema in lower extremities with NS 75cc/hr -CT w/o contrast given smoking history, weight loss and chronic hyponatremia -renal panel now and serial BMP #Hypertension/volume:  HTN medications discontinued during last admission due to low BP values and AKI included: HCTZ, lisinopril  BP elevated since arrival to ED with symptoms of headache and some visual disturbances -amlodipine 10mg  daily  hydralazine 10mg  IV q6hr PRN if SBP > 160 #Anemia: This appears to be baseline for her. -Hgb 11.6 -monitor daily CBC -maintain Hgb >7.0  This appears to be hypovolemic hyponatremia.  There appears to be no confounding medications at this time.  There is the possibility that this could represent syndrome of inappropriate antidiuretic hormone secretion.  I doubt this is a result of either hypoadrenalism or hypothyroidism however it would be reasonable to check both of those.  She is also a long-term smoker and would recommend a CT scan of chest.  A urine osmolality will help as the specimen has not been contaminated with use of diuretics.  Her urine sodium was low and she had a brisk response to normal saline.  Restricting her free water intake and liberalizing her solute intake will most likely lead to resolution of this problem.

## 2020-02-10 NOTE — ED Provider Notes (Signed)
[facility] EMERGENCY DEPARTMENT Provider Note   CSN: [id number] Arrival date & time: 02/09/20  1636     History Chief Complaint  Patient presents with  . Abnormal Lab    [name] is a 69 y.o. female.   Abnormal Lab Time since result:  Hours  Resulting agency:  External Result type: chemistry   Chemistry:    Sodium:  Low Patient with a h/o of CHF and hyponatremia who was discharged on 3/25 following admission for hyponatremia presents with recurrence.  Patient was in her usual state of health and informed by her doctor that her sodium was critically low and she needed to be admitted again.  No f/c/r.  No seizures.  She has stopped her HCTZ.  She has not been on fluid restriction.       Past Medical History:  Diagnosis Date  . Abdominal pain, epigastric   . Acid reflux    Pt was born with acute acid  . CHF (congestive heart failure) ([place])   . GERD (gastroesophageal reflux disease)   . Heart murmur   . Hiatal hernia   . Hypertension   . Schatzki's ring   . Ulcer    8 years ago    Patient Active Problem List   Diagnosis Date Noted  . Hyponatremia 02/03/2020  . Ambulatory dysfunction   . Prediabetes 03/23/2013  . Hyperlipidemia 03/23/2013  . Colon cancer screening 11/18/2012  . Hypertension 09/16/2012  . GERD (gastroesophageal reflux disease) 09/16/2012  . Tobacco use 09/16/2012    Past Surgical History:  Procedure Laterality Date  . ABDOMINAL HYSTERECTOMY     complete for fibroids  . KNEE ARTHROSCOPY     right knee  . shoulder repalcement     right  . TONSILLECTOMY AND ADENOIDECTOMY       OB History   No obstetric history on file.     Family History  Problem Relation Age of Onset  . Prostate cancer Father   . Anxiety disorder Mother   . Colon cancer Maternal Grandmother     Social History   Tobacco Use  . Smoking status: Current Every Day Smoker    Packs/day: 0.50    Types: Cigarettes  . Smokeless tobacco: Never  Used  . Tobacco comment: almost a pack a day;   Substance Use Topics  . Alcohol use: No    Alcohol/week: 0.0 standard drinks  . Drug use: No    Home Medications Prior to Admission medications   Medication Sig Start Date End Date Taking? Authorizing Provider  dicyclomine (BENTYL) 20 MG tablet Take 20 mg by mouth every 6 (six) hours as needed for spasms.  01/25/20   [provider]  diphenoxylate-atropine (LOMOTIL) 2.5-0.025 MG tablet Take 1 tablet by mouth 4 (four) times daily as needed for diarrhea or loose stools. Patient not taking: Reported on 02/03/2020 08/17/15   Jerene Bears, MD  lansoprazole (PREVACID) 15 MG capsule Take 30 mg by mouth 2 (two) times daily as needed (Acid reflux).    [provider]  ondansetron (ZOFRAN-ODT) 8 MG disintegrating tablet Take 8 mg by mouth every 8 (eight) hours as needed for nausea or vomiting.  01/29/20   [provider]  sodium chloride (OCEAN) 0.65 % SOLN nasal spray Place 2 sprays into both nostrils as needed for congestion.    [provider]  traMADol (ULTRAM) 50 MG tablet Take 50 mg by mouth every 6 (six) hours as needed for moderate pain.  08/11/14   [provider]  traZODone (DESYREL) 50 MG tablet Take 50 mg by mouth at bedtime.    [provider]    Allergies    Iodine and Shellfish allergy  Review of Systems   Review of Systems  Constitutional: Negative for fever.  HENT: Negative for congestion.   Eyes: Negative for visual disturbance.  Respiratory: Negative for shortness of breath.   Cardiovascular: Negative for chest pain.  Gastrointestinal: Negative for abdominal pain.  Genitourinary: Negative for difficulty urinating.  Musculoskeletal: Negative for arthralgias.  Neurological: Negative for dizziness and seizures.  Psychiatric/Behavioral: Negative for agitation.  All other systems reviewed and are negative.   Physical Exam Updated Vital Signs BP (!) 161/101 (BP Location: Right  Arm)   Pulse 72   Temp 97.8 F (36.6 C) (Oral)   Resp 16   Ht 5\' 2"  (1.575 m)   Wt 41 kg   SpO2 94%   BMI 16.53 kg/m   Physical Exam Vitals and nursing note reviewed.  Constitutional:      General: She is not in acute distress.    Appearance: Normal appearance.  HENT:     Head: Normocephalic and atraumatic.     Nose: Nose normal.  Eyes:     Conjunctiva/sclera: Conjunctivae normal.     Pupils: Pupils are equal, round, and reactive to light.  Cardiovascular:     Rate and Rhythm: Normal rate and regular rhythm.     Pulses: Normal pulses.     Heart sounds: Normal heart sounds.  Pulmonary:     Effort: Pulmonary effort is normal.     Breath sounds: Normal breath sounds.  Abdominal:     General: Abdomen is flat. Bowel sounds are normal.     Tenderness: There is no abdominal tenderness. There is no guarding.  Musculoskeletal:        General: Normal range of motion.     Cervical back: Normal range of motion and neck supple.  Skin:    General: Skin is warm and dry.     Capillary Refill: Capillary refill takes less than 2 seconds.  Neurological:     General: No focal deficit present.     Mental Status: She is alert and oriented to person, place, and time.  Psychiatric:        Mood and Affect: Mood normal.        Behavior: Behavior normal.     ED Results / Procedures / Treatments   Labs (all labs ordered are listed, but only abnormal results are displayed) Results for orders placed or performed during the hospital encounter of 02/09/20  CBC  Result Value Ref Range   WBC 12.2 (H) 4.0 - 10.5 K/uL   RBC 3.77 (L) 3.87 - 5.11 MIL/uL   Hemoglobin 11.6 (L) 12.0 - 15.0 g/dL   HCT 33.6 (L) 36.0 - 46.0 %   MCV 89.1 80.0 - 100.0 fL   MCH 30.8 26.0 - 34.0 pg   MCHC 34.5 30.0 - 36.0 g/dL   RDW 13.9 11.5 - 15.5 %   Platelets 411 (H) 150 - 400 K/uL   nRBC 0.0 0.0 - 0.2 %  Basic metabolic panel  Result Value Ref Range   Sodium 121 (L) 135 - 145 mmol/L   Potassium 3.8 3.5 - 5.1  mmol/L   Chloride 81 (L) 98 - 111 mmol/L   CO2 27 22 - 32 mmol/L   Glucose, Bld 101 (H) 70 - 99 mg/dL   BUN 14 8 -  23 mg/dL   Creatinine, Ser 0.95 0.44 - 1.00 mg/dL   Calcium 8.8 (L) 8.9 - 10.3 mg/dL   GFR calc non Af Amer >60 >60 mL/min   GFR calc Af Amer >60 >60 mL/min   Anion gap 13 5 - 15   CT Head Wo Contrast  Result Date: 02/03/2020 CLINICAL DATA:  Headache, posttraumatic. Additional history provided: Hyponatremia, several instances of syncope, dizziness EXAM: CT HEAD WITHOUT CONTRAST TECHNIQUE: Contiguous axial images were obtained from the base of the skull through the vertex without intravenous contrast. COMPARISON:  Head CT 05/25/2011 FINDINGS: Brain: There is no evidence of acute intracranial hemorrhage, intracranial mass, midline shift or extra-axial fluid collection.No demarcated cortical infarction. Mild ill-defined hypoattenuation within the cerebral white matter is nonspecific, but consistent with chronic small vessel ischemic disease. Mild generalized parenchymal atrophy. Vascular: No hyperdense vessel.  Atherosclerotic calcifications Skull: Normal. Negative for fracture or focal lesion. Sinuses/Orbits: Visualized orbits demonstrate no acute abnormality. No significant paranasal sinus disease. Trace fluid within right mastoid air cells IMPRESSION: No evidence of acute intracranial abnormality. Mild generalized parenchymal atrophy and chronic small vessel ischemic disease. Trace right mastoid effusion. Electronically Signed   By: Kellie Simmering DO   On: 02/03/2020 14:23    Radiology No results found.  Procedures Procedures (including critical care time)  Medications Ordered in ED Medications  0.9 %  sodium chloride infusion (has no administration in time range)    ED Course  I have reviewed the triage vital signs and the nursing notes.  Pertinent labs & imaging results that were available during my care of the patient were reviewed by me and considered in my medical  decision making (see chart for details).    will admit to medicine for recurrent hyponatremia  Final Clinical Impression(s) / ED Diagnoses Final diagnoses:  Hyponatremia    Admit    Kriss Ishler, MD 02/10/20 [phone]

## 2020-02-10 NOTE — ED Notes (Signed)
MS   bfast ordered  

## 2020-02-10 NOTE — Evaluation (Signed)
Physical Therapy Evaluation Patient Details Name: [name] MRN: [id number] DOB: 1950-11-24 Today's Date: 02/10/2020   History of Present Illness  Pt is a 69 y/o female admitteds econdary to hyponatremia. Pt with recent admission for the same. PMH includes HTN, GERD and CHF.   Clinical Impression  Pt admitted secondary to problem above with deficits below. Pt with increased back pain and hx of fall. Pt requiring min guard A for mobility tasks. Educated about back precautions to help with pain management. Per pt, was scheduled to go to outpatient PT, however, had not made it before being readmitted. Recommend outpatient PT follow up to address current deficits. Will continue to follow acutely.     Follow Up Recommendations Outpatient PT    Equipment Recommendations  None recommended by PT    Recommendations for Other Services       Precautions / Restrictions Precautions Precautions: Fall Precaution Comments: Reports fall at home that resulted in back pain.  Restrictions Weight Bearing Restrictions: No      Mobility  Bed Mobility Overal bed mobility: Needs Assistance Bed Mobility: Supine to Sit;Sit to Supine     Supine to sit: Min assist Sit to supine: Supervision   General bed mobility comments: Reviewed use of log roll technique to help with back pain. Min A for trunk elevation to come to sitting.   Transfers Overall transfer level: Needs assistance Equipment used: None Transfers: Sit to/from Stand Sit to Stand: Min guard         General transfer comment: Min guard for safety.   Ambulation/Gait Ambulation/Gait assistance: Min guard Gait Distance (Feet): 200 Feet Assistive device: None Gait Pattern/deviations: Step-through pattern;Decreased stride length Gait velocity: Decreased   General Gait Details: Guarded gait secondary to pain. Performed horizontal and vertical head turns during gait to address balance. Had pt step over obstacle, however, pt  reported increased back pain with that.   Stairs            Wheelchair Mobility    Modified Rankin (Stroke Patients Only)       Balance Overall balance assessment: Needs assistance Sitting-balance support: No upper extremity supported;Feet supported Sitting balance-Leahy Scale: Good     Standing balance support: No upper extremity supported;During functional activity Standing balance-Leahy Scale: Fair                               Pertinent Vitals/Pain Pain Assessment: 0-10 Pain Score: 8  Pain Location: back Pain Descriptors / Indicators: Aching;Grimacing;Guarding Pain Intervention(s): Limited activity within patient's tolerance;Monitored during session;Patient requesting pain meds-RN notified;Repositioned    Home Living Family/patient expects to be discharged to:: Private residence Living Arrangements: Alone Available Help at Discharge: Family Type of Home: House Home Access: Stairs to enter Entrance Stairs-Rails: Left Entrance Stairs-Number of Steps: 2-3 Home Layout: One level Home Equipment: [place] - single point Additional Comments: Reports she will be staying with her brother at d/c     Prior Function Level of Independence: Independent         Comments: Very active and plays golf regularly     Hand Dominance        Extremity/Trunk Assessment   Upper Extremity Assessment Upper Extremity Assessment: Defer to OT evaluation    Lower Extremity Assessment Lower Extremity Assessment: Generalized weakness    Cervical / Trunk Assessment Cervical / Trunk Assessment: Kyphotic  Communication   Communication: No difficulties  Cognition Arousal/Alertness: Awake/alert Behavior During Therapy: [facility]  for tasks assessed/performed Overall Cognitive Status: Within Functional Limits for tasks assessed                                        General Comments      Exercises     Assessment/Plan    PT Assessment Patient needs  continued PT services  PT Problem List Decreased strength;Decreased balance;Decreased mobility;Pain       PT Treatment Interventions Gait training;Stair training;Functional mobility training;Therapeutic activities;Therapeutic exercise;Balance training;Patient/family education    PT Goals (Current goals can be found in the Care Plan section)  Acute Rehab PT Goals Patient Stated Goal: to feel better PT Goal Formulation: With patient Time For Goal Achievement: 02/24/20 Potential to Achieve Goals: Good    Frequency Min 3X/week   Barriers to discharge        Co-evaluation               AM-PAC PT "6 Clicks" Mobility  Outcome Measure Help needed turning from your back to your side while in a flat bed without using bedrails?: None Help needed moving from lying on your back to sitting on the side of a flat bed without using bedrails?: A Little Help needed moving to and from a bed to a chair (including a wheelchair)?: A Little Help needed standing up from a chair using your arms (e.g., wheelchair or bedside chair)?: A Little Help needed to walk in hospital room?: A Little Help needed climbing 3-5 steps with a railing? : A Little 6 Click Score: 19    End of Session Equipment Utilized During Treatment: Gait belt Activity Tolerance: Patient tolerated treatment well Patient left: in bed;with call bell/phone within reach Nurse Communication: Mobility status;Patient requests pain meds PT Visit Diagnosis: Other abnormalities of gait and mobility (R26.89);Pain;History of falling (Z91.81) Pain - part of body: (back)    Time: [id number] PT Time Calculation (min) (ACUTE ONLY): 15 min   Charges:   PT Evaluation $PT Eval Low Complexity: 1 Low          Lou Miner, DPT  Acute Rehabilitation Services  Pager: [phone] Office: [phone]   Rudean Hitt 02/10/2020, 4:16 PM

## 2020-02-10 NOTE — H&P (Signed)
History and Physical    [name] [id number] DOB: 1951/03/02 DOA: 02/09/2020  PCP: Nicholes Rough, PA-C  Patient coming from: Home  I have personally briefly reviewed patient's old medical records in [place]  Chief Complaint: Hyponatremia  HPI: [name] is a 69 y.o. female with medical history significant of HTN, GERD.  Pt recently admitted from 3/24-3/25 with hyponatremia.  Believed at that time to be due to HCTZ.  HCTZ discontinued and pt rehydrated and discharged with sodium 133.  Went to PCP for follow up today, sodium back down to 121, sent back to ED.  ED Course: Pt having decreased appetite recently but otherwise asymptomatic this time around.  Sodium 121.  EDP requests obs admit.  Started on NS at 125 cc/hr   Review of Systems: As per HPI, otherwise all review of systems negative.  Past Medical History:  Diagnosis Date  . Abdominal pain, epigastric   . Acid reflux    Pt was born with acute acid  . CHF (congestive heart failure) ([place])   . GERD (gastroesophageal reflux disease)   . Heart murmur   . Hiatal hernia   . Hypertension   . Schatzki's ring   . Ulcer    8 years ago    Past Surgical History:  Procedure Laterality Date  . ABDOMINAL HYSTERECTOMY     complete for fibroids  . KNEE ARTHROSCOPY     right knee  . shoulder repalcement     right  . TONSILLECTOMY AND ADENOIDECTOMY       reports that she has been smoking cigarettes. She has been smoking about 0.50 packs per day. She has never used smokeless tobacco. She reports that she does not drink alcohol or use drugs.  Allergies  Allergen Reactions  . Iodine Shortness Of Breath and Rash  . Shellfish Allergy     Causes rash    Family History  Problem Relation Age of Onset  . Prostate cancer Father   . Anxiety disorder Mother   . Colon cancer Maternal Grandmother      Prior to Admission medications   Medication Sig Start Date End Date Taking? Authorizing Provider    baclofen (LIORESAL) 10 MG tablet Take 10 mg by mouth daily.   Yes [provider]  dicyclomine (BENTYL) 20 MG tablet Take 20 mg by mouth every 6 (six) hours as needed for spasms.  01/25/20  Yes [provider]  lansoprazole (PREVACID) 15 MG capsule Take 30 mg by mouth 2 (two) times daily as needed (Acid reflux).   Yes [provider]  ondansetron (ZOFRAN-ODT) 8 MG disintegrating tablet Take 8 mg by mouth every 8 (eight) hours as needed for nausea or vomiting.  01/29/20  Yes [provider]  sodium chloride (OCEAN) 0.65 % SOLN nasal spray Place 2 sprays into both nostrils as needed for congestion.   Yes [provider]  traMADol (ULTRAM) 50 MG tablet Take 50 mg by mouth every 6 (six) hours as needed for moderate pain.  08/11/14  Yes [provider]  traZODone (DESYREL) 50 MG tablet Take 50 mg by mouth at bedtime.   Yes [provider]    Physical Exam: Vitals:   02/09/20 1637 02/09/20 1728 02/09/20 2341 02/10/20 0339  BP: (!) 156/89  (!) 161/101 (!) 188/108  Pulse: 80  72 72  Resp:   16 15  Temp: 97.8 F (36.6 C)     TempSrc: Oral     SpO2: 93%  94% 97%  Weight:  41 kg    Height:  5\' 2"  (1.575 m)      Constitutional: NAD, calm, comfortable Eyes: PERRL, lids and conjunctivae normal ENMT: Mucous membranes are moist. Posterior pharynx clear of any exudate or lesions.Normal dentition.  Neck: normal, supple, no masses, no thyromegaly Respiratory: clear to auscultation bilaterally, no wheezing, no crackles. Normal respiratory effort. No accessory muscle use.  Cardiovascular: Regular rate and rhythm, no murmurs / rubs / gallops. No extremity edema. 2+ pedal pulses. No carotid bruits.  Abdomen: no tenderness, no masses palpated. No hepatosplenomegaly. Bowel sounds positive.  Musculoskeletal: no clubbing / cyanosis. No joint deformity upper and lower extremities. Good ROM, no contractures. Normal muscle tone.  Skin: no rashes,  lesions, ulcers. No induration Neurologic: CN 2-12 grossly intact. Sensation intact, DTR normal. Strength 5/5 in all 4.  Psychiatric: Normal judgment and insight. Alert and oriented x 3. Normal mood.    Labs on Admission: I have personally reviewed following labs and imaging studies  CBC: Recent Labs  Lab 02/03/20 1209 02/09/20 1738  WBC 11.6* 12.2*  HGB 13.9 11.6*  HCT 39.1 33.6*  MCV 86.3 89.1  PLT 377 [id number]*   Basic Metabolic Panel: Recent Labs  Lab 02/03/20 1209 02/03/20 2038 02/04/20 0526 02/09/20 1738  NA 123* 127* 131* 121*  K 3.4* 3.3* 3.3* 3.8  CL 80* 90* 94* 81*  CO2 28 26 29 27   GLUCOSE 114* 222* 122* 101*  BUN 22 20 17 14   CREATININE 1.36* 1.50* 1.14* 0.95  CALCIUM 9.0 8.3* 8.2* 8.8*  MG  --   --  1.4*  --    GFR: Estimated Creatinine Clearance: 37.7 mL/min (by C-G formula based on SCr of 0.95 mg/dL). Liver Function Tests: No results for input(s): AST, ALT, ALKPHOS, BILITOT, PROT, ALBUMIN in the last 168 hours. No results for input(s): LIPASE, AMYLASE in the last 168 hours. No results for input(s): AMMONIA in the last 168 hours. Coagulation Profile: No results for input(s): INR, PROTIME in the last 168 hours. Cardiac Enzymes: No results for input(s): CKTOTAL, CKMB, CKMBINDEX, TROPONINI in the last 168 hours. BNP (last 3 results) No results for input(s): PROBNP in the last 8760 hours. HbA1C: No results for input(s): HGBA1C in the last 72 hours. CBG: Recent Labs  Lab 02/03/20 1344 02/04/20 1120  GLUCAP 108* 91   Lipid Profile: No results for input(s): CHOL, HDL, LDLCALC, TRIG, CHOLHDL, LDLDIRECT in the last 72 hours. Thyroid Function Tests: No results for input(s): TSH, T4TOTAL, FREET4, T3FREE, THYROIDAB in the last 72 hours. Anemia Panel: No results for input(s): VITAMINB12, FOLATE, FERRITIN, TIBC, IRON, RETICCTPCT in the last 72 hours. Urine analysis:    Component Value Date/Time   COLORURINE STRAW (A) 02/03/2020 1608   APPEARANCEUR CLEAR  02/03/2020 1608   LABSPEC 1.008 02/03/2020 1608   PHURINE 6.0 02/03/2020 1608   GLUCOSEU NEGATIVE 02/03/2020 1608   HGBUR NEGATIVE 02/03/2020 1608   BILIRUBINUR NEGATIVE 02/03/2020 1608   KETONESUR NEGATIVE 02/03/2020 1608   PROTEINUR NEGATIVE 02/03/2020 1608   UROBILINOGEN 0.2 05/25/2011 1337   NITRITE NEGATIVE 02/03/2020 1608   LEUKOCYTESUR NEGATIVE 02/03/2020 1608    Radiological Exams on Admission: No results found.  EKG: Independently reviewed.  Assessment/Plan Principal Problem:   Hyponatremia Active Problems:   Hypertension    1. Hyponatremia - 1. IVF: NS at 125 cc/hr 2. Urine sodium pending, was 45 last admit but was on HCTZ that time 3. CXR pending 4. Repeat BMP at 0500 to make sure  sodium increasing with NS  DVT prophylaxis: Lovenox Code Status: Full Family Communication: No family in room Disposition Plan: Home when sodium improved Consults called: None Admission status: Place in 67     Raissa Dam, [place] [facility]  How to contact the [facility] Attending or Consulting provider [place] or covering provider during after hours [place], for this patient?  1. Check the care team in [facility] and look for a) attending/consulting [facility] provider listed and b) the [facility] team listed 2. Log into [email or web address]  Amion Physician Scheduling and messaging for groups and whole hospitals  On call and physician scheduling software for group practices, residents, hospitalists and other medical providers for call, clinic, rotation and shift schedules. OnCall [employer] is a hospital-wide system for scheduling doctors and paging doctors on call. EasyPlot is for scientific plotting and data analysis.  [email or web address]  and use [place]'s universal password to access. If you do not have the password, please contact the hospital operator.  3. Locate the [facility] provider you are looking for under [facility] and page to a number that you can be directly reached. 4. If you still  have difficulty reaching the provider, please page the [facility] (Director on Call) for the Hospitalists listed on amion for assistance.  02/10/2020, 4:06 AM

## 2020-02-10 NOTE — Progress Notes (Signed)
PROGRESS NOTE    Patient: [name]                            PCP: Nicholes Rough, PA-C                    DOB: [birth year]            DOA: 02/09/2020 [id number]             DOS: 02/10/2020, 10:27 AM   LOS: 0 days   Date of Service: The patient was seen and examined on 02/10/2020  Subjective:   Patient was seen and examined this morning, remained stable, complaining of mild headache, blood pressure noted to be elevated. Denied of having any focal neurologic findings, such as visual changes, paresthesia or weakness.  Brief Narrative:    [name] is a 69 y.o. female with medical history significant of HTN, GERD ? CHF, presenting with recurrent hyponatremia  Pt recently admitted from 3/24-3/25 with hyponatremia.  Believed at that time to be due to HCTZ.  HCTZ discontinued and pt rehydrated and discharged with sodium 133.  Went to PCP for follow up today, sodium back down to 121    Assessment & Plan:   Principal Problem:   Hyponatremia Active Problems:   Hypertension    Hyponatremia -On admission sodium was 121 >>119 >>>123 -Likely due to dehydration, medications -SIADH work-up, urine sodium pending, but was 43 on last admission -HCTZ was DC'd on last admission -Chest x-ray-reviewed within normal limits -Monitoring sodium level closely, -Nephrology consulted for further evaluation recommendations   Weakness/falls -Likely exacerbated by hyponatremia -Stable PT/OT reconsulted for evaluation recommendation   Hypertension -Patient's home medication of HCTZ DC'd due to hyponatremia Currently avoiding diuretics -We will initiate calcium channel blocker Norvasc 10 mg daily -As needed hydralazine  GERD -PPI will be on hold as it may contribute to hyponatremia -As needed Carafate ordered   Nutritional status:        Cultures; None   Antimicrobials: None indicated  DVT prophylaxis: SCD/Compression stockings and Lovenox SQ Code Status:    Code Status: Full Code Family Communication: No family member present at bedside- attempt will be made to update daily The above findings and plan of care has been discussed with patient in detail,  they expressed understanding and agreement of above. Disposition Plan:   Anticipated 1-2 days Admission status:  NPATIEN -the patient needs close monitoring of sodium, electrolyte level, IV fluid hydration, consultation from subspecialty, nephrology  Disposition -1-2 days back home with previous settings Consultants: Nephrology Dr. Justin Mend   Procedures:   No admission procedures for hospital encounter.  Antimicrobials:  Anti-infectives (From admission, onward)   None       Medication:  . amLODipine  10 mg Oral Daily  . enoxaparin (LOVENOX) injection  30 mg Subcutaneous Q24H  . sodium chloride  1 g Oral TID WC  . traZODone  50 mg Oral QHS    acetaminophen **OR** acetaminophen, baclofen, dicyclomine, hydrALAZINE, ondansetron **OR** ondansetron (ZOFRAN) IV, sodium chloride, traMADol   Objective:   Vitals:   02/10/20 0527 02/10/20 0600 02/10/20 0700 02/10/20 0900  BP: (!) 147/92 (!) 144/88 (!) 164/97 (!) 181/106  Pulse: 78 89 69 68  Resp: 18 20 (!) 23 (!) 25  Temp:      TempSrc:      SpO2: 98% 92% 93% 100%  Weight:      Height:  No intake or output data in the 24 hours ending 02/10/20 1027 Filed Weights   02/09/20 1728  Weight: 41 kg     Examination:   Physical Exam  Constitution:  Alert, cooperative, no distress,  Appears calm and comfortable  Psychiatric: Normal and stable mood and affect, cognition intact,   HEENT: Normocephalic, PERRL, otherwise with in Normal limits  Chest:Chest symmetric Cardio vascular:  S1/S2, RRR, No murmure, No Rubs or Gallops  pulmonary: Clear to auscultation bilaterally, respirations unlabored, negative wheezes / crackles Abdomen: Soft, non-tender, non-distended, bowel sounds,no masses, no organomegaly Muscular skeletal: Limited exam  - in bed, able to move all 4 extremities, Normal strength,  Neuro: CNII-XII intact. , normal motor and sensation, reflexes intact  Extremities: No pitting edema lower extremities, +2 pulses  Skin: Dry, warm to touch, negative for any Rashes, No open wounds Wounds: per nursing documentation     LABs:  CBC Latest Ref Rng & Units 02/09/2020 02/03/2020 11/03/2014  WBC 4.0 - 10.5 K/uL 12.2(H) 11.6(H) 11.3(H)  Hemoglobin 12.0 - 15.0 g/dL 11.6(L) 13.9 14.8  Hematocrit 36.0 - 46.0 % 33.6(L) 39.1 45.4  Platelets 150 - 400 K/uL 411(H) 377 416.0(H)   CMP Latest Ref Rng & Units 02/10/2020 02/10/2020 02/09/2020  Glucose 70 - 99 mg/dL 80 81 101(H)  BUN 8 - 23 mg/dL 12 12 14   Creatinine 0.44 - 1.00 mg/dL 0.82 1.00 0.95  Sodium 135 - 145 mmol/L 123(L) 119(LL) 121(L)  Potassium 3.5 - 5.1 mmol/L 3.9 3.9 3.8  Chloride 98 - 111 mmol/L 85(L) 83(L) 81(L)  CO2 22 - 32 mmol/L 25 27 27   Calcium 8.9 - 10.3 mg/dL 8.7(L) 8.9 8.8(L)  Total Protein 6.0 - 8.3 g/dL - - -  Total Bilirubin 0.2 - 1.2 mg/dL - - -  Alkaline Phos 39 - 117 U/L - - -  AST 0 - 37 U/L - - -  ALT 0 - 35 U/L - - -        SIGNED: Deatra James, MD, FACP, FHM. [facility],  Pager [phone] please use amion to page  (not the numeric phone dial)  If 7PM-7AM, please contact night-coverage [email or web address] Ota [facility] 02/10/2020, 10:27 AM

## 2020-02-11 ENCOUNTER — Encounter (HOSPITAL_COMMUNITY): Payer: Self-pay | Admitting: Family Medicine

## 2020-02-11 DIAGNOSIS — F17200 Nicotine dependence, unspecified, uncomplicated: Secondary | ICD-10-CM

## 2020-02-11 DIAGNOSIS — F1010 Alcohol abuse, uncomplicated: Secondary | ICD-10-CM

## 2020-02-11 DIAGNOSIS — K219 Gastro-esophageal reflux disease without esophagitis: Secondary | ICD-10-CM

## 2020-02-11 DIAGNOSIS — I10 Essential (primary) hypertension: Secondary | ICD-10-CM

## 2020-02-11 DIAGNOSIS — J439 Emphysema, unspecified: Secondary | ICD-10-CM

## 2020-02-11 DIAGNOSIS — K760 Fatty (change of) liver, not elsewhere classified: Secondary | ICD-10-CM

## 2020-02-11 LAB — BASIC METABOLIC PANEL
Anion gap: 8 (ref 5–15)
BUN: 7 mg/dL — ABNORMAL LOW (ref 8–23)
CO2: 28 mmol/L (ref 22–32)
Calcium: 8.1 mg/dL — ABNORMAL LOW (ref 8.9–10.3)
Chloride: 96 mmol/L — ABNORMAL LOW (ref 98–111)
Creatinine, Ser: 0.78 mg/dL (ref 0.44–1.00)
GFR calc Af Amer: >60 mL/min (ref >60)
GFR calc non Af Amer: >60 mL/min (ref >60)
Glucose, Bld: 100 mg/dL — ABNORMAL HIGH (ref 70–99)
Potassium: 3.4 mmol/L — ABNORMAL LOW (ref 3.5–5.1)
Sodium: 132 mmol/L — ABNORMAL LOW (ref 135–145)

## 2020-02-11 LAB — RENAL FUNCTION PANEL
Albumin: 2.9 g/dL — ABNORMAL LOW (ref 3.5–5.0)
Anion gap: 11 (ref 5–15)
BUN: 10 mg/dL (ref 8–23)
CO2: 26 mmol/L (ref 22–32)
Calcium: 8.4 mg/dL — ABNORMAL LOW (ref 8.9–10.3)
Chloride: 96 mmol/L — ABNORMAL LOW (ref 98–111)
Creatinine, Ser: 0.7 mg/dL (ref 0.44–1.00)
GFR calc Af Amer: >60 mL/min (ref >60)
GFR calc non Af Amer: >60 mL/min (ref >60)
Glucose, Bld: 92 mg/dL (ref 70–99)
Phosphorus: 2.8 mg/dL (ref 2.5–4.6)
Potassium: 3.5 mmol/L (ref 3.5–5.1)
Sodium: 133 mmol/L — ABNORMAL LOW (ref 135–145)

## 2020-02-11 LAB — CORTISOL: Cortisol, Plasma: 20.9 ug/dL

## 2020-02-11 MED ORDER — SODIUM CHLORIDE 1 G PO TABS: 1.0000 g | ORAL_TABLET | Freq: Two times a day (BID) | ORAL | 1 refills | Status: DC

## 2020-02-11 MED ORDER — AMLODIPINE BESYLATE 10 MG PO TABS: 10.0000 mg | ORAL_TABLET | Freq: Every day | ORAL | 1 refills | Status: DC

## 2020-02-11 MED ORDER — NICOTINE 21 MG/24HR TD PT24: 21.0000 mg | MEDICATED_PATCH | TRANSDERMAL | 1 refills | Status: AC

## 2020-02-11 MED FILL — SODIUM CHLORIDE 1 GM TABS: 1 | 30 days supply | Qty: 60 | Fill #0

## 2020-02-11 MED FILL — AMLODIPINE BESYLATE 10 MG T: 10 | 90 days supply | Qty: 90 | Fill #0

## 2020-02-11 NOTE — TOC Initial Note (Signed)
Transition of Care [facility]) - Initial/Assessment Note    Patient Details  Name: [name] MRN: [id number] Date of Birth: Dec 14, 1950  [facility]) CM/SW Contact:    Marilu Favre, RN Phone Number: 02/11/2020, 12:05 PM  Clinical Narrative:                 Spoke to patient via phone. Patient already set up OP PT through PCP [facility] [phone]. Patient does remember name of [place]. Orson Ape PA office awaiting call back.   Patient does not have to wait. If ready she can discharge   Expected Discharge Plan: Home/Self Care Barriers to Discharge: No Barriers Identified   Patient Goals and CMS Choice Patient states their goals for this hospitalization and ongoing recovery are:: to return to home CMS Medicare.gov Compare Post Acute Care list provided to:: Patient Choice offered to / list presented to : Patient  Expected Discharge Plan and Services Expected Discharge Plan: Home/Self Care       Living arrangements for the past 2 months: Single Family Home Expected Discharge Date: 02/11/20               DME Arranged: N/A         HH Arranged: NA          Prior Living Arrangements/Services Living arrangements for the past 2 months: Single Family Home   Patient language and need for interpreter reviewed:: Yes        Need for Family Participation in Patient Care: Yes (Comment) Care giver support system in place?: Yes (comment)   Criminal Activity/Legal Involvement Pertinent to Current Situation/Hospitalization: No - Comment as needed  Activities of Daily Living Home Assistive Devices/Equipment: Eyeglasses ADL Screening (condition at time of admission) Patient's cognitive ability adequate to safely complete daily activities?: Yes Is the patient deaf or have difficulty hearing?: No Does the patient have difficulty seeing, even when wearing glasses/contacts?: No Does the patient have difficulty concentrating, remembering, or making decisions?:  No Patient able to express need for assistance with ADLs?: Yes Does the patient have difficulty dressing or bathing?: No Independently performs ADLs?: Yes (appropriate for developmental age) Does the patient have difficulty walking or climbing stairs?: No Weakness of Legs: None Weakness of Arms/Hands: None  Permission Sought/Granted Permission sought to share information with : PCP Permission granted to share information with : Yes, Verbal Permission Granted  Share Information with NAME: Nicholes Rough           Emotional Assessment   Attitude/Demeanor/Rapport: Engaged Affect (typically observed): Accepting Orientation: : Oriented to Situation, Oriented to  Time, Oriented to Place, Oriented to Self Alcohol / Substance Use: Not Applicable    Admission diagnosis:  Hyponatremia [E87.1] Patient Active Problem List   Diagnosis Date Noted  . Hyponatremia 02/03/2020  . Ambulatory dysfunction   . Prediabetes 03/23/2013  . Hyperlipidemia 03/23/2013  . Colon cancer screening 11/18/2012  . Hypertension 09/16/2012  . GERD (gastroesophageal reflux disease) 09/16/2012  . Tobacco use 09/16/2012   PCP:  Nicholes Rough, PA-C Pharmacy:   [place], [place] - [address] [place] [address] Phone: [phone] Fax: [place], [place] [address] [place] [address] Phone: [phone] Fax: [phone]     Social Determinants of Health (SDOH) Interventions    Readmission Risk Interventions No flowsheet data found.

## 2020-02-11 NOTE — Evaluation (Signed)
Occupational Therapy Evaluation and Discharge Patient Details Name: [name] MRN: [id number] DOB: Sep 18, 1951 Today's Date: 02/11/2020    History of Present Illness Pt is a 69 y/o female admitteds econdary to hyponatremia. Pt with recent admission for the same. PMH includes HTN, GERD and CHF.    Clinical Impression   Pt is functioning independently. No OT needs. Plans to d/c home with brother for a few days.    Follow Up Recommendations  No OT follow up    Equipment Recommendations  None recommended by OT    Recommendations for Other Services       Precautions / Restrictions Precautions Precautions: Fall Precaution Comments: reports falls only when hyponatremic Restrictions Weight Bearing Restrictions: No      Mobility Bed Mobility Overal bed mobility: Independent                Transfers Overall transfer level: Independent Equipment used: None                  Balance Overall balance assessment: No apparent balance deficits (not formally assessed)                                         ADL either performed or assessed with clinical judgement   ADL Overall ADL's : Independent                                             Vision Baseline Vision/History: Wears glasses Wears Glasses: At all times Patient Visual Report: No change from baseline       Perception     Praxis      Pertinent Vitals/Pain Pain Assessment: Faces Faces Pain Scale: Hurts little more Pain Location: back Pain Descriptors / Indicators: Aching;Grimacing;Guarding Pain Intervention(s): Repositioned;Heat applied     Hand Dominance Right   Extremity/Trunk Assessment Upper Extremity Assessment Upper Extremity Assessment: Overall WFL for tasks assessed   Lower Extremity Assessment Lower Extremity Assessment: Overall WFL for tasks assessed   Cervical / Trunk Assessment Cervical / Trunk Assessment: Kyphotic   Communication  Communication Communication: No difficulties   Cognition Arousal/Alertness: Awake/alert Behavior During Therapy: WFL for tasks assessed/performed Overall Cognitive Status: Within Functional Limits for tasks assessed                                     General Comments       Exercises     Shoulder Instructions      Home Living Family/patient expects to be discharged to:: Private residence Living Arrangements: Alone Available Help at Discharge: Family;Available 24 hours/day(plans to go home with brother) Type of Home: House Home Access: Stairs to enter [employer] of Steps: 2-3 Entrance Stairs-Rails: Left Home Layout: One level     Bathroom Shower/Tub: Occupational psychologist: Standard     Home Equipment: Cane - single point          Prior Functioning/Environment Level of Independence: Independent        Comments: Very active and plays golf regularly        OT Problem List:        OT Treatment/Interventions:      OT Goals(Current goals can  be found in the care plan section) Acute Rehab OT Goals Patient Stated Goal: to feel better  OT Frequency:     Barriers to D/C:            Co-evaluation              AM-PAC OT "6 Clicks" Daily Activity     Outcome Measure Help from another person eating meals?: None Help from another person taking care of personal grooming?: None Help from another person toileting, which includes using toliet, bedpan, or urinal?: None Help from another person bathing (including washing, rinsing, drying)?: None Help from another person to put on and taking off regular upper body clothing?: None Help from another person to put on and taking off regular lower body clothing?: None 6 Click Score: 24   End of Session    Activity Tolerance: Patient tolerated treatment well Patient left: in bed;with call bell/phone within reach;with bed alarm set  OT Visit Diagnosis: Pain                 Time: 1000-1020 OT Time Calculation (min): 20 min Charges:  OT General Charges $OT Visit: 1 Visit OT Evaluation $OT Eval Low Complexity: 1 Low  Nestor Lewandowsky, OTR/L Acute Rehabilitation Services Pager: [phone] Office: [phone]  [name] 02/11/2020, 11:33 AM

## 2020-02-11 NOTE — Discharge Instructions (Signed)
Hyponatremia Hyponatremia is when the amount of salt (sodium) in your blood is too low. When salt levels are low, your body may take in extra water. This can cause swelling throughout the body. The swelling often affects the brain. What are the causes? This condition may be caused by:  Certain medical problems or conditions.  Vomiting a lot.  Having watery poop (diarrhea) often.  Certain medicines or illegal drugs.  Not having enough water in the body (dehydration).  Drinking too much water.  Eating a diet that is low in salt.  Large burns on your body.  Too much sweating. What increases the risk? You are more likely to get this condition if you:  Have long-term (chronic) kidney disease.  Have heart failure.  Have a medical condition that causes you to have watery poop often.  Do very hard exercises.  Take medicines that affect the amount of salt is in your blood. What are the signs or symptoms? Symptoms of this condition include:  Headache.  Feeling like you may vomit (nausea).  Vomiting.  Being very tired (lethargic).  Muscle weakness and cramps.  Not wanting to eat as much as normal (loss of appetite).  Feeling weak or light-headed. Severe symptoms of this condition include:  Confusion.  Feeling restless (agitation).  Having a fast heart rate.  Passing out (fainting).  Seizures.  Coma. How is this treated? Treatment for this condition depends on the cause. Treatment may include:  Getting fluids through an IV tube that is put into one of your veins.  Taking medicines to fix the salt levels in your blood. If medicines are causing the problem, your medicines will need to be changed.  Limiting how much water or fluid you take in.  Monitoring in the hospital to watch your symptoms. Follow these instructions at home:   Take over-the-counter and prescription medicines only as told by your doctor. Many medicines can make this condition worse.  Talk with your doctor about any medicines that you are taking.  Eat and drink exactly as you are told by your doctor. ? Eat only the foods you are told to eat. ? Limit how much fluid you take.  Do not drink alcohol.  Keep all follow-up visits as told by your doctor. This is important. Contact a doctor if:  You feel more like you may vomit.  You feel more tired.  Your headache gets worse.  You feel more confused.  You feel weaker.  Your symptoms go away and then they come back.  You have trouble following the diet instructions. Get help right away if:  You have a seizure.  You pass out.  You keep having watery poop.  You keep vomiting. Summary  Hyponatremia is when the amount of salt in your blood is too low.  When salt levels are low, you can have swelling throughout the body. The swelling mostly affects the brain.  Treatment depends on the cause. Treatment may include getting IV fluids, medicines, or not drinking as much fluid. This information is not intended to replace advice given to you by your health care provider. Make sure you discuss any questions you have with your health care provider. Document Revised: 01/15/2019 Document Reviewed: 10/02/2018 Elsevier Patient Education  2020 Elsevier Inc.  

## 2020-02-11 NOTE — Progress Notes (Signed)
[name] ROUNDING NOTE   Subjective:   [name] is a 69 y.o. female with medical history significant ofHTN, GERD. Pt recently admitted from 3/24-3/25 with hyponatremia. Believed at that time to be due to HCTZ. HCTZ discontinued and pt rehydrated and discharged with sodium 133. Went to PCP for follow up 02/09/20, sodium back down to 121, sent back to ED. Patient began feeling poorly on Saturday with fatigue, decreased appetite, lightheadedness and a fall without LOC. She admits to drinking 2-3 20oz bottle water daily. Additionally she endorses headache and some visual disturbances that have since resolved. She denies night sweats, nausea, vomiting, CP, palpitations. She was a very active lady until about one month ago at which point she was fatigued and lightheaded. Her activities include golf 3/week and working part-time at [employer].   She is feeling much better this morning.  Urine osmolality 168 urine sodium 14.  TSH and cortisol levels within normal range.  CT scan of chest chest reveals minimal emphysema atelectasis of bilateral lower lungs hiatal hernia hepatic steatosis   atherosclerosis  Blood pressure 141/84 pulse 76 temperature 98.2 O2 sats 95% room air  Sodium 133 potassium 3.5 chloride 96 CO2 26 BUN 10 creatinine 0.7 calcium 8.4 phosphorus 2.8 albumin 2.9 hemoglobin 11.6.  WBC 12.2  Amlodipine 10 mg daily sodium chloride 1 g 3 times daily Carafate 3 times daily with meals trazodone 50 mg nightly.  Normal saline 75 cc an hour   Objective:  Vital signs in last 24 hours:  Temp:  [97.7 F (36.5 C)-98.5 F (36.9 C)] 98.2 F (36.8 C) (04/01 0450) Pulse Rate:  [71-85] 76 (04/01 0450) Resp:  [16-19] 18 (04/01 0450) BP: (135-189)/(84-110) 141/84 (04/01 0450) SpO2:  [95 %-100 %] 95 % (04/01 0450)  Weight change:  Filed Weights   02/09/20 1728  Weight: 41 kg    Intake/Output: I/O last 3 completed shifts: In: 2491.7 [P.O.:480; I.V.:2011.7] Out: -     Intake/Output this shift:  Total I/O In: 327.5 [P.O.:240; I.V.:87.5] Out: -   CVS- RRR no murmurs rubs gallops RS-  clear to auscultation no wheeze or rales ABD- BS present soft non-distended EXT- no edema   Basic Metabolic Panel: Recent Labs  Lab 02/09/20 1738 02/09/20 1738 02/10/20 0438 02/10/20 0438 02/10/20 0756 02/10/20 1525 02/10/20 2359  NA 121*  --  119*  --  123* 129* 133*  K 3.8  --  3.9  --  3.9 3.8 3.5  CL 81*  --  83*  --  85* 92* 96*  CO2 27  --  27  --  25 26 26   GLUCOSE 101*  --  81  --  80 97 92  BUN 14  --  12  --  12 11 10   CREATININE 0.95  --  1.00  --  0.82 0.77 0.70  CALCIUM 8.8*   < > 8.9   < > 8.7* 8.5* 8.4*  PHOS  --   --   --   --   --   --  2.8   < > = values in this interval not displayed.    Liver Function Tests: Recent Labs  Lab 02/10/20 2359  ALBUMIN 2.9*   No results for input(s): LIPASE, AMYLASE in the last 168 hours. No results for input(s): AMMONIA in the last 168 hours.  CBC: Recent Labs  Lab 02/09/20 1738  WBC 12.2*  HGB 11.6*  HCT 33.6*  MCV 89.1  PLT 411*    Cardiac  Enzymes: No results for input(s): CKTOTAL, CKMB, CKMBINDEX, TROPONINI in the last 168 hours.  BNP: Invalid input(s): POCBNP  CBG: Recent Labs  Lab 02/04/20 1120  GLUCAP 91    Microbiology: Results for orders placed or performed during the hospital encounter of 02/09/20  SARS CORONAVIRUS 2 (TAT 6-24 HRS) Nasopharyngeal Nasopharyngeal Swab     Status: None   Collection Time: 02/10/20  4:38 AM   Specimen: Nasopharyngeal Swab  Result Value Ref Range Status   SARS Coronavirus 2 NEGATIVE NEGATIVE Final    Comment: (NOTE) SARS-CoV-2 target nucleic acids are NOT DETECTED. The SARS-CoV-2 RNA is generally detectable in upper and lower respiratory specimens during the acute phase of infection. Negative results do not preclude SARS-CoV-2 infection, do not rule out co-infections with other pathogens, and should not be used as the sole basis for  treatment or other patient management decisions. Negative results must be combined with clinical observations, patient history, and epidemiological information. The expected result is Negative. Fact Sheet for Patients: SugarRoll.be Fact Sheet for Healthcare Providers: https://www.woods-mathews.com/ This test is not yet approved or cleared by the [place] FDA and  has been authorized for detection and/or diagnosis of SARS-CoV-2 by FDA under an Emergency Use Authorization (EUA). This EUA will remain  in effect (meaning this test can be used) for the duration of the COVID-19 declaration under Section 56 4(b)(1) of the Act, 21 U.S.C. section 360bbb-3(b)(1), unless the authorization is terminated or revoked sooner. Performed at [facility], [place] [address], [place], [place] [address]     Coagulation Studies: No results for input(s): LABPROT, INR in the last 72 hours.  Urinalysis: No results for input(s): COLORURINE, LABSPEC, PHURINE, GLUCOSEU, HGBUR, BILIRUBINUR, KETONESUR, PROTEINUR, UROBILINOGEN, NITRITE, LEUKOCYTESUR in the last 72 hours.  Invalid input(s): APPERANCEUR    Imaging: CT CHEST WO CONTRAST  Result Date: 02/10/2020 CLINICAL DATA:  Smoking history, weight loss, chronic hyponatremia EXAM: CT CHEST WITHOUT CONTRAST TECHNIQUE: Multidetector CT imaging of the chest was performed following the standard protocol without IV contrast. COMPARISON:  Same-day chest radiographs FINDINGS: Cardiovascular: Aortic atherosclerosis. Normal heart size. No pericardial effusion. Mediastinum/Nodes: No enlarged mediastinal, hilar, or axillary lymph nodes. Large hiatal hernia, with intrathoracic position of the gastric body and fundus. Thyroid gland, trachea, and esophagus demonstrate no significant findings. Lungs/Pleura: Minimal centrilobular emphysema. Bandlike scarring or atelectasis of the bilateral lower lungs. No pleural effusion or  pneumothorax. Upper Abdomen: No acute abnormality.  Hepatic steatosis. Musculoskeletal: No chest wall mass or suspicious bone lesions identified. Age indeterminate high-grade wedge deformity of T8. Status post right shoulder arthroplasty. IMPRESSION: 1.  Minimal emphysema.  Emphysema (ICD10-J43.9). 2.  Bandlike scarring or atelectasis of the bilateral lower lungs. 3.  Large hiatal hernia. 4.  Hepatic steatosis. 5.  Aortic Atherosclerosis (ICD10-I70.0). Electronically Signed   By: Eddie Candle M.D.   On: 02/10/2020 15:33   DG CHEST PORT 1 VIEW  Result Date: 02/10/2020 CLINICAL DATA:  Hyponatremia EXAM: PORTABLE CHEST 1 VIEW COMPARISON:  05/25/2011 FINDINGS: Borderline heart size with aortic tortuosity. There is a longstanding lower mediastinal mass, usually hiatal hernia. Mild reticular density at the right base. There is no edema, consolidation, effusion, or pneumothorax. Mild scoliosis. Right shoulder replacement IMPRESSION: 1. Minimal atelectatic opacity. 2. Probable hiatal hernia. Electronically Signed   By: Monte Fantasia M.D.   On: 02/10/2020 04:51     Medications:   . sodium chloride 75 mL/hr at 02/11/20 0033   . amLODipine  10 mg Oral Daily  . enoxaparin (  LOVENOX) injection  30 mg Subcutaneous Q24H  . sodium chloride  1 g Oral TID WC  . sucralfate  1 g Oral TID WC & HS  . traZODone  50 mg Oral QHS   acetaminophen **OR** acetaminophen, baclofen, dicyclomine, hydrALAZINE, ondansetron **OR** ondansetron (ZOFRAN) IV, sodium chloride, traMADol  Assessment/ Plan:   Tea and Toast Syndrome.  She also has a very dilute urine and low sodium.  This is very consistent with low solute intake.  I instructed her that she needs to increase her solute and decrease  water intake.  Sodium is now returned to its baseline level.  She can follow-up with [facility] in 1 month.    LOS: [place] @TODAY @9 :30 AM

## 2020-02-11 NOTE — Progress Notes (Signed)
Discharge teaching complete. Meds, diet, activity, follow up appointments reviewed and all questions answered. Copy of instructions given to patient. Prescriptions sent to pharmacy.

## 2020-02-11 NOTE — Discharge Summary (Signed)
Physician Discharge Summary  [name] [id number] DOB: 1951/06/19 DOA: 02/09/2020  PCP: Nicholes Rough, PA-C  Admit date: 02/09/2020 Discharge date: 02/11/2020  Admitted From: Home. Disposition: Home.  Recommendations for Outpatient Follow-up:  1. Follow ups as below. 2. Please obtain CBC/BMP/Mag at follow up 3. Please follow up on the following pending results: None  Home Health: None.  Outpatient PT Equipment/Devices: None  Discharge Condition: Stable CODE STATUS: Full code  Follow-up Information    Nicholes Rough, PA-C. Schedule an appointment as soon as possible for a visit in 1 week(s).   Specialty: Physician Assistant Contact information: [place] [address] [phone]        [place], [place]. Schedule an appointment as soon as possible for a visit in 1 month(s).   Contact information: [place] [address] [phone]           Hospital Course: 69 y.o. female with medical history significant ofHTN, GERD and recent hospitalization from 3/24-3/25 with hyponatremia presenting from PCP office with hyponatremia to 121.  She has associated symptoms of feeling poorly, fatigue, decreased appetite, headache, some visual disturbances, lightheadedness and a fall without head impact or LOC. She admits to drinking 2-3 20oz bottle water daily.  Also drinks 1 cup of vodka daily.  She was taken off HCTZ after recent hospitalization.   Patient was admitted and started on IV normal saline with sodium tablets. Nephrology consulted Urine osmolality 168.  Urine sodium 14.  TSH and cortisol within normal range.  CT chest without contrast with emphysema, atelectasis and hepatic steatosis but no other significant finding.    On the day of discharge, sodium up to 133 (about baseline).  Felt well and ready to go home.  She was a started on amlodipine for blood pressure.  Discharged on p.o. NaCl 1 g twice daily.  Advised to avoid alcohol and  excessive water.  Advised to follow-up with PCP in 1 week for repeat BMP.  See individual problem list below for more hospital course.  Discharge Diagnoses:  Hyponatremia-thought to be tea and toast syndrome from low solute intake and probably from alcohol. -Advised to increase solute intake and decrease water intake -Advised to stop alcohol. -Discharged on p.o. sodium chloride twice daily -Repeat BMP in 1 week.  Essential hypertension -Discharged on amlodipine 10 mg daily.  Alcohol use disorder -Counseled.  Tobacco use disorder -Encouraged to quit smoking.  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  GERD -Continue PPI  Weakness/fall-likely due to hyponatremia -Outpatient PT   Discharge Instructions  Discharge Instructions    Call MD for:  difficulty breathing, headache or visual disturbances   Complete by: As directed    Call MD for:  extreme fatigue   Complete by: As directed    Call MD for:  persistant dizziness or light-headedness   Complete by: As directed    Call MD for:  persistant nausea and vomiting   Complete by: As directed    Call MD for:  temperature >100.4   Complete by: As directed    Diet - low sodium heart healthy   Complete by: As directed    Discharge instructions   Complete by: As directed    It has been a pleasure taking care of you! You were hospitalized with low sodium level.  This could be due to low sodium intake, alcohol and possibly from excess water drinking.  We are discharging you  on salt tablet.  We also encourage you to limit the amount of water you drink to less than 1500 cc (6 cups) a day.  We strongly encourage you to quit drinking alcohol and quit smoking cigarettes.  Same might be new medications or changes to your home medications during this hospitalization. Please review your new medication list and the directions before you take your  medications.  Please think about quitting smoking.  This is very important for your health.  You may try nicotine patch which could help with quitting smoking.  You can also call 1-800-QUIT-NOW 332-268-9139) for free smoking cessation counseling, or talk to your primary care doctor for other options.   Take care,   Increase activity slowly   Complete by: As directed      Allergies as of 02/11/2020      Reactions   Iodine Shortness Of Breath, Rash   Shellfish Allergy    Causes rash      Medication List    TAKE these medications   amLODipine 10 MG tablet Commonly known as: NORVASC Take 1 tablet (10 mg total) by mouth daily. Start taking on: February 12, 2020   baclofen 10 MG tablet Commonly known as: LIORESAL Take 10 mg by mouth daily.   dicyclomine 20 MG tablet Commonly known as: BENTYL Take 20 mg by mouth every 6 (six) hours as needed for spasms.   lansoprazole 15 MG capsule Commonly known as: PREVACID Take 30 mg by mouth 2 (two) times daily as needed (Acid reflux).   nicotine 21 mg/24hr patch Commonly known as: NICODERM CQ - dosed in mg/24 hours Place 1 patch (21 mg total) onto the skin daily.   ondansetron 8 MG disintegrating tablet Commonly known as: ZOFRAN-ODT Take 8 mg by mouth every 8 (eight) hours as needed for nausea or vomiting.   sodium chloride 0.65 % Soln nasal spray Commonly known as: OCEAN Place 2 sprays into both nostrils as needed for congestion.   sodium chloride 1 g tablet Take 1 tablet (1 g total) by mouth 2 (two) times daily with a meal.   traMADol 50 MG tablet Commonly known as: ULTRAM Take 50 mg by mouth every 6 (six) hours as needed for moderate pain.   traZODone 50 MG tablet Commonly known as: DESYREL Take 50 mg by mouth at bedtime.       Consultations:  Nephrology  Procedures/Studies:   CT Head Wo Contrast  Result Date: 02/03/2020 CLINICAL DATA:  Headache, posttraumatic. Additional history provided: Hyponatremia, several  instances of syncope, dizziness EXAM: CT HEAD WITHOUT CONTRAST TECHNIQUE: Contiguous axial images were obtained from the base of the skull through the vertex without intravenous contrast. COMPARISON:  Head CT 05/25/2011 FINDINGS: Brain: There is no evidence of acute intracranial hemorrhage, intracranial mass, midline shift or extra-axial fluid collection.No demarcated cortical infarction. Mild ill-defined hypoattenuation within the cerebral white matter is nonspecific, but consistent with chronic small vessel ischemic disease. Mild generalized parenchymal atrophy. Vascular: No hyperdense vessel.  Atherosclerotic calcifications Skull: Normal. Negative for fracture or focal lesion. Sinuses/Orbits: Visualized orbits demonstrate no acute abnormality. No significant paranasal sinus disease. Trace fluid within right mastoid air cells IMPRESSION: No evidence of acute intracranial abnormality. Mild generalized parenchymal atrophy and chronic small vessel ischemic disease. Trace right mastoid effusion. Electronically Signed   By: Kellie Simmering DO   On: 02/03/2020 14:23   CT CHEST WO CONTRAST  Result Date: 02/10/2020 CLINICAL DATA:  Smoking history, weight loss, chronic hyponatremia EXAM: CT CHEST WITHOUT CONTRAST  TECHNIQUE: Multidetector CT imaging of the chest was performed following the standard protocol without IV contrast. COMPARISON:  Same-day chest radiographs FINDINGS: Cardiovascular: Aortic atherosclerosis. Normal heart size. No pericardial effusion. Mediastinum/Nodes: No enlarged mediastinal, hilar, or axillary lymph nodes. Large hiatal hernia, with intrathoracic position of the gastric body and fundus. Thyroid gland, trachea, and esophagus demonstrate no significant findings. Lungs/Pleura: Minimal centrilobular emphysema. Bandlike scarring or atelectasis of the bilateral lower lungs. No pleural effusion or pneumothorax. Upper Abdomen: No acute abnormality.  Hepatic steatosis. Musculoskeletal: No chest wall mass  or suspicious bone lesions identified. Age indeterminate high-grade wedge deformity of T8. Status post right shoulder arthroplasty. IMPRESSION: 1.  Minimal emphysema.  Emphysema (ICD10-J43.9). 2.  Bandlike scarring or atelectasis of the bilateral lower lungs. 3.  Large hiatal hernia. 4.  Hepatic steatosis. 5.  Aortic Atherosclerosis (ICD10-I70.0). Electronically Signed   By: Eddie Candle M.D.   On: 02/10/2020 15:33   DG CHEST PORT 1 VIEW  Result Date: 02/10/2020 CLINICAL DATA:  Hyponatremia EXAM: PORTABLE CHEST 1 VIEW COMPARISON:  05/25/2011 FINDINGS: Borderline heart size with aortic tortuosity. There is a longstanding lower mediastinal mass, usually hiatal hernia. Mild reticular density at the right base. There is no edema, consolidation, effusion, or pneumothorax. Mild scoliosis. Right shoulder replacement IMPRESSION: 1. Minimal atelectatic opacity. 2. Probable hiatal hernia. Electronically Signed   By: Monte Fantasia M.D.   On: 02/10/2020 04:51       Discharge Exam: Vitals:   02/10/20 2350 02/11/20 0450  BP: 139/89 (!) 141/84  Pulse: 85 76  Resp: 18 18  Temp: 97.7 F (36.5 C) 98.2 F (36.8 C)  SpO2: 96% 95%    GENERAL: No acute distress.  Appears well.  HEENT: MMM.  Vision and hearing grossly intact.  NECK: Supple.  No apparent JVD.  RESP:  No IWOB. Good air movement bilaterally. CVS:  RRR. Heart sounds normal.  ABD/GI/GU: Bowel sounds present. Soft. Non tender.  MSK/EXT:  Moves extremities. No apparent deformity or edema.  SKIN: no apparent skin lesion or wound NEURO: Awake, alert and oriented appropriately.  No apparent focal neuro deficit. PSYCH: Calm. Normal affect.    The results of significant diagnostics from this hospitalization (including imaging, microbiology, ancillary and laboratory) are listed below for reference.     Microbiology: Recent Results (from the past 240 hour(s))  SARS CORONAVIRUS 2 (TAT 6-24 HRS) Nasopharyngeal Nasopharyngeal Swab     Status: None    Collection Time: 02/03/20  3:54 PM   Specimen: Nasopharyngeal Swab  Result Value Ref Range Status   SARS Coronavirus 2 NEGATIVE NEGATIVE Final    Comment: (NOTE) SARS-CoV-2 target nucleic acids are NOT DETECTED. The SARS-CoV-2 RNA is generally detectable in upper and lower respiratory specimens during the acute phase of infection. Negative results do not preclude SARS-CoV-2 infection, do not rule out co-infections with other pathogens, and should not be used as the sole basis for treatment or other patient management decisions. Negative results must be combined with clinical observations, patient history, and epidemiological information. The expected result is Negative. Fact Sheet for Patients: SugarRoll.be Fact Sheet for Healthcare Providers: https://www.woods-mathews.com/ This test is not yet approved or cleared by the [place] FDA and  has been authorized for detection and/or diagnosis of SARS-CoV-2 by FDA under an Emergency Use Authorization (EUA). This EUA will remain  in effect (meaning this test can be used) for the duration of the COVID-19 declaration under Section 56 4(b)(1) of the Act, 21 U.S.C. section 360bbb-3(b)(1), unless the authorization is  terminated or revoked sooner. Performed at [facility], [place] [address], [place], [place] [address]   SARS CORONAVIRUS 2 (TAT 6-24 HRS) Nasopharyngeal Nasopharyngeal Swab     Status: None   Collection Time: 02/10/20  4:38 AM   Specimen: Nasopharyngeal Swab  Result Value Ref Range Status   SARS Coronavirus 2 NEGATIVE NEGATIVE Final    Comment: (NOTE) SARS-CoV-2 target nucleic acids are NOT DETECTED. The SARS-CoV-2 RNA is generally detectable in upper and lower respiratory specimens during the acute phase of infection. Negative results do not preclude SARS-CoV-2 infection, do not rule out co-infections with other pathogens, and should not be used as the sole basis for  treatment or other patient management decisions. Negative results must be combined with clinical observations, patient history, and epidemiological information. The expected result is Negative. Fact Sheet for Patients: SugarRoll.be Fact Sheet for Healthcare Providers: https://www.woods-mathews.com/ This test is not yet approved or cleared by the [place] FDA and  has been authorized for detection and/or diagnosis of SARS-CoV-2 by FDA under an Emergency Use Authorization (EUA). This EUA will remain  in effect (meaning this test can be used) for the duration of the COVID-19 declaration under Section 56 4(b)(1) of the Act, 21 U.S.C. section 360bbb-3(b)(1), unless the authorization is terminated or revoked sooner. Performed at [facility], [place] [address], [place], [place] [address]      Labs: BNP (last 3 results) Recent Labs    02/10/20 0756  BNP [id number]*   Basic Metabolic Panel: Recent Labs  Lab 02/09/20 1738 02/10/20 0438 02/10/20 0756 02/10/20 1525 02/10/20 2359  NA 121* 119* 123* 129* 133*  K 3.8 3.9 3.9 3.8 3.5  CL 81* 83* 85* 92* 96*  CO2 27 27 25 26 26   GLUCOSE 101* 81 80 97 92  BUN 14 12 12 11 10   CREATININE 0.95 1.00 0.82 0.77 0.70  CALCIUM 8.8* 8.9 8.7* 8.5* 8.4*  PHOS  --   --   --   --  2.8   Liver Function Tests: Recent Labs  Lab 02/10/20 2359  ALBUMIN 2.9*   No results for input(s): LIPASE, AMYLASE in the last 168 hours. No results for input(s): AMMONIA in the last 168 hours. CBC: Recent Labs  Lab 02/09/20 1738  WBC 12.2*  HGB 11.6*  HCT 33.6*  MCV 89.1  PLT 411*   Cardiac Enzymes: No results for input(s): CKTOTAL, CKMB, CKMBINDEX, TROPONINI in the last 168 hours. BNP: Invalid input(s): POCBNP CBG: Recent Labs  Lab 02/04/20 1120  GLUCAP 91   D-Dimer No results for input(s): DDIMER in the last 72 hours. Hgb A1c No results for input(s): HGBA1C in the last 72 hours. Lipid Profile No  results for input(s): CHOL, HDL, LDLCALC, TRIG, CHOLHDL, LDLDIRECT in the last 72 hours. Thyroid function studies Recent Labs    02/10/20 1039  TSH 1.028   Anemia work up No results for input(s): VITAMINB12, FOLATE, FERRITIN, TIBC, IRON, RETICCTPCT in the last 72 hours. Urinalysis    Component Value Date/Time   COLORURINE STRAW (A) 02/03/2020 1608   APPEARANCEUR CLEAR 02/03/2020 1608   LABSPEC 1.008 02/03/2020 1608   PHURINE 6.0 02/03/2020 1608   GLUCOSEU NEGATIVE 02/03/2020 1608   HGBUR NEGATIVE 02/03/2020 1608   BILIRUBINUR NEGATIVE 02/03/2020 1608   KETONESUR NEGATIVE 02/03/2020 1608   PROTEINUR NEGATIVE 02/03/2020 1608   UROBILINOGEN 0.2 05/25/2011 1337   NITRITE NEGATIVE 02/03/2020 1608   LEUKOCYTESUR NEGATIVE 02/03/2020 1608   Sepsis Labs Invalid input(s): PROCALCITONIN,  WBC,  LACTICIDVEN   Time coordinating discharge: 25 minutes  SIGNED:  Mercy Riding, MD  [facility] 02/11/2020, 10:24 AM  If 7PM-7AM, please contact night-coverage [email or web address] Password [email or web address]

## 2020-05-18 ENCOUNTER — Other Ambulatory Visit: Payer: Self-pay

## 2020-05-18 ENCOUNTER — Emergency Department (HOSPITAL_COMMUNITY)
Admission: EM | Admit: 2020-05-18 | Discharge: 2020-05-18 | Discharge status: Against Medical Advice | Payer: Medicare Other | Source: Home / Self Care

## 2020-05-18 ENCOUNTER — Encounter (HOSPITAL_COMMUNITY): Payer: Self-pay

## 2020-05-18 ENCOUNTER — Inpatient Hospital Stay (HOSPITAL_COMMUNITY)
Admission: EM | Admit: 2020-05-18 | Discharge: 2020-05-20 | DRG: 641 | Disposition: A | Discharge status: Home Health | Payer: Medicare Other | Attending: Internal Medicine | Admitting: Internal Medicine

## 2020-05-18 DIAGNOSIS — R0982 Postnasal drip: Secondary | ICD-10-CM | POA: Diagnosis present

## 2020-05-18 DIAGNOSIS — T502X5A Adverse effect of carbonic-anhydrase inhibitors, benzothiadiazides and other diuretics, initial encounter: Secondary | ICD-10-CM | POA: Diagnosis present

## 2020-05-18 DIAGNOSIS — Z800 Family history of malignant neoplasm of digestive organs: Secondary | ICD-10-CM

## 2020-05-18 DIAGNOSIS — S0083XA Contusion of other part of head, initial encounter: Secondary | ICD-10-CM | POA: Diagnosis present

## 2020-05-18 DIAGNOSIS — K219 Gastro-esophageal reflux disease without esophagitis: Secondary | ICD-10-CM | POA: Diagnosis present

## 2020-05-18 DIAGNOSIS — W19XXXA Unspecified fall, initial encounter: Secondary | ICD-10-CM | POA: Diagnosis present

## 2020-05-18 DIAGNOSIS — E871 Hypo-osmolality and hyponatremia: Secondary | ICD-10-CM | POA: Diagnosis not present

## 2020-05-18 DIAGNOSIS — Z23 Encounter for immunization: Secondary | ICD-10-CM

## 2020-05-18 DIAGNOSIS — E876 Hypokalemia: Secondary | ICD-10-CM

## 2020-05-18 DIAGNOSIS — Z681 Body mass index (BMI) 19 or less, adult: Secondary | ICD-10-CM

## 2020-05-18 DIAGNOSIS — Z8042 Family history of malignant neoplasm of prostate: Secondary | ICD-10-CM

## 2020-05-18 DIAGNOSIS — Z888 Allergy status to other drugs, medicaments and biological substances status: Secondary | ICD-10-CM

## 2020-05-18 DIAGNOSIS — R531 Weakness: Secondary | ICD-10-CM

## 2020-05-18 DIAGNOSIS — I10 Essential (primary) hypertension: Secondary | ICD-10-CM | POA: Diagnosis present

## 2020-05-18 DIAGNOSIS — R634 Abnormal weight loss: Secondary | ICD-10-CM | POA: Diagnosis present

## 2020-05-18 DIAGNOSIS — E861 Hypovolemia: Principal | ICD-10-CM | POA: Diagnosis present

## 2020-05-18 DIAGNOSIS — Z90710 Acquired absence of both cervix and uterus: Secondary | ICD-10-CM

## 2020-05-18 DIAGNOSIS — Z20822 Contact with and (suspected) exposure to covid-19: Secondary | ICD-10-CM | POA: Diagnosis present

## 2020-05-18 DIAGNOSIS — Z96611 Presence of right artificial shoulder joint: Secondary | ICD-10-CM | POA: Diagnosis present

## 2020-05-18 DIAGNOSIS — Z79899 Other long term (current) drug therapy: Secondary | ICD-10-CM

## 2020-05-18 DIAGNOSIS — Y929 Unspecified place or not applicable: Secondary | ICD-10-CM

## 2020-05-18 DIAGNOSIS — Z91013 Allergy to seafood: Secondary | ICD-10-CM

## 2020-05-18 DIAGNOSIS — Z818 Family history of other mental and behavioral disorders: Secondary | ICD-10-CM

## 2020-05-18 DIAGNOSIS — F17210 Nicotine dependence, cigarettes, uncomplicated: Secondary | ICD-10-CM | POA: Diagnosis present

## 2020-05-18 DIAGNOSIS — Z720 Tobacco use: Secondary | ICD-10-CM | POA: Diagnosis present

## 2020-05-18 LAB — CBC
HCT: 34.1 % — ABNORMAL LOW (ref 36.0–46.0)
Hemoglobin: 12.1 g/dL (ref 12.0–15.0)
MCH: 30.6 pg (ref 26.0–34.0)
MCHC: 35.5 g/dL (ref 30.0–36.0)
MCV: 86.1 fL (ref 80.0–100.0)
Platelets: 335 10*3/uL (ref 150–400)
RBC: 3.96 MIL/uL (ref 3.87–5.11)
RDW: 12.9 % (ref 11.5–15.5)
WBC: 9.7 10*3/uL (ref 4.0–10.5)
nRBC: 0 % (ref 0.0–0.2)

## 2020-05-18 LAB — COMPREHENSIVE METABOLIC PANEL
ALT: 50 U/L — ABNORMAL HIGH (ref 0–44)
AST: 58 U/L — ABNORMAL HIGH (ref 15–41)
Albumin: 3.4 g/dL — ABNORMAL LOW (ref 3.5–5.0)
Alkaline Phosphatase: 95 U/L (ref 38–126)
Anion gap: 12 (ref 5–15)
BUN: 20 mg/dL (ref 8–23)
CO2: 31 mmol/L (ref 22–32)
Calcium: 8.6 mg/dL — ABNORMAL LOW (ref 8.9–10.3)
Chloride: 76 mmol/L — ABNORMAL LOW (ref 98–111)
Creatinine, Ser: 1.16 mg/dL — ABNORMAL HIGH (ref 0.44–1.00)
GFR calc Af Amer: 57 mL/min — ABNORMAL LOW (ref >60)
GFR calc non Af Amer: 49 mL/min — ABNORMAL LOW (ref >60)
Glucose, Bld: 116 mg/dL — ABNORMAL HIGH (ref 70–99)
Potassium: 3 mmol/L — ABNORMAL LOW (ref 3.5–5.1)
Sodium: 119 mmol/L — CL (ref 135–145)
Total Bilirubin: 1 mg/dL (ref 0.3–1.2)
Total Protein: 5.5 g/dL — ABNORMAL LOW (ref 6.5–8.1)

## 2020-05-18 MED ORDER — ONDANSETRON HCL 4 MG/2ML IJ SOLN
4.0000 mg | Freq: Once | INTRAMUSCULAR | Status: AC
  Administered 2020-05-19: 4 mg via INTRAVENOUS
  Filled 2020-05-18: qty 2

## 2020-05-18 MED ORDER — SODIUM CHLORIDE 0.9 % IV BOLUS
500.0000 mL | Freq: Once | INTRAVENOUS | Status: AC
  Administered 2020-05-19: 500 mL via INTRAVENOUS

## 2020-05-18 MED ORDER — TETANUS-DIPHTH-ACELL PERTUSSIS 5-2.5-18.5 LF-MCG/0.5 IM SUSP
0.5000 mL | Freq: Once | INTRAMUSCULAR | Status: AC
  Administered 2020-05-18: 0.5 mL via INTRAMUSCULAR
  Filled 2020-05-18: qty 0.5

## 2020-05-18 NOTE — ED Triage Notes (Signed)
Pt BIB [facility] for eval of low sodium. Pt reports that she rec'd a call from lab today informing her she has critically low Na and to present here. Pt was here POV earlier, was informed there was an 8 hour wait- so she left and activated EMS from home. Pt also endorses falls, weakness. No thinners

## 2020-05-18 NOTE — ED Provider Notes (Signed)
[place] [facility] EMERGENCY DEPARTMENT Provider Note   CSN: [id number] Arrival date & time: 05/18/20  1847     History Chief Complaint  Patient presents with  . Abnormal Lab  . Fall    [name] is a 69 y.o. female.  HPI   69 year old female with a history of GERD, heart murmur, hiatal hernia, hypertension, hyponatremia, Schatzki's ring, who presents to the emergency department today for evaluation of abnormal labs.  Patient states that she had outpatient labs completed by her PCP today and was told that her sodium was critically low and was told to come to the ED.  States she did not actually see her PCP today.  States that she has been feeling somewhat weak recently and had a fall about 2 days ago where she hit her head on her bathroom countertop.  She did not lose consciousness.  She has had some vomiting since then that she attributes to postnasal drip.  She denies any visual changes or unilateral numbness/weakness.  She denies any neck pain or other areas of pain currently.  She does have abrasion to the left lower extremity.  She denies any chest pain.  States that she has been admitted several times for hyponatremia and it was thought to be due to her hydrochlorothiazide.  She was discontinued on this and started on metoprolol however she did not tolerate metoprolol well and switched herself back to her this bisoprolol/HCTZ 4 days ago.  Past Medical History:  Diagnosis Date  . Abdominal pain, epigastric   . Acid reflux    Pt was born with acute acid  . GERD (gastroesophageal reflux disease)   . Heart murmur   . Hiatal hernia   . Hypertension   . Hyponatremia 02/10/2020  . Schatzki's ring   . Ulcer    8 years ago    Patient Active Problem List   Diagnosis Date Noted  . Hyponatremia 02/03/2020  . Ambulatory dysfunction   . Prediabetes 03/23/2013  . Hyperlipidemia 03/23/2013  . Colon cancer screening 11/18/2012  . Hypertension 09/16/2012  . GERD  (gastroesophageal reflux disease) 09/16/2012  . Tobacco use 09/16/2012    Past Surgical History:  Procedure Laterality Date  . ABDOMINAL HYSTERECTOMY     complete for fibroids  . KNEE ARTHROSCOPY     right knee  . shoulder repalcement     right  . TONSILLECTOMY AND ADENOIDECTOMY       OB History   No obstetric history on file.     Family History  Problem Relation Age of Onset  . Prostate cancer Father   . Anxiety disorder Mother   . Colon cancer Maternal Grandmother     Social History   Tobacco Use  . Smoking status: Current Every Day Smoker    Packs/day: 0.50    Types: Cigarettes  . Smokeless tobacco: Never Used  . Tobacco comment: almost a pack a day;   Vaping Use  . Vaping Use: Never used  Substance Use Topics  . Alcohol use: No    Alcohol/week: 0.0 standard drinks  . Drug use: No    Home Medications Prior to Admission medications   Medication Sig Start Date End Date Taking? Authorizing Provider  baclofen (LIORESAL) 10 MG tablet Take 10 mg by mouth daily.   Yes [provider]  bisoprolol-hydrochlorothiazide (ZIAC) 10-6.25 MG tablet Take 1 tablet by mouth daily.   Yes [provider]  lisinopril (ZESTRIL) 40 MG tablet Take 40 mg by mouth  daily. 04/29/20  Yes [provider]  sodium chloride 1 g tablet Take 1 tablet (1 g total) by mouth 2 (two) times daily with a meal. 02/11/20  Yes Gonfa, Taye T, MD  amLODipine (NORVASC) 10 MG tablet Take 1 tablet (10 mg total) by mouth daily. Patient not taking: Reported on 05/19/2020 02/12/20   Mercy Riding, MD    Allergies    Iodine and Shellfish allergy  Review of Systems   Review of Systems  Constitutional: Negative for chills and fever.  HENT: Positive for postnasal drip. Negative for ear pain and sore throat.   Eyes: Negative for visual disturbance.  Respiratory: Positive for cough (chronic x1 year). Negative for shortness of breath.   Cardiovascular: Negative for chest pain.    Gastrointestinal: Positive for nausea and vomiting. Negative for abdominal pain.  Genitourinary: Negative for dysuria and hematuria.  Musculoskeletal: Negative for arthralgias and back pain.  Skin: Positive for wound.  Neurological: Positive for weakness. Negative for dizziness, light-headedness, numbness and headaches.       Head injury, no LOC  All other systems reviewed and are negative.   Physical Exam Updated Vital Signs BP (!) 157/102   Pulse 62   Temp 97.7 F (36.5 C) (Oral)   Resp 17   Ht 5\' 2"  (1.575 m)   Wt 42 kg   SpO2 91%   BMI 16.94 kg/m   Physical Exam Vitals and nursing note reviewed.  Constitutional:      General: She is not in acute distress.    Appearance: She is well-developed.  HENT:     Head: Normocephalic.     Comments: Ecchymosis to forehead, nose and bilat eyes. Mild TTP over the forehead and nasal bridge.     Nose:     Comments: No nasal septal hematoma or epistaxis Eyes:     Extraocular Movements: Extraocular movements intact.     Conjunctiva/sclera: Conjunctivae normal.     Pupils: Pupils are equal, round, and reactive to light.  Cardiovascular:     Rate and Rhythm: Normal rate and regular rhythm.     Heart sounds: Normal heart sounds. No murmur heard.   Pulmonary:     Effort: Pulmonary effort is normal. No respiratory distress.     Breath sounds: Normal breath sounds. No wheezing, rhonchi or rales.  Abdominal:     General: Bowel sounds are normal.     Palpations: Abdomen is soft.     Tenderness: There is no abdominal tenderness. There is no guarding or rebound.  Musculoskeletal:     Cervical back: Neck supple.     Comments: No midline cervical, thoracic, or lumbar TTP.  No TTP to the left tib/fib.  Skin:    General: Skin is warm and dry.     Comments: Superficial abrasion to left shin without any surrounding signs of infection.  Neurological:     Mental Status: She is alert.     Comments: Mental Status:  Alert, thought content  appropriate, able to give a coherent history. Speech fluent without evidence of aphasia. Able to follow 2 step commands without difficulty.  Cranial Nerves:  II:   pupils equal, round, reactive to light III,IV, VI: ptosis not present, extra-ocular motions intact bilaterally  V,VII: smile symmetric, facial light touch sensation equal VIII: hearing grossly normal to voice  X: uvula elevates symmetrically  XI: bilateral shoulder shrug symmetric and strong XII: midline tongue extension without fassiculations Motor:  Normal tone. 5/5 strength of BUE and BLE  major muscle groups including strong and equal grip strength and dorsiflexion/plantar flexion Sensory: light touch normal in all extremities.      ED Results / Procedures / Treatments   Labs (all labs ordered are listed, but only abnormal results are displayed) Labs Reviewed  COMPREHENSIVE METABOLIC PANEL - Abnormal; Notable for the following components:      Result Value   Sodium 119 (*)    Potassium 3.0 (*)    Chloride 76 (*)    Glucose, Bld 116 (*)    Creatinine, Ser 1.16 (*)    Calcium 8.6 (*)    Total Protein 5.5 (*)    Albumin 3.4 (*)    AST 58 (*)    ALT 50 (*)    GFR calc non Af Amer 49 (*)    GFR calc Af Amer 57 (*)    All other components within normal limits  CBC - Abnormal; Notable for the following components:   HCT 34.1 (*)    All other components within normal limits  MAGNESIUM - Abnormal; Notable for the following components:   Magnesium 1.3 (*)    All other components within normal limits  SARS CORONAVIRUS 2 BY RT PCR (HOSPITAL ORDER, [facility])  SODIUM, URINE, RANDOM  OSMOLALITY, URINE    EKG EKG Interpretation  Date/Time:  Wednesday May 18 2020 18:51:05 EDT Ventricular Rate:  62 PR Interval:  146 QRS Duration: 78 QT Interval:  448 QTC Calculation: 454 R Axis:   66 Text Interpretation: Sinus rhythm with Premature supraventricular complexes Septal infarct , age  undetermined Possible Lateral infarct , age undetermined Abnormal ECG Confirmed by Thayer Jew [phone]) on 05/19/2020 12:44:01 AM   Radiology CT Head Wo Contrast  Result Date: 05/19/2020 CLINICAL DATA:  Fall with right facial bruising EXAM: CT HEAD WITHOUT CONTRAST CT MAXILLOFACIAL WITHOUT CONTRAST TECHNIQUE: Multidetector CT imaging of the head and maxillofacial structures were performed using the standard protocol without intravenous contrast. Multiplanar CT image reconstructions of the maxillofacial structures were also generated. COMPARISON:  02/03/2020 FINDINGS: CT HEAD FINDINGS Brain: There is no mass, hemorrhage or extra-axial collection. The size and configuration of the ventricles and extra-axial CSF spaces are normal. There is hypoattenuation of the periventricular white matter, most commonly indicating chronic ischemic microangiopathy. Vascular: No hyperdense vessel or unexpected vascular calcification. Skull: The visualized skull base, calvarium and extracranial soft tissues are normal. CT MAXILLOFACIAL FINDINGS Osseous: --Complex facial fracture types: No LeFort, zygomaticomaxillary complex or nasoorbitoethmoidal fracture. --Simple fracture types: None. --Mandible, hard palate and teeth: No acute abnormality. Orbits: The globes and optic nerves are intact. Normal extraocular muscles and intraorbital fat. Sinuses: No acute finding. Soft tissues: Normal visualized extracranial soft tissues. IMPRESSION: 1. Chronic ischemic microangiopathy without acute intracranial abnormality. 2. No facial fracture. Electronically Signed   By: Ulyses Jarred M.D.   On: 05/19/2020 00:52   CT Maxillofacial Wo Contrast  Result Date: 05/19/2020 CLINICAL DATA:  Fall with right facial bruising EXAM: CT HEAD WITHOUT CONTRAST CT MAXILLOFACIAL WITHOUT CONTRAST TECHNIQUE: Multidetector CT imaging of the head and maxillofacial structures were performed using the standard protocol without intravenous contrast. Multiplanar CT  image reconstructions of the maxillofacial structures were also generated. COMPARISON:  02/03/2020 FINDINGS: CT HEAD FINDINGS Brain: There is no mass, hemorrhage or extra-axial collection. The size and configuration of the ventricles and extra-axial CSF spaces are normal. There is hypoattenuation of the periventricular white matter, most commonly indicating chronic ischemic microangiopathy. Vascular: No hyperdense vessel or unexpected vascular calcification.  Skull: The visualized skull base, calvarium and extracranial soft tissues are normal. CT MAXILLOFACIAL FINDINGS Osseous: --Complex facial fracture types: No LeFort, zygomaticomaxillary complex or nasoorbitoethmoidal fracture. --Simple fracture types: None. --Mandible, hard palate and teeth: No acute abnormality. Orbits: The globes and optic nerves are intact. Normal extraocular muscles and intraorbital fat. Sinuses: No acute finding. Soft tissues: Normal visualized extracranial soft tissues. IMPRESSION: 1. Chronic ischemic microangiopathy without acute intracranial abnormality. 2. No facial fracture. Electronically Signed   By: Ulyses Jarred M.D.   On: 05/19/2020 00:52    Procedures Procedures (including critical care time)  CRITICAL CARE Performed by: Rodney Booze   Total critical care time: 35 minutes  Critical care time was exclusive of separately billable procedures and treating other patients.  Critical care was necessary to treat or prevent imminent or life-threatening deterioration.  Critical care was time spent personally by me on the following activities: development of treatment plan with patient and/or surrogate as well as nursing, discussions with consultants, evaluation of patient's response to treatment, examination of patient, obtaining history from patient or surrogate, ordering and performing treatments and interventions, ordering and review of laboratory studies, ordering and review of radiographic studies, pulse oximetry  and re-evaluation of patient's condition.   Medications Ordered in ED Medications  magnesium sulfate IVPB 2 g 50 mL (has no administration in time range)  Tdap (BOOSTRIX) injection 0.5 mL (0.5 mLs Intramuscular Given 05/18/20 2324)  sodium chloride 0.9 % bolus 500 mL (0 mLs Intravenous Stopped 05/19/20 0043)  ondansetron (ZOFRAN) injection 4 mg (4 mg Intravenous Given 05/19/20 0000)  potassium chloride SA (KLOR-CON) CR tablet 40 mEq (40 mEq Oral Given 05/19/20 0044)    ED Course  I have reviewed the triage vital signs and the nursing notes.  Pertinent labs & imaging results that were available during my care of the patient were reviewed by me and considered in my medical decision making (see chart for details).    MDM Rules/Calculators/A&P                         69 year old female presenting for evaluation after she had outpatient labs completed showing hyponatremia.  Has history of same.  Was previously on HCTZ and was discontinued on it because of this however she restarted herself on this medication 4 days ago.  Has had fall at home with head trauma and has had a couple of episodes of vomiting since.  Denies other neuro complaints or other injuries.  Reviewed/interpreted labs CBC is without leukocytosis or anemia CMP with hyponatremia, hypokalemia and hypochloremia.  Creatinine is slightly elevated 1.16 compared to prior.  LFTs are slightly elevated.  Normal anion gap.  -k dur given  - small fluid bolus given for hyponatremia as this is likely 2/2 hypovolemia from her diuretic Magnesium also slightly low  - iv mag given Covid test negative  Urine osmolality and sodium urine pending at the time of admission  EKG Sinus rhythm with Premature supraventricular complexes Septal infarct , age undetermined Possible Lateral infarct , age undetermined Abnormal ECG   - denies chest pain/sob  CT head/maxillofacial reviewed/interpreted -  Agree with radiologist. Chronic ischemic microangiopathy  without acute intracranial abnormality or intracranial bleeding. No facial fracture.  1:03 AM CONSULT with Dr. Tonie Griffith with [facility] who accepts patient for admission.   Final Clinical Impression(s) / ED Diagnoses Final diagnoses:  Hyponatremia    Rx / DC Orders ED Discharge Orders    None  Rodney Booze, PA-C 05/19/20 5423    Merryl Hacker, MD 05/19/20 0330

## 2020-05-19 ENCOUNTER — Emergency Department (HOSPITAL_COMMUNITY): Payer: Medicare Other

## 2020-05-19 ENCOUNTER — Encounter (HOSPITAL_COMMUNITY): Payer: Self-pay | Admitting: Family Medicine

## 2020-05-19 DIAGNOSIS — E861 Hypovolemia: Secondary | ICD-10-CM | POA: Diagnosis present

## 2020-05-19 DIAGNOSIS — E871 Hypo-osmolality and hyponatremia: Secondary | ICD-10-CM

## 2020-05-19 DIAGNOSIS — Z90710 Acquired absence of both cervix and uterus: Secondary | ICD-10-CM | POA: Diagnosis not present

## 2020-05-19 DIAGNOSIS — S0083XA Contusion of other part of head, initial encounter: Secondary | ICD-10-CM | POA: Diagnosis present

## 2020-05-19 DIAGNOSIS — Z681 Body mass index (BMI) 19 or less, adult: Secondary | ICD-10-CM | POA: Diagnosis not present

## 2020-05-19 DIAGNOSIS — K219 Gastro-esophageal reflux disease without esophagitis: Secondary | ICD-10-CM | POA: Diagnosis present

## 2020-05-19 DIAGNOSIS — I10 Essential (primary) hypertension: Secondary | ICD-10-CM | POA: Diagnosis present

## 2020-05-19 DIAGNOSIS — Z20822 Contact with and (suspected) exposure to covid-19: Secondary | ICD-10-CM | POA: Diagnosis present

## 2020-05-19 DIAGNOSIS — R531 Weakness: Secondary | ICD-10-CM

## 2020-05-19 DIAGNOSIS — Y929 Unspecified place or not applicable: Secondary | ICD-10-CM | POA: Diagnosis not present

## 2020-05-19 DIAGNOSIS — Z79899 Other long term (current) drug therapy: Secondary | ICD-10-CM | POA: Diagnosis not present

## 2020-05-19 DIAGNOSIS — E876 Hypokalemia: Secondary | ICD-10-CM | POA: Diagnosis present

## 2020-05-19 DIAGNOSIS — R0982 Postnasal drip: Secondary | ICD-10-CM | POA: Diagnosis present

## 2020-05-19 DIAGNOSIS — Z8042 Family history of malignant neoplasm of prostate: Secondary | ICD-10-CM | POA: Diagnosis not present

## 2020-05-19 DIAGNOSIS — Z800 Family history of malignant neoplasm of digestive organs: Secondary | ICD-10-CM | POA: Diagnosis not present

## 2020-05-19 DIAGNOSIS — Z23 Encounter for immunization: Secondary | ICD-10-CM | POA: Diagnosis present

## 2020-05-19 DIAGNOSIS — W19XXXA Unspecified fall, initial encounter: Secondary | ICD-10-CM | POA: Diagnosis present

## 2020-05-19 DIAGNOSIS — Z96611 Presence of right artificial shoulder joint: Secondary | ICD-10-CM | POA: Diagnosis present

## 2020-05-19 DIAGNOSIS — Z888 Allergy status to other drugs, medicaments and biological substances status: Secondary | ICD-10-CM | POA: Diagnosis not present

## 2020-05-19 DIAGNOSIS — T502X5A Adverse effect of carbonic-anhydrase inhibitors, benzothiadiazides and other diuretics, initial encounter: Secondary | ICD-10-CM | POA: Diagnosis present

## 2020-05-19 DIAGNOSIS — F17210 Nicotine dependence, cigarettes, uncomplicated: Secondary | ICD-10-CM | POA: Diagnosis present

## 2020-05-19 DIAGNOSIS — Z91013 Allergy to seafood: Secondary | ICD-10-CM | POA: Diagnosis not present

## 2020-05-19 DIAGNOSIS — R634 Abnormal weight loss: Secondary | ICD-10-CM | POA: Diagnosis present

## 2020-05-19 DIAGNOSIS — Z818 Family history of other mental and behavioral disorders: Secondary | ICD-10-CM | POA: Diagnosis not present

## 2020-05-19 LAB — OSMOLALITY, URINE: Osmolality, Ur: 568 mOsm/kg (ref 300–900)

## 2020-05-19 LAB — CBC
HCT: 33.2 % — ABNORMAL LOW (ref 36.0–46.0)
Hemoglobin: 11.9 g/dL — ABNORMAL LOW (ref 12.0–15.0)
MCH: 31.1 pg (ref 26.0–34.0)
MCHC: 35.8 g/dL (ref 30.0–36.0)
MCV: 86.7 fL (ref 80.0–100.0)
Platelets: 329 10*3/uL (ref 150–400)
RBC: 3.83 MIL/uL — ABNORMAL LOW (ref 3.87–5.11)
RDW: 13.1 % (ref 11.5–15.5)
WBC: 13 10*3/uL — ABNORMAL HIGH (ref 4.0–10.5)
nRBC: 0 % (ref 0.0–0.2)

## 2020-05-19 LAB — SARS CORONAVIRUS 2 BY RT PCR (HOSPITAL ORDER, PERFORMED IN ~~LOC~~ HOSPITAL LAB): SARS Coronavirus 2: NEGATIVE

## 2020-05-19 LAB — BASIC METABOLIC PANEL
Anion gap: 10 (ref 5–15)
BUN: 12 mg/dL (ref 8–23)
CO2: 31 mmol/L (ref 22–32)
Calcium: 8.6 mg/dL — ABNORMAL LOW (ref 8.9–10.3)
Chloride: 82 mmol/L — ABNORMAL LOW (ref 98–111)
Creatinine, Ser: 1.05 mg/dL — ABNORMAL HIGH (ref 0.44–1.00)
GFR calc Af Amer: >60 mL/min (ref >60)
GFR calc non Af Amer: 55 mL/min — ABNORMAL LOW (ref >60)
Glucose, Bld: 81 mg/dL (ref 70–99)
Potassium: 4.1 mmol/L (ref 3.5–5.1)
Sodium: 123 mmol/L — ABNORMAL LOW (ref 135–145)

## 2020-05-19 LAB — MAGNESIUM: Magnesium: 1.3 mg/dL — ABNORMAL LOW (ref 1.7–2.4)

## 2020-05-19 LAB — TSH: TSH: 0.968 u[IU]/mL (ref 0.350–4.500)

## 2020-05-19 LAB — URIC ACID: Uric Acid, Serum: 3.7 mg/dL (ref 2.5–7.1)

## 2020-05-19 LAB — CORTISOL: Cortisol, Plasma: 20.4 ug/dL

## 2020-05-19 LAB — OSMOLALITY: Osmolality: 257 mOsm/kg — ABNORMAL LOW (ref 275–295)

## 2020-05-19 LAB — SODIUM, URINE, RANDOM: Sodium, Ur: 79 mmol/L

## 2020-05-19 MED ORDER — IPRATROPIUM-ALBUTEROL 0.5-2.5 (3) MG/3ML IN SOLN: 3.0000 mL | Freq: Four times a day (QID) | RESPIRATORY_TRACT | Status: DC | PRN

## 2020-05-19 MED ORDER — AMLODIPINE BESYLATE 10 MG PO TABS
10.0000 mg | ORAL_TABLET | Freq: Every day | ORAL | Status: DC
  Administered 2020-05-20: 10 mg via ORAL
  Filled 2020-05-19: qty 1
  Filled 2020-05-19: qty 2

## 2020-05-19 MED ORDER — BISOPROLOL FUMARATE 5 MG PO TABS
5.0000 mg | ORAL_TABLET | Freq: Every day | ORAL | Status: DC
  Administered 2020-05-20: 5 mg via ORAL
  Filled 2020-05-19 (×2): qty 1

## 2020-05-19 MED ORDER — POTASSIUM CHLORIDE CRYS ER 20 MEQ PO TBCR
40.0000 meq | EXTENDED_RELEASE_TABLET | Freq: Once | ORAL | Status: AC
  Administered 2020-05-19: 40 meq via ORAL
  Filled 2020-05-19: qty 2

## 2020-05-19 MED ORDER — MAGNESIUM SULFATE 2 GM/50ML IV SOLN
2.0000 g | Freq: Once | INTRAVENOUS | Status: AC
  Administered 2020-05-19: 2 g via INTRAVENOUS
  Filled 2020-05-19: qty 50

## 2020-05-19 MED ORDER — SODIUM CHLORIDE 1 G PO TABS
2.0000 g | ORAL_TABLET | Freq: Two times a day (BID) | ORAL | Status: DC
  Administered 2020-05-19 – 2020-05-20 (×2): 2 g via ORAL
  Filled 2020-05-19 (×5): qty 2

## 2020-05-19 MED ORDER — MAGNESIUM SULFATE 2 GM/50ML IV SOLN: 2.0000 g | Freq: Once | INTRAVENOUS | Status: DC

## 2020-05-19 MED ORDER — ENOXAPARIN SODIUM 40 MG/0.4ML ~~LOC~~ SOLN: 40.0000 mg | SUBCUTANEOUS | Status: DC

## 2020-05-19 MED ORDER — ACETAMINOPHEN 650 MG RE SUPP: 650.0000 mg | Freq: Four times a day (QID) | RECTAL | Status: DC | PRN

## 2020-05-19 MED ORDER — PROMETHAZINE HCL 25 MG PO TABS: 12.5000 mg | ORAL_TABLET | Freq: Four times a day (QID) | ORAL | Status: DC | PRN

## 2020-05-19 MED ORDER — POTASSIUM CHLORIDE 10 MEQ/100ML IV SOLN
10.0000 meq | INTRAVENOUS | Status: AC
  Administered 2020-05-19 (×4): 10 meq via INTRAVENOUS
  Filled 2020-05-19 (×4): qty 100

## 2020-05-19 MED ORDER — ACETAMINOPHEN 325 MG PO TABS: 650.0000 mg | ORAL_TABLET | Freq: Four times a day (QID) | ORAL | Status: DC | PRN

## 2020-05-19 MED ORDER — NICOTINE 14 MG/24HR TD PT24
14.0000 mg | MEDICATED_PATCH | Freq: Every day | TRANSDERMAL | Status: DC
  Filled 2020-05-19 (×2): qty 1

## 2020-05-19 MED ORDER — SODIUM CHLORIDE 0.9 % IV SOLN: INTRAVENOUS | Status: DC

## 2020-05-19 MED ORDER — ASPIRIN EC 81 MG PO TBEC
81.0000 mg | DELAYED_RELEASE_TABLET | Freq: Every day | ORAL | Status: DC
  Administered 2020-05-19 – 2020-05-20 (×2): 81 mg via ORAL
  Filled 2020-05-19 (×2): qty 1

## 2020-05-19 MED ORDER — TRAZODONE HCL 50 MG PO TABS
50.0000 mg | ORAL_TABLET | Freq: Once | ORAL | Status: AC
  Administered 2020-05-19: 50 mg via ORAL
  Filled 2020-05-19: qty 1

## 2020-05-19 MED ORDER — ENOXAPARIN SODIUM 30 MG/0.3ML ~~LOC~~ SOLN
30.0000 mg | SUBCUTANEOUS | Status: DC
  Administered 2020-05-19 – 2020-05-20 (×2): 30 mg via SUBCUTANEOUS
  Filled 2020-05-19 (×2): qty 0.3

## 2020-05-19 NOTE — Consult Note (Addendum)
Reason for Consult: Hyponatremia (acute on chronic) Referring Physician: Shelly Coss, MD [facility])  HPI:  69 year old Caucasian woman with past medical history significant for hypertension, gastroesophageal reflux disease and previous episode of hyponatremia secondary to HCTZ with last known sodium level of 132 in April, 2021 who was directed to the emergency room by her PCP with concerns of generalized weakness/fall and a sodium level of 119.  She reports some preceding nausea without any vomiting associated with a postnasal drip.  She reports that 4 days prior to admission, she restarted her bisoprolol/HCTZ combination tablet herself because she felt that her heart rate was running too high in the 70s and 80s (she is usually in the 50s).  She reports some recent weight loss of around 25 pounds over the past few months.  Chest x-ray from 3 months ago did not show any suspicious lesion.  Serum osmolality 257, urine osmolality 568, urine sodium 79, random cortisol 20.4, TSH 0.97.  Creatinine at baseline appears to be 0.7-0.8 and was elevated at 1.2 on admission with concomitant elevation of BUN.  Past Medical History:  Diagnosis Date  . Abdominal pain, epigastric   . Acid reflux    Pt was born with acute acid  . GERD (gastroesophageal reflux disease)   . Heart murmur   . Hiatal hernia   . Hypertension   . Hyponatremia 02/10/2020  . Schatzki's ring   . Ulcer    8 years ago    Past Surgical History:  Procedure Laterality Date  . ABDOMINAL HYSTERECTOMY     complete for fibroids  . KNEE ARTHROSCOPY     right knee  . shoulder repalcement     right  . TONSILLECTOMY AND ADENOIDECTOMY      Family History  Problem Relation Age of Onset  . Prostate cancer Father   . Anxiety disorder Mother   . Colon cancer Maternal Grandmother     Social History:  reports that she has been smoking cigarettes. She has been smoking about 0.50 packs per day. She has never used smokeless tobacco. She reports  that she does not drink alcohol and does not use drugs.  Allergies:  Allergies  Allergen Reactions  . Iodine Shortness Of Breath and Rash  . Shellfish Allergy     Causes rash    Medications:  Scheduled: . aspirin EC  81 mg Oral Daily  . enoxaparin (LOVENOX) injection  30 mg Subcutaneous Q24H  . nicotine  14 mg Transdermal Daily  . sodium chloride  2 g Oral BID WC    BMP Latest Ref Rng & Units 05/19/2020 05/18/2020 02/11/2020  Glucose 70 - 99 mg/dL 81 116(H) 100(H)  BUN 8 - 23 mg/dL 12 20 7(L)  Creatinine 0.44 - 1.00 mg/dL 1.05(H) 1.16(H) 0.78  Sodium 135 - 145 mmol/L 123(L) 119(LL) 132(L)  Potassium 3.5 - 5.1 mmol/L 4.1 3.0(L) 3.4(L)  Chloride 98 - 111 mmol/L 82(L) 76(L) 96(L)  CO2 22 - 32 mmol/L 31 31 28   Calcium 8.9 - 10.3 mg/dL 8.6(L) 8.6(L) 8.1(L)   CBC Latest Ref Rng & Units 05/19/2020 05/18/2020 02/09/2020  WBC 4.0 - 10.5 K/uL 13.0(H) 9.7 12.2(H)  Hemoglobin 12.0 - 15.0 g/dL 11.9(L) 12.1 11.6(L)  Hematocrit 36 - 46 % 33.2(L) 34.1(L) 33.6(L)  Platelets 150 - 400 K/uL 329 335 411(H)     CT Head Wo Contrast  Result Date: 05/19/2020 CLINICAL DATA:  Fall with right facial bruising EXAM: CT HEAD WITHOUT CONTRAST CT MAXILLOFACIAL WITHOUT CONTRAST TECHNIQUE: Multidetector CT imaging of  the head and maxillofacial structures were performed using the standard protocol without intravenous contrast. Multiplanar CT image reconstructions of the maxillofacial structures were also generated. COMPARISON:  02/03/2020 FINDINGS: CT HEAD FINDINGS Brain: There is no mass, hemorrhage or extra-axial collection. The size and configuration of the ventricles and extra-axial CSF spaces are normal. There is hypoattenuation of the periventricular white matter, most commonly indicating chronic ischemic microangiopathy. Vascular: No hyperdense vessel or unexpected vascular calcification. Skull: The visualized skull base, calvarium and extracranial soft tissues are normal. CT MAXILLOFACIAL FINDINGS Osseous: --Complex  facial fracture types: No LeFort, zygomaticomaxillary complex or nasoorbitoethmoidal fracture. --Simple fracture types: None. --Mandible, hard palate and teeth: No acute abnormality. Orbits: The globes and optic nerves are intact. Normal extraocular muscles and intraorbital fat. Sinuses: No acute finding. Soft tissues: Normal visualized extracranial soft tissues. IMPRESSION: 1. Chronic ischemic microangiopathy without acute intracranial abnormality. 2. No facial fracture. Electronically Signed   By: Ulyses Jarred M.D.   On: 05/19/2020 00:52   CT Maxillofacial Wo Contrast  Result Date: 05/19/2020 CLINICAL DATA:  Fall with right facial bruising EXAM: CT HEAD WITHOUT CONTRAST CT MAXILLOFACIAL WITHOUT CONTRAST TECHNIQUE: Multidetector CT imaging of the head and maxillofacial structures were performed using the standard protocol without intravenous contrast. Multiplanar CT image reconstructions of the maxillofacial structures were also generated. COMPARISON:  02/03/2020 FINDINGS: CT HEAD FINDINGS Brain: There is no mass, hemorrhage or extra-axial collection. The size and configuration of the ventricles and extra-axial CSF spaces are normal. There is hypoattenuation of the periventricular white matter, most commonly indicating chronic ischemic microangiopathy. Vascular: No hyperdense vessel or unexpected vascular calcification. Skull: The visualized skull base, calvarium and extracranial soft tissues are normal. CT MAXILLOFACIAL FINDINGS Osseous: --Complex facial fracture types: No LeFort, zygomaticomaxillary complex or nasoorbitoethmoidal fracture. --Simple fracture types: None. --Mandible, hard palate and teeth: No acute abnormality. Orbits: The globes and optic nerves are intact. Normal extraocular muscles and intraorbital fat. Sinuses: No acute finding. Soft tissues: Normal visualized extracranial soft tissues. IMPRESSION: 1. Chronic ischemic microangiopathy without acute intracranial abnormality. 2. No facial  fracture. Electronically Signed   By: Ulyses Jarred M.D.   On: 05/19/2020 00:52    Review of Systems  Constitutional: Positive for appetite change, fatigue and unexpected weight change. Negative for chills and fever.  HENT: Positive for congestion, postnasal drip and sinus pressure.   Eyes: Negative for photophobia, redness and visual disturbance.  Respiratory: Negative for cough and shortness of breath.   Cardiovascular: Negative for chest pain and leg swelling.  Gastrointestinal: Positive for nausea. Negative for abdominal distention, abdominal pain, blood in stool and diarrhea.  Endocrine: Negative.   Genitourinary: Negative for dysuria, flank pain, frequency and urgency.  Musculoskeletal: Negative for arthralgias, back pain and myalgias.  Skin: Positive for wound.       Skin tear left leg  Neurological: Positive for weakness and light-headedness.   Blood pressure (!) 122/111, pulse 63, temperature 97.7 F (36.5 C), temperature source Oral, resp. rate 16, height 5\' 2"  (1.575 m), weight 42 kg, SpO2 98 %. Physical Exam Vitals and nursing note reviewed.  Constitutional:      General: She is not in acute distress.    Appearance: Normal appearance. She is not toxic-appearing.  HENT:     Head: Normocephalic.     Comments: Bruising over forehead/right side of the face from recent fall.    Right Ear: External ear normal.     Left Ear: External ear normal.     Nose: Nose normal.  Mouth/Throat:     Mouth: Mucous membranes are dry.     Pharynx: Oropharynx is clear.  Eyes:     Extraocular Movements: Extraocular movements intact.     Conjunctiva/sclera: Conjunctivae normal.     Pupils: Pupils are equal, round, and reactive to light.  Cardiovascular:     Rate and Rhythm: Normal rate and regular rhythm.     Pulses: Normal pulses.     Heart sounds: Normal heart sounds.  Pulmonary:     Effort: Pulmonary effort is normal.     Breath sounds: Normal breath sounds.  Abdominal:      General: Abdomen is flat. Bowel sounds are normal.     Palpations: Abdomen is soft.  Musculoskeletal:        General: Signs of injury present. Normal range of motion.     Cervical back: Normal range of motion and neck supple.     Comments: Left pretibial skin bruising/tear  Skin:    General: Skin is warm and dry.     Capillary Refill: Capillary refill takes 2 to 3 seconds.     Findings: Bruising present.  Neurological:     General: No focal deficit present.     Mental Status: She is alert and oriented to person, place, and time.  Psychiatric:        Mood and Affect: Mood normal.     Assessment/Plan: 1.  Hyponatremia: This appears to be likely from recently started hydrochlorothiazide use which I have discussed with the patient not to restart any more as it will likely lead to recurrent episodes of clinically significant hyponatremia/adverse events.  Elevated creatinine/BUN suggest that this is likely hypovolemic hyponatremia (in spite of her elevated blood pressure) and should improve with gentle intravenous volume expansion with normal saline and stopping HCTZ with possible ADH activation by nausea; overnight sodium level has improved to 123.  Her recent weight loss raises some concern regarding her nutritional status/occult malignancy and this should be evaluated further as an outpatient with appropriate USPSTF cancer screening. 2.  Hypokalemia: Secondary to diuretic induced losses as well as concomitant hypomagnesemia, replace via oral route. 3.  Hypomagnesemia: Secondary to thiazide induced losses, replace via oral route. 4.  Hypertension: Restart amlodipine and low-dose beta-blocker-we will need cautious monitoring for bradycardia.  Resume lisinopril when sodium level improving/renal function back at baseline. 5.  Generalized weakness: Likely secondary to multiple electrolyte abnormalities and possibly volume contraction, monitor with correction of hyponatremia/hypokalemia and volume  status.   Shasta Chinn K. 05/19/2020, 1:11 PM

## 2020-05-19 NOTE — ED Notes (Signed)
Ordered breakfast--[name] 

## 2020-05-19 NOTE — Progress Notes (Signed)
Patient is a 69 year old female with history of hypertension, GERD who was sent by her PCP for the evaluation of low sodium.  Patient also reported that she had a fall at home and developed bruising on her forehead. Patient has been admitted twice in last few months for the evaluation of hyponatremia and on both occasions, hydrochlorothiazide was suspected to be the culprit and she was advised to stop hydrochlorothiazide  but she continues taking that. On presentation, her sodium is 119.  Nephrology also consulted.  SIADH panel was sent and it showed normal uric acid, cortisol, TSH.  Urine sodium is 79. She has been started on gentle IV fluids for the suspicion of hypovolemic hyponatremia secondary to hydrochlorothiazide.  Also started on salt tablets. Patient seen and examined at the bedside this morning.  She was comfortable, alert and oriented.  Her brother was at the bedside.  She had bruises on her face and she states she had a fall few days ago.  Other physical examination was largely unremarkable. Since there are no beds available on [facility], we have requested her to be transferred to [facility]. We will continue to monitor her sodium level.  We have advised to her to stop hydrochlorothiazide. Patient seen by Dr. Tonie Griffith  this morning.  I agree with his assessment and plan. Potassium and magnesium have been supplemented.  We have requested physical therapy evaluation.

## 2020-05-19 NOTE — ED Notes (Signed)
Report given to Brittany, RN.

## 2020-05-19 NOTE — ED Notes (Signed)
Verified with [facility] that transport had been scheduled

## 2020-05-19 NOTE — ED Notes (Signed)
Pt requesting trazodone, MD made aware and also made aware of pt elevated BP , awaiting response

## 2020-05-19 NOTE — ED Notes (Signed)
Pt agrees with plan for transfer to [facility], no questions for this RN. Walks to restroom x1 assist, A&Ox4 at discharge to [facility].

## 2020-05-19 NOTE — H&P (Addendum)
History and Physical    [name] OXB:[id number] DOB: [birth year] DOA: 05/18/2020  PCP: Nicholes Rough, PA-C (Confirm with patient/family/NH records and if not entered, this has to be entered at [facility] point of entry) Patient coming from: Home  I have personally briefly reviewed patient's old medical records in [place]  Chief Complaint: Generalized weakness. Called by PCP that sodium low and to come to ER.   HPI: [name] is a 69 y.o. female with medical history significant of hypertension, low sodium, GERD, generalized weakness with fall at home who presents after she was called by her doctor and told that her sodium level was low.  She reports she has been having generalized weakness for the last few days and fell 2 days ago hitting her forehead on the ground.  She states she did not have loss of consciousness.  She does have bruising of her forehead with a small knot and bruising around her eyes.  She reports she did have some nausea a few days ago which she attributed to postnasal drip but has not had any vomiting.  She did sustain a small skin abrasion of her left anterior leg when she fell.  She states she has not had any fever or recent illness.  She reports she has generalized fatigue and weakness and difficulty getting around due to the weakness.  She states she has not had any chest pain or palpitation denies any numbness of extremities.  She states she has been hospitalized twice for low sodium level.  At her last admission she was taken off of hydrochlorothiazide as that was suspected to be the cause of the hyponatremia.  She reports that 4 days ago she placed herself back on her hydrochlorothiazide bisoprolol combination pill.    ED Course: In the emergency room patient was found to have a sodium level of 119.  She had elevated blood pressure.  She has not had any other complaints.  Review of Systems:  General: Reports generalized weakness.  Denies fever, chills, weight  loss, night sweats.  Denies dizziness.  Eyes change in appetite HENT: Denies head trauma, headache, denies change in hearing, tinnitus.  Denies nasal congestion or bleeding.  Denies sore throat, sores in mouth.  Denies difficulty swallowing Eyes: Denies blurry vision, pain in eye, drainage.  Denies discoloration of eyes. Neck: Denies pain.  Denies swelling.  Denies pain with movement. Cardiovascular: Denies chest pain, palpitations.  Denies edema.  Denies orthopnea Respiratory: Denies shortness of breath, cough.  Denies wheezing.  Denies sputum production Gastrointestinal: Denies abdominal pain, swelling.  Denies nausea, vomiting, diarrhea.  Denies melena.  Denies hematemesis. Musculoskeletal: Denies limitation of movement.  Denies deformity or swelling.  Denies pain.  Denies arthralgias or myalgias. Genitourinary: Denies pelvic pain.  Denies urinary frequency or hesitancy.  Denies dysuria.  Skin: Denies rash.  Denies petechiae, purpura.  Has bruising of forehead and around eyes right more than left.  Has small facial skin tear of left anterior leg without bleeding or drainage Neurological: Denies headache.  Denies syncope.  Denies seizure activity.  Denies weakness or paresthesia.  Slurred speech, drooping face.  Denies visual change. Psychiatric: Denies depression, anxiety.  Denies suicidal thoughts or ideation.  Denies hallucinations.  Past Medical History:  Diagnosis Date  . Abdominal pain, epigastric   . Acid reflux    Pt was born with acute acid  . GERD (gastroesophageal reflux disease)   . Heart murmur   . Hiatal hernia   .  Hypertension   . Hyponatremia 02/10/2020  . Schatzki's ring   . Ulcer    8 years ago    Past Surgical History:  Procedure Laterality Date  . ABDOMINAL HYSTERECTOMY     complete for fibroids  . KNEE ARTHROSCOPY     right knee  . shoulder repalcement     right  . TONSILLECTOMY AND ADENOIDECTOMY      Social History  reports that she has been smoking  cigarettes. She has been smoking about 0.50 packs per day. She has never used smokeless tobacco. She reports that she does not drink alcohol and does not use drugs.  Allergies  Allergen Reactions  . Iodine Shortness Of Breath and Rash  . Shellfish Allergy     Causes rash    Family History  Problem Relation Age of Onset  . Prostate cancer Father   . Anxiety disorder Mother   . Colon cancer Maternal Grandmother     Prior to Admission medications   Medication Sig Start Date End Date Taking? Authorizing Provider  baclofen (LIORESAL) 10 MG tablet Take 10 mg by mouth daily.   Yes [provider]  bisoprolol-hydrochlorothiazide (ZIAC) 10-6.25 MG tablet Take 1 tablet by mouth daily.   Yes [provider]  lisinopril (ZESTRIL) 40 MG tablet Take 40 mg by mouth daily. 04/29/20  Yes [provider]  sodium chloride 1 g tablet Take 1 tablet (1 g total) by mouth 2 (two) times daily with a meal. 02/11/20  Yes Gonfa, Taye T, MD  amLODipine (NORVASC) 10 MG tablet Take 1 tablet (10 mg total) by mouth daily. Patient not taking: Reported on 05/19/2020 02/12/20   Mercy Riding, MD    Physical Exam: Vitals:   05/19/20 0012 05/19/20 0211 05/19/20 0216 05/19/20 0245  BP:  (!) 171/93 (!) 181/100 (!) 156/95  Pulse:  69 64 (!) 47  Resp: 17 16 15 16   Temp:      TempSrc:      SpO2:  97% 95% 92%  Weight:      Height:        Constitutional: NAD, calm, comfortable Vitals:   05/19/20 0012 05/19/20 0211 05/19/20 0216 05/19/20 0245  BP:  (!) 171/93 (!) 181/100 (!) 156/95  Pulse:  69 64 (!) 47  Resp: 17 16 15 16   Temp:      TempSrc:      SpO2:  97% 95% 92%  Weight:      Height:       Eyes: PERRL, lids and conjunctivae normal HENT: [place]. Has small knot on center of forehead. Has ecchymosis around eyes right more than left.  External ears normal.  Nares patent without epistasis.  Mucous membranes are moist. Posterior pharynx clear of any exudate or lesions. Neck: normal, supple, no  masses, no thyromegaly.  Trachea midline Respiratory: Equal breath sounds and air movement.  Bilateral diffuse Rales.  No wheezing, no crackles. Normal respiratory effort. No accessory muscle use.  Cardiovascular: Regular rate and rhythm, no murmurs / rubs / gallops. No extremity edema. 2+ pedal pulses. .  Abdomen: Soft.  No tenderness, no guarding.  No rebound.  No masses palpated. No hepatosplenomegaly. Bowel sounds   Musculoskeletal: no clubbing / cyanosis. No joint deformity upper and lower extremities. Good ROM, no contractures. Normal muscle tone.  Skin: Warm, dry.  No rashes, lesions, ulcers. No induration.  Superficial skin tear left anterior leg.  No bleeding or drainage.  No induration or fluctuation.  Neurologic: CN 2-12  grossly intact. Sensation intact, DTR normal. Strength 4/5 in all remedies.  Patella DTR +1 bilaterally.  Psychiatric: Normal judgment and insight. Alert and oriented x 3. Normal mood.   (Anything < 9 systems with 2 bullets each down codes to level 1) (If patient refuses exam can't bill higher level) (Make sure to document decubitus ulcers present on admission -- if possible -- and whether patient has chronic indwelling catheter at time of admission)  Labs on Admission: I have personally reviewed following labs and imaging studies  CBC: Recent Labs  Lab 05/18/20 1902  WBC 9.7  HGB 12.1  HCT 34.1*  MCV 86.1  PLT 902    Basic Metabolic Panel: Recent Labs  Lab 05/18/20 1902 05/18/20 1912  NA 119*  --   K 3.0*  --   CL 76*  --   CO2 31  --   GLUCOSE 116*  --   BUN 20  --   CREATININE 1.16*  --   CALCIUM 8.6*  --   MG  --  1.3*    GFR: Estimated Creatinine Clearance: 31.6 mL/min (A) (by C-G formula based on SCr of 1.16 mg/dL (H)).  Liver Function Tests: Recent Labs  Lab 05/18/20 1902  AST 58*  ALT 50*  ALKPHOS 95  BILITOT 1.0  PROT 5.5*  ALBUMIN 3.4*    Urine analysis:    Component Value Date/Time   COLORURINE STRAW (A) 02/03/2020 1608    APPEARANCEUR CLEAR 02/03/2020 1608   LABSPEC 1.008 02/03/2020 1608   PHURINE 6.0 02/03/2020 1608   GLUCOSEU NEGATIVE 02/03/2020 1608   HGBUR NEGATIVE 02/03/2020 1608   BILIRUBINUR NEGATIVE 02/03/2020 1608   KETONESUR NEGATIVE 02/03/2020 1608   PROTEINUR NEGATIVE 02/03/2020 1608   UROBILINOGEN 0.2 05/25/2011 1337   NITRITE NEGATIVE 02/03/2020 1608   LEUKOCYTESUR NEGATIVE 02/03/2020 1608    Radiological Exams on Admission: CT Head Wo Contrast  Result Date: 05/19/2020 CLINICAL DATA:  Fall with right facial bruising EXAM: CT HEAD WITHOUT CONTRAST CT MAXILLOFACIAL WITHOUT CONTRAST TECHNIQUE: Multidetector CT imaging of the head and maxillofacial structures were performed using the standard protocol without intravenous contrast. Multiplanar CT image reconstructions of the maxillofacial structures were also generated. COMPARISON:  02/03/2020 FINDINGS: CT HEAD FINDINGS Brain: There is no mass, hemorrhage or extra-axial collection. The size and configuration of the ventricles and extra-axial CSF spaces are normal. There is hypoattenuation of the periventricular white matter, most commonly indicating chronic ischemic microangiopathy. Vascular: No hyperdense vessel or unexpected vascular calcification. Skull: The visualized skull base, calvarium and extracranial soft tissues are normal. CT MAXILLOFACIAL FINDINGS Osseous: --Complex facial fracture types: No LeFort, zygomaticomaxillary complex or nasoorbitoethmoidal fracture. --Simple fracture types: None. --Mandible, hard palate and teeth: No acute abnormality. Orbits: The globes and optic nerves are intact. Normal extraocular muscles and intraorbital fat. Sinuses: No acute finding. Soft tissues: Normal visualized extracranial soft tissues. IMPRESSION: 1. Chronic ischemic microangiopathy without acute intracranial abnormality. 2. No facial fracture. Electronically Signed   By: Ulyses Jarred M.D.   On: 05/19/2020 00:52   CT Maxillofacial Wo Contrast  Result  Date: 05/19/2020 CLINICAL DATA:  Fall with right facial bruising EXAM: CT HEAD WITHOUT CONTRAST CT MAXILLOFACIAL WITHOUT CONTRAST TECHNIQUE: Multidetector CT imaging of the head and maxillofacial structures were performed using the standard protocol without intravenous contrast. Multiplanar CT image reconstructions of the maxillofacial structures were also generated. COMPARISON:  02/03/2020 FINDINGS: CT HEAD FINDINGS Brain: There is no mass, hemorrhage or extra-axial collection. The size and configuration of the ventricles and extra-axial  CSF spaces are normal. There is hypoattenuation of the periventricular white matter, most commonly indicating chronic ischemic microangiopathy. Vascular: No hyperdense vessel or unexpected vascular calcification. Skull: The visualized skull base, calvarium and extracranial soft tissues are normal. CT MAXILLOFACIAL FINDINGS Osseous: --Complex facial fracture types: No LeFort, zygomaticomaxillary complex or nasoorbitoethmoidal fracture. --Simple fracture types: None. --Mandible, hard palate and teeth: No acute abnormality. Orbits: The globes and optic nerves are intact. Normal extraocular muscles and intraorbital fat. Sinuses: No acute finding. Soft tissues: Normal visualized extracranial soft tissues. IMPRESSION: 1. Chronic ischemic microangiopathy without acute intracranial abnormality. 2. No facial fracture. Electronically Signed   By: Ulyses Jarred M.D.   On: 05/19/2020 00:52    EKG: Independently reviewed.  Normal sinus rhythm.  Negative ST changes.  No acute ST elevation or depression.  Occasional premature SVT  Assessment/Plan Principal Problem:   Hyponatremia Active Problems:   Hypertension   Tobacco use   Generalized weakness   Hypokalemia  1. Hyponatremia Patient admitted to medical floor.  Gentle IV fluid hydration with normal saline provided. Monitor electrolytes and renal function with labs in morning.  2. Hypokalemia Patient with mildly decreased  potassium level 3.0.  Will replete potassium with 40 mcg IV now.  Will check magnesium level and if it is low we will replete prior to potassium being replaced. Labs will be rechecked in the morning  3. Hypertension Patient continued on home medication of metoprolol and Norvasc.  Hydrochlorothiazide will be held with hyponatremia. Monitor blood pressure  4.  Generalized weakness Consult physical therapy for evaluation in the morning.  Fall precautions placed with admission orders.  Will get up with assistance only to prevent fall and further injury.  5. tobacco use Patient is encouraged to discontinue smoking.  Provide nicotine patch to prevent nicotine withdrawal while in the hospital  DVT prophylaxis: Elevated Padua score secondary to decreased mobility and recent trauma of fall.  Lovenox for DVT prophylaxis (Lovenox/Heparin/SCD's/anticoagulated/None (if comfort care) Code Status:   Full code (Full/Partial (specify details) Family Communication:  Diagnosis plan discussed with patient.  Patient verbalized understanding and agrees with plan.  Further recommendation to follow as clinically indicated (Specify name, relationship. Do not write "discussed with patient". Specify tel # if discussed over the phone) Disposition Plan:   Patient is from:  Home  Anticipated DC to:  Home  Anticipated DC date:  July 9th  Anticipated DC barriers: May need home health services and will await PT evaluation.  Consults called:  None (with names) Admission status:  Admission to medical floor (inpatient / obs / tele / medical floor / SDU)  Severity of Illness: The appropriate patient status for this patient is INPATIENT. Inpatient status is judged to be reasonable and necessary in order to provide the required intensity of service to ensure the patient's safety. The patient's presenting symptoms, physical exam findings, and initial radiographic and laboratory data in the context of their chronic comorbidities is  felt to place them at high risk for further clinical deterioration. Furthermore, it is not anticipated that the patient will be medically stable for discharge from the hospital within 2 midnights of admission. The following factors support the patient status of inpatient.   " The patient's presenting symptoms include generalized weakness and fall at home. " The worrisome physical exam findings include facial bruising secondary to fall. " The initial radiographic and laboratory data are worrisome because of low sodium and low potassium " The chronic co-morbidities include hypertension   * I certify  that at the point of admission it is my clinical judgment that the patient will require inpatient hospital care spanning beyond 2 midnights from the point of admission due to high intensity of service, high risk for further deterioration and high frequency of surveillance required.Yevonne Aline Caroline Longie MD [facility]  How to contact the [facility] Attending or Consulting provider [place] or covering provider during after hours [place], for this patient?   1. Check the care team in [facility] and look for a) attending/consulting [facility] provider listed and b) the [facility] team listed 2. Log into [email or web address] and use [place]'s universal password to access. If you do not have the password, please contact the hospital operator. 3. Locate the [facility] provider you are looking for under [facility] and page to a number that you can be directly reached. 4. If you still have difficulty reaching the provider, please page the [facility] (Director on Call) for the Hospitalists listed on amion for assistance.  05/19/2020, 2:54 AM    Addendum: Magnesium level is low at 1.3.  Magnesium 2 g IV ordered and should be given prior to potassium replacement

## 2020-05-19 NOTE — ED Notes (Signed)
Lab called , they will pull magnesium lab work

## 2020-05-19 NOTE — ED Notes (Signed)
RN paged MD Chotiner who called RN back, MD states if the magnesium was already replaced not to give the the second order, and MD placed a verbal order with RN for trazodone 50 mg once dose per pt request.

## 2020-05-20 ENCOUNTER — Other Ambulatory Visit: Payer: Self-pay

## 2020-05-20 LAB — CBC WITH DIFFERENTIAL/PLATELET
Abs Immature Granulocytes: 0.03 10*3/uL (ref 0.00–0.07)
Basophils Absolute: 0.1 10*3/uL (ref 0.0–0.1)
Basophils Relative: 1 %
Eosinophils Absolute: 0.1 10*3/uL (ref 0.0–0.5)
Eosinophils Relative: 1 %
HCT: 30.9 % — ABNORMAL LOW (ref 36.0–46.0)
Hemoglobin: 10.5 g/dL — ABNORMAL LOW (ref 12.0–15.0)
Immature Granulocytes: 0 %
Lymphocytes Relative: 32 %
Lymphs Abs: 2.8 10*3/uL (ref 0.7–4.0)
MCH: 30.8 pg (ref 26.0–34.0)
MCHC: 34 g/dL (ref 30.0–36.0)
MCV: 90.6 fL (ref 80.0–100.0)
Monocytes Absolute: 0.7 10*3/uL (ref 0.1–1.0)
Monocytes Relative: 7 %
Neutro Abs: 5.2 10*3/uL (ref 1.7–7.7)
Neutrophils Relative %: 59 %
Platelets: 270 10*3/uL (ref 150–400)
RBC: 3.41 MIL/uL — ABNORMAL LOW (ref 3.87–5.11)
RDW: 13.7 % (ref 11.5–15.5)
WBC: 8.9 10*3/uL (ref 4.0–10.5)
nRBC: 0 % (ref 0.0–0.2)

## 2020-05-20 LAB — RENAL FUNCTION PANEL
Albumin: 2.6 g/dL — ABNORMAL LOW (ref 3.5–5.0)
Anion gap: 7 (ref 5–15)
BUN: 9 mg/dL (ref 8–23)
CO2: 28 mmol/L (ref 22–32)
Calcium: 7.7 mg/dL — ABNORMAL LOW (ref 8.9–10.3)
Chloride: 96 mmol/L — ABNORMAL LOW (ref 98–111)
Creatinine, Ser: 0.78 mg/dL (ref 0.44–1.00)
GFR calc Af Amer: >60 mL/min (ref >60)
GFR calc non Af Amer: >60 mL/min (ref >60)
Glucose, Bld: 85 mg/dL (ref 70–99)
Phosphorus: 1.9 mg/dL — ABNORMAL LOW (ref 2.5–4.6)
Potassium: 3.9 mmol/L (ref 3.5–5.1)
Sodium: 131 mmol/L — ABNORMAL LOW (ref 135–145)

## 2020-05-20 LAB — MAGNESIUM: Magnesium: 1.7 mg/dL (ref 1.7–2.4)

## 2020-05-20 MED ORDER — AMLODIPINE BESYLATE 10 MG PO TABS: 10.0000 mg | ORAL_TABLET | Freq: Every day | ORAL | 0 refills | Status: DC

## 2020-05-20 MED ORDER — BISOPROLOL FUMARATE 5 MG PO TABS: 5.0000 mg | ORAL_TABLET | Freq: Every day | ORAL | 0 refills | Status: DC

## 2020-05-20 MED ORDER — K PHOS MONO-SOD PHOS DI & MONO 155-852-130 MG PO TABS: 500.0000 mg | ORAL_TABLET | Freq: Two times a day (BID) | ORAL | 0 refills | Status: AC

## 2020-05-20 MED ORDER — SODIUM CHLORIDE 1 G PO TABS
2.0000 g | ORAL_TABLET | Freq: Two times a day (BID) | ORAL | Status: AC
  Administered 2020-05-20: 2 g via ORAL
  Filled 2020-05-20 (×2): qty 2

## 2020-05-20 MED ORDER — LISINOPRIL 20 MG PO TABS
40.0000 mg | ORAL_TABLET | Freq: Every day | ORAL | Status: DC
  Administered 2020-05-20: 40 mg via ORAL
  Filled 2020-05-20: qty 2

## 2020-05-20 MED ORDER — K PHOS MONO-SOD PHOS DI & MONO 155-852-130 MG PO TABS
500.0000 mg | ORAL_TABLET | Freq: Three times a day (TID) | ORAL | Status: DC
  Administered 2020-05-20 (×2): 500 mg via ORAL
  Filled 2020-05-20 (×3): qty 2

## 2020-05-20 NOTE — Discharge Summary (Signed)
Physician Discharge Summary  [name] QQP:[id number] DOB: 03/18/1951 DOA: 05/18/2020  PCP: Nicholes Rough, PA-C  Admit date: 05/18/2020 Discharge date: 05/20/2020  Admitted From: home Disposition:  Home  Recommendations for Outpatient Follow-up:  1. Follow up with PCP in 1-2 weeks 2. Please obtain BMP/CBC in one week 3. Please follow up on the following pending results:  Home Health:Yes  Equipment/Devices: None  Discharge Condition: Stable Code Status:FULL Diet recommendation:  Diet Order            Diet general           Diet Heart Room service appropriate? Yes; Fluid consistency: Thin  Diet effective now                 Brief/Interim Summary: As per admitting: 69 y.o. female with medical history significant of hypertension, low sodium, GERD, generalized weakness with fall at home who presents after she was called by her doctor and told that her sodium level was low.  She reports she has been having generalized weakness for the last few days and fell 2 days ago hitting her forehead on the ground.  She states she did not have loss of consciousness.  She does have bruising of her forehead with a small knot and bruising around her eyes.  She reports she did have some nausea a few days ago which she attributed to postnasal drip but has not had any vomiting.  She did sustain a small skin abrasion of her left anterior leg when she fell.  She states she has not had any fever or recent illness.  She reports she has generalized fatigue and weakness and difficulty getting around due to the weakness.  She states she has not had any chest pain or palpitation denies any numbness of extremities.  She states she has been hospitalized twice for low sodium level.  At her last admission she was taken off of hydrochlorothiazide as that was suspected to be the cause of the hyponatremia.  She reports that 4 days ago she placed herself back on her hydrochlorothiazide bisoprolol combination pill.    ED  Course: In the emergency room patient was found to have a sodium level of 119.  She had elevated blood pressure.  She has not had any other complaints  Patient was admitted.  She was seen by nephrology treated for Hyponatremia: Due to hypovolemia and also due to HCTZ, possible ADH activation by nausea as well.  Underwent IV fluid hydration HCTZ was discontinued and also had salt tablets.  At this time sodium has improved to 131, patient is asymptomatic, feels well and ready for discharge home today.  Reinforced that she is not to take HCTZ again at this is happened many times she will be provided a new prescription for her bisoprolol.  Discussed nephrology.  She will need PCP follow-up to check BMP in 5 days Hypokalemia/hypomagnesemia/phosphatemia: Was repleted we will keep on K-Phos on discharge for few days.  Other issues hypertension/generalized weakness/weight loss: Follow-up outpatient given her weight loss to address nutritional status/occult malignancy.  Discharge Diagnoses:  Principal Problem:   Hyponatremia Active Problems:   Hypertension   Tobacco use   Generalized weakness   Hypokalemia Hyponatremia: Due to hypovolemia and also due to HCTZ, possible ADH activation by nausea as well.  Underwent IV fluid hydration HCTZ was discontinued and also had salt tablets.  At this time sodium has improved to 131, patient is asymptomatic, feels well and ready for discharge home today.  Reinforced that she is not to take HCTZ again at this is happened many times she will be provided a new prescription for her bisoprolol.  Discussed nephrology.  She will need PCP follow-up to check BMP in 5 days Hypokalemia/hypomagnesemia/phosphatemia: Was repleted we will keep on K-Phos on discharge for few days.  Other issues hypertension-bp stable on midodrine, by stable on lisinopril.  HCTZ discontinued altogether generalized weakness/weight loss: Follow-up outpatient given her weight loss to address nutritional  status/occult malignancy.  Patient was admitted as inpatient given severe symptomatic hyponatremia however significantly improved with treatment sooner than expected and she is being discharged.  Consults:  Nephrology  Subjective: Resting well.  Asymptomatic no nausea vomiting chest pain.  Ate her meal very well.  Has been ambulating in the room.  Wants to go home today. Discharge Exam: Vitals:   05/20/20 0345 05/20/20 1344  BP: (!) 141/78 114/65  Pulse: 63 72  Resp: 18 15  Temp: 98.7 F (37.1 C) 98.2 F (36.8 C)  SpO2: 95% 96%   General: Pt is alert, awake, not in acute distress Cardiovascular: RRR, S1/S2 +, no rubs, no gallops Respiratory: CTA bilaterally, no wheezing, no rhonchi Abdominal: Soft, NT, ND, bowel sounds + Extremities: no edema, no cyanosis  Discharge Instructions  Discharge Instructions    Diet general   Complete by: As directed    Discharge instructions   Complete by: As directed    Given your weight loss please follow-up with your primary care doctor to do age-appropriate cancer screening and/nutritional status assessment  Check sodium level in 1 week.  Please call call MD or return to ER for similar or worsening recurring problem that brought you to hospital or if any fever,nausea/vomiting,abdominal pain, uncontrolled pain, chest pain,  shortness of breath or any other alarming symptoms.  Please follow-up your doctor as instructed in a week time and call the office for appointment.  Please avoid alcohol, smoking, or any other illicit substance and maintain healthy habits including taking your regular medications as prescribed.  You were cared for by a hospitalist during your hospital stay. If you have any questions about your discharge medications or the care you received while you were in the hospital after you are discharged, you can call the unit and ask to speak with the hospitalist on call if the hospitalist that took care of you is not  available.  Once you are discharged, your primary care physician will handle any further medical issues. Please note that NO REFILLS for any discharge medications will be authorized once you are discharged, as it is imperative that you return to your primary care physician (or establish a relationship with a primary care physician if you do not have one) for your aftercare needs so that they can reassess your need for medications and monitor your lab values   Increase activity slowly   Complete by: As directed      Allergies as of 05/20/2020      Reactions   Iodine Shortness Of Breath, Rash   Thiazide-type Diuretics Other (See Comments)   Severe hyponatremia July 2021   Shellfish Allergy    Causes rash      Medication List    STOP taking these medications   bisoprolol-hydrochlorothiazide 10-6.25 MG tablet Commonly known as: ZIAC   sodium chloride 1 g tablet     TAKE these medications   amLODipine 10 MG tablet Commonly known as: NORVASC Take 1 tablet (10 mg total) by mouth daily.   baclofen  10 MG tablet Commonly known as: LIORESAL Take 10 mg by mouth daily.   bisoprolol 5 MG tablet Commonly known as: ZEBETA Take 1 tablet (5 mg total) by mouth daily. Start taking on: May 21, 2020   lisinopril 40 MG tablet Commonly known as: ZESTRIL Take 40 mg by mouth daily.   phosphorus 155-852-130 MG tablet Commonly known as: K PHOS NEUTRAL Take 2 tablets (500 mg total) by mouth 2 (two) times daily for 5 days.       Follow-up Information    Nicholes Rough, PA-C Follow up.   Specialty: Physician Assistant Why: in 5 days to check BMP Contact information: [address] [place] [address] [phone]              Allergies  Allergen Reactions   Iodine Shortness Of Breath and Rash   Thiazide-Type Diuretics Other (See Comments)    Severe hyponatremia July 2021   Shellfish Allergy     Causes rash    The results of significant diagnostics from this  hospitalization (including imaging, microbiology, ancillary and laboratory) are listed below for reference.    Microbiology: Recent Results (from the past 240 hour(s))  SARS Coronavirus 2 by RT PCR (hospital order, performed in [facility] hospital lab) Nasopharyngeal Nasopharyngeal Swab     Status: None   Collection Time: 05/18/20 11:26 PM   Specimen: Nasopharyngeal Swab  Result Value Ref Range Status   SARS Coronavirus 2 NEGATIVE NEGATIVE Final    Comment: (NOTE) SARS-CoV-2 target nucleic acids are NOT DETECTED.  The SARS-CoV-2 RNA is generally detectable in upper and lower respiratory specimens during the acute phase of infection. The lowest concentration of SARS-CoV-2 viral copies this assay can detect is 250 copies / mL. A negative result does not preclude SARS-CoV-2 infection and should not be used as the sole basis for treatment or other patient management decisions.  A negative result may occur with improper specimen collection / handling, submission of specimen other than nasopharyngeal swab, presence of viral mutation(s) within the areas targeted by this assay, and inadequate number of viral copies (<250 copies / mL). A negative result must be combined with clinical observations, patient history, and epidemiological information.  Fact Sheet for Patients:   StrictlyIdeas.no  Fact Sheet for Healthcare Providers: BankingDealers.co.za  This test is not yet approved or  cleared by the [place] FDA and has been authorized for detection and/or diagnosis of SARS-CoV-2 by FDA under an Emergency Use Authorization (EUA).  This EUA will remain in effect (meaning this test can be used) for the duration of the COVID-19 declaration under Section 564(b)(1) of the Act, 21 U.S.C. section 360bbb-3(b)(1), unless the authorization is terminated or revoked sooner.  Performed at [facility], [place] [address], [place],  [place] [address]     Procedures/Studies: CT Head Wo Contrast  Result Date: 05/19/2020 CLINICAL DATA:  Fall with right facial bruising EXAM: CT HEAD WITHOUT CONTRAST CT MAXILLOFACIAL WITHOUT CONTRAST TECHNIQUE: Multidetector CT imaging of the head and maxillofacial structures were performed using the standard protocol without intravenous contrast. Multiplanar CT image reconstructions of the maxillofacial structures were also generated. COMPARISON:  02/03/2020 FINDINGS: CT HEAD FINDINGS Brain: There is no mass, hemorrhage or extra-axial collection. The size and configuration of the ventricles and extra-axial CSF spaces are normal. There is hypoattenuation of the periventricular white matter, most commonly indicating chronic ischemic microangiopathy. Vascular: No hyperdense vessel or unexpected vascular calcification. Skull: The visualized skull base, calvarium and extracranial soft tissues are  normal. CT MAXILLOFACIAL FINDINGS Osseous: --Complex facial fracture types: No LeFort, zygomaticomaxillary complex or nasoorbitoethmoidal fracture. --Simple fracture types: None. --Mandible, hard palate and teeth: No acute abnormality. Orbits: The globes and optic nerves are intact. Normal extraocular muscles and intraorbital fat. Sinuses: No acute finding. Soft tissues: Normal visualized extracranial soft tissues. IMPRESSION: 1. Chronic ischemic microangiopathy without acute intracranial abnormality. 2. No facial fracture. Electronically Signed   By: Ulyses Jarred M.D.   On: 05/19/2020 00:52   CT Maxillofacial Wo Contrast  Result Date: 05/19/2020 CLINICAL DATA:  Fall with right facial bruising EXAM: CT HEAD WITHOUT CONTRAST CT MAXILLOFACIAL WITHOUT CONTRAST TECHNIQUE: Multidetector CT imaging of the head and maxillofacial structures were performed using the standard protocol without intravenous contrast. Multiplanar CT image reconstructions of the maxillofacial structures were also generated. COMPARISON:  02/03/2020 FINDINGS:  CT HEAD FINDINGS Brain: There is no mass, hemorrhage or extra-axial collection. The size and configuration of the ventricles and extra-axial CSF spaces are normal. There is hypoattenuation of the periventricular white matter, most commonly indicating chronic ischemic microangiopathy. Vascular: No hyperdense vessel or unexpected vascular calcification. Skull: The visualized skull base, calvarium and extracranial soft tissues are normal. CT MAXILLOFACIAL FINDINGS Osseous: --Complex facial fracture types: No LeFort, zygomaticomaxillary complex or nasoorbitoethmoidal fracture. --Simple fracture types: None. --Mandible, hard palate and teeth: No acute abnormality. Orbits: The globes and optic nerves are intact. Normal extraocular muscles and intraorbital fat. Sinuses: No acute finding. Soft tissues: Normal visualized extracranial soft tissues. IMPRESSION: 1. Chronic ischemic microangiopathy without acute intracranial abnormality. 2. No facial fracture. Electronically Signed   By: Ulyses Jarred M.D.   On: 05/19/2020 00:52    Labs: BNP (last 3 results) Recent Labs    02/10/20 0756  BNP 696.2*   Basic Metabolic Panel: Recent Labs  Lab 05/18/20 1902 05/18/20 1912 05/19/20 0415 05/20/20 0329  NA 119*  --  123* 131*  K 3.0*  --  4.1 3.9  CL 76*  --  82* 96*  CO2 31  --  31 28  GLUCOSE 116*  --  81 85  BUN 20  --  12 9  CREATININE 1.16*  --  1.05* 0.78  CALCIUM 8.6*  --  8.6* 7.7*  MG  --  1.3*  --  1.7  PHOS  --   --   --  1.9*   Liver Function Tests: Recent Labs  Lab 05/18/20 1902 05/20/20 0329  AST 58*  --   ALT 50*  --   ALKPHOS 95  --   BILITOT 1.0  --   PROT 5.5*  --   ALBUMIN 3.4* 2.6*   No results for input(s): LIPASE, AMYLASE in the last 168 hours. No results for input(s): AMMONIA in the last 168 hours. CBC: Recent Labs  Lab 05/18/20 1902 05/19/20 0415 05/20/20 0329  WBC 9.7 13.0* 8.9  NEUTROABS  --   --  5.2  HGB 12.1 11.9* 10.5*  HCT 34.1* 33.2* 30.9*  MCV 86.1 86.7  90.6  PLT 335 329 270   Cardiac Enzymes: No results for input(s): CKTOTAL, CKMB, CKMBINDEX, TROPONINI in the last 168 hours. BNP: Invalid input(s): POCBNP CBG: No results for input(s): GLUCAP in the last 168 hours. D-Dimer No results for input(s): DDIMER in the last 72 hours. Hgb A1c No results for input(s): HGBA1C in the last 72 hours. Lipid Profile No results for input(s): CHOL, HDL, LDLCALC, TRIG, CHOLHDL, LDLDIRECT in the last 72 hours. Thyroid function studies Recent Labs    05/19/20 0941  TSH 0.968   Anemia work up No results for input(s): VITAMINB12, FOLATE, FERRITIN, TIBC, IRON, RETICCTPCT in the last 72 hours. Urinalysis    Component Value Date/Time   COLORURINE STRAW (A) 02/03/2020 1608   APPEARANCEUR CLEAR 02/03/2020 1608   LABSPEC 1.008 02/03/2020 1608   PHURINE 6.0 02/03/2020 1608   GLUCOSEU NEGATIVE 02/03/2020 1608   HGBUR NEGATIVE 02/03/2020 1608   BILIRUBINUR NEGATIVE 02/03/2020 1608   KETONESUR NEGATIVE 02/03/2020 1608   PROTEINUR NEGATIVE 02/03/2020 1608   UROBILINOGEN 0.2 05/25/2011 1337   NITRITE NEGATIVE 02/03/2020 1608   LEUKOCYTESUR NEGATIVE 02/03/2020 1608   Sepsis Labs Invalid input(s): PROCALCITONIN,  WBC,  LACTICIDVEN Microbiology Recent Results (from the past 240 hour(s))  SARS Coronavirus 2 by RT PCR (hospital order, performed in [place] hospital lab) Nasopharyngeal Nasopharyngeal Swab     Status: None   Collection Time: 05/18/20 11:26 PM   Specimen: Nasopharyngeal Swab  Result Value Ref Range Status   SARS Coronavirus 2 NEGATIVE NEGATIVE Final    Comment: (NOTE) SARS-CoV-2 target nucleic acids are NOT DETECTED.  The SARS-CoV-2 RNA is generally detectable in upper and lower respiratory specimens during the acute phase of infection. The lowest concentration of SARS-CoV-2 viral copies this assay can detect is 250 copies / mL. A negative result does not preclude SARS-CoV-2 infection and should not be used as the sole basis for  treatment or other patient management decisions.  A negative result may occur with improper specimen collection / handling, submission of specimen other than nasopharyngeal swab, presence of viral mutation(s) within the areas targeted by this assay, and inadequate number of viral copies (<250 copies / mL). A negative result must be combined with clinical observations, patient history, and epidemiological information.  Fact Sheet for Patients:   StrictlyIdeas.no  Fact Sheet for Healthcare Providers: BankingDealers.co.za  This test is not yet approved or  cleared by the [place] FDA and has been authorized for detection and/or diagnosis of SARS-CoV-2 by FDA under an Emergency Use Authorization (EUA).  This EUA will remain in effect (meaning this test can be used) for the duration of the COVID-19 declaration under Section 564(b)(1) of the Act, 21 U.S.C. section 360bbb-3(b)(1), unless the authorization is terminated or revoked sooner.  Performed at [facility], [place] [address], [place], [place] [address]      Time coordinating discharge: 25 minutes  SIGNED: Antonieta Pert, MD  [facility] 05/20/2020, 3:00 PM  If 7PM-7AM, please contact night-coverage [email or web address]

## 2020-05-20 NOTE — Progress Notes (Signed)
[facility] Progress Note  Subjective: Na 123 last night and 131 this am, pt feeling better.   Vitals:   05/19/20 1635 05/19/20 2033 05/20/20 0029 05/20/20 0345  BP: 114/66 126/73 115/68 (!) 141/78  Pulse: (!) 58 (!) 59 (!) 55 63  Resp: 18 16 14 18   Temp: 98.1 F (36.7 C) 99 F (37.2 C) 97.6 F (36.4 C) 98.7 F (37.1 C)  TempSrc:  Oral Oral Oral  SpO2: 98% 98% 98% 95%  Weight:      Height:        Exam:   alert, nad   no jvd  Chest cta bilat  Cor reg no RG  Abd soft ntnd no ascites   Ext no LE edema   Alert, NF, ox3   serum osmolality 257 urine osmolality 568 urine sodium 79 random cortisol 20.4  TSH 0.97 B/l creat = 0.7-0.8   Home meds:   norvasc 10/ bisoprolol-HCTZ 10- 6.25 qd/ lisinopril 40 qd  - baclofen/ NaCl tabs   Assessment/ Plan: 1.  Hyponatremia: due to hypovolemic hyponatremia w/ ^B/Cr and should improved with IV volume expansion stopping HCTZ. Possible ADH activation by nausea as well.  Sodium level has improved to 123 last night and 131 this am. Avoid thiazide diuretics in the future. Will resume acei now that Na+ is corrected. Will dc salt tabs after evening dose and will dc IVF"s at 6 pm. Should have post dc labs done by PCP within 5-10 days. Call w/ any questions.  Will sign off.  2.  Hypokalemia: Secondary to diuretic induced losses as well as concomitant hypomagnesemia, replace via oral route. 3.  Hypomagnesemia: Secondary to thiazide induced losses, replace via oral route. 4.  Hypertension: continue norvasc and home bisoprolol, will resume lisinopril now that Na+ corrected. Avoid thiazide from here forward. Vol is euvolemic today.  5.  Generalized weakness: improving 6. Weight loss: Her recent weight loss raises some concern regarding her nutritional status/occult malignancy and this should be evaluated further as an outpatient.        [name] 05/20/2020, 11:40 AM   Recent Labs  Lab 05/19/20 0415 05/20/20 0329  K 4.1  3.9  BUN 12 9  CREATININE 1.05* 0.78  CALCIUM 8.6* 7.7*  PHOS  --  1.9*  HGB 11.9* 10.5*   Inpatient medications: . amLODipine  10 mg Oral Daily  . aspirin EC  81 mg Oral Daily  . bisoprolol  5 mg Oral Daily  . enoxaparin (LOVENOX) injection  30 mg Subcutaneous Q24H  . nicotine  14 mg Transdermal Daily  . phosphorus  500 mg Oral TID  . sodium chloride  2 g Oral BID WC   . sodium chloride 75 mL/hr at 05/20/20 0400  . magnesium sulfate bolus IVPB     acetaminophen **OR** acetaminophen, ipratropium-albuterol, promethazine

## 2020-05-20 NOTE — Progress Notes (Signed)
Late entry for 05/19/20:  Pt was evaluated and has been discharged today to [facility] for further care.  Follow up with [facility] therapy staff.   05/20/20 1300  PT Visit Information  Last PT Received On 05/19/20  Assistance Needed +1  History of Present Illness 69 yo female with onset of fall and weakness at home was brought to ED, has been home independently and able to manage stairs and self care, but has help to manage household.  Noted hyponatremia and hypokalemia, replenishing.  PMHx:  HTN, PUD, Schatzki's ring, smoker,   Subjective Data  Patient Stated Goal to go home again to stay with brother  Precautions  Precautions Fall  Precaution Comments monitor vitals, pulse, sats  Restrictions  Weight Bearing Restrictions No  Pain Assessment  Pain Assessment No/denies pain  Cognition  Arousal/Alertness Awake/alert  Behavior During Therapy WFL for tasks assessed/performed  Overall Cognitive Status Within Functional Limits for tasks assessed  Bed Mobility  Overal bed mobility Needs Assistance  Bed Mobility Supine to Sit;Sit to Supine  Supine to sit Min assist  Sit to supine Min guard  General bed mobility comments min assist due to awkwardness of gurney  Transfers  Overall transfer level Modified independent  Equipment used 1 person hand held assist  General transfer comment pt is able to scoot out and back on gurney  Ambulation/Gait  Ambulation/Gait assistance Min guard  Assistive device 1 person hand held assist (min guard)  Gait Pattern/deviations Step-through pattern;Decreased stride length;Wide base of support  General Gait Details pt is altering gait to widen base but is able to walk with supervision  Gait velocity reduced  Gait velocity interpretation <1.31 ft/sec, indicative of household ambulator  Balance  Overall balance assessment History of Falls;Needs assistance  Sitting-balance support Feet supported  Sitting balance-Leahy Scale Good  Standing balance support Single extremity  supported  Standing balance-Leahy Scale Fair  General Comments  General comments (skin integrity, edema, etc.) pt is maintaining O2 sats with mobility, no drops of sats or unusual changes in pulse  Exercises  Exercises Other exercises (LE strength is 4+ to 5 BLE's)  PT - End of Session  Equipment Utilized During Treatment Gait belt  Activity Tolerance Patient tolerated treatment well;Patient limited by fatigue  Patient left in bed;with call bell/phone within reach  Nurse Communication Mobility status   PT - Assessment/Plan  PT Visit Diagnosis Unsteadiness on feet (R26.81);History of falling (Z91.81)  PT Frequency (ACUTE ONLY) Min 3X/week  Follow Up Recommendations Home health PT;Supervision for mobility/OOB  PT equipment None recommended by PT  AM-PAC PT "6 Clicks" Mobility Outcome Measure (Version 2)  Help needed turning from your back to your side while in a flat bed without using bedrails? 4  Help needed moving from lying on your back to sitting on the side of a flat bed without using bedrails? 3  Help needed moving to and from a bed to a chair (including a wheelchair)? 3  Help needed standing up from a chair using your arms (e.g., wheelchair or bedside chair)? 3  Help needed to walk in hospital room? 3  Help needed climbing 3-5 steps with a railing?  2  6 Click Score 18  Consider Recommendation of Discharge To: Home with [facility]  Acute Rehab PT Goals  PT Goal Formulation With patient  Time For Goal Achievement 05/26/20  Potential to Achieve Goals Good   Mee Hives, PT MS Acute Rehab Dept. Number: [place] [facility] [place]

## 2020-05-20 NOTE — Progress Notes (Signed)
Physical Therapy Treatment Patient Details Name: [name] MRN: [id number] DOB: Jun 05, 1951 Today's Date: 05/20/2020    History of Present Illness Pt is 69 yo female with onset of fall and weakness at home was brought to ED, has been home independently and able to manage stairs and self care, but has help to manage household.  Noted hyponatremia and hypokalemia, replenishing.  PMHx:  HTN, PUD, Schatzki's ring, smoker,     PT Comments    Pt making good progress today.  She was able to ambulate 400' with supervision and participate in dynamic balance with gait and standing.  Does demonstrate mild instability compared to baseline.  Cont POC.   Follow Up Recommendations  Home health PT;Supervision - Intermittent     Equipment Recommendations  None recommended by PT    Recommendations for Other Services       Precautions / Restrictions Precautions Precautions: Fall    Mobility  Bed Mobility Overal bed mobility: Needs Assistance Bed Mobility: Supine to Sit;Sit to Supine     Supine to sit: Supervision Sit to supine: Supervision      Transfers Overall transfer level: Needs assistance Equipment used: None Transfers: Sit to/from Stand Sit to Stand: Supervision         General transfer comment: performed x 3  Ambulation/Gait Ambulation/Gait assistance: Supervision Gait Distance (Feet): 400 Feet Assistive device: None Gait Pattern/deviations: Step-through pattern;Trunk flexed     General Gait Details: cued for posture   Stairs             Wheelchair Mobility    Modified Rankin (Stroke Patients Only)       Balance Overall balance assessment: History of Falls;Needs assistance Sitting-balance support: Feet supported Sitting balance-Leahy Scale: Good     Standing balance support: No upper extremity supported Standing balance-Leahy Scale: Good               High level balance activites: Side stepping;Backward walking;Direction changes;Sudden  stops;Turns;Head turns High Level Balance Comments: Reaching outside BOS in all directions            Cognition Arousal/Alertness: Awake/alert Behavior During Therapy: WFL for tasks assessed/performed Overall Cognitive Status: Within Functional Limits for tasks assessed                                        Exercises      General Comments General comments (skin integrity, edema, etc.): VSS      Pertinent Vitals/Pain Pain Assessment: No/denies pain    Home Living                      Prior Function            PT Goals (current goals can now be found in the care plan section) Acute Rehab PT Goals Patient Stated Goal: to go home again to stay with brother PT Goal Formulation: With patient Time For Goal Achievement: 05/26/20 Potential to Achieve Goals: Good Progress towards PT goals: Progressing toward goals    Frequency    Min 3X/week      PT Plan Current plan remains appropriate    Co-evaluation              AM-PAC PT "6 Clicks" Mobility   Outcome Measure  Help needed turning from your back to your side while in a flat bed without using bedrails?: None Help needed moving  from lying on your back to sitting on the side of a flat bed without using bedrails?: None Help needed moving to and from a bed to a chair (including a wheelchair)?: None Help needed standing up from a chair using your arms (e.g., wheelchair or bedside chair)?: None Help needed to walk in hospital room?: None Help needed climbing 3-5 steps with a railing? : A Little 6 Click Score: 23    End of Session Equipment Utilized During Treatment: Gait belt Activity Tolerance: Patient tolerated treatment well Patient left: in bed;with call bell/phone within reach Nurse Communication: Mobility status PT Visit Diagnosis: Unsteadiness on feet (R26.81);History of falling (Z91.81)     Time: 9983-3825 PT Time Calculation (min) (ACUTE ONLY): 20 min  Charges:  $Gait  Training: 8-22 mins                     Abran Richard, PT Acute Rehab Services Pager [phone] [name] [place] 05/20/2020, 4:34 PM

## 2020-05-20 NOTE — TOC Transition Note (Addendum)
Transition of Care [facility] [place]) - CM/SW Discharge Note   Patient Details  Name: Airanna Partin MRN: [id number] Date of Birth: 02/16/1951  [facility] CM/SW Contact:  Trish Mage, LCSW Phone Number: 05/20/2020, 4:26 PM   Clinical Narrative:  Patient seen in follow up to PT recommendation of [facility] [place] [facility].  Ms Rau is open to this service.  She lives alone with her 4 legged friends [cats], but will be staying with her brother at [address] in [place] at d/c as he works from home and she knows it would be helpful to have someone close by as she comes out of the hospital. She declines all offers of DME, but then adds that she knows that her brother has equipment at home that their late father was using.  She expressed no preference of providers.  Alvis Lemmings does not have a contract with [employer].  Will continue to look for provider.     Addendum:  Santiago Glad with [facility] confirms ability to provide PT for patient at d/c. TOC sign off.    Final next level of care: [place] Barriers to Discharge: No Barriers Identified   Patient Goals and CMS Choice        Discharge Placement                       Discharge Plan and Services                                     Social Determinants of Health (SDOH) Interventions     Readmission Risk Interventions No flowsheet data found.

## 2020-07-07 ENCOUNTER — Emergency Department (HOSPITAL_COMMUNITY): Payer: Medicare Other

## 2020-07-07 ENCOUNTER — Emergency Department (HOSPITAL_COMMUNITY)
Admission: EM | Admit: 2020-07-07 | Discharge: 2020-07-07 | Disposition: A | Discharge status: Home / Self Care | Payer: Medicare Other | Attending: Emergency Medicine | Admitting: Emergency Medicine

## 2020-07-07 ENCOUNTER — Other Ambulatory Visit: Payer: Self-pay

## 2020-07-07 ENCOUNTER — Encounter (HOSPITAL_COMMUNITY): Payer: Self-pay | Admitting: Emergency Medicine

## 2020-07-07 DIAGNOSIS — Z96611 Presence of right artificial shoulder joint: Secondary | ICD-10-CM | POA: Diagnosis not present

## 2020-07-07 DIAGNOSIS — E871 Hypo-osmolality and hyponatremia: Secondary | ICD-10-CM | POA: Insufficient documentation

## 2020-07-07 DIAGNOSIS — I10 Essential (primary) hypertension: Secondary | ICD-10-CM | POA: Diagnosis not present

## 2020-07-07 DIAGNOSIS — Z79899 Other long term (current) drug therapy: Secondary | ICD-10-CM | POA: Insufficient documentation

## 2020-07-07 DIAGNOSIS — F17210 Nicotine dependence, cigarettes, uncomplicated: Secondary | ICD-10-CM | POA: Insufficient documentation

## 2020-07-07 DIAGNOSIS — I951 Orthostatic hypotension: Secondary | ICD-10-CM

## 2020-07-07 LAB — URINALYSIS, ROUTINE W REFLEX MICROSCOPIC
Bilirubin Urine: NEGATIVE
Glucose, UA: 50 mg/dL — AB
Hgb urine dipstick: NEGATIVE
Ketones, ur: NEGATIVE mg/dL
Leukocytes,Ua: NEGATIVE
Nitrite: NEGATIVE
Protein, ur: NEGATIVE mg/dL
Specific Gravity, Urine: 1.005 (ref 1.005–1.030)
pH: 6 (ref 5.0–8.0)

## 2020-07-07 LAB — COMPREHENSIVE METABOLIC PANEL
ALT: 51 U/L — ABNORMAL HIGH (ref 0–44)
AST: 75 U/L — ABNORMAL HIGH (ref 15–41)
Albumin: 3.6 g/dL (ref 3.5–5.0)
Alkaline Phosphatase: 73 U/L (ref 38–126)
Anion gap: 9 (ref 5–15)
BUN: 19 mg/dL (ref 8–23)
CO2: 33 mmol/L — ABNORMAL HIGH (ref 22–32)
Calcium: 9.1 mg/dL (ref 8.9–10.3)
Chloride: 87 mmol/L — ABNORMAL LOW (ref 98–111)
Creatinine, Ser: 1.12 mg/dL — ABNORMAL HIGH (ref 0.44–1.00)
GFR calc Af Amer: 59 mL/min — ABNORMAL LOW (ref >60)
GFR calc non Af Amer: 51 mL/min — ABNORMAL LOW (ref >60)
Glucose, Bld: 117 mg/dL — ABNORMAL HIGH (ref 70–99)
Potassium: 3.7 mmol/L (ref 3.5–5.1)
Sodium: 129 mmol/L — ABNORMAL LOW (ref 135–145)
Total Bilirubin: 0.6 mg/dL (ref 0.3–1.2)
Total Protein: 5.9 g/dL — ABNORMAL LOW (ref 6.5–8.1)

## 2020-07-07 LAB — CBC WITH DIFFERENTIAL/PLATELET
Abs Immature Granulocytes: 0.09 10*3/uL — ABNORMAL HIGH (ref 0.00–0.07)
Basophils Absolute: 0.1 10*3/uL (ref 0.0–0.1)
Basophils Relative: 1 %
Eosinophils Absolute: 0 10*3/uL (ref 0.0–0.5)
Eosinophils Relative: 0 %
HCT: 34.5 % — ABNORMAL LOW (ref 36.0–46.0)
Hemoglobin: 11.9 g/dL — ABNORMAL LOW (ref 12.0–15.0)
Immature Granulocytes: 1 %
Lymphocytes Relative: 22 %
Lymphs Abs: 2 10*3/uL (ref 0.7–4.0)
MCH: 31.2 pg (ref 26.0–34.0)
MCHC: 34.5 g/dL (ref 30.0–36.0)
MCV: 90.3 fL (ref 80.0–100.0)
Monocytes Absolute: 0.8 10*3/uL (ref 0.1–1.0)
Monocytes Relative: 9 %
Neutro Abs: 6 10*3/uL (ref 1.7–7.7)
Neutrophils Relative %: 67 %
Platelets: 308 10*3/uL (ref 150–400)
RBC: 3.82 MIL/uL — ABNORMAL LOW (ref 3.87–5.11)
RDW: 14.6 % (ref 11.5–15.5)
WBC: 8.9 10*3/uL (ref 4.0–10.5)
nRBC: 0 % (ref 0.0–0.2)

## 2020-07-07 LAB — SODIUM, URINE, RANDOM: Sodium, Ur: 15 mmol/L

## 2020-07-07 MED ORDER — SODIUM CHLORIDE 0.9 % IV BOLUS
1000.0000 mL | Freq: Once | INTRAVENOUS | Status: AC
  Administered 2020-07-07: 1000 mL via INTRAVENOUS

## 2020-07-07 NOTE — Discharge Instructions (Addendum)
Call the nephrologist also below to set up a follow-up appointment for further evaluation of your recurrent hyponatremia. Follow-up with your primary care doctor as needed for further evaluation of your hyponatremia. Continue to stay well-hydrated with water. Return to the emergency room with any new, worsening, or concerning symptoms.

## 2020-07-07 NOTE — ED Provider Notes (Signed)
[place] DEPT Provider Note   CSN: [id number] Arrival date & time: 07/07/20  1044     History Chief Complaint  Patient presents with  . Abnormal Lab  . Fall    [name] is a 69 y.o. female resenting for evaluation of dizziness/lightheadedness and fall.  Patient states she fell on Monday, and again yesterday. She states she is following which she is overheated, because she feels lightheaded and dizzy. These symptoms are present when she stands up, resolve when she is laying flat. She has a history of similar, usually related to her low sodium levels. She saw her PCP yesterday, was found to have mildly low sodium levels. Does encourage that she come to the ER for fluids and to see if she needs to be admitted or can be treated outpatient. Patient states they do not know why she has had recurrent episodes of hyponatremia this year. She denies recent fevers, chills, chest pain, shortness breath, cough, nausea, vomiting abdominal pain, urinary symptoms, abnormal bowel movements. She denies injury from the fall, denies hitting her head or loss of consciousness. She stopped her HCTZ as instructed, is still on lisinopril and bisoprolol. She has not had her blood pressure medicine in the past 24 hours due to PCP recommendations. Patient states she continues to eat and drink well, but continues to have weight loss.  Additional history taken chart review. Patient with a history of hyponatremia, hypertension, GERD, anxiety. I reviewed multiple recent visits for hyponatremia, including recent admission on 05/18/2020. I reviewed patient's PCP note from yesterday.  HPI     Past Medical History:  Diagnosis Date  . Abdominal pain, epigastric   . Acid reflux    Pt was born with acute acid  . Anxiety   . GERD (gastroesophageal reflux disease)   . Heart murmur   . Hiatal hernia   . Hypertension   . Hyponatremia 02/10/2020  . Schatzki's ring   . Ulcer    8  years ago    Patient Active Problem List   Diagnosis Date Noted  . Generalized weakness 05/19/2020  . Hypokalemia 05/19/2020  . Hyponatremia 02/03/2020  . Ambulatory dysfunction   . Prediabetes 03/23/2013  . Hyperlipidemia 03/23/2013  . Colon cancer screening 11/18/2012  . Hypertension 09/16/2012  . GERD (gastroesophageal reflux disease) 09/16/2012  . Tobacco use 09/16/2012    Past Surgical History:  Procedure Laterality Date  . ABDOMINAL HYSTERECTOMY     complete for fibroids  . KNEE ARTHROSCOPY     right knee  . shoulder repalcement     right  . TONSILLECTOMY AND ADENOIDECTOMY       OB History   No obstetric history on file.     Family History  Problem Relation Age of Onset  . Prostate cancer Father   . Anxiety disorder Mother   . Colon cancer Maternal Grandmother     Social History   Tobacco Use  . Smoking status: Current Every Day Smoker    Packs/day: 0.50    Types: Cigarettes  . Smokeless tobacco: Never Used  . Tobacco comment: almost a pack a day;   Vaping Use  . Vaping Use: Never used  Substance Use Topics  . Alcohol use: No    Alcohol/week: 0.0 standard drinks  . Drug use: No    Home Medications Prior to Admission medications   Medication Sig Start Date End Date Taking? Authorizing Provider  amLODipine (NORVASC) 10 MG tablet Take 1 tablet (10 mg  total) by mouth daily. 05/20/20 06/19/20  Antonieta Pert, MD  baclofen (LIORESAL) 10 MG tablet Take 10 mg by mouth daily.    [provider]  bisoprolol (ZEBETA) 5 MG tablet Take 1 tablet (5 mg total) by mouth daily. 05/21/20 06/20/20  Antonieta Pert, MD  lisinopril (ZESTRIL) 40 MG tablet Take 40 mg by mouth daily. 04/29/20   [provider]    Allergies    Iodine, Thiazide-type diuretics, and Shellfish allergy  Review of Systems   Review of Systems  Constitutional: Positive for unexpected weight change.  Neurological: Positive for dizziness and light-headedness.  All other systems reviewed and  are negative.   Physical Exam Updated Vital Signs BP 114/72   Pulse 75   Temp 98.5 F (36.9 C) (Oral)   Resp 18   SpO2 100%   Physical Exam Vitals and nursing note reviewed.  Constitutional:      General: She is not in acute distress.    Appearance: She is well-developed and underweight.     Comments: Underweight. Nontoxic  HENT:     Head: Normocephalic and atraumatic.     Mouth/Throat:     Mouth: Mucous membranes are moist.     Comments: MM moist Eyes:     Extraocular Movements: Extraocular movements intact.     Conjunctiva/sclera: Conjunctivae normal.     Pupils: Pupils are equal, round, and reactive to light.  Cardiovascular:     Rate and Rhythm: Normal rate and regular rhythm.     Pulses: Normal pulses.  Pulmonary:     Effort: Pulmonary effort is normal. No respiratory distress.     Breath sounds: Normal breath sounds. No wheezing.     Comments: Clear lung sounds in all fields Abdominal:     General: There is no distension.     Palpations: Abdomen is soft. There is no mass.     Tenderness: There is no abdominal tenderness. There is no guarding or rebound.  Musculoskeletal:        General: Normal range of motion.     Cervical back: Normal range of motion and neck supple.  Skin:    General: Skin is warm and dry.     Capillary Refill: Capillary refill takes less than 2 seconds.  Neurological:     Mental Status: She is alert and oriented to person, place, and time.     ED Results / Procedures / Treatments   Labs (all labs ordered are listed, but only abnormal results are displayed) Labs Reviewed  CBC WITH DIFFERENTIAL/PLATELET - Abnormal; Notable for the following components:      Result Value   RBC 3.82 (*)    Hemoglobin 11.9 (*)    HCT 34.5 (*)    Abs Immature Granulocytes 0.09 (*)    All other components within normal limits  COMPREHENSIVE METABOLIC PANEL - Abnormal; Notable for the following components:   Sodium 129 (*)    Chloride 87 (*)    CO2 33  (*)    Glucose, Bld 117 (*)    Creatinine, Ser 1.12 (*)    Total Protein 5.9 (*)    AST 75 (*)    ALT 51 (*)    GFR calc non Af Amer 51 (*)    GFR calc Af Amer 59 (*)    All other components within normal limits  URINALYSIS, ROUTINE W REFLEX MICROSCOPIC  SODIUM, URINE, RANDOM    EKG None  Radiology No results found.  Procedures Procedures (including critical care time)  Medications Ordered in ED Medications  sodium chloride 0.9 % bolus 1,000 mL (0 mLs Intravenous Stopped 07/07/20 1411)    ED Course  I have reviewed the triage vital signs and the nursing notes.  Pertinent labs & imaging results that were available during my care of the patient were reviewed by me and considered in my medical decision making (see chart for details).    MDM Rules/Calculators/A&P                          Patient presenting for evaluation of lightheadedness and dizziness and fall. On exam, patient appears nontoxic. She is hypotensive upon arrival, even when laying flat. As such, will give fluids. Will recheck labs including sodium. Will check EKG. Urine and urine sodium ordered. Patient has not followed up with nephrology, this may be an avenue to pursue for further evaluation of recurrent hyponatremia. Case discussed with attending, Dr. Ron Parker evaluated the pt.   Labs show hyponatremia of 129, while this is low this is better than her normal. Creatinine mildly elevated at 1.12, although once again similar to previous episodes. Electrolytes otherwise stable. No leukocytosis. EKG without signs of ischemia.  After liter of fluid, patient's blood pressure has improved. Orthostatics are negative, patient reports no further lightheadedness or dizziness, even upon standing. As vital signs have normalized and symptoms resolved, I do not believe she needs to be admitted to the hospital at this time. I encourage close follow-up with PCP for recheck of sodium. Encouraged follow-up with nephrology. At this time,  pt appears safe for d/c. Return precautions given. Pt states she understands and agrees to plan.   Final Clinical Impression(s) / ED Diagnoses Final diagnoses:  Hyponatremia  Orthostatic hypotension    Rx / DC Orders ED Discharge Orders    None       Franchot Heidelberg, PA-C 07/07/20 1436    Breck Coons, MD 07/08/20 [phone]

## 2020-07-07 NOTE — ED Triage Notes (Signed)
Per pt, states she has been to the ED for same issue-states PCP informed her her sodium was low-states she has been having frequent falls-

## 2020-07-07 NOTE — ED Notes (Signed)
Pt walked to restroom 

## 2020-07-08 ENCOUNTER — Ambulatory Visit: Payer: Medicare Other | Admitting: Physician Assistant

## 2020-08-28 ENCOUNTER — Other Ambulatory Visit: Payer: Self-pay

## 2020-08-28 ENCOUNTER — Inpatient Hospital Stay (HOSPITAL_COMMUNITY)
Admission: EM | Admit: 2020-08-28 | Discharge: 2020-09-14 | DRG: 870 | Disposition: A | Discharge status: Skilled Nursing Facility | Payer: Medicare Other | Attending: Internal Medicine | Admitting: Internal Medicine

## 2020-08-28 ENCOUNTER — Emergency Department (HOSPITAL_COMMUNITY): Payer: Medicare Other

## 2020-08-28 DIAGNOSIS — I5033 Acute on chronic diastolic (congestive) heart failure: Secondary | ICD-10-CM | POA: Diagnosis present

## 2020-08-28 DIAGNOSIS — J9600 Acute respiratory failure, unspecified whether with hypoxia or hypercapnia: Secondary | ICD-10-CM | POA: Diagnosis present

## 2020-08-28 DIAGNOSIS — R000 Tachycardia, unspecified: Secondary | ICD-10-CM | POA: Diagnosis not present

## 2020-08-28 DIAGNOSIS — L03116 Cellulitis of left lower limb: Secondary | ICD-10-CM | POA: Diagnosis present

## 2020-08-28 DIAGNOSIS — T502X5A Adverse effect of carbonic-anhydrase inhibitors, benzothiadiazides and other diuretics, initial encounter: Secondary | ICD-10-CM | POA: Diagnosis not present

## 2020-08-28 DIAGNOSIS — J440 Chronic obstructive pulmonary disease with acute lower respiratory infection: Secondary | ICD-10-CM | POA: Diagnosis present

## 2020-08-28 DIAGNOSIS — Q394 Esophageal web: Secondary | ICD-10-CM

## 2020-08-28 DIAGNOSIS — J9811 Atelectasis: Secondary | ICD-10-CM | POA: Diagnosis present

## 2020-08-28 DIAGNOSIS — R4182 Altered mental status, unspecified: Secondary | ICD-10-CM | POA: Diagnosis present

## 2020-08-28 DIAGNOSIS — R011 Cardiac murmur, unspecified: Secondary | ICD-10-CM | POA: Diagnosis present

## 2020-08-28 DIAGNOSIS — Z515 Encounter for palliative care: Secondary | ICD-10-CM

## 2020-08-28 DIAGNOSIS — Z9889 Other specified postprocedural states: Secondary | ICD-10-CM

## 2020-08-28 DIAGNOSIS — J189 Pneumonia, unspecified organism: Secondary | ICD-10-CM | POA: Diagnosis not present

## 2020-08-28 DIAGNOSIS — J9690 Respiratory failure, unspecified, unspecified whether with hypoxia or hypercapnia: Secondary | ICD-10-CM

## 2020-08-28 DIAGNOSIS — I5031 Acute diastolic (congestive) heart failure: Secondary | ICD-10-CM | POA: Diagnosis not present

## 2020-08-28 DIAGNOSIS — E440 Moderate protein-calorie malnutrition: Secondary | ICD-10-CM | POA: Diagnosis present

## 2020-08-28 DIAGNOSIS — Z7189 Other specified counseling: Secondary | ICD-10-CM | POA: Diagnosis not present

## 2020-08-28 DIAGNOSIS — Z888 Allergy status to other drugs, medicaments and biological substances status: Secondary | ICD-10-CM

## 2020-08-28 DIAGNOSIS — J690 Pneumonitis due to inhalation of food and vomit: Secondary | ICD-10-CM | POA: Diagnosis present

## 2020-08-28 DIAGNOSIS — E871 Hypo-osmolality and hyponatremia: Secondary | ICD-10-CM | POA: Diagnosis present

## 2020-08-28 DIAGNOSIS — Z818 Family history of other mental and behavioral disorders: Secondary | ICD-10-CM

## 2020-08-28 DIAGNOSIS — R400 Somnolence: Secondary | ICD-10-CM

## 2020-08-28 DIAGNOSIS — Z20822 Contact with and (suspected) exposure to covid-19: Secondary | ICD-10-CM | POA: Diagnosis present

## 2020-08-28 DIAGNOSIS — A419 Sepsis, unspecified organism: Secondary | ICD-10-CM | POA: Diagnosis present

## 2020-08-28 DIAGNOSIS — K449 Diaphragmatic hernia without obstruction or gangrene: Secondary | ICD-10-CM

## 2020-08-28 DIAGNOSIS — F419 Anxiety disorder, unspecified: Secondary | ICD-10-CM | POA: Diagnosis present

## 2020-08-28 DIAGNOSIS — J918 Pleural effusion in other conditions classified elsewhere: Secondary | ICD-10-CM | POA: Diagnosis present

## 2020-08-28 DIAGNOSIS — E222 Syndrome of inappropriate secretion of antidiuretic hormone: Secondary | ICD-10-CM | POA: Diagnosis present

## 2020-08-28 DIAGNOSIS — J181 Lobar pneumonia, unspecified organism: Secondary | ICD-10-CM | POA: Diagnosis not present

## 2020-08-28 DIAGNOSIS — R6521 Severe sepsis with septic shock: Secondary | ICD-10-CM | POA: Diagnosis present

## 2020-08-28 DIAGNOSIS — J9602 Acute respiratory failure with hypercapnia: Secondary | ICD-10-CM | POA: Diagnosis present

## 2020-08-28 DIAGNOSIS — R627 Adult failure to thrive: Secondary | ICD-10-CM | POA: Diagnosis present

## 2020-08-28 DIAGNOSIS — R931 Abnormal findings on diagnostic imaging of heart and coronary circulation: Secondary | ICD-10-CM | POA: Diagnosis not present

## 2020-08-28 DIAGNOSIS — J9601 Acute respiratory failure with hypoxia: Secondary | ICD-10-CM | POA: Diagnosis present

## 2020-08-28 DIAGNOSIS — I10 Essential (primary) hypertension: Secondary | ICD-10-CM | POA: Diagnosis not present

## 2020-08-28 DIAGNOSIS — Z90710 Acquired absence of both cervix and uterus: Secondary | ICD-10-CM

## 2020-08-28 DIAGNOSIS — I5030 Unspecified diastolic (congestive) heart failure: Secondary | ICD-10-CM | POA: Diagnosis not present

## 2020-08-28 DIAGNOSIS — G9341 Metabolic encephalopathy: Secondary | ICD-10-CM | POA: Diagnosis present

## 2020-08-28 DIAGNOSIS — Z4659 Encounter for fitting and adjustment of other gastrointestinal appliance and device: Secondary | ICD-10-CM

## 2020-08-28 DIAGNOSIS — Z66 Do not resuscitate: Secondary | ICD-10-CM | POA: Diagnosis not present

## 2020-08-28 DIAGNOSIS — I110 Hypertensive heart disease with heart failure: Secondary | ICD-10-CM | POA: Diagnosis present

## 2020-08-28 DIAGNOSIS — Z0189 Encounter for other specified special examinations: Secondary | ICD-10-CM

## 2020-08-28 DIAGNOSIS — M6282 Rhabdomyolysis: Secondary | ICD-10-CM | POA: Diagnosis present

## 2020-08-28 DIAGNOSIS — R54 Age-related physical debility: Secondary | ICD-10-CM | POA: Diagnosis present

## 2020-08-28 DIAGNOSIS — D649 Anemia, unspecified: Secondary | ICD-10-CM | POA: Diagnosis present

## 2020-08-28 DIAGNOSIS — I509 Heart failure, unspecified: Secondary | ICD-10-CM

## 2020-08-28 DIAGNOSIS — Z79899 Other long term (current) drug therapy: Secondary | ICD-10-CM

## 2020-08-28 DIAGNOSIS — E876 Hypokalemia: Secondary | ICD-10-CM | POA: Diagnosis present

## 2020-08-28 DIAGNOSIS — Z681 Body mass index (BMI) 19 or less, adult: Secondary | ICD-10-CM | POA: Diagnosis not present

## 2020-08-28 DIAGNOSIS — K219 Gastro-esophageal reflux disease without esophagitis: Secondary | ICD-10-CM | POA: Diagnosis present

## 2020-08-28 DIAGNOSIS — F17210 Nicotine dependence, cigarettes, uncomplicated: Secondary | ICD-10-CM | POA: Diagnosis present

## 2020-08-28 DIAGNOSIS — Z91013 Allergy to seafood: Secondary | ICD-10-CM

## 2020-08-28 DIAGNOSIS — J9620 Acute and chronic respiratory failure, unspecified whether with hypoxia or hypercapnia: Secondary | ICD-10-CM | POA: Diagnosis not present

## 2020-08-28 DIAGNOSIS — N179 Acute kidney failure, unspecified: Secondary | ICD-10-CM | POA: Diagnosis present

## 2020-08-28 DIAGNOSIS — E43 Unspecified severe protein-calorie malnutrition: Secondary | ICD-10-CM | POA: Diagnosis present

## 2020-08-28 DIAGNOSIS — R0682 Tachypnea, not elsewhere classified: Secondary | ICD-10-CM

## 2020-08-28 DIAGNOSIS — R0902 Hypoxemia: Secondary | ICD-10-CM

## 2020-08-28 LAB — I-STAT ARTERIAL BLOOD GAS, ED
Acid-base deficit: 4 mmol/L — ABNORMAL HIGH (ref 0.0–2.0)
Bicarbonate: 21.9 mmol/L (ref 20.0–28.0)
Calcium, Ion: 0.99 mmol/L — ABNORMAL LOW (ref 1.15–1.40)
HCT: 34 % — ABNORMAL LOW (ref 36.0–46.0)
Hemoglobin: 11.6 g/dL — ABNORMAL LOW (ref 12.0–15.0)
O2 Saturation: 96 %
Patient temperature: 98.1
Potassium: 2.8 mmol/L — ABNORMAL LOW (ref 3.5–5.1)
Sodium: 133 mmol/L — ABNORMAL LOW (ref 135–145)
TCO2: 23 mmol/L (ref 22–32)
pCO2 arterial: 39.6 mmHg (ref 32.0–48.0)
pH, Arterial: 7.349 — ABNORMAL LOW (ref 7.350–7.450)
pO2, Arterial: 87 mmHg (ref 83.0–108.0)

## 2020-08-28 LAB — I-STAT CHEM 8, ED
BUN: 33 mg/dL — ABNORMAL HIGH (ref 8–23)
Calcium, Ion: 1.06 mmol/L — ABNORMAL LOW (ref 1.15–1.40)
Chloride: 90 mmol/L — ABNORMAL LOW (ref 98–111)
Creatinine, Ser: 2 mg/dL — ABNORMAL HIGH (ref 0.44–1.00)
Glucose, Bld: 137 mg/dL — ABNORMAL HIGH (ref 70–99)
HCT: 42 % (ref 36.0–46.0)
Hemoglobin: 14.3 g/dL (ref 12.0–15.0)
Potassium: 3.5 mmol/L (ref 3.5–5.1)
Sodium: 128 mmol/L — ABNORMAL LOW (ref 135–145)
TCO2: 25 mmol/L (ref 22–32)

## 2020-08-28 LAB — URINALYSIS, ROUTINE W REFLEX MICROSCOPIC
Bilirubin Urine: NEGATIVE
Glucose, UA: 50 mg/dL — AB
Ketones, ur: 5 mg/dL — AB
Nitrite: NEGATIVE
Protein, ur: 30 mg/dL — AB
Specific Gravity, Urine: 1.012 (ref 1.005–1.030)
pH: 5 (ref 5.0–8.0)

## 2020-08-28 LAB — CBC WITH DIFFERENTIAL/PLATELET
Abs Immature Granulocytes: 0.16 10*3/uL — ABNORMAL HIGH (ref 0.00–0.07)
Abs Immature Granulocytes: 0.04 10*3/uL (ref 0.00–0.07)
Basophils Absolute: 0 10*3/uL (ref 0.0–0.1)
Basophils Absolute: 0 10*3/uL (ref 0.0–0.1)
Basophils Relative: 0 %
Basophils Relative: 0 %
Eosinophils Absolute: 0 10*3/uL (ref 0.0–0.5)
Eosinophils Absolute: 0 10*3/uL (ref 0.0–0.5)
Eosinophils Relative: 0 %
Eosinophils Relative: 0 %
HCT: 38.1 % (ref 36.0–46.0)
HCT: 39.2 % (ref 36.0–46.0)
Hemoglobin: 13.1 g/dL (ref 12.0–15.0)
Hemoglobin: 13.1 g/dL (ref 12.0–15.0)
Immature Granulocytes: 1 %
Immature Granulocytes: 0 %
Lymphocytes Relative: 3 %
Lymphocytes Relative: 6 %
Lymphs Abs: 0.5 10*3/uL — ABNORMAL LOW (ref 0.7–4.0)
Lymphs Abs: 0.7 10*3/uL (ref 0.7–4.0)
MCH: 29.7 pg (ref 26.0–34.0)
MCH: 29.9 pg (ref 26.0–34.0)
MCHC: 34.4 g/dL (ref 30.0–36.0)
MCHC: 33.4 g/dL (ref 30.0–36.0)
MCV: 86.4 fL (ref 80.0–100.0)
MCV: 89.5 fL (ref 80.0–100.0)
Monocytes Absolute: 1.4 10*3/uL — ABNORMAL HIGH (ref 0.1–1.0)
Monocytes Absolute: 1.3 10*3/uL — ABNORMAL HIGH (ref 0.1–1.0)
Monocytes Relative: 7 %
Monocytes Relative: 10 %
Neutro Abs: 18.4 10*3/uL — ABNORMAL HIGH (ref 1.7–7.7)
Neutro Abs: 11.1 10*3/uL — ABNORMAL HIGH (ref 1.7–7.7)
Neutrophils Relative %: 89 %
Neutrophils Relative %: 84 %
Platelets: 479 10*3/uL — ABNORMAL HIGH (ref 150–400)
Platelets: 415 10*3/uL — ABNORMAL HIGH (ref 150–400)
RBC: 4.41 MIL/uL (ref 3.87–5.11)
RBC: 4.38 MIL/uL (ref 3.87–5.11)
RDW: 12.6 % (ref 11.5–15.5)
RDW: 13.1 % (ref 11.5–15.5)
WBC: 20.5 10*3/uL — ABNORMAL HIGH (ref 4.0–10.5)
WBC: 13.2 10*3/uL — ABNORMAL HIGH (ref 4.0–10.5)
nRBC: 0 % (ref 0.0–0.2)
nRBC: 0 % (ref 0.0–0.2)

## 2020-08-28 LAB — COMPREHENSIVE METABOLIC PANEL
ALT: 83 U/L — ABNORMAL HIGH (ref 0–44)
AST: 97 U/L — ABNORMAL HIGH (ref 15–41)
Albumin: 3.8 g/dL (ref 3.5–5.0)
Alkaline Phosphatase: 128 U/L — ABNORMAL HIGH (ref 38–126)
Anion gap: 20 — ABNORMAL HIGH (ref 5–15)
BUN: 32 mg/dL — ABNORMAL HIGH (ref 8–23)
CO2: 22 mmol/L (ref 22–32)
Calcium: 9.3 mg/dL (ref 8.9–10.3)
Chloride: 88 mmol/L — ABNORMAL LOW (ref 98–111)
Creatinine, Ser: 2.06 mg/dL — ABNORMAL HIGH (ref 0.44–1.00)
GFR, Estimated: 24 mL/min — ABNORMAL LOW (ref >60)
Glucose, Bld: 145 mg/dL — ABNORMAL HIGH (ref 70–99)
Potassium: 4.3 mmol/L (ref 3.5–5.1)
Sodium: 130 mmol/L — ABNORMAL LOW (ref 135–145)
Total Bilirubin: 0.9 mg/dL (ref 0.3–1.2)
Total Protein: 6.3 g/dL — ABNORMAL LOW (ref 6.5–8.1)

## 2020-08-28 LAB — PROTIME-INR
INR: 0.9 (ref 0.8–1.2)
Prothrombin Time: 11.7 seconds (ref 11.4–15.2)

## 2020-08-28 LAB — CK: Total CK: 1622 U/L — ABNORMAL HIGH (ref 38–234)

## 2020-08-28 LAB — LACTIC ACID, PLASMA: Lactic Acid, Venous: 5 mmol/L (ref 0.5–1.9)

## 2020-08-28 LAB — APTT: aPTT: 25 seconds (ref 24–36)

## 2020-08-28 LAB — RESPIRATORY PANEL BY RT PCR (FLU A&B, COVID)
Influenza A by PCR: NEGATIVE
Influenza B by PCR: NEGATIVE
SARS Coronavirus 2 by RT PCR: NEGATIVE

## 2020-08-28 LAB — CBG MONITORING, ED
Glucose-Capillary: 144 mg/dL — ABNORMAL HIGH (ref 70–99)
Glucose-Capillary: 140 mg/dL — ABNORMAL HIGH (ref 70–99)

## 2020-08-28 MED ORDER — FENTANYL CITRATE (PF) 100 MCG/2ML IJ SOLN: 100.0000 ug | Freq: Once | INTRAMUSCULAR | Status: DC

## 2020-08-28 MED ORDER — MIDAZOLAM HCL 2 MG/2ML IJ SOLN
1.0000 mg | INTRAMUSCULAR | Status: DC | PRN
  Administered 2020-08-28: 1 mg via INTRAVENOUS
  Filled 2020-08-28: qty 2

## 2020-08-28 MED ORDER — ETOMIDATE 2 MG/ML IV SOLN
INTRAVENOUS | Status: AC | PRN
  Administered 2020-08-28: 10 mg via INTRAVENOUS

## 2020-08-28 MED ORDER — FENTANYL CITRATE (PF) 100 MCG/2ML IJ SOLN: 100.0000 ug | Freq: Once | INTRAMUSCULAR | Status: AC

## 2020-08-28 MED ORDER — LACTATED RINGERS IV SOLN: INTRAVENOUS | Status: DC

## 2020-08-28 MED ORDER — SODIUM CHLORIDE 0.9 % IV BOLUS (SEPSIS)
500.0000 mL | Freq: Once | INTRAVENOUS | Status: AC
  Administered 2020-08-28: 500 mL via INTRAVENOUS

## 2020-08-28 MED ORDER — INSULIN ASPART 100 UNIT/ML ~~LOC~~ SOLN
0.0000 [IU] | SUBCUTANEOUS | Status: DC
  Administered 2020-08-29 – 2020-09-03 (×9): 1 [IU] via SUBCUTANEOUS
  Administered 2020-09-03: 2 [IU] via SUBCUTANEOUS
  Administered 2020-09-03: 1 [IU] via SUBCUTANEOUS

## 2020-08-28 MED ORDER — HEPARIN SODIUM (PORCINE) 5000 UNIT/ML IJ SOLN
5000.0000 [IU] | Freq: Three times a day (TID) | INTRAMUSCULAR | Status: DC
  Administered 2020-08-29 – 2020-09-05 (×22): 5000 [IU] via SUBCUTANEOUS
  Filled 2020-08-28 (×21): qty 1

## 2020-08-28 MED ORDER — FENTANYL BOLUS VIA INFUSION
25.0000 ug | INTRAVENOUS | Status: DC | PRN
  Administered 2020-08-31 – 2020-09-02 (×8): 25 ug via INTRAVENOUS
  Filled 2020-08-28: qty 25

## 2020-08-28 MED ORDER — FAMOTIDINE 40 MG/5ML PO SUSR
20.0000 mg | Freq: Two times a day (BID) | ORAL | Status: DC
  Administered 2020-08-29: 20 mg
  Filled 2020-08-28 (×2): qty 2.5

## 2020-08-28 MED ORDER — FENTANYL 2500MCG IN NS 250ML (10MCG/ML) PREMIX INFUSION
25.0000 ug/h | INTRAVENOUS | Status: DC
  Administered 2020-08-28: 50 ug/h via INTRAVENOUS
  Administered 2020-08-29 – 2020-08-30 (×3): 200 ug/h via INTRAVENOUS
  Administered 2020-08-31: 275 ug/h via INTRAVENOUS
  Administered 2020-08-31: 200 ug/h via INTRAVENOUS
  Filled 2020-08-28 (×6): qty 250

## 2020-08-28 MED ORDER — SODIUM CHLORIDE 0.9 % IV BOLUS (SEPSIS)
1000.0000 mL | Freq: Once | INTRAVENOUS | Status: AC
  Administered 2020-08-28: 1000 mL via INTRAVENOUS

## 2020-08-28 MED ORDER — ROCURONIUM BROMIDE 50 MG/5ML IV SOLN
INTRAVENOUS | Status: AC | PRN
  Administered 2020-08-28: 75 mg via INTRAVENOUS

## 2020-08-28 MED ORDER — MIDAZOLAM HCL 2 MG/2ML IJ SOLN: 1.0000 mg | INTRAMUSCULAR | Status: DC | PRN

## 2020-08-28 MED ORDER — POLYETHYLENE GLYCOL 3350 17 G PO PACK: 17.0000 g | PACK | Freq: Every day | ORAL | Status: DC | PRN

## 2020-08-28 MED ORDER — DOCUSATE SODIUM 100 MG PO CAPS: 100.0000 mg | ORAL_CAPSULE | Freq: Two times a day (BID) | ORAL | Status: DC | PRN

## 2020-08-28 MED ORDER — SODIUM CHLORIDE 0.9 % IV SOLN
500.0000 mg | INTRAVENOUS | Status: AC
  Administered 2020-08-28 – 2020-09-03 (×7): 500 mg via INTRAVENOUS
  Filled 2020-08-28 (×7): qty 500

## 2020-08-28 MED ORDER — FENTANYL CITRATE (PF) 100 MCG/2ML IJ SOLN
INTRAMUSCULAR | Status: AC
  Administered 2020-08-28: 100 ug via INTRAVENOUS
  Filled 2020-08-28: qty 2

## 2020-08-28 MED ORDER — POTASSIUM CHLORIDE 10 MEQ/100ML IV SOLN
10.0000 meq | INTRAVENOUS | Status: AC
  Administered 2020-08-29 (×3): 10 meq via INTRAVENOUS
  Filled 2020-08-28 (×3): qty 100

## 2020-08-28 MED ORDER — FENTANYL CITRATE (PF) 100 MCG/2ML IJ SOLN: 50.0000 ug | Freq: Once | INTRAMUSCULAR | Status: DC

## 2020-08-28 MED ORDER — SODIUM CHLORIDE 0.9 % IV SOLN
2.0000 g | INTRAVENOUS | Status: AC
  Administered 2020-08-28 – 2020-09-03 (×7): 2 g via INTRAVENOUS
  Filled 2020-08-28 (×2): qty 2
  Filled 2020-08-28: qty 20
  Filled 2020-08-28: qty 2
  Filled 2020-08-28: qty 20
  Filled 2020-08-28 (×2): qty 2

## 2020-08-28 NOTE — H&P (Signed)
NAME:  [name], MRN:  [id number], DOB:  1951/04/13, LOS: 0 ADMISSION DATE:  08/28/2020, CONSULTATION DATE:  08/28/2020 REFERRING MD:  ED, CHIEF COMPLAINT:  Found down   Brief History   69 yo F who presented after being found down, hypoxemic the ED and intubated.  Admitting to ICU.   History of present illness   69 yo F with a history of hiatal hernia, schatski ring, hyponatremia who was found down at home by her brother after she was last known normal yesterday.  When EMS arrived, her o2 sats were in the 20s and had difficulty breathing.  She was thus placed on NRB, and when assessed in the ED, decision was made to intubate.   CXR showed right sided atelectasis and likely infiltrate.    I spoke to her brother and primary caregiver at bedside.  He says that she has had a rough year, with frequent admissions for lightheadedness and confusion and found to be hyponatremic and hypokalemic.  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  She has lost appetite, has episodes of dry heaving and vomiting- usually when she has low Na and low K.  Normally, she has a gradual clinical decline before admission for hyponatremia, however the events over the last 24 hours were very acute.  Patient lives alone, manages her own meds.  She has had substantial weight loss over the last 1 year.     Past Medical History  Hiatal hernia Hyponatremia Scatski ring Current smoker  Significant Hospital Events   Intubated in ED 10/17  Consults:    Procedures:  Intubation 10/17  Significant Diagnostic Tests:  CXR: 10/17: . Right mid to lower lung field density, likely atelectasis. Aspiration is not excluded. CT Head>> CT Chest>>  Micro Data:  Bcx>> Trach aspirate >>  Antimicrobials:  Ceftriaxone 10/17 >> Azithromycin 10/17 >>  Interim history/subjective:    Objective   Blood pressure  124/90, pulse (!) 149, resp. rate 20, height 5\' 2"  (1.575 m), weight 36.3 kg, SpO2 100 %.    Vent Mode: PRVC FiO2 (%):  [100 %] 100 % Set Rate:  [20 bmp] 20 bmp Vt Set:  [400 mL] 400 mL PEEP:  [10 cmH20] 10 cmH20 Plateau Pressure:  [23 cmH20] 23 cmH20   Intake/Output Summary (Last 24 hours) at 08/28/2020 2128 Last data filed at 08/28/2020 2045 Gross per 24 hour  Intake 600 ml  Output --  Net 600 ml   Filed Weights   08/28/20 2003  Weight: 36.3 kg    Examination: General: not alert, unresponsive to noxious stimuli, intubated on fentanyl drip HENT: pupils equal, small, reactive Lungs: diminished breath sounds on right base.  Otherwise clear Cardiovascular: normal rate and rhythm.  Systolic murmur Abdomen: soft, nontender nondistended Extremities: slightly mottled.  No edema Neuro: as above, unresponsive to noxious or verbal stimuli GU: foley in place.   Resolved Hospital Problem list     Assessment & Plan:   69 yo F with a recent significant functional decline, episodes of hyponatremia (due to thiazides, hypovolemia, and ?SIADH), weight loss who presents after being found down and hypoxic, intubated in the ED for airway protection.   # Acute hypoxic respiratory failure:  Likely aspirated based on CXR, could have pneumonia- and will treat for CAP.   - ceftriaxone, azithromycin  - vent management, VAP bundle - daily SBT/SAT - RASS goal -1-0 -  fentanyl for sedation - CT Chest- namely to look for lung cancer- recurrent hyponatremia ? SIADH in a smoker  # Acute encephalopathy: Normal sodium this time here. Unclear etiology- may be due to hypoxia and/or metabolic abnormalities.  Will obtain CT head  # AKI # Hypokalemia # Hyponatremia S/p 1.5L bolus in the ED as well as some additional LR infusion. Repleting K with IV, will add on PO once get reliable oral access - trend BMP, trend CK (elevated at 1600 on admission)  # Failure to thrive: unintentional weight loss,  admissions for metabolic abnormalities.  Could be due to her large hiatal hernia, and esophageal web.  However, underlying cancer and /or paraneoplastic syndromes from underlying cancer are possibilities - will need to watch for refeeding when able to feed - CT Chest and CT head     Best practice:  Diet: NPO Pain/Anxiety/Delirium protocol (if indicated): yes VAP protocol (if indicated): yes DVT prophylaxis: heparin subq GI prophylaxis: pepcid Glucose control: SSI q4 Mobility: when appropriate Code Status: Full Family Communication: spoke with brother and POA at the bedside Disposition: ICU  Labs   CBC: Recent Labs  Lab 08/28/20 1922 08/28/20 2003 08/28/20 2103  WBC 20.5*  --   --   NEUTROABS 18.4*  --   --   HGB 13.1 14.3 11.6*  HCT 38.1 42.0 34.0*  MCV 86.4  --   --   PLT 479*  --   --     Basic Metabolic Panel: Recent Labs  Lab 08/28/20 1922 08/28/20 2003 08/28/20 2103  NA 130* 128* 133*  K 4.3 3.5 2.8*  CL 88* 90*  --   CO2 22  --   --   GLUCOSE 145* 137*  --   BUN 32* 33*  --   CREATININE 2.06* 2.00*  --   CALCIUM 9.3  --   --    GFR: Estimated Creatinine Clearance: 15.6 mL/min (A) (by C-G formula based on SCr of 2 mg/dL (H)). Recent Labs  Lab 08/28/20 1922  WBC 20.5*  LATICACIDVEN 5.0*    Liver Function Tests: Recent Labs  Lab 08/28/20 1922  AST 97*  ALT 83*  ALKPHOS 128*  BILITOT 0.9  PROT 6.3*  ALBUMIN 3.8   No results for input(s): LIPASE, AMYLASE in the last 168 hours. No results for input(s): AMMONIA in the last 168 hours.  ABG    Component Value Date/Time   PHART 7.349 (L) 08/28/2020 2103   PCO2ART 39.6 08/28/2020 2103   PO2ART 87 08/28/2020 2103   HCO3 21.9 08/28/2020 2103   TCO2 23 08/28/2020 2103   ACIDBASEDEF 4.0 (H) 08/28/2020 2103   O2SAT 96.0 08/28/2020 2103     Coagulation Profile: Recent Labs  Lab 08/28/20 1922  INR 0.9    Cardiac Enzymes: Recent Labs  Lab 08/28/20 1922  CKTOTAL 1,622*    HbA1C: Hgb  A1c MFr Bld  Date/Time Value Ref Range Status  07/06/2014 11:22 AM 6.4 4.6 - 6.5 % Final    Comment:    Glycemic Control Guidelines for People with Diabetes:Non Diabetic:  <6%Goal of Therapy: <7%Additional Action Suggested:  >8%   09/11/2013 08:02 AM 6.5 4.6 - 6.5 % Final    Comment:    Glycemic Control Guidelines for People with Diabetes:Non Diabetic:  <6%Goal of Therapy: <7%Additional Action Suggested:  >8%     CBG: Recent Labs  Lab 08/28/20 1916 08/28/20 1949  GLUCAP 144* 140*    Review of Systems:   Unable to review systems  due to patient being intubated and sedated.  Past Medical History  She,  has a past medical history of Abdominal pain, epigastric, Acid reflux, Anxiety, GERD (gastroesophageal reflux disease), Heart murmur, Hiatal hernia, Hypertension, Hyponatremia (02/10/2020), Schatzki's ring, and Ulcer.   Surgical History    Past Surgical History:  Procedure Laterality Date  . ABDOMINAL HYSTERECTOMY     complete for fibroids  . KNEE ARTHROSCOPY     right knee  . shoulder repalcement     right  . TONSILLECTOMY AND ADENOIDECTOMY       Social History   reports that she has been smoking cigarettes. She has been smoking about 0.50 packs per day. She has never used smokeless tobacco. She reports that she does not drink alcohol and does not use drugs.   Family History   Her family history includes Anxiety disorder in her mother; Colon cancer in her maternal grandmother; Prostate cancer in her father.   Allergies Allergies  Allergen Reactions  . Iodine Shortness Of Breath and Rash  . Thiazide-Type Diuretics Other (See Comments)    Severe hyponatremia July 2021  . Shellfish Allergy     Causes rash     Home Medications  Prior to Admission medications   Medication Sig Start Date End Date Taking? Authorizing Provider  amLODipine (NORVASC) 10 MG tablet Take 1 tablet (10 mg total) by mouth daily. 05/20/20 06/19/20  Antonieta Pert, MD  baclofen (LIORESAL) 10 MG tablet Take  10 mg by mouth daily.    [provider]  bisoprolol (ZEBETA) 5 MG tablet Take 1 tablet (5 mg total) by mouth daily. 05/21/20 06/20/20  Antonieta Pert, MD  lisinopril (ZESTRIL) 40 MG tablet Take 40 mg by mouth daily. 04/29/20   [provider]     Critical care time: 51

## 2020-08-28 NOTE — ED Notes (Signed)
Call from radiologist and EDP that [name] initially inserted may be in lung. Tube completely removed. 12Fr inserted as NG. Portable xray placed to confirm

## 2020-08-28 NOTE — Code Documentation (Signed)
RT and EDP at bedside preparing for intubation

## 2020-08-28 NOTE — ED Provider Notes (Signed)
[facility] EMERGENCY DEPARTMENT Provider Note   CSN: [id number] Arrival date & time: 08/28/20  1911     History No chief complaint on file.   [name] is a 70 y.o. female.  69 yo F with a significant past medical history of hyponatremia comes in with a chief complaint of altered mental status.  The patient was last seen normal yesterday and then she did not answer her phone multiple times with phone calls from her brother.  When he arrived at her house she was unresponsive and seem to be having difficulty breathing.  EMS was called and had an oxygen saturation in the 60s.  Placed on nonrebreather with improvement into the 80s.  Brought to the ED for evaluation.  Level 5 caveat altered mental status.  The history is provided by the EMS personnel.       Past Medical History:  Diagnosis Date  . Abdominal pain, epigastric   . Acid reflux    Pt was born with acute acid  . Anxiety   . GERD (gastroesophageal reflux disease)   . Heart murmur   . Hiatal hernia   . Hypertension   . Hyponatremia 02/10/2020  . Schatzki's ring   . Ulcer    8 years ago    Patient Active Problem List   Diagnosis Date Noted  . Generalized weakness 05/19/2020  . Hypokalemia 05/19/2020  . Hyponatremia 02/03/2020  . Ambulatory dysfunction   . Prediabetes 03/23/2013  . Hyperlipidemia 03/23/2013  . Colon cancer screening 11/18/2012  . Hypertension 09/16/2012  . GERD (gastroesophageal reflux disease) 09/16/2012  . Tobacco use 09/16/2012    Past Surgical History:  Procedure Laterality Date  . ABDOMINAL HYSTERECTOMY     complete for fibroids  . KNEE ARTHROSCOPY     right knee  . shoulder repalcement     right  . TONSILLECTOMY AND ADENOIDECTOMY       OB History   No obstetric history on file.     Family History  Problem Relation Age of Onset  . Prostate cancer Father   . Anxiety disorder Mother   . Colon cancer Maternal Grandmother     Social History    Tobacco Use  . Smoking status: Current Every Day Smoker    Packs/day: 0.50    Types: Cigarettes  . Smokeless tobacco: Never Used  . Tobacco comment: almost a pack a day;   Vaping Use  . Vaping Use: Never used  Substance Use Topics  . Alcohol use: No    Alcohol/week: 0.0 standard drinks  . Drug use: No    Home Medications Prior to Admission medications   Medication Sig Start Date End Date Taking? Authorizing Provider  amLODipine (NORVASC) 10 MG tablet Take 1 tablet (10 mg total) by mouth daily. 05/20/20 06/19/20  Antonieta Pert, MD  baclofen (LIORESAL) 10 MG tablet Take 10 mg by mouth daily.    [provider]  bisoprolol (ZEBETA) 5 MG tablet Take 1 tablet (5 mg total) by mouth daily. 05/21/20 06/20/20  Antonieta Pert, MD  lisinopril (ZESTRIL) 40 MG tablet Take 40 mg by mouth daily. 04/29/20   [provider]    Allergies    Iodine, Thiazide-type diuretics, and Shellfish allergy  Review of Systems   Review of Systems  Unable to perform ROS: Mental status change  Constitutional: Negative for chills and fever.  HENT: Negative for congestion and rhinorrhea.   Eyes: Negative for redness and visual disturbance.  Respiratory: Negative for  shortness of breath and wheezing.   Cardiovascular: Negative for chest pain and palpitations.  Gastrointestinal: Negative for nausea and vomiting.  Genitourinary: Negative for dysuria and urgency.  Musculoskeletal: Negative for arthralgias and myalgias.  Skin: Negative for pallor and wound.  Neurological: Negative for dizziness and headaches.    Physical Exam Updated Vital Signs BP (!) 145/96   Ht 5\' 2"  (1.575 m)   Wt 36.3 kg   BMI 14.63 kg/m   Physical Exam Vitals and nursing note reviewed.  Constitutional:      General: She is not in acute distress.    Appearance: She is well-developed. She is not diaphoretic.  HENT:     Head: Normocephalic and atraumatic.  Eyes:     Pupils: Pupils are equal, round, and reactive to light.   Cardiovascular:     Rate and Rhythm: Regular rhythm. Tachycardia present.     Heart sounds: No murmur heard.  No friction rub. No gallop.   Pulmonary:     Effort: Respiratory distress present.     Breath sounds: No wheezing or rales.     Comments: Tachypnea Abdominal:     General: There is no distension.     Palpations: Abdomen is soft.     Tenderness: There is no abdominal tenderness.  Musculoskeletal:        General: No tenderness.     Cervical back: Normal range of motion and neck supple.  Skin:    General: Skin is warm and dry.  Neurological:     Comments: Patient's eyes open spontaneously.  Not following commands.  Sleepy.     ED Results / Procedures / Treatments   Labs (all labs ordered are listed, but only abnormal results are displayed) Labs Reviewed  LACTIC ACID, PLASMA - Abnormal; Notable for the following components:      Result Value   Lactic Acid, Venous 5.0 (*)    All other components within normal limits  COMPREHENSIVE METABOLIC PANEL - Abnormal; Notable for the following components:   Sodium 130 (*)    Chloride 88 (*)    Glucose, Bld 145 (*)    BUN 32 (*)    Creatinine, Ser 2.06 (*)    Total Protein 6.3 (*)    AST 97 (*)    ALT 83 (*)    Alkaline Phosphatase 128 (*)    GFR, Estimated 24 (*)    Anion gap 20 (*)    All other components within normal limits  CBC WITH DIFFERENTIAL/PLATELET - Abnormal; Notable for the following components:   WBC 20.5 (*)    Platelets 479 (*)    Neutro Abs 18.4 (*)    Lymphs Abs 0.5 (*)    Monocytes Absolute 1.4 (*)    Abs Immature Granulocytes 0.16 (*)    All other components within normal limits  URINALYSIS, ROUTINE W REFLEX MICROSCOPIC - Abnormal; Notable for the following components:   APPearance HAZY (*)    Glucose, UA 50 (*)    Hgb urine dipstick MODERATE (*)    Ketones, ur 5 (*)    Protein, ur 30 (*)    Leukocytes,Ua TRACE (*)    Bacteria, UA RARE (*)    All other components within normal limits  CK -  Abnormal; Notable for the following components:   Total CK 1,622 (*)    All other components within normal limits  CBG MONITORING, ED - Abnormal; Notable for the following components:   Glucose-Capillary 144 (*)    All other components  within normal limits  I-STAT CHEM 8, ED - Abnormal; Notable for the following components:   Sodium 128 (*)    Chloride 90 (*)    BUN 33 (*)    Creatinine, Ser 2.00 (*)    Glucose, Bld 137 (*)    Calcium, Ion 1.06 (*)    All other components within normal limits  CBG MONITORING, ED - Abnormal; Notable for the following components:   Glucose-Capillary 140 (*)    All other components within normal limits  RESPIRATORY PANEL BY RT PCR (FLU A&B, COVID)  CULTURE, BLOOD (ROUTINE X 2)  CULTURE, BLOOD (ROUTINE X 2)  URINE CULTURE  PROTIME-INR  APTT  LACTIC ACID, PLASMA    EKG EKG Interpretation  Date/Time:  Sunday August 28 2020 19:18:24 EDT Ventricular Rate:  106 PR Interval:    QRS Duration: 76 QT Interval:  349 QTC Calculation: 464 R Axis:   49 Text Interpretation: Sinus tachycardia Probable left atrial enlargement Probable anteroseptal infarct, old No significant change since last tracing Confirmed by Deno Etienne [phone]) on 08/28/2020 7:46:03 PM   Radiology DG Chest Port 1 View  Result Date: 08/28/2020 CLINICAL DATA:  69 year old female with concern for sepsis. EXAM: PORTABLE CHEST 1 VIEW COMPARISON:  Chest radiograph dated 02/10/2020 and CT dated 02/10/2020 FINDINGS: Endotracheal tube with tip above the carina. An enteric tube is noted with side-port over the mediastinum and tip at the level of the diaphragm to the right of the midline. Although the tip of the enteric tube may be in the distal esophagus the right main stem bronchus intubation is not excluded. Recommend retraction and repositioning of the NG. There is right mid to lower lung field density with overall decreased right lung volume, likely atelectasis. Pneumonia or aspiration is not  excluded. The left lung is clear. No pleural effusion pneumothorax. The cardiac silhouette is within limits. Atherosclerotic calcification of the aorta. No acute osseous pathology. Degenerative changes of the spine. Right shoulder arthroplasty. IMPRESSION: 1. Endotracheal tube with tip above the carina. 2. Possible positioning of the NG in the right mainstem bronchus. Recommend retraction and repositioning of the NG. 3. Right mid to lower lung field density, likely atelectasis. Aspiration is not excluded. These results were called by telephone at the time of interpretation on 08/28/2020 at 8:06 pm to nurse Rountree who verbally acknowledged these results. Electronically Signed   By: Anner Crete M.D.   On: 08/28/2020 20:10    Procedures Procedure Name: Intubation Date/Time: 08/28/2020 8:44 PM Performed by: Deno Etienne, DO Pre-anesthesia Checklist: Patient identified, Patient being monitored, Emergency Drugs available, Timeout performed and Suction available Oxygen Delivery Method: Non-rebreather mask Preoxygenation: Pre-oxygenation with 100% oxygen Induction Type: Rapid sequence Ventilation: Mask ventilation without difficulty Laryngoscope Size: Glidescope and 3 Grade View: Grade I Tube size: 7.5 mm Number of attempts: 1 Airway Equipment and Method: Video-laryngoscopy Placement Confirmation: ETT inserted through vocal cords under direct vision,  CO2 detector and Breath sounds checked- equal and bilateral Secured at: 24 cm Tube secured with: ETT holder Dental Injury: Teeth and Oropharynx as per pre-operative assessment  Difficulty Due To: Difficulty was anticipated Future Recommendations: Recommend- induction with short-acting agent, and alternative techniques readily available      (including critical care time)  Medications Ordered in ED Medications  lactated ringers infusion ( Intravenous New Bag/Given 08/28/20 2000)  cefTRIAXone (ROCEPHIN) 2 g in sodium chloride 0.9 % 100 mL  IVPB (0 g Intravenous Stopped 08/28/20 2045)  azithromycin (ZITHROMAX) 500 mg in sodium chloride 0.9 %  250 mL IVPB (500 mg Intravenous New Bag/Given 08/28/20 2001)  fentaNYL (SUBLIMAZE) injection 50 mcg (has no administration in time range)  fentaNYL 2541mcg in NS 249mL (86mcg/ml) infusion-PREMIX (50 mcg/hr Intravenous New Bag/Given 08/28/20 2025)  fentaNYL (SUBLIMAZE) bolus via infusion 25 mcg (has no administration in time range)  midazolam (VERSED) injection 1 mg (has no administration in time range)  midazolam (VERSED) injection 1 mg (has no administration in time range)  fentaNYL (SUBLIMAZE) injection 100 mcg (100 mcg Intravenous Not Given 08/28/20 2045)  sodium chloride 0.9 % bolus 1,000 mL (1,000 mLs Intravenous New Bag/Given 08/28/20 2000)    And  sodium chloride 0.9 % bolus 500 mL (0 mLs Intravenous Stopped 08/28/20 2045)  etomidate (AMIDATE) injection (10 mg Intravenous Given 08/28/20 1934)  rocuronium (ZEMURON) injection (75 mg Intravenous Given 08/28/20 1935)  fentaNYL (SUBLIMAZE) injection 100 mcg (100 mcg Intravenous Given 08/28/20 1942)    ED Course  I have reviewed the triage vital signs and the nursing notes.  Pertinent labs & imaging results that were available during my care of the patient were reviewed by me and considered in my medical decision making (see chart for details).    MDM Rules/Calculators/A&P                          69 yo F with a chief complaints of altered mental status.  The patient was found on the ground today by her brother after he was last in touch with her yesterday afternoon.  The patient found to be profoundly hypoxic by EMS.  Patient appeared very sleepy on initial exam and with her increased work of breathing the decision was made to intubate her.  Chest x-ray viewed by me with likely right-sided pneumonia.  I discussed the case with the patient's family prior to intubation and they did confirm that she was a full code.  Reportedly she has had  episodes like this in the past where she becomes hyponatremic and confused and then aspirates.  She has made a code sepsis on arrival.  Given community-acquired pneumonia antibiotics.  30 cc/kg of IV fluids.  Initial lactate of 5.  Sodium is not significantly low here.  She does have an AKI.  Discussed with critical care for admission.  CRITICAL CARE Performed by: Cecilio Asper   Total critical care time: 80 minutes  Critical care time was exclusive of separately billable procedures and treating other patients.  Critical care was necessary to treat or prevent imminent or life-threatening deterioration.  Critical care was time spent personally by me on the following activities: development of treatment plan with patient and/or surrogate as well as nursing, discussions with consultants, evaluation of patient's response to treatment, examination of patient, obtaining history from patient or surrogate, ordering and performing treatments and interventions, ordering and review of laboratory studies, ordering and review of radiographic studies, pulse oximetry and re-evaluation of patient's condition.  The patients results and plan were reviewed and discussed.   Any x-rays performed were independently reviewed by myself.   Differential diagnosis were considered with the presenting HPI.  Medications  lactated ringers infusion ( Intravenous New Bag/Given 08/28/20 2000)  cefTRIAXone (ROCEPHIN) 2 g in sodium chloride 0.9 % 100 mL IVPB (0 g Intravenous Stopped 08/28/20 2045)  azithromycin (ZITHROMAX) 500 mg in sodium chloride 0.9 % 250 mL IVPB (500 mg Intravenous New Bag/Given 08/28/20 2001)  fentaNYL (SUBLIMAZE) injection 50 mcg (has no administration in time range)  fentaNYL 2562mcg  in NS 247mL (47mcg/ml) infusion-PREMIX (50 mcg/hr Intravenous New Bag/Given 08/28/20 2025)  fentaNYL (SUBLIMAZE) bolus via infusion 25 mcg (has no administration in time range)  midazolam (VERSED) injection 1 mg  (has no administration in time range)  midazolam (VERSED) injection 1 mg (has no administration in time range)  fentaNYL (SUBLIMAZE) injection 100 mcg (100 mcg Intravenous Not Given 08/28/20 2045)  sodium chloride 0.9 % bolus 1,000 mL (1,000 mLs Intravenous New Bag/Given 08/28/20 2000)    And  sodium chloride 0.9 % bolus 500 mL (0 mLs Intravenous Stopped 08/28/20 2045)  etomidate (AMIDATE) injection (10 mg Intravenous Given 08/28/20 1934)  rocuronium (ZEMURON) injection (75 mg Intravenous Given 08/28/20 1935)  fentaNYL (SUBLIMAZE) injection 100 mcg (100 mcg Intravenous Given 08/28/20 1942)    Vitals:   08/28/20 1935 08/28/20 2003  BP: (!) 145/96   Weight:  36.3 kg  Height:  5\' 2"  (1.575 m)    Final diagnoses:  Acute respiratory failure with hypoxia (HCC)  Somnolence  Community acquired pneumonia of right lower lobe of lung    Admission/ observation were discussed with the admitting physician, patient and/or family and they are comfortable with the plan.    Final Clinical Impression(s) / ED Diagnoses Final diagnoses:  Acute respiratory failure with hypoxia ([place])  Somnolence  Community acquired pneumonia of right lower lobe of lung    Rx / DC Orders ED Discharge Orders    None       Deno Etienne, DO 08/28/20 2046

## 2020-08-29 ENCOUNTER — Inpatient Hospital Stay (HOSPITAL_COMMUNITY): Payer: Medicare Other

## 2020-08-29 ENCOUNTER — Encounter (HOSPITAL_COMMUNITY): Payer: Self-pay | Admitting: Pulmonary Disease

## 2020-08-29 ENCOUNTER — Inpatient Hospital Stay (HOSPITAL_COMMUNITY)
Admit: 2020-08-29 | Discharge: 2020-08-29 | Disposition: A | Discharge status: Home / Self Care | Payer: Medicare Other | Attending: Pulmonary Disease | Admitting: Pulmonary Disease

## 2020-08-29 DIAGNOSIS — E871 Hypo-osmolality and hyponatremia: Secondary | ICD-10-CM

## 2020-08-29 DIAGNOSIS — R4182 Altered mental status, unspecified: Secondary | ICD-10-CM | POA: Diagnosis not present

## 2020-08-29 DIAGNOSIS — J9620 Acute and chronic respiratory failure, unspecified whether with hypoxia or hypercapnia: Secondary | ICD-10-CM

## 2020-08-29 DIAGNOSIS — J189 Pneumonia, unspecified organism: Secondary | ICD-10-CM | POA: Insufficient documentation

## 2020-08-29 DIAGNOSIS — R627 Adult failure to thrive: Secondary | ICD-10-CM | POA: Insufficient documentation

## 2020-08-29 DIAGNOSIS — E440 Moderate protein-calorie malnutrition: Secondary | ICD-10-CM | POA: Diagnosis present

## 2020-08-29 DIAGNOSIS — J9690 Respiratory failure, unspecified, unspecified whether with hypoxia or hypercapnia: Secondary | ICD-10-CM | POA: Insufficient documentation

## 2020-08-29 DIAGNOSIS — R400 Somnolence: Secondary | ICD-10-CM | POA: Insufficient documentation

## 2020-08-29 LAB — BASIC METABOLIC PANEL
Anion gap: 18 — ABNORMAL HIGH (ref 5–15)
Anion gap: 16 — ABNORMAL HIGH (ref 5–15)
Anion gap: 12 (ref 5–15)
BUN: 28 mg/dL — ABNORMAL HIGH (ref 8–23)
BUN: 30 mg/dL — ABNORMAL HIGH (ref 8–23)
BUN: 47 mg/dL — ABNORMAL HIGH (ref 8–23)
CO2: 18 mmol/L — ABNORMAL LOW (ref 22–32)
CO2: 19 mmol/L — ABNORMAL LOW (ref 22–32)
CO2: 21 mmol/L — ABNORMAL LOW (ref 22–32)
Calcium: 8.1 mg/dL — ABNORMAL LOW (ref 8.9–10.3)
Calcium: 8.1 mg/dL — ABNORMAL LOW (ref 8.9–10.3)
Calcium: 8.2 mg/dL — ABNORMAL LOW (ref 8.9–10.3)
Chloride: 98 mmol/L (ref 98–111)
Chloride: 99 mmol/L (ref 98–111)
Chloride: 100 mmol/L (ref 98–111)
Creatinine, Ser: 1.62 mg/dL — ABNORMAL HIGH (ref 0.44–1.00)
Creatinine, Ser: 1.89 mg/dL — ABNORMAL HIGH (ref 0.44–1.00)
Creatinine, Ser: 2.86 mg/dL — ABNORMAL HIGH (ref 0.44–1.00)
GFR, Estimated: 33 mL/min — ABNORMAL LOW (ref >60)
GFR, Estimated: 27 mL/min — ABNORMAL LOW (ref >60)
GFR, Estimated: 16 mL/min — ABNORMAL LOW (ref >60)
Glucose, Bld: 97 mg/dL (ref 70–99)
Glucose, Bld: 70 mg/dL (ref 70–99)
Glucose, Bld: 129 mg/dL — ABNORMAL HIGH (ref 70–99)
Potassium: 4 mmol/L (ref 3.5–5.1)
Potassium: 4.5 mmol/L (ref 3.5–5.1)
Potassium: 4.7 mmol/L (ref 3.5–5.1)
Sodium: 134 mmol/L — ABNORMAL LOW (ref 135–145)
Sodium: 134 mmol/L — ABNORMAL LOW (ref 135–145)
Sodium: 133 mmol/L — ABNORMAL LOW (ref 135–145)

## 2020-08-29 LAB — GLUCOSE, CAPILLARY
Glucose-Capillary: 101 mg/dL — ABNORMAL HIGH (ref 70–99)
Glucose-Capillary: 125 mg/dL — ABNORMAL HIGH (ref 70–99)
Glucose-Capillary: 132 mg/dL — ABNORMAL HIGH (ref 70–99)
Glucose-Capillary: 147 mg/dL — ABNORMAL HIGH (ref 70–99)

## 2020-08-29 LAB — CBC
HCT: 37.1 % (ref 36.0–46.0)
Hemoglobin: 12.6 g/dL (ref 12.0–15.0)
MCH: 29.9 pg (ref 26.0–34.0)
MCHC: 34 g/dL (ref 30.0–36.0)
MCV: 87.9 fL (ref 80.0–100.0)
Platelets: 371 10*3/uL (ref 150–400)
RBC: 4.22 MIL/uL (ref 3.87–5.11)
RDW: 13.1 % (ref 11.5–15.5)
WBC: 9 10*3/uL (ref 4.0–10.5)
nRBC: 0 % (ref 0.0–0.2)

## 2020-08-29 LAB — I-STAT VENOUS BLOOD GAS, ED
Acid-base deficit: 5 mmol/L — ABNORMAL HIGH (ref 0.0–2.0)
Bicarbonate: 22.3 mmol/L (ref 20.0–28.0)
Calcium, Ion: 0.95 mmol/L — ABNORMAL LOW (ref 1.15–1.40)
HCT: 43 % (ref 36.0–46.0)
Hemoglobin: 14.6 g/dL (ref 12.0–15.0)
O2 Saturation: 99 %
Potassium: 3.7 mmol/L (ref 3.5–5.1)
Sodium: 132 mmol/L — ABNORMAL LOW (ref 135–145)
TCO2: 24 mmol/L (ref 22–32)
pCO2, Ven: 49.8 mmHg (ref 44.0–60.0)
pH, Ven: 7.259 (ref 7.250–7.430)
pO2, Ven: 134 mmHg — ABNORMAL HIGH (ref 32.0–45.0)

## 2020-08-29 LAB — PHOSPHORUS
Phosphorus: 6 mg/dL — ABNORMAL HIGH (ref 2.5–4.6)
Phosphorus: 5.7 mg/dL — ABNORMAL HIGH (ref 2.5–4.6)
Phosphorus: 6.1 mg/dL — ABNORMAL HIGH (ref 2.5–4.6)
Phosphorus: 6.3 mg/dL — ABNORMAL HIGH (ref 2.5–4.6)

## 2020-08-29 LAB — BLOOD GAS, ARTERIAL
Acid-base deficit: 7.2 mmol/L — ABNORMAL HIGH (ref 0.0–2.0)
Bicarbonate: 18.8 mmol/L — ABNORMAL LOW (ref 20.0–28.0)
Drawn by: 29503
FIO2: 60
MECHVT: 400 mL
O2 Saturation: 97.4 %
PEEP: 10 cmH2O
Patient temperature: 98.6
RATE: 20 resp/min
pCO2 arterial: 41.9 mmHg (ref 32.0–48.0)
pH, Arterial: 7.274 — ABNORMAL LOW (ref 7.350–7.450)
pO2, Arterial: 124 mmHg — ABNORMAL HIGH (ref 83.0–108.0)

## 2020-08-29 LAB — MAGNESIUM
Magnesium: 2 mg/dL (ref 1.7–2.4)
Magnesium: 1.9 mg/dL (ref 1.7–2.4)
Magnesium: 2 mg/dL (ref 1.7–2.4)
Magnesium: 1.9 mg/dL (ref 1.7–2.4)

## 2020-08-29 LAB — LACTIC ACID, PLASMA
Lactic Acid, Venous: 3.2 mmol/L (ref 0.5–1.9)
Lactic Acid, Venous: 2.6 mmol/L (ref 0.5–1.9)

## 2020-08-29 LAB — CK: Total CK: 1004 U/L — ABNORMAL HIGH (ref 38–234)

## 2020-08-29 LAB — CBG MONITORING, ED
Glucose-Capillary: 116 mg/dL — ABNORMAL HIGH (ref 70–99)
Glucose-Capillary: 72 mg/dL (ref 70–99)
Glucose-Capillary: 87 mg/dL (ref 70–99)
Glucose-Capillary: 92 mg/dL (ref 70–99)

## 2020-08-29 LAB — MRSA PCR SCREENING: MRSA by PCR: NEGATIVE

## 2020-08-29 MED ORDER — FAMOTIDINE 40 MG/5ML PO SUSR
20.0000 mg | Freq: Every day | ORAL | Status: DC
  Administered 2020-08-29: 20 mg
  Filled 2020-08-29: qty 2.5

## 2020-08-29 MED ORDER — OSMOLITE 1.2 CAL PO LIQD
1000.0000 mL | ORAL | Status: DC
  Administered 2020-08-29: 1000 mL

## 2020-08-29 MED ORDER — POLYETHYLENE GLYCOL 3350 17 G PO PACK: 17.0000 g | PACK | Freq: Every day | ORAL | Status: DC | PRN

## 2020-08-29 MED ORDER — PROSOURCE TF PO LIQD
45.0000 mL | Freq: Two times a day (BID) | ORAL | Status: DC
  Administered 2020-08-29 (×2): 45 mL
  Filled 2020-08-29 (×2): qty 45

## 2020-08-29 MED ORDER — CHLORHEXIDINE GLUCONATE CLOTH 2 % EX PADS
6.0000 | MEDICATED_PAD | Freq: Every day | CUTANEOUS | Status: DC
  Administered 2020-08-29 – 2020-09-14 (×18): 6 via TOPICAL

## 2020-08-29 MED ORDER — CHLORHEXIDINE GLUCONATE 0.12% ORAL RINSE (MEDLINE KIT)
15.0000 mL | Freq: Two times a day (BID) | OROMUCOSAL | Status: DC
  Administered 2020-08-29 – 2020-09-05 (×13): 15 mL via OROMUCOSAL

## 2020-08-29 MED ORDER — ACETAMINOPHEN 160 MG/5ML PO SOLN
650.0000 mg | Freq: Four times a day (QID) | ORAL | Status: DC | PRN
  Administered 2020-08-30: 650 mg
  Filled 2020-08-29: qty 20.3

## 2020-08-29 MED ORDER — ORAL CARE MOUTH RINSE
15.0000 mL | OROMUCOSAL | Status: DC
  Administered 2020-08-29 – 2020-09-05 (×65): 15 mL via OROMUCOSAL

## 2020-08-29 MED ORDER — DEXTROSE IN LACTATED RINGERS 5 % IV SOLN
INTRAVENOUS | Status: DC
  Administered 2020-08-29: 100 mL/h via INTRAVENOUS

## 2020-08-29 MED ORDER — ACETAMINOPHEN 325 MG PO TABS
650.0000 mg | ORAL_TABLET | Freq: Four times a day (QID) | ORAL | Status: DC | PRN
  Administered 2020-08-29 (×3): 650 mg via ORAL
  Filled 2020-08-29 (×4): qty 2

## 2020-08-29 MED ORDER — FREE WATER
100.0000 mL | Status: DC
  Administered 2020-08-29 – 2020-08-30 (×5): 100 mL

## 2020-08-29 MED ORDER — LACTATED RINGERS IV SOLN: INTRAVENOUS | Status: AC

## 2020-08-29 MED ORDER — VITAL HIGH PROTEIN PO LIQD: 1000.0000 mL | ORAL | Status: DC

## 2020-08-29 MED ORDER — DOCUSATE SODIUM 50 MG/5ML PO LIQD
100.0000 mg | Freq: Two times a day (BID) | ORAL | Status: DC | PRN
  Filled 2020-08-29: qty 10

## 2020-08-29 NOTE — Progress Notes (Signed)
Initial Nutrition Assessment  DOCUMENTATION CODES:   Non-severe (moderate) malnutrition in context of chronic illness, Underweight  INTERVENTION:  - will confirm with RN that NGT tip is in an appropriate place. - will order TF regimen: Osmolite 1.2 @ 25 ml/hr to advance by 10 ml every 12 hours to reach goal rate of 45 ml/hr with 45 ml Prosource TF BID and 100 ml free water every 4 hours. - at goal rate, this regimen will provide 1376 kcal, 82 grams protein, and 1486 ml free water.   Monitor magnesium, potassium, and phosphorus daily for at least 3 days, MD to replete as needed, as pt is at risk for refeeding syndrome given report of poor appetite, moderate malnutrition.   NUTRITION DIAGNOSIS:   Moderate Malnutrition related to chronic illness (chronic hyponatremia associated with lightheadedness and vomiting) as evidenced by moderate fat depletion, moderate muscle depletion, severe muscle depletion.  GOAL:   Patient will meet greater than or equal to 90% of their needs  MONITOR:   Vent status, TF tolerance, Labs, Weight trends  REASON FOR ASSESSMENT:   Ventilator, Consult Enteral/tube feeding initiation and management  ASSESSMENT:   69 year-old female with medical history of large hiatal hernia, schatski ring, and hyponatremia. She was found down at home by her brother after she was last known normal 10/16. When EMS arrived, her O2 sats were in the 19s and she was having difficulty breathing. She was intubated in the ED on 10/17. CXR showed right sided atelectasis and likely infiltrate. In the ED, her brother reported that she has had frequent admissions for lightheadedness and confusion. 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 She has had a poor appetite and has episodes of dry heaving and vomiting, usually when she has low Na and low K. Brother reported significant weight loss over the past 1 year.  Patient transferred from [facility] ED to [facility] ICU today. She is intubated with  NGT in L nare; abd xray from 10/17 rport states tip was looped in the distal esophagus and should be replaced.   No family/visitors present at bedside. H&P note states that patient's brother reported poor appetite and that she has lost a significant (unknown amount) of weight in the past 1 year.  Weight yesterday was documented as 80 lb. Weight on 7/7 was 92 lb and weight on 3/24 was also 92 lb. This would indicate 12 lb weight loss (13% body weight) in the past 3 months; significant for time frame. No edema present at this time.     Patient is currently intubated on ventilator support MV: 7.8 L/min Temp (24hrs), Avg:100.5 F (38.1 C), Min:97.8 F (36.6 C), Max:102.2 F (39 C) Propofol: none BP: 102/73 and MAP: 81  Labs reviewed; CBGs: 92, 72, 87, 116, 147 mg/dl, Na: 134 mmol/l, BUN: 30 mg/dl, creatinine: 1.89 mg/dl, Ca: 8.1 mg/dl, Phos: 5.7 mg/dl, GFR: 27 ml/min. Medications reviewed; 20 mg pepcid/day, sliding scale novolog.  IVF; LR @ 75 ml/hr. Drip; fentanyl @ 175 mcg/hr.    NUTRITION - FOCUSED PHYSICAL EXAM:    Most Recent Value  Orbital Region Moderate depletion  Upper Arm Region Moderate depletion  Thoracic and Lumbar Region Unable to assess  Buccal Region Moderate depletion  Temple Region Moderate depletion  Clavicle Bone Region Moderate depletion  Clavicle and Acromion Bone Region Moderate depletion  Scapular Bone Region Moderate depletion  Dorsal Hand Moderate depletion  Patellar Region Severe depletion  Anterior Thigh Region Severe depletion  Posterior Calf Region Moderate depletion  Edema (  RD Assessment) None  Hair Reviewed  Eyes Unable to assess  Mouth Unable to assess  Skin Reviewed  Nails Reviewed       Diet Order:   Diet Order            Diet NPO time specified  Diet effective now                 EDUCATION NEEDS:   No education needs have been identified at this time  Skin:  Skin Assessment: Reviewed RN Assessment  Last BM:   PTA/unknown  Height:   Ht Readings from Last 1 Encounters:  08/28/20 5\' 2"  (1.575 m)    Weight:   Wt Readings from Last 1 Encounters:  08/28/20 36.3 kg     Estimated Nutritional Needs:  Kcal:  1365 kcal Protein:  >/= 73 grams (2 grams/kg) Fluid:  >/= 1.6 L/day     Jarome Matin, MS, RD, LDN, CNSC Inpatient Clinical Dietitian RD pager # available in [facility]  After hours/weekend pager # available in [facility]

## 2020-08-29 NOTE — Care Plan (Signed)
ELink RN notified of no UO since start of shift.

## 2020-08-29 NOTE — Progress Notes (Signed)
[facility] Progress Note Patient Name: [name] DOB: 02-11-1951 MRN: [id number]   Date of Service  08/29/2020  HPI/Events of Note  Ongoing oliguria since admit from AKI. Not on pressors. MAP is good. CK 1004.   eICU Interventions  - get BMP stat, call back if worsening AKI or hyperkalemia.  - bladder scan sos.  - continue care. Keep MAP > 65. - avoid nephrotoxic meds. - consider Nephrology help in AM. - EEG: encephalopathy, no sz.      Intervention Category Intermediate Interventions: Oliguria - evaluation and management  Elmer Sow 08/29/2020, 11:04 PM

## 2020-08-29 NOTE — Progress Notes (Signed)
Pt agitated at this time, RN request CPT to be held.

## 2020-08-29 NOTE — Progress Notes (Signed)
EEG completed, results pending. 

## 2020-08-29 NOTE — Progress Notes (Signed)
Patient transported to CT and back without complications.  

## 2020-08-29 NOTE — Progress Notes (Signed)
PHARMACY NOTE -  RENAL DOSE ADJUSTMENT   Pepcid 20 q12 decreased to qHS for CrCl ~ 17 ml/min  [name], Pharm.D 08/29/2020 11:28 AM

## 2020-08-29 NOTE — Progress Notes (Signed)
NAME:  [name], MRN:  [id number], DOB:  07-06-1951, LOS: 1 ADMISSION DATE:  08/28/2020, CONSULTATION DATE:  08/28/2020 REFERRING MD:  ED, CHIEF COMPLAINT:  Found down   Brief History   69 yo F who presented after being found down, hypoxemic the ED and intubated.  Admitting to ICU.   History of present illness   69 yo F with a history of hiatal hernia, schatski ring, hyponatremia who was found down at home by her brother after she was last known normal yesterday.  When EMS arrived, her O2 sats were in the 72s and had difficulty breathing.  She was thus placed on NRB, and when assessed in the ED, decision was made to intubate.   CXR showed right sided atelectasis and likely infiltrate.    I spoke to her brother and primary caregiver at bedside.  He says that she has had a rough year, with frequent admissions for lightheadedness and confusion and found to be hyponatremic and hypokalemic.  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  She has lost appetite, has episodes of dry heaving and vomiting- usually when she has low Na and low K.  Normally, she has a gradual clinical decline before admission for hyponatremia, however the events over the last 24 hours were very acute.  Patient lives alone, manages her own meds.  She has had substantial weight loss over the last 1 year.     Past Medical History  Hiatal hernia Hyponatremia Scatski ring Current smoker  Significant Hospital Events   Intubated in ED 10/17  Consults:    Procedures:  Intubation 10/17  Significant Diagnostic Tests:  CXR: 10/17: . Right mid to lower lung field density, likely atelectasis. Aspiration is not excluded. CT Head>> no acute process CT Chest>> LLL collapse, consolidation bilateral bases, probable aspiration   Micro Data:  Bcx>> Trach aspirate >>  Antimicrobials:  Ceftriaxone 10/17  >> Azithromycin 10/17 >>  Interim history/subjective:  On fentanyl gtt, awakens to voice but not following all commands  Objective   Blood pressure 107/74, pulse (!) 135, temperature (!) 101.9 F (38.8 C), resp. rate 20, height 5\' 2"  (1.575 m), weight 36.3 kg, SpO2 100 %.    Vent Mode: PRVC FiO2 (%):  [100 %] 100 % Set Rate:  [20 bmp] 20 bmp Vt Set:  [400 mL] 400 mL PEEP:  [10 cmH20-16 cmH20] 16 cmH20 Plateau Pressure:  [23 cmH20-29 cmH20] 29 cmH20   Intake/Output Summary (Last 24 hours) at 08/29/2020 0819 Last data filed at 08/29/2020 0731 Gross per 24 hour  Intake 3040 ml  Output 475 ml  Net 2565 ml   Filed Weights   08/28/20 2003  Weight: 36.3 kg    Examination: General: Adult female, cachectic, resting in bed, in NAD. Neuro: Sedated but opens eyes to voice, does not follow all commands. HEENT: [place]/AT. Sclerae anicteric. ETT in place. Cardiovascular: RRR, 3/6 SEM.  Lungs: Respirations even and unlabored.  CTA bilaterally, No W/R/R. Abdomen: BS x 4, soft, NT/ND.  Musculoskeletal: No gross deformities, no edema.  Skin: Intact, warm, no rashes.   Assessment & Plan:   69 yo F with a recent significant functional decline, episodes of hyponatremia (due to thiazides, hypovolemia, and ?SIADH), weight loss who presents after being found down and hypoxic, intubated in the ED for airway protection.   Acute hypoxic respiratory failure:  Likely aspirated based on CXR, could  have pneumonia- and will treat for CAP.   - continue ceftriaxone, azithromycin  - vent management, VAP bundle - daily SBT/SAT, wean sedation to assess mental status - RASS goal -1 to 0 - fentanyl for sedation  Acute encephalopathy: Normal sodium this time here. Unclear etiology- may be due to hypoxia and/or metabolic abnormalities.  CT head neg. - supportive care  AKI Hypokalemia - resolved Hyponatremia Mild rhabdo (CK 1600 on admission) - improving - supportive care - trend BMP, trend CK  Failure  to thrive: unintentional weight loss, admissions for metabolic abnormalities.  Could be due to her large hiatal hernia, and esophageal web.  CT head and chest neg for underlying cancer and /or paraneoplastic syndromes - start TF's, will need to watch for refeeding syndrome - needs ongoing goals of care discussions with family - can consider CT abd / pelv at some point   Best practice:  Diet: NPO Pain/Anxiety/Delirium protocol (if indicated): yes VAP protocol (if indicated): yes DVT prophylaxis: heparin subq GI prophylaxis: pepcid Glucose control: SSI q4 Mobility: Bedrest Code Status: Full Family Communication: Will call today Disposition: ICU   Critical care time: 30 min.    Montey Hora, [facility] 08/29/2020, 8:31 AM

## 2020-08-29 NOTE — Progress Notes (Signed)
CPT held at this time due to Pt agitation.  RT will attempt next scheduled time.

## 2020-08-29 NOTE — Procedures (Addendum)
Patient Name: [name]  MRN: [id number]  Epilepsy Attending: Lora Havens  Referring Physician/Provider: Dr Harvie Bridge Date: 08/30/2020 Duration: 24.34 mins  Patient history: 69yo F with ams, upward gaze preference. EEG to evaluate for seizure.   Level of alertness: lethargic  AEDs during EEG study: None  Technical aspects: This EEG study was done with scalp electrodes positioned according to the 10-20 International system of electrode placement. Electrical activity was acquired at a sampling rate of 500Hz  and reviewed with a high frequency filter of 70Hz  and a low frequency filter of 1Hz . EEG data were recorded continuously and digitally stored.   Description: EEG showed continuous generalized 3 to 6 Hz theta-delta slowing. Brief 2-3 seconds of generalized eeg attenuation were also noted. Hyperventilation and photic stimulation were not performed.     ABNORMALITY - Continuous slow, generalized - Background attenuation, generalized   IMPRESSION: This study is suggestive of severe diffuse encephalopathy, nonspecific etiology.  No seizures or epileptiform discharges were seen throughout the recording.  [name]

## 2020-08-29 NOTE — ED Notes (Signed)
Attempted report to inpatient unit at [facility]. Call back number provided.

## 2020-08-30 ENCOUNTER — Inpatient Hospital Stay (HOSPITAL_COMMUNITY): Payer: Medicare Other

## 2020-08-30 DIAGNOSIS — E871 Hypo-osmolality and hyponatremia: Secondary | ICD-10-CM | POA: Diagnosis not present

## 2020-08-30 DIAGNOSIS — J9620 Acute and chronic respiratory failure, unspecified whether with hypoxia or hypercapnia: Secondary | ICD-10-CM | POA: Diagnosis not present

## 2020-08-30 DIAGNOSIS — E43 Unspecified severe protein-calorie malnutrition: Secondary | ICD-10-CM

## 2020-08-30 DIAGNOSIS — N179 Acute kidney failure, unspecified: Secondary | ICD-10-CM

## 2020-08-30 DIAGNOSIS — J181 Lobar pneumonia, unspecified organism: Secondary | ICD-10-CM

## 2020-08-30 LAB — CBC
HCT: 32.6 % — ABNORMAL LOW (ref 36.0–46.0)
Hemoglobin: 11.2 g/dL — ABNORMAL LOW (ref 12.0–15.0)
MCH: 30.3 pg (ref 26.0–34.0)
MCHC: 34.4 g/dL (ref 30.0–36.0)
MCV: 88.1 fL (ref 80.0–100.0)
Platelets: 307 10*3/uL (ref 150–400)
RBC: 3.7 MIL/uL — ABNORMAL LOW (ref 3.87–5.11)
RDW: 13.2 % (ref 11.5–15.5)
WBC: 16.9 10*3/uL — ABNORMAL HIGH (ref 4.0–10.5)
nRBC: 0.1 % (ref 0.0–0.2)

## 2020-08-30 LAB — BASIC METABOLIC PANEL
Anion gap: 15 (ref 5–15)
BUN: 49 mg/dL — ABNORMAL HIGH (ref 8–23)
CO2: 19 mmol/L — ABNORMAL LOW (ref 22–32)
Calcium: 8.2 mg/dL — ABNORMAL LOW (ref 8.9–10.3)
Chloride: 99 mmol/L (ref 98–111)
Creatinine, Ser: 2.74 mg/dL — ABNORMAL HIGH (ref 0.44–1.00)
GFR, Estimated: 17 mL/min — ABNORMAL LOW (ref >60)
Glucose, Bld: 152 mg/dL — ABNORMAL HIGH (ref 70–99)
Potassium: 4.8 mmol/L (ref 3.5–5.1)
Sodium: 133 mmol/L — ABNORMAL LOW (ref 135–145)

## 2020-08-30 LAB — GLUCOSE, CAPILLARY
Glucose-Capillary: 114 mg/dL — ABNORMAL HIGH (ref 70–99)
Glucose-Capillary: 116 mg/dL — ABNORMAL HIGH (ref 70–99)
Glucose-Capillary: 121 mg/dL — ABNORMAL HIGH (ref 70–99)
Glucose-Capillary: 130 mg/dL — ABNORMAL HIGH (ref 70–99)
Glucose-Capillary: 136 mg/dL — ABNORMAL HIGH (ref 70–99)
Glucose-Capillary: 81 mg/dL (ref 70–99)
Glucose-Capillary: 88 mg/dL (ref 70–99)

## 2020-08-30 LAB — HEMOGLOBIN A1C
Hgb A1c MFr Bld: 5.2 % (ref 4.8–5.6)
Mean Plasma Glucose: 103 mg/dL

## 2020-08-30 LAB — PHOSPHORUS
Phosphorus: 4.8 mg/dL — ABNORMAL HIGH (ref 2.5–4.6)
Phosphorus: 4.6 mg/dL (ref 2.5–4.6)

## 2020-08-30 LAB — CK: Total CK: 759 U/L — ABNORMAL HIGH (ref 38–234)

## 2020-08-30 LAB — MAGNESIUM
Magnesium: 1.7 mg/dL (ref 1.7–2.4)
Magnesium: 1.9 mg/dL (ref 1.7–2.4)

## 2020-08-30 MED ORDER — METOPROLOL TARTRATE 5 MG/5ML IV SOLN
2.5000 mg | Freq: Four times a day (QID) | INTRAVENOUS | Status: DC
  Administered 2020-08-30 – 2020-09-01 (×7): 2.5 mg via INTRAVENOUS
  Filled 2020-08-30 (×6): qty 5

## 2020-08-30 MED ORDER — NICOTINE 21 MG/24HR TD PT24
21.0000 mg | MEDICATED_PATCH | Freq: Every day | TRANSDERMAL | Status: DC
  Administered 2020-08-30 – 2020-09-14 (×16): 21 mg via TRANSDERMAL
  Filled 2020-08-30 (×17): qty 1

## 2020-08-30 MED ORDER — LEVALBUTEROL HCL 0.63 MG/3ML IN NEBU
0.6300 mg | INHALATION_SOLUTION | RESPIRATORY_TRACT | Status: DC | PRN
  Administered 2020-09-05 – 2020-09-06 (×2): 0.63 mg via RESPIRATORY_TRACT
  Filled 2020-08-30 (×2): qty 3

## 2020-08-30 MED ORDER — BUDESONIDE 0.5 MG/2ML IN SUSP
0.5000 mg | Freq: Two times a day (BID) | RESPIRATORY_TRACT | Status: DC
  Administered 2020-08-30 – 2020-09-03 (×10): 0.5 mg via RESPIRATORY_TRACT
  Filled 2020-08-30 (×10): qty 2

## 2020-08-30 MED ORDER — MIDAZOLAM HCL 2 MG/2ML IJ SOLN
1.0000 mg | INTRAMUSCULAR | Status: DC | PRN
  Administered 2020-08-30 – 2020-09-03 (×15): 1 mg via INTRAVENOUS
  Filled 2020-08-30 (×15): qty 2

## 2020-08-30 MED ORDER — FAMOTIDINE IN NACL 20-0.9 MG/50ML-% IV SOLN
20.0000 mg | INTRAVENOUS | Status: DC
  Administered 2020-08-30 – 2020-09-02 (×4): 20 mg via INTRAVENOUS
  Filled 2020-08-30 (×4): qty 50

## 2020-08-30 MED ORDER — METOPROLOL TARTRATE 5 MG/5ML IV SOLN
2.5000 mg | INTRAVENOUS | Status: DC | PRN
  Administered 2020-08-31 (×4): 2.5 mg via INTRAVENOUS
  Administered 2020-09-01: 5 mg via INTRAVENOUS
  Administered 2020-09-01 – 2020-09-04 (×2): 2.5 mg via INTRAVENOUS
  Administered 2020-09-05 – 2020-09-07 (×2): 5 mg via INTRAVENOUS
  Filled 2020-08-30 (×8): qty 5

## 2020-08-30 NOTE — TOC Initial Note (Addendum)
Transition of Care [facility]) - Initial/Assessment Note    Patient Details  Name: [name] MRN: [id number] Date of Birth: July 18, 1951  Transition of [facility]) CM/SW Contact:    Leeroy Cha, RN Phone Number: 08/30/2020, 10:36 AM  Clinical Narrative:                 Urine culture positive for uti Temp-100.0 wbc-16.9 Iv abx x2, osolite, iv sedation,iv lr at 75cc/hr,  Ventilator at 40% Normally is able to manage opwn care at home.  Due to increased incapability may need snf placement once extubated. Expected Discharge Plan: Home/Self Care Barriers to Discharge: Barriers Unresolved (comment)   Patient Goals and CMS Choice Patient states their goals for this hospitalization and ongoing recovery are:: unable to state on vent      Expected Discharge Plan and Services Expected Discharge Plan: Home/Self Care   Discharge Planning Services: CM Consult   Living arrangements for the past 2 months: Apartment                                      Prior Living Arrangements/Services Living arrangements for the past 2 months: Apartment Lives with:: Self Patient language and need for interpreter reviewed:: Yes              Criminal Activity/Legal Involvement Pertinent to Current Situation/Hospitalization: No - Comment as needed  Activities of Daily Living Home Assistive Devices/Equipment: Eyeglasses ADL Screening (condition at time of admission) Patient's cognitive ability adequate to safely complete daily activities?: No Is the patient deaf or have difficulty hearing?: No Does the patient have difficulty seeing, even when wearing glasses/contacts?: No Does the patient have difficulty concentrating, remembering, or making decisions?: Yes Patient able to express need for assistance with ADLs?: No Does the patient have difficulty dressing or bathing?: Yes Independently performs ADLs?: No Communication: Dependent Is this a change from baseline?: Change from  baseline, expected to last >3 days Dressing (OT): Dependent Is this a change from baseline?: Change from baseline, expected to last >3 days Grooming: Dependent Is this a change from baseline?: Change from baseline, expected to last >3 days Feeding: Dependent Is this a change from baseline?: Change from baseline, expected to last >3 days Bathing: Dependent Is this a change from baseline?: Change from baseline, expected to last >3 days Toileting: Dependent Is this a change from baseline?: Change from baseline, expected to last >3days In/Out Bed: Dependent Is this a change from baseline?: Change from baseline, expected to last >3 days Walks in Home: Dependent Is this a change from baseline?: Change from baseline, expected to last >3 days Does the patient have difficulty walking or climbing stairs?: Yes (secondary to weakness) Weakness of Legs: Both Weakness of Arms/Hands: Both  Permission Sought/Granted                  Emotional Assessment Appearance:: Appears stated age Attitude/Demeanor/Rapport: Unable to Assess Affect (typically observed): Unable to Assess Orientation: : Fluctuating Orientation (Suspected and/or reported Sundowners) Alcohol / Substance Use: Not Applicable Psych Involvement: No (comment)  Admission diagnosis:  Respiratory failure ([place]) [J96.90] Somnolence [R40.0] Hyponatremia [E87.1] Acute respiratory failure with hypoxia ([place]) [J96.01] Community acquired pneumonia of right lower lobe of lung [J18.9] Acute and chr resp failure, unsp w hypoxia or hypercapnia ([place]) [J96.20] Patient Active Problem List   Diagnosis Date Noted  . Malnutrition of moderate degree 08/29/2020  . Respiratory failure ([place])   .  Community acquired pneumonia of right lower lobe of lung   . Somnolence   . Failure to thrive in adult   . Acute and chr resp failure, unsp w hypoxia or hypercapnia ([place]) 08/28/2020  . Generalized weakness 05/19/2020  . Hypokalemia 05/19/2020  .  Hyponatremia 02/03/2020  . Ambulatory dysfunction   . Prediabetes 03/23/2013  . Hyperlipidemia 03/23/2013  . Colon cancer screening 11/18/2012  . Hypertension 09/16/2012  . GERD (gastroesophageal reflux disease) 09/16/2012  . Tobacco use 09/16/2012   PCP:  Nicholes Rough, PA-C Pharmacy:   [place], [place] - [address] [place] [place] [address] Phone: [phone] Fax: [place], [place] [address] [place] [address] Phone: [phone] Fax: [phone]     Social Determinants of Health (SDOH) Interventions    Readmission Risk Interventions No flowsheet data found.

## 2020-08-30 NOTE — Progress Notes (Signed)
NAME:  [name], MRN:  [id number], DOB:  May 02, 1951, LOS: 2 ADMISSION DATE:  08/28/2020, CONSULTATION DATE:  08/28/2020 REFERRING MD:  ED, CHIEF COMPLAINT:  Found down   Brief History   69 yo F who presented after being found down, hypoxemic the ED and intubated.  Suspected aspiration PNA in setting of large hiatal hernia.   History of present illness   69 yo F with a history of hiatal hernia, schatski ring, hyponatremia who was found down at home by her brother after she was last known normal yesterday.  When EMS arrived, her O2 sats were in the 31s and had difficulty breathing.  She was thus placed on NRB, and when assessed in the ED, decision was made to intubate.   CXR showed right sided atelectasis and likely infiltrate.    Brother is her primary caregiver.  He says that she has had a rough year, with frequent admissions for lightheadedness and confusion and found to be hyponatremic and hypokalemic.  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  She has lost appetite, has episodes of dry heaving and vomiting- usually when she has low Na and low K.  Normally, she has a gradual clinical decline before admission for hyponatremia, however the events over the last 24 hours were very acute.  Patient lives alone, manages her own meds.  She has had substantial weight loss over the last 1 year.    Past Medical History  Hiatal hernia Hyponatremia Scatski ring Current smoker SIADH - HCTZ contribution, discontinued   Significant Hospital Events   ETT 10/17 >>  Consults:    Procedures:    Significant Diagnostic Tests:  CXR 10/17 >> Right mid to lower lung field density, likely atelectasis. Aspiration is not excluded. CT Head 10/17 >> no acute process CT Chest 10/17 >> LLL collapse, consolidation bilateral bases, probable aspiration   Micro Data:  COVID 10/17 >> negative    Influenza A/B 10/17 >> negative Tracheal aspirate 10/17 >>  MRSA PCR 10/18 >> negative  BCx2 10/17 >>    UC 10/17 >> 30k proteus >>   Antimicrobials:  Ceftriaxone 10/17 >> Azithromycin 10/17 >>  Interim history/subjective:  Tmax 99.9 / WBC 16.9 On vent - 40% / PEEP 10 Glucose range 121-152 I/O 645ml UOP, net even for 24 hours RN reports pt vomited, NGT removed. ST in 130-140's with stimulation but 80's at rest.   Objective   Blood pressure (!) 186/81, pulse (!) 105, temperature 99.9 F (37.7 C), resp. rate 20, height 5\' 2"  (1.575 m), weight 43.6 kg, SpO2 95 %.    Vent Mode: PRVC FiO2 (%):  [40 %-60 %] 40 % Set Rate:  [20 bmp] 20 bmp Vt Set:  [400 mL] 400 mL PEEP:  [10 cmH20] 10 cmH20 Plateau Pressure:  [18 cmH20-26 cmH20] 21 cmH20   Intake/Output Summary (Last 24 hours) at 08/30/2020 1055 Last data filed at 08/30/2020 0600 Gross per 24 hour  Intake 646.23 ml  Output 130 ml  Net 516.23 ml   Filed Weights   08/28/20 2003 08/30/20 0326  Weight: 36.3 kg 43.6 kg    Examination: General: cachectic frail adult female lying in bed in NAD  HEENT: MM pink/moist, ETT, upward gaze, did get patient to make brief eye contact Neuro: opens eyes to voice, upward gaze as above, sedate on fentanyl, no follow commands  CV: s1s2 RRR, ST on  monitor, no m/r/g PULM: non-labored on vent, diminished breath sounds bilaterally, faint wheezing on left GI: soft, bsx4 active  Extremities: warm/dry, no edema  Skin: thin, dry skin with multiple areas of ecchymosis   Assessment & Plan:   69 yo F with a recent significant functional decline, episodes of hyponatremia (due to thiazides, hypovolemia, and ?SIADH), weight loss who presents after being found down and hypoxic, intubated in the ED for airway protection.   Acute hypoxic respiratory failure Suspected Aspiration PNA Suspected aspirated based on CXR  -continue rocephin, azithromycin for possible CAP  -PRVC 8cc/kg, rate reduced 16 -wean  PEEP / fiO2 for sats 88-95% -daily SBT / WUA  -RASS goal 0 to -1  -Fentanyl gtt for sedation / pain -PRN versed   Acute Metabolic Encephalopathy Normal sodium. Unclear etiology- may be due to hypoxia and/or metabolic abnormalities.  CT head neg. -supportive care  -delirium prevention measures   AKI Hypokalemia - resolved Hyponatremia Mild rhabdo (CK 1600 on admission) - improving -Trend BMP / urinary output -Replace electrolytes as indicated -Avoid nephrotoxic agents, ensure adequate renal perfusion -follow CK, clearing   Hiatal Hernia Esophageal Web  -HOB elevated -aspiration precautions  Failure to thrive Severe Protein Calorie Malnutrition  Unintentional weight loss, admissions for metabolic abnormalities.  Could be due to her large hiatal hernia, and esophageal web.  CT head and chest neg for underlying cancer and /or paraneoplastic syndromes -attempt to place small bore feeding tube, may need fluoro guided placement  -consider CT ABD/Pelvis  -restart TF when able -? If she would be a candidate for any intervention for hernia given overall deconditioning    Best practice:  Diet: NPO Pain/Anxiety/Delirium protocol (if indicated): yes VAP protocol (if indicated): yes DVT prophylaxis: heparin subq GI prophylaxis: pepcid Glucose control: SSI q4 Mobility: Bedrest Code Status: Full Family Communication: Brother, [name], called for update.  Message left for return call.  Disposition: ICU   Critical care time: 34 minutes    Noe Gens, MSN, NP-C, AGACNP-BC [facility] 08/30/2020, 10:55 AM   Please see [email or web address] for pager details.

## 2020-08-30 NOTE — Progress Notes (Signed)
CPT held due to HR >140's.

## 2020-08-31 DIAGNOSIS — J189 Pneumonia, unspecified organism: Secondary | ICD-10-CM

## 2020-08-31 DIAGNOSIS — J9620 Acute and chronic respiratory failure, unspecified whether with hypoxia or hypercapnia: Secondary | ICD-10-CM | POA: Diagnosis not present

## 2020-08-31 DIAGNOSIS — E440 Moderate protein-calorie malnutrition: Secondary | ICD-10-CM | POA: Diagnosis not present

## 2020-08-31 DIAGNOSIS — E871 Hypo-osmolality and hyponatremia: Secondary | ICD-10-CM | POA: Diagnosis not present

## 2020-08-31 LAB — CBC
HCT: 31.7 % — ABNORMAL LOW (ref 36.0–46.0)
Hemoglobin: 10.6 g/dL — ABNORMAL LOW (ref 12.0–15.0)
MCH: 30 pg (ref 26.0–34.0)
MCHC: 33.4 g/dL (ref 30.0–36.0)
MCV: 89.8 fL (ref 80.0–100.0)
Platelets: 266 10*3/uL (ref 150–400)
RBC: 3.53 MIL/uL — ABNORMAL LOW (ref 3.87–5.11)
RDW: 13.6 % (ref 11.5–15.5)
WBC: 14.5 10*3/uL — ABNORMAL HIGH (ref 4.0–10.5)
nRBC: 0 % (ref 0.0–0.2)

## 2020-08-31 LAB — GLUCOSE, CAPILLARY
Glucose-Capillary: 172 mg/dL — ABNORMAL HIGH (ref 70–99)
Glucose-Capillary: 68 mg/dL — ABNORMAL LOW (ref 70–99)
Glucose-Capillary: 80 mg/dL (ref 70–99)
Glucose-Capillary: 86 mg/dL (ref 70–99)
Glucose-Capillary: 88 mg/dL (ref 70–99)
Glucose-Capillary: 88 mg/dL (ref 70–99)
Glucose-Capillary: 92 mg/dL (ref 70–99)

## 2020-08-31 LAB — URINE CULTURE: Culture: 30000 — AB

## 2020-08-31 LAB — BASIC METABOLIC PANEL
Anion gap: 13 (ref 5–15)
BUN: 43 mg/dL — ABNORMAL HIGH (ref 8–23)
CO2: 21 mmol/L — ABNORMAL LOW (ref 22–32)
Calcium: 8 mg/dL — ABNORMAL LOW (ref 8.9–10.3)
Chloride: 102 mmol/L (ref 98–111)
Creatinine, Ser: 1.79 mg/dL — ABNORMAL HIGH (ref 0.44–1.00)
GFR, Estimated: 29 mL/min — ABNORMAL LOW (ref >60)
Glucose, Bld: 95 mg/dL (ref 70–99)
Potassium: 3.8 mmol/L (ref 3.5–5.1)
Sodium: 136 mmol/L (ref 135–145)

## 2020-08-31 LAB — CK: Total CK: 539 U/L — ABNORMAL HIGH (ref 38–234)

## 2020-08-31 MED ORDER — CHLORHEXIDINE GLUCONATE 0.12 % MT SOLN
OROMUCOSAL | Status: AC
  Administered 2020-08-31: 15 mL via OROMUCOSAL
  Filled 2020-08-31: qty 15

## 2020-08-31 MED ORDER — DEXTROSE 50 % IV SOLN
INTRAVENOUS | Status: AC
  Administered 2020-08-31: 25 mL via INTRAVENOUS
  Filled 2020-08-31: qty 50

## 2020-08-31 MED ORDER — VITAL AF 1.2 CAL PO LIQD: 1000.0000 mL | ORAL | Status: DC

## 2020-08-31 MED ORDER — DEXTROSE 50 % IV SOLN: 25.0000 mL | Freq: Once | INTRAVENOUS | Status: AC

## 2020-08-31 MED ORDER — DEXMEDETOMIDINE HCL IN NACL 200 MCG/50ML IV SOLN
0.0000 ug/kg/h | INTRAVENOUS | Status: DC
  Administered 2020-08-31 – 2020-09-01 (×2): 0.4 ug/kg/h via INTRAVENOUS
  Administered 2020-09-01: 0.6 ug/kg/h via INTRAVENOUS
  Administered 2020-09-01: 0.3 ug/kg/h via INTRAVENOUS
  Administered 2020-09-02 (×2): 0.8 ug/kg/h via INTRAVENOUS
  Administered 2020-09-02: 0.2 ug/kg/h via INTRAVENOUS
  Administered 2020-09-03 (×3): 0.8 ug/kg/h via INTRAVENOUS
  Filled 2020-08-31 (×10): qty 50

## 2020-08-31 NOTE — Progress Notes (Signed)
NUTRITION NOTE  Discussed patient with RN yesterday AM and in rounds this AM. Plan for patient to go to IR for [facility] today.   Will change TF order to Vital 1.2 @ 20 ml/hr with 45 ml Prosource TF BID. This regimen will provide 656 kcal, 58 grams protein, and 389 ml free water.   Will not advance TF at this time. RD will continue to follow.      Jarome Matin, MS, RD, LDN, CNSC Inpatient Clinical Dietitian RD pager # available in [place]  After hours/weekend pager # available in [facility]

## 2020-08-31 NOTE — Progress Notes (Addendum)
NAME:  [name], MRN:  [id number], DOB:  1950-11-24, LOS: 3 ADMISSION DATE:  08/28/2020, CONSULTATION DATE:  08/28/2020 REFERRING MD:  ED, CHIEF COMPLAINT:  Found down   Brief History   69 yo F who presented after being found down, hypoxemic the ED and intubated in the setting of suspected aspiration PNA in setting of large hiatal hernia.   History of present illness   69 yo F with a history of hiatal hernia, schatski ring, hyponatremia who was found down at home by her brother after she was last known normal yesterday.  When EMS arrived, her O2 sats were in the 56s and had difficulty breathing.  She was thus placed on NRB, and when assessed in the ED, decision was made to intubate.   CXR showed right sided atelectasis and likely infiltrate.    Brother is her primary caregiver.  He says that she has had a rough year, with frequent admissions for lightheadedness and confusion and found to be hyponatremic and hypokalemic.  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  She has lost appetite, has episodes of dry heaving and vomiting- usually when she has low Na and low K.  Normally, she has a gradual clinical decline before admission for hyponatremia, however the events over the last 24 hours were very acute.  Patient lives alone, manages her own meds.  She has had substantial weight loss over the last 1 year.    Past Medical History  Hiatal hernia Hyponatremia Scatski ring Current smoker SIADH - HCTZ contribution, discontinued   Significant Hospital Events   ETT 10/17 >>  Consults:    Procedures:    Significant Diagnostic Tests:  CXR 10/17 >> Right mid to lower lung field density, likely atelectasis. Aspiration is not excluded. CT Head 10/17 >> no acute process CT Chest 10/17 >> LLL collapse, consolidation bilateral bases, probable aspiration   Micro Data:  COVID 10/17 >>  negative  Influenza A/B 10/17 >> negative Tracheal aspirate 10/17 >>  MRSA PCR 10/18 >> negative  BCx2 10/17 >>    UC 10/17 >> 30k proteus >> S-cefazolin, ceftriaxone. R-imipenem, nitrofurantoin  Antimicrobials:  Ceftriaxone 10/17 >> Azithromycin 10/17 >>  Interim history/subjective:  Tmax 99.9 / WBC down to 14.5  On vent - 30% fiO2, PEEP 5 Glucose range 88-95 I/O 1L UOP, +3.5L in last 24 hours   Objective   Blood pressure (!) 82/48, pulse 81, temperature 99.9 F (37.7 C), resp. rate 16, height 5\' 2"  (1.575 m), weight 45.5 kg, SpO2 100 %.    Vent Mode: PRVC FiO2 (%):  [30 %-40 %] 30 % Set Rate:  [16 bmp-20 bmp] 16 bmp Vt Set:  [400 mL] 400 mL PEEP:  [5 cmH20-10 cmH20] 5 cmH20 Plateau Pressure:  [15 cmH20-23 cmH20] 15 cmH20   Intake/Output Summary (Last 24 hours) at 08/31/2020 0924 Last data filed at 08/31/2020 0600 Gross per 24 hour  Intake 4559.4 ml  Output 1000 ml  Net 3559.4 ml   Filed Weights   08/28/20 2003 08/30/20 0326 08/31/20 0116  Weight: 36.3 kg 43.6 kg 45.5 kg    Examination: General: cachectic / frail adult female lying in bed on vent in NAD HEENT: MM pink/moist, ETT, small bore nasogastric feeding tube Neuro: sedate, opens eyes to voice then drifts back to sleep  CV: s1s2 rrr, no m/r/g PULM: non-labored on vent, lungs bilaterally coarse GI: soft,  bsx4 active  Extremities: warm/dry, no edema  Skin: thin dry skin with multiple areas of ecchymosis, mottling on LE's  Assessment & Plan:   69 yo F with a recent significant functional decline, episodes of hyponatremia (due to thiazides, hypovolemia, and ?SIADH), weight loss who presents after being found down and hypoxic, intubated in the ED for airway protection.   Acute hypoxic respiratory failure Suspected Aspiration PNA Suspected aspirated based on CXR  -continue rocephin / azithro for possible CAP, add stop date for 7 days total -PRVC 8cc/kg, rate 16 -wean PEEP / FiO2 for sats 88-95% -daily SBT  / WUA  -RASS Goal 0 to -1  -fentanyl gtt per PAD protocol with PRN versed   Acute Metabolic Encephalopathy Normal sodium. Unclear etiology- may be due to hypoxia and/or metabolic abnormalities.  CT head neg. -supportive care -delirium prevention measures   AKI Hypokalemia - resolved Hyponatremia Mild rhabdo (CK 1600 on admission) - improving -CK trend clearing, continue gentle fluids -Trend BMP / urinary output -Replace electrolytes as indicated -Avoid nephrotoxic agents, ensure adequate renal perfusion  Possible Proteus UTI vs Colonization  UC on admit obtained with 30k proteus, pt unable to state if she is having symptoms  -rocephin as above  -suspect this is colonization rather than acute inciting event for admit  Hiatal Hernia Esophageal Web  -HOB elevated  -aspiration precautions  -small bore gastric tube placed  Failure to thrive Severe Protein Calorie Malnutrition  Unintentional weight loss, admissions for metabolic abnormalities.  Could be due to her large hiatal hernia, and esophageal web.  CT head and chest neg for underlying cancer and /or paraneoplastic syndromes -small bore gastric tube placed > IR is going to attempt advancement, appreciate assistance   -resume TF at 70ml/hr once advanced    Best practice:  Diet: NPO Pain/Anxiety/Delirium protocol (if indicated): yes VAP protocol (if indicated): yes DVT prophylaxis: heparin SQ GI prophylaxis: pepcid Glucose control: SSI q4 Mobility: Bedrest Code Status: Full Family Communication: Will update Brother on arrival 10/20.  Disposition: ICU  Critical care time: 33 minutes    Noe Gens, MSN, NP-C, AGACNP-BC [facility] 08/31/2020, 9:24 AM   Please see [email or web address] for pager details.

## 2020-08-31 NOTE — Progress Notes (Signed)
08/31/2020 Notified providers during day shift of inability to obtain accurate BP; patient tensing with each BP measurement and reading appears artificially elevated. See correlation with manual reading at 1609 for example. Cindy S. Brigitte Pulse BSN, RN, CCRP 08/31/2020 4:51 PM

## 2020-09-01 ENCOUNTER — Inpatient Hospital Stay (HOSPITAL_COMMUNITY): Payer: Medicare Other

## 2020-09-01 DIAGNOSIS — K449 Diaphragmatic hernia without obstruction or gangrene: Secondary | ICD-10-CM | POA: Diagnosis not present

## 2020-09-01 DIAGNOSIS — J9620 Acute and chronic respiratory failure, unspecified whether with hypoxia or hypercapnia: Secondary | ICD-10-CM | POA: Diagnosis not present

## 2020-09-01 DIAGNOSIS — E871 Hypo-osmolality and hyponatremia: Secondary | ICD-10-CM | POA: Diagnosis not present

## 2020-09-01 LAB — CBC
HCT: 34.7 % — ABNORMAL LOW (ref 36.0–46.0)
Hemoglobin: 11.6 g/dL — ABNORMAL LOW (ref 12.0–15.0)
MCH: 30.3 pg (ref 26.0–34.0)
MCHC: 33.4 g/dL (ref 30.0–36.0)
MCV: 90.6 fL (ref 80.0–100.0)
Platelets: 226 10*3/uL (ref 150–400)
RBC: 3.83 MIL/uL — ABNORMAL LOW (ref 3.87–5.11)
RDW: 13.9 % (ref 11.5–15.5)
WBC: 15.1 10*3/uL — ABNORMAL HIGH (ref 4.0–10.5)
nRBC: 0 % (ref 0.0–0.2)

## 2020-09-01 LAB — BASIC METABOLIC PANEL
Anion gap: 14 (ref 5–15)
BUN: 31 mg/dL — ABNORMAL HIGH (ref 8–23)
CO2: 21 mmol/L — ABNORMAL LOW (ref 22–32)
Calcium: 8 mg/dL — ABNORMAL LOW (ref 8.9–10.3)
Chloride: 103 mmol/L (ref 98–111)
Creatinine, Ser: 1.09 mg/dL — ABNORMAL HIGH (ref 0.44–1.00)
GFR, Estimated: 52 mL/min — ABNORMAL LOW (ref >60)
Glucose, Bld: 96 mg/dL (ref 70–99)
Potassium: 3.9 mmol/L (ref 3.5–5.1)
Sodium: 138 mmol/L (ref 135–145)

## 2020-09-01 LAB — GLUCOSE, CAPILLARY
Glucose-Capillary: 82 mg/dL (ref 70–99)
Glucose-Capillary: 89 mg/dL (ref 70–99)
Glucose-Capillary: 83 mg/dL (ref 70–99)
Glucose-Capillary: 108 mg/dL — ABNORMAL HIGH (ref 70–99)
Glucose-Capillary: 108 mg/dL — ABNORMAL HIGH (ref 70–99)

## 2020-09-01 MED ORDER — METOPROLOL TARTRATE 5 MG/5ML IV SOLN
5.0000 mg | Freq: Four times a day (QID) | INTRAVENOUS | Status: DC
  Administered 2020-09-01 (×2): 5 mg via INTRAVENOUS
  Filled 2020-09-01 (×2): qty 5

## 2020-09-01 MED ORDER — STERILE WATER FOR INJECTION IJ SOLN
INTRAMUSCULAR | Status: AC
  Filled 2020-09-01: qty 20

## 2020-09-01 NOTE — Progress Notes (Signed)
Patient taken to diagnostics for panda placement. Dr Posey Pronto unsuccessful. Primary MD Dr Silas Flood informed. Tube feeding remains off.

## 2020-09-01 NOTE — Progress Notes (Addendum)
NAME:  [name], MRN:  [id number], DOB:  10-17-1951, LOS: 4 ADMISSION DATE:  08/28/2020, CONSULTATION DATE:  08/28/2020 REFERRING MD:  ED, CHIEF COMPLAINT:  Found down   Brief History   69 yo F who presented after being found down, hypoxemic the ED and intubated in the setting of suspected aspiration PNA in setting of large hiatal hernia.   History of present illness   69 yo F with a history of hiatal hernia, schatski ring, hyponatremia who was found down at home by her brother after she was last known normal yesterday.  When EMS arrived, her O2 sats were in the 56s and had difficulty breathing.  She was thus placed on NRB, and when assessed in the ED, decision was made to intubate.   CXR showed right sided atelectasis and likely infiltrate.    Brother is her primary caregiver.  He says that she has had a rough year, with frequent admissions for lightheadedness and confusion and found to be hyponatremic and hypokalemic.  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  She has lost appetite, has episodes of dry heaving and vomiting- usually when she has low Na and low K.  Normally, she has a gradual clinical decline before admission for hyponatremia, however the events over the last 24 hours were very acute.  Patient lives alone, manages her own meds.  She has had substantial weight loss over the last 1 year.    Past Medical History  Hiatal hernia Hyponatremia Scatski ring Current smoker SIADH - HCTZ contribution, discontinued   Significant Hospital Events   ETT 10/17 >>  Consults:    Procedures:    Significant Diagnostic Tests:  CXR 10/17 >> Right mid to lower lung field density, likely atelectasis. Aspiration is not excluded. CT Head 10/17 >> no acute process CT Chest 10/17 >> LLL collapse, consolidation bilateral bases, probable aspiration   Micro Data:  COVID 10/17 >>  negative  Influenza A/B 10/17 >> negative Tracheal aspirate 10/17 >>  MRSA PCR 10/18 >> negative  BCx2 10/17 >>    UC 10/17 >> 30k proteus >> S-cefazolin, ceftriaxone. R-imipenem, nitrofurantoin  Antimicrobials:  Ceftriaxone 10/17 >> Azithromycin 10/17 >>  Interim history/subjective:  Pt on precedex, fentanyl  RN reports no acute events overnight, pending IR for gastric tube manipulation  Afebrile /  WBC 15.1 Vent - 30%, PEEP 5  Glucose range 82-89 on D5w  I/O 1L UOP, 1.3L+ in last 24 hours   Objective   Blood pressure (!) 135/112, pulse (!) 131, temperature 98.8 F (37.1 C), resp. rate 20, height 5\' 2"  (1.575 m), weight 46.4 kg, SpO2 100 %.    Vent Mode: PRVC FiO2 (%):  [30 %] 30 % Set Rate:  [16 bmp] 16 bmp Vt Set:  [400 mL] 400 mL PEEP:  [5 cmH20-30 cmH20] 5 cmH20 Plateau Pressure:  [14 cmH20-18 cmH20] 18 cmH20   Intake/Output Summary (Last 24 hours) at 09/01/2020 0836 Last data filed at 09/01/2020 0538 Gross per 24 hour  Intake 2236.93 ml  Output 1050 ml  Net 1186.93 ml   Filed Weights   08/30/20 0326 08/31/20 0116 09/01/20 0220  Weight: 43.6 kg 45.5 kg 46.4 kg    Examination: General: cachectic adult female lying in bed on vent in NAD HEENT: MM pink/moist, ETT, pupils 23mm reactive, anicteric, mild scleral edema Neuro: sedate, opens eyes to voice, makes eye contact but  does not follow commands CV: s1s2 rrr, no m/r/g PULM: non-labored on vent, lungs bilaterally clear GI: soft, bsx4 active  Extremities: warm/dry, no edema  Skin: thin skin, multiple areas of ecchymosis  Assessment & Plan:   69 yo F with a recent significant functional decline, episodes of hyponatremia (due to thiazides, hypovolemia, and ?SIADH), weight loss who presents after being found down and hypoxic, intubated in the ED for airway protection.   Acute hypoxic respiratory failure Suspected Aspiration PNA Suspected aspirated based on CXR  -rocephin / azithro for aspiration coverage,  D5/7 -PRVC 8cc/kg, rate 16 -wean PEEP / fiO2 for sats 88-95% -daily SBT / WUA  -RASS Goal 0 to -1  -wean fentanyl per protocol, reduce ceiling to 200 mcg  -continue precedex   Acute Metabolic Encephalopathy Normal sodium. Unclear etiology- may be due to hypoxia and/or metabolic abnormalities.  CT head, EEG neg. -supportive care -delirium prevention measures   AKI Hypokalemia - resolved Hyponatremia Mild rhabdo (CK 1600 on admission) - improving -LR at 11ml/hr  -Trend BMP / urinary output -Replace electrolytes as indicated -Avoid nephrotoxic agents, ensure adequate renal perfusion  Possible Proteus UTI vs Colonization  UC on admit obtained with 30k proteus, pt unable to state if she is having symptoms  -rocephin as above  -suspect this is likely colonization   HTN -continue lopressor, increase to 5mg  IV Q6  Hiatal Hernia Esophageal Web  Vomiting  -elevate HOB -aspiration precautions  -small bore NGT, appreciate IR assistance in repositioning   Failure to thrive Severe Protein Calorie Malnutrition  Unintentional weight loss, admissions for metabolic abnormalities.  Could be due to her large hiatal hernia, and esophageal web.  CT head and chest neg for underlying cancer and /or paraneoplastic syndromes.  -feeding when able / access available  -she is not a candidate for intervention to repair the hernia given her degree of malnutrition & frailty  -resume TF when able   Best practice:  Diet: NPO Pain/Anxiety/Delirium protocol (if indicated): yes VAP protocol (if indicated): yes DVT prophylaxis: heparin SQ GI prophylaxis: pepcid Glucose control: SSI q4 Mobility: Bedrest Code Status: Full Family Communication: Brother, [name], updated on plan of care 10/21.  Reviewed findings of severe hiatal hernia, & subsequent malnutrition.  We discussed plan of care for [name] to include continuing to provide ventilator support, working toward successful extubation, attempting  nutritional support and ensuring she is not suffering. Our hope is to liberate her from mechanical ventilation and she can work toward a better nutritional status.  He is concerned about her returning home as he worries she is not able to care for herself.  He is in agreement to change her code status to DNR, no reintubation if extubated and continuing medical efforts for restoring health as long as appropriate but if she declines to focus on her comfort.   Disposition: ICU  Critical care time: 35 minutes    Noe Gens, MSN, NP-C, AGACNP-BC [facility] 09/01/2020, 8:36 AM   Please see [email or web address] for pager details.

## 2020-09-02 ENCOUNTER — Inpatient Hospital Stay: Payer: Self-pay

## 2020-09-02 DIAGNOSIS — K449 Diaphragmatic hernia without obstruction or gangrene: Secondary | ICD-10-CM | POA: Diagnosis not present

## 2020-09-02 DIAGNOSIS — E43 Unspecified severe protein-calorie malnutrition: Secondary | ICD-10-CM | POA: Diagnosis not present

## 2020-09-02 DIAGNOSIS — J189 Pneumonia, unspecified organism: Secondary | ICD-10-CM | POA: Diagnosis not present

## 2020-09-02 DIAGNOSIS — J9620 Acute and chronic respiratory failure, unspecified whether with hypoxia or hypercapnia: Secondary | ICD-10-CM | POA: Diagnosis not present

## 2020-09-02 LAB — GLUCOSE, CAPILLARY
Glucose-Capillary: 122 mg/dL — ABNORMAL HIGH (ref 70–99)
Glucose-Capillary: 78 mg/dL (ref 70–99)
Glucose-Capillary: 81 mg/dL (ref 70–99)
Glucose-Capillary: 83 mg/dL (ref 70–99)
Glucose-Capillary: 83 mg/dL (ref 70–99)
Glucose-Capillary: 97 mg/dL (ref 70–99)
Glucose-Capillary: 98 mg/dL (ref 70–99)

## 2020-09-02 LAB — CULTURE, BLOOD (ROUTINE X 2)
Culture: NO GROWTH
Culture: NO GROWTH

## 2020-09-02 LAB — CBC
HCT: 37.1 % (ref 36.0–46.0)
Hemoglobin: 12.1 g/dL (ref 12.0–15.0)
MCH: 29.7 pg (ref 26.0–34.0)
MCHC: 32.6 g/dL (ref 30.0–36.0)
MCV: 90.9 fL (ref 80.0–100.0)
Platelets: 229 10*3/uL (ref 150–400)
RBC: 4.08 MIL/uL (ref 3.87–5.11)
RDW: 14.1 % (ref 11.5–15.5)
WBC: 13.9 10*3/uL — ABNORMAL HIGH (ref 4.0–10.5)
nRBC: 0.1 % (ref 0.0–0.2)

## 2020-09-02 LAB — BASIC METABOLIC PANEL
Anion gap: 15 (ref 5–15)
BUN: 28 mg/dL — ABNORMAL HIGH (ref 8–23)
CO2: 21 mmol/L — ABNORMAL LOW (ref 22–32)
Calcium: 8.2 mg/dL — ABNORMAL LOW (ref 8.9–10.3)
Chloride: 106 mmol/L (ref 98–111)
Creatinine, Ser: 0.92 mg/dL (ref 0.44–1.00)
GFR, Estimated: >60 mL/min (ref >60)
Glucose, Bld: 114 mg/dL — ABNORMAL HIGH (ref 70–99)
Potassium: 3.7 mmol/L (ref 3.5–5.1)
Sodium: 142 mmol/L (ref 135–145)

## 2020-09-02 MED ORDER — SODIUM CHLORIDE 0.9% FLUSH
10.0000 mL | Freq: Two times a day (BID) | INTRAVENOUS | Status: DC
  Administered 2020-09-02 – 2020-09-06 (×8): 10 mL

## 2020-09-02 MED ORDER — SODIUM CHLORIDE 0.9% FLUSH
10.0000 mL | INTRAVENOUS | Status: DC | PRN
  Administered 2020-09-02 – 2020-09-05 (×2): 10 mL

## 2020-09-02 MED ORDER — LACTATED RINGERS IV SOLN: INTRAVENOUS | Status: AC

## 2020-09-02 MED ORDER — TRAVASOL 10 % IV SOLN
INTRAVENOUS | Status: AC
  Filled 2020-09-02: qty 480

## 2020-09-02 MED ORDER — FENTANYL CITRATE (PF) 100 MCG/2ML IJ SOLN
100.0000 ug | INTRAMUSCULAR | Status: DC | PRN
  Administered 2020-09-02 (×2): 100 ug via INTRAVENOUS
  Filled 2020-09-02 (×2): qty 2

## 2020-09-02 MED ORDER — METOPROLOL TARTRATE 5 MG/5ML IV SOLN
2.5000 mg | Freq: Four times a day (QID) | INTRAVENOUS | Status: DC
  Administered 2020-09-02 – 2020-09-05 (×9): 2.5 mg via INTRAVENOUS
  Filled 2020-09-02 (×10): qty 5

## 2020-09-02 MED ORDER — ACETAMINOPHEN 650 MG RE SUPP: 650.0000 mg | Freq: Four times a day (QID) | RECTAL | Status: DC | PRN

## 2020-09-02 NOTE — Plan of Care (Signed)
  Problem: Clinical Measurements: Goal: Respiratory complications will improve Outcome: Progressing   Problem: Coping: Goal: Level of anxiety will decrease Outcome: Progressing   Problem: Skin Integrity: Goal: Risk for impaired skin integrity will decrease Outcome: Progressing   Problem: Nutrition: Goal: Adequate nutrition will be maintained Outcome: Not Progressing Note: Unsuccessful feeding tube placement in IR

## 2020-09-02 NOTE — Progress Notes (Signed)
PHARMACY - TOTAL PARENTERAL NUTRITION CONSULT NOTE   Indication: intolerance to enteral feeding  Patient Measurements: Height: 5\' 2"  (157.5 cm) Weight: 46.6 kg (102 lb 11.8 oz) IBW/kg (Calculated) : 50.1 TPN AdjBW (KG): 36.3 Body mass index is 18.79 kg/m. Usual Weight:   Assessment: 69 yo F found down at home by brother. Pharmacy consulted 10/22 to start TPN for nutrition. She is currently intubated and NGT placement unsuccessful 2nd hiatal hernia.   PMH: large hiatal hernia, schatski ring, and hyponatremia, smoker, SIADH 2nd HCTZ  Glucose / Insulin: no hx DM , all CBGs <150 Electrolytes: WNL x Ca 8.2 Renal:   AKI resolved, SCr WNL, UOP improved LFTs / TGs:  LFTs slightly elevated on 10/17 admit, Prealbumin / albumin: alb 3.8 on admit Intake / Output; MIVF: LR @ 75 ml/hr GI Imaging: Surgeries / Procedures:   Central access: PICC ordered 10/22 for TPN TPN start date: 10/22  Nutritional Goals (per RD recommendation on 10/18): kCal: 1365, Protein: >=73, Fluid: >+ 1.6 L/day Goal TPN rate is 70 mL/hr (provides 84 g of protein and 1529 kcals per day)  Current Nutrition:  NPO  Plan:  Start TPN at 87mL/hr at 1800 Electrolytes in TPN: 71mEq/L of Na, 39mEq/L of K, 4mEq/L of Ca, 27mEq/L of Mg, and 85mmol/L of Phos. Cl:Ac ratio 1:1 Add standard MVI and trace elements to TPN Continue  Sensitive q4h SSI and adjust as needed  Reduce MIVF to 35 mL/hr at 1800 Monitor TPN labs on Mon/Thurs, full TPN labs in AM  Leodis Sias T 09/02/2020,11:31 AM

## 2020-09-02 NOTE — Progress Notes (Signed)
Brother, [name], updated via phone 10/22 on plan of care. He asks about IV nutrition and is hopeful that she can gain enough strength with a time limited trial of TPN to allow for extubation and they can have closure / make sure her affairs are in order. He understands that she may fail even with nutrition and that it is not a long term solution to her problem. Support offered.    Noe Gens, MSN, NP-C, AGACNP-BC [facility] 09/02/2020, 11:05 AM   Please see [email or web address] for pager details.

## 2020-09-02 NOTE — Progress Notes (Signed)
NAME:  [name], MRN:  [id number], DOB:  1951-05-25, LOS: 5 ADMISSION DATE:  08/28/2020, CONSULTATION DATE:  08/28/2020 REFERRING MD:  ED, CHIEF COMPLAINT:  Found down   Brief History   69 yo F who presented after being found down, hypoxemic the ED and intubated in the setting of suspected aspiration PNA in setting of large hiatal hernia.   History of present illness   69 yo F with a history of hiatal hernia, schatski ring, hyponatremia who was found down at home by her brother after she was last known normal yesterday.  When EMS arrived, her O2 sats were in the 53s and had difficulty breathing.  She was thus placed on NRB, and when assessed in the ED, decision was made to intubate.   CXR showed right sided atelectasis and likely infiltrate.    Brother is her primary caregiver.  He says that she has had a rough year, with frequent admissions for lightheadedness and confusion and found to be hyponatremic and hypokalemic.  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  She has lost appetite, has episodes of dry heaving and vomiting- usually when she has low Na and low K.  Normally, she has a gradual clinical decline before admission for hyponatremia, however the events over the last 24 hours were very acute.  Patient lives alone, manages her own meds.  She has had substantial weight loss over the last 1 year.    Past Medical History  Hiatal hernia Hyponatremia Scatski ring Current smoker SIADH - HCTZ contribution, discontinued   Significant Hospital Events   ETT 10/17 >>  Consults:    Procedures:    Significant Diagnostic Tests:  CXR 10/17 >> Right mid to lower lung field density, likely atelectasis. Aspiration is not excluded. CT Head 10/17 >> no acute process CT Chest 10/17 >> LLL collapse, consolidation bilateral bases, probable aspiration   Micro Data:  COVID 10/17 >>  negative  Influenza A/B 10/17 >> negative Tracheal aspirate 10/17 >>  MRSA PCR 10/18 >> negative  BCx2 10/17 >> negative  UC 10/17 >> 30k proteus >> S-cefazolin, ceftriaxone. R-imipenem, nitrofurantoin  Antimicrobials:  Ceftriaxone 10/17 >> Azithromycin 10/17 >>  Interim history/subjective:  Unable to place feeding tube per radiology  Afebrile  Remains on fentanyl / precedex  Glucose 81-114 I/O 647ml UOP, +1.5L in last 24 hours   Objective   Blood pressure (!) 162/126, pulse 63, temperature (!) 97.5 F (36.4 C), resp. rate 16, height 5\' 2"  (1.575 m), weight 46.6 kg, SpO2 100 %.    Vent Mode: PRVC FiO2 (%):  [30 %] 30 % Set Rate:  [16 bmp] 16 bmp Vt Set:  [400 mL] 400 mL PEEP:  [5 cmH20] 5 cmH20 Plateau Pressure:  [13 cmH20-17 cmH20] 16 cmH20   Intake/Output Summary (Last 24 hours) at 09/02/2020 0854 Last data filed at 09/02/2020 0400 Gross per 24 hour  Intake 2158.07 ml  Output 625 ml  Net 1533.07 ml   Filed Weights   08/31/20 0116 09/01/20 0220 09/02/20 0500  Weight: 45.5 kg 46.4 kg 46.6 kg    Examination: General: cachectic adult female lying in bed in NAD on vent  HEENT: MM pink/moist, ETT, pupils reactive Neuro: sedate, opens eyes CV: s1s2 RRR, no m/r/g PULM: non-labored on vent, lungs bilaterally clear  GI: soft, bsx4 active  Extremities: warm/dry, no edema  Skin: thin, dry skin, multiple areas  of bruising / ecchymosis   Assessment & Plan:   69 yo F with a recent significant functional decline, episodes of hyponatremia (due to thiazides, hypovolemia, and ?SIADH), weight loss who presents after being found down and hypoxic, intubated in the ED for airway protection.   Acute hypoxic respiratory failure Suspected Aspiration PNA Suspected aspirated based on CXR  -continue abx, D6/7  -PRVC 8cc/kg as rest mode  -daily SBT / WUA trial  -RASS Goal 0 to -1  -wean fentanyl per protocol -continue precedex  -follow intermittent CXR   Acute Metabolic  Encephalopathy Normal sodium. Unclear etiology- may be due to hypoxia and/or metabolic abnormalities.  CT head, EEG neg. -supportive care  -delirium prevention measures   AKI Hypokalemia - resolved Hyponatremia Mild rhabdo (CK 1600 on admission) - improving -LR at 78ml/hr  -Trend BMP / urinary output -Replace electrolytes as indicated -Avoid nephrotoxic agents, ensure adequate renal perfusion  Possible Proteus UTI vs Colonization  UC on admit obtained with 30k proteus, pt unable to state if she is having symptoms  -abx as above, will cover any urinary infection as well but suspect this is colonization   HTN -continue lopressor -follow BP trend  Hiatal Hernia Esophageal Web  Vomiting  -HOB elevated  -aspiration precautions  -unable to place feeding tube  -no options for PEG placement given severity of hernia   Failure to thrive Severe Protein Calorie Malnutrition  Unintentional weight loss, admissions for metabolic abnormalities.  Could be due to her large hiatal hernia, and esophageal web.  CT head and chest neg for underlying cancer and /or paraneoplastic syndromes.  -goal for extubation and allow diet as soon as possible  -not a candidate for intervention for repair at this point  -unable to provide TF  Best practice:  Diet: NPO Pain/Anxiety/Delirium protocol (if indicated): yes VAP protocol (if indicated): yes DVT prophylaxis: heparin SQ GI prophylaxis: pepcid Glucose control: SSI q4 Mobility: Bedrest Code Status: Full Family Communication: Brother, [name], updated on plan of care 10/21.  Reviewed findings of severe hiatal hernia, & subsequent malnutrition.  We discussed plan of care for [name] to include continuing to provide ventilator support, working toward successful extubation, attempting nutritional support and ensuring she is not suffering. Our hope is to liberate her from mechanical ventilation and she can work toward a better nutritional status.  He is  concerned about her returning home as he worries she is not able to care for herself.  He is in agreement to change her code status to DNR, no reintubation if extubated and continuing medical efforts for restoring health as long as appropriate but if she declines to focus on her comfort.   Disposition: ICU  Critical care time: 34 minutes    Noe Gens, MSN, NP-C, AGACNP-BC [facility] 09/02/2020, 8:54 AM   Please see [email or web address] for pager details.

## 2020-09-02 NOTE — Progress Notes (Signed)
Peripherally Inserted Central Catheter Placement  The IV Nurse has discussed with the patient and/or persons authorized to consent for the patient, the purpose of this procedure and the potential benefits and risks involved with this procedure.  The benefits include less needle sticks, lab draws from the catheter, and the patient may be discharged home with the catheter. Risks include, but not limited to, infection, bleeding, blood clot (thrombus formation), and puncture of an artery; nerve damage and irregular heartbeat and possibility to perform a PICC exchange if needed/ordered by physician.  Alternatives to this procedure were also discussed.  Bard Power PICC patient education guide, fact sheet on infection prevention and patient information card has been provided to patient /or left at bedside.    PICC Placement Documentation  PICC Double Lumen 09/02/20 PICC Right Brachial 31 cm 0 cm (Active)  Indication for Insertion or Continuance of Line Administration of hyperosmolar/irritating solutions (i.e. TPN, Vancomycin, etc.) 09/02/20 1317  Exposed Catheter (cm) 0 cm 09/02/20 1317  Dressing Change Due 09/09/20 09/02/20 1317     Consent obtained by bedside nurse, questions address and procedure reviewed by Jake Samples RN  Catalina Pizza 09/02/2020, 2:02 PM

## 2020-09-02 NOTE — Progress Notes (Signed)
Nutrition Follow-up  DOCUMENTATION CODES:   Non-severe (moderate) malnutrition in context of chronic illness, Underweight  INTERVENTION:  - TPN initiation and advancement per Pharmacist.  - Monitor magnesium, potassium, and phosphorus daily for at least 3 days, MD to replete as needed, as pt is at risk for refeeding syndrome given malnutrition, unknown PO intakes PTA, no nutrition since admission.  NUTRITION DIAGNOSIS:   Moderate Malnutrition related to chronic illness (chronic hyponatremia associated with lightheadedness and vomiting) as evidenced by moderate fat depletion, moderate muscle depletion, severe muscle depletion. -ongoing  GOAL:   Patient will meet greater than or equal to 90% of their needs -unmet/unable to meet at this time  MONITOR:   Vent status, Labs, Weight trends, Other (Comment) (TPN regimen)  REASON FOR ASSESSMENT:   Consult New TPN/TNA  ASSESSMENT:   69 year-old female with medical history of large hiatal hernia, schatski ring, and hyponatremia. She was found down at home by her brother after she was last known normal 10/16. When EMS arrived, her O2 sats were in the 80s and she was having difficulty breathing. She was intubated in the ED on 10/17. CXR showed right sided atelectasis and likely infiltrate. In the ED, her brother reported that she has had frequent admissions for lightheadedness and confusion. 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 She has had a poor appetite and has episodes of dry heaving and vomiting, usually when she has low Na and low K. Brother reported significant weight loss over the past 1 year.  Patient remains intubated. Inability to gain enteral access, no options for enteral access given large hiatal hernia. Patient discussed in rounds. CCM reports poor/grim prognosis.   Now planning TPN initiation for time limited trial. Pending PICC placement. Order in place for custom TPN @ 40 ml/hr to start today at 1800. Goal rate for  TPN is 70 ml/hr and will provide 1529 kcal, 84 grams protein.   Weight stable x3 days. Weight up compared to weight on 10/19 and 10/17.  Per notes: - toxic metabolic encephalopathy - acute hypoxemic respiratory failure d/t CAP vs aspiration - sepsis - AKI--resolved  - severe protein-calorie malnutrition    Patient is currently intubated on ventilator support MV: 6.5 L/min Temp (24hrs), Avg:99.5 F (37.5 C), Min:97.5 F (36.4 C), Max:101.1 F (38.4 C) Propofol: none BP: 169/109 and MAP: 125  Labs reviewed; CBGs: 97, 81, 78 mg/dl, BUN: 28 mg/dl, Ca: 8.2 mg/dl, Mg and Phos last drawn 10/19 and were WDL at that time.  Medications reviewed; 20 mg IV pepcid/day, sliding scale novolog.  IVF; LR @ 75 ml/hr to decrease to 35 ml/hr today at 1800 with TPN start.    Diet Order:   Diet Order            Diet NPO time specified  Diet effective now                 EDUCATION NEEDS:   No education needs have been identified at this time  Skin:  Skin Assessment: Reviewed RN Assessment  Last BM:  PTA/unknown  Height:   Ht Readings from Last 1 Encounters:  08/28/20 5\' 2"  (1.575 m)    Weight:   Wt Readings from Last 1 Encounters:  09/02/20 46.6 kg     Estimated Nutritional Needs:  Kcal:  1323 kcal Protein:  >/= 73 grams (2 grams/kg) Fluid:  >/= 1.6 L/day     Jarome Matin, MS, RD, LDN, CNSC Inpatient Clinical Dietitian RD pager # available in [facility]  After hours/weekend pager # available in [facility]

## 2020-09-03 ENCOUNTER — Inpatient Hospital Stay (HOSPITAL_COMMUNITY): Payer: Medicare Other

## 2020-09-03 DIAGNOSIS — J9620 Acute and chronic respiratory failure, unspecified whether with hypoxia or hypercapnia: Secondary | ICD-10-CM | POA: Diagnosis not present

## 2020-09-03 DIAGNOSIS — E440 Moderate protein-calorie malnutrition: Secondary | ICD-10-CM | POA: Diagnosis not present

## 2020-09-03 LAB — COMPREHENSIVE METABOLIC PANEL
ALT: 29 U/L (ref 0–44)
AST: 34 U/L (ref 15–41)
Albumin: 1.9 g/dL — ABNORMAL LOW (ref 3.5–5.0)
Alkaline Phosphatase: 90 U/L (ref 38–126)
Anion gap: 10 (ref 5–15)
BUN: 26 mg/dL — ABNORMAL HIGH (ref 8–23)
CO2: 27 mmol/L (ref 22–32)
Calcium: 8 mg/dL — ABNORMAL LOW (ref 8.9–10.3)
Chloride: 105 mmol/L (ref 98–111)
Creatinine, Ser: 0.69 mg/dL (ref 0.44–1.00)
GFR, Estimated: >60 mL/min (ref >60)
Glucose, Bld: 147 mg/dL — ABNORMAL HIGH (ref 70–99)
Potassium: 2.8 mmol/L — ABNORMAL LOW (ref 3.5–5.1)
Sodium: 142 mmol/L (ref 135–145)
Total Bilirubin: 0.4 mg/dL (ref 0.3–1.2)
Total Protein: 3.9 g/dL — ABNORMAL LOW (ref 6.5–8.1)

## 2020-09-03 LAB — TRIGLYCERIDES: Triglycerides: 110 mg/dL (ref <150)

## 2020-09-03 LAB — DIFFERENTIAL
Abs Immature Granulocytes: 0.17 10*3/uL — ABNORMAL HIGH (ref 0.00–0.07)
Basophils Absolute: 0 10*3/uL (ref 0.0–0.1)
Basophils Relative: 0 %
Eosinophils Absolute: 0 10*3/uL (ref 0.0–0.5)
Eosinophils Relative: 0 %
Immature Granulocytes: 2 %
Lymphocytes Relative: 10 %
Lymphs Abs: 0.9 10*3/uL (ref 0.7–4.0)
Monocytes Absolute: 0.9 10*3/uL (ref 0.1–1.0)
Monocytes Relative: 11 %
Neutro Abs: 6.4 10*3/uL (ref 1.7–7.7)
Neutrophils Relative %: 77 %

## 2020-09-03 LAB — CBC
HCT: 27.7 % — ABNORMAL LOW (ref 36.0–46.0)
Hemoglobin: 9.1 g/dL — ABNORMAL LOW (ref 12.0–15.0)
MCH: 29.6 pg (ref 26.0–34.0)
MCHC: 32.9 g/dL (ref 30.0–36.0)
MCV: 90.2 fL (ref 80.0–100.0)
Platelets: 186 10*3/uL (ref 150–400)
RBC: 3.07 MIL/uL — ABNORMAL LOW (ref 3.87–5.11)
RDW: 13.9 % (ref 11.5–15.5)
WBC: 8.4 10*3/uL (ref 4.0–10.5)
nRBC: 0 % (ref 0.0–0.2)

## 2020-09-03 LAB — GLUCOSE, CAPILLARY
Glucose-Capillary: 125 mg/dL — ABNORMAL HIGH (ref 70–99)
Glucose-Capillary: 149 mg/dL — ABNORMAL HIGH (ref 70–99)
Glucose-Capillary: 142 mg/dL — ABNORMAL HIGH (ref 70–99)
Glucose-Capillary: 177 mg/dL — ABNORMAL HIGH (ref 70–99)
Glucose-Capillary: 129 mg/dL — ABNORMAL HIGH (ref 70–99)

## 2020-09-03 LAB — PREALBUMIN: Prealbumin: 5.5 mg/dL — ABNORMAL LOW (ref 18–38)

## 2020-09-03 LAB — MAGNESIUM: Magnesium: 1.5 mg/dL — ABNORMAL LOW (ref 1.7–2.4)

## 2020-09-03 LAB — PHOSPHORUS: Phosphorus: 1.7 mg/dL — ABNORMAL LOW (ref 2.5–4.6)

## 2020-09-03 MED ORDER — LIP MEDEX EX OINT
TOPICAL_OINTMENT | CUTANEOUS | Status: AC
  Filled 2020-09-03: qty 7

## 2020-09-03 MED ORDER — POTASSIUM PHOSPHATES 15 MMOLE/5ML IV SOLN
30.0000 mmol | Freq: Once | INTRAVENOUS | Status: AC
  Administered 2020-09-03: 30 mmol via INTRAVENOUS
  Filled 2020-09-03: qty 10

## 2020-09-03 MED ORDER — TRAVASOL 10 % IV SOLN
INTRAVENOUS | Status: AC
  Filled 2020-09-03: qty 480

## 2020-09-03 MED ORDER — MAGNESIUM SULFATE 4 GM/100ML IV SOLN
4.0000 g | Freq: Once | INTRAVENOUS | Status: AC
  Administered 2020-09-03: 4 g via INTRAVENOUS
  Filled 2020-09-03: qty 100

## 2020-09-03 MED ORDER — POTASSIUM CHLORIDE 10 MEQ/50ML IV SOLN
10.0000 meq | INTRAVENOUS | Status: AC
  Administered 2020-09-03 (×4): 10 meq via INTRAVENOUS
  Filled 2020-09-03 (×4): qty 50

## 2020-09-03 NOTE — Procedures (Signed)
Extubation Procedure Note  Patient Details:   Name: Marvetta Vohs [facility] DOB: 06-21-1951 MRN: [id number]   Airway Documentation:    Vent end date: 09/03/20 Vent end time: 1415   Evaluation  O2 sats: stable throughout Complications: No apparent complications Patient did tolerate procedure well. Bilateral Breath Sounds: Diminished, Clear   Yes   RT extubated patient per MD order to 4L [place] with RN at bedside. Positive cuff leak noted. No stridor noted at this time. RT will continue to monitor as needed.   Fabiola Backer 09/03/2020, 4:25 PM

## 2020-09-03 NOTE — Progress Notes (Signed)
PHARMACY - TOTAL PARENTERAL NUTRITION CONSULT NOTE   Indication: intolerance to enteral feeding  Patient Measurements: Height: 5\' 2"  (157.5 cm) Weight: 46.1 kg (101 lb 10.1 oz) IBW/kg (Calculated) : 50.1 TPN AdjBW (KG): 36.3 Body mass index is 18.59 kg/m. Usual Weight:   Assessment: 69 yo F found down at home by brother. Pharmacy consulted 10/22 to start TPN for nutrition. She is currently intubated and NGT placement unsuccessful 2nd hiatal hernia.   PMH: large hiatal hernia, schatski ring, and hyponatremia, smoker, SIADH 2nd HCTZ  Glucose / Insulin: no hx DM , all CBGs <150, 2 U SSI/24 hrs Electrolytes: severe refeeding as anticipated K 2.8, Phos 1.7, Mg 1.5, CoCa 9.68 Renal:   AKI resolved, SCr WNL, I/O +1496 LFTs / TGs:  LFTs slightly elevated on 10/17 admit, now WNL; Trig 110 Prealbumin / albumin:  Prealbumin 5.5; alb 3.8 on admit, now 1.9; Intake / Output; MIVF: LR @ 35 ml/hr GI Imaging: Surgeries / Procedures:   Central access: PICC placed 10/22 for TPN TPN start date: 10/22  Nutritional Goals (per RD recommendation on 10/18): kCal: 1365, Protein: >=73, Fluid: >+ 1.6 L/day Goal TPN rate is 70 mL/hr (provides 84 g of protein and 1529 kcals per day)  Current Nutrition:  NPO  TPN @ 40 ml/hr  Plan:  Now: mag 4 gm, K phos 30 mMol (17meq K), 3 runs K  continue TPN at 70mL/hr at 1800- will not advance today due to severe refeeding Electrolytes in TPN: 64mEq/L of Na, 68mEq/L of K, 83mEq/L of Ca, 48mEq/L of Mg, and 47mmol/L of Phos. Cl:Ac ratio 1:1 Add standard MVI and trace elements to TPN Add pepcid 20 mg to TPN and DC IV bolus order Continue  Sensitive q4h SSI and adjust as needed  continue MIVF LR @ 35 mL/hr  Monitor TPN labs on Mon/Thurs, BMET, Mg, Phos in AM  [name], Pharm.D 09/03/2020 8:21 AM

## 2020-09-03 NOTE — Progress Notes (Signed)
NAME:  [name], MRN:  [id number], DOB:  14-Oct-1951, LOS: 6 ADMISSION DATE:  08/28/2020, CONSULTATION DATE:  08/28/2020 REFERRING MD:  ED, CHIEF COMPLAINT:  Found down   Brief History   69 yo F who presented after being found down, hypoxemic the ED and intubated in the setting of suspected aspiration PNA in setting of large hiatal hernia.   History of present illness   69 yo F with a history of hiatal hernia, schatski ring, hyponatremia who was found down at home by her brother after she was last known normal one day PTA.   When EMS arrived, her O2 sats were in the 80s and had difficulty breathing.  She was thus placed on NRB, and when assessed in the ED, decision was made to intubate.   CXR showed right sided atelectasis and likely infiltrate.    Brother is her primary caregiver.  He says that she has had a rough year, with frequent admissions for lightheadedness and confusion and found to be hyponatremic and hypokalemic.  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  She has lost appetite, has episodes of dry heaving and vomiting- usually when she has low Na and low K.  Normally, she has a gradual clinical decline before admission for hyponatremia, however the events over the last 24 hours PTA were very acute.  Patient lives alone, manages her own meds.  She has had substantial weight loss over the last 1 year.    Past Medical History  Hiatal hernia Hyponatremia Scatski ring Current smoker SIADH - HCTZ contribution, discontinued   Significant Hospital Events   TPN started 10/22   Consults:    Procedures:  Oral Et   10/17 >>> R [facility]    10/23 >>>  Significant Diagnostic Tests:  CXR 10/17 >> Right mid to lower lung field density, likely atelectasis. Aspiration is not excluded. CT Head 10/17 >> no acute process CT Chest 10/17 >> LLL collapse, consolidation bilateral bases,  probable aspiration   Micro Data:  COVID 10/17 >> negative  Influenza A/B 10/17 >> negative MRSA PCR 10/18 >> negative  BCx2 10/17 >> negative  UC 10/17 >> 30k proteus >> S-cefazolin, ceftriaxone. R-imipenem, nitrofurantoin  Antimicrobials:  Ceftriaxone 10/17 >> Azithromycin 10/17 >>   Scheduled Meds: . budesonide (PULMICORT) nebulizer solution  0.5 mg Nebulization BID  . chlorhexidine gluconate (MEDLINE KIT)  15 mL Mouth Rinse BID  . Chlorhexidine Gluconate Cloth  6 each Topical Daily  . heparin  5,000 Units Subcutaneous Q8H  . insulin aspart  0-9 Units Subcutaneous Q4H  . mouth rinse  15 mL Mouth Rinse 10 times per day  . metoprolol tartrate  2.5 mg Intravenous Q6H  . nicotine  21 mg Transdermal Daily  . sodium chloride flush  10-40 mL Intracatheter Q12H   Continuous Infusions: . azithromycin Stopped (09/02/20 2055)  . cefTRIAXone (ROCEPHIN)  IV Stopped (09/02/20 2132)  . dexmedetomidine (PRECEDEX) IV infusion 0.4 mcg/kg/hr (09/03/20 0818)  . lactated ringers 35 mL/hr at 09/03/20 0800  . magnesium sulfate bolus IVPB 4 g (09/03/20 0806)  . potassium chloride 10 mEq (09/03/20 0823)  . potassium PHOSPHATE IVPB (in mmol)    . TPN ADULT (ION) 40 mL/hr at 09/03/20 0800  . TPN ADULT (ION)     PRN Meds:.acetaminophen, docusate, fentaNYL (SUBLIMAZE) injection, levalbuterol, metoprolol tartrate, midazolam, polyethylene glycol, sodium chloride flush  Interim history/subjective:  Opening eyes  on prececedex/ reaching for ET but not breathing over vent or triggering PSV  Objective   Blood pressure 128/76, pulse 60, temperature (!) 97.2 F (36.2 C), resp. rate 16, height _0  (1.575 m), weight 46.1 kg, SpO2 100 %.    Vent Mode: PRVC FiO2 (%):  [30 %] 30 % Set Rate:  [16 bmp] 16 bmp Vt Set:  [400 mL] 400 mL PEEP:  [5 cmH20] 5 cmH20 Plateau Pressure:  [13 cmH20-16 cmH20] 15 cmH20   Intake/Output Summary (Last 24 hours) at 09/03/2020 0823 Last data filed at 09/03/2020 0700 Gross  per 24 hour  Intake 2565.47 ml  Output 575 ml  Net 1990.47 ml   Filed Weights   09/02/20 0500 09/02/20 1157 09/03/20 0500  Weight: 46.6 kg 46.4 kg 46.1 kg    Examination: Tmax 100.8 Pt opening eyes but not tracking No jvd Oropharynx et Neck supple Lungs with min rhonchi bilaterally RRR no s3 or or sign murmur Abd soft nol excursion  Extr warm with no edema / muscle wasting noted    I personally reviewed images and agree with radiology impression as follows:  pCXR:  10/23 1. Endotracheal tube is stable in position with tip above the level of the carina. 2. New RIGHT-sided PICC line appears appropriately positioned. 3. Veiled opacities at each lung base, likely bilateral atelectasis and/or small pleural effusions.         Assessment & Plan:   68 yo F with a recent significant functional decline, episodes of hyponatremia (due to thiazides, hypovolemia, and ?SIADH), weight loss who presented after being found down and hypoxic, intubated in the ED for airway protection.   Acute hypoxic respiratory failure Suspected Aspiration PNA vs CAP Suspected aspirationg based on CXR  -continue abx as per flowsheet above   Acute Metabolic Encephalopathy Unclear etiology- may be due to hypoxia and/or metabolic abnormalities.  CT head, EEG neg. -supportive care  -delirium prevention measures  -weaning off precedex / hope to extubate today   AKI resolved  Hypokalemia  Mild rhabdo (CK 1600 on admission) - improving Lab Results  Component Value Date   CREATININE 0.69 09/03/2020   CREATININE 0.92 09/02/2020   CREATININE 1.09 (H) 09/01/2020   -Replace electrolytes as indicated -Avoid nephrotoxic agents, ensure adequate renal perfusion  Possible Proteus UTI vs Colonization  UC on admit obtained with 30k proteus, pt unable to state if she is having symptoms  -abx as above  HTN -continue lopressor -follow BP trend  Hiatal Hernia Esophageal Web  Vomiting  -HOB elevated    -aspiration precautions    Failure to thrive Severe Protein Calorie Malnutrition  Unintentional weight loss, admissions for metabolic abnormalities.  Could be due to her large hiatal hernia, and esophageal web.  CT head and chest neg for underlying cancer and /or paraneoplastic syndromes.  -not a candidate for intervention for repair at this point  -unable to provide TF> tpn started 10/22    Best practice:  Diet: NPO Pain/Anxiety/Delirium protocol (if indicated): yes VAP protocol (if indicated): yes DVT prophylaxis: heparin SQ GI prophylaxis: pepcid in TPN Glucose control: SSI q4 Mobility: Bedrest Code Status: Full Family Communication: Brother, [name], updated on plan of care 10/21.  Reviewed findings of severe hiatal hernia, & subsequent malnutrition.  We discussed plan of care for Mrs. Tardif to include continuing to provide ventilator support, working toward successful extubation, attempting nutritional support and ensuring she is not suffering. Our hope is to liberate her from mechanical ventilation and she can work  toward a better nutritional status.  He is concerned about her returning home as he worries she is not able to care for herself.  He is in agreement to change her code status to DNR, no reintubation if extubated and continuing medical efforts for restoring health as long as appropriate but if she declines to focus on her comfort.   Disposition: ICU   Plan for today is wall tbar and extubate, then encourage PO intake as tol and continue TPN in meantime.   The patient is critically ill with multiple organ systems failure and requires high complexity decision making for assessment and support, frequent evaluation and titration of therapies, application of advanced monitoring technologies and extensive interpretation of multiple databases. Critical Care Time devoted to patient care services described in this note is 35 minutes.    Christinia Gully, MD Pulmonary and [place] [phone]   After 7:00 pm call Elink  [phone]

## 2020-09-04 ENCOUNTER — Inpatient Hospital Stay (HOSPITAL_COMMUNITY): Payer: Medicare Other

## 2020-09-04 DIAGNOSIS — J9620 Acute and chronic respiratory failure, unspecified whether with hypoxia or hypercapnia: Secondary | ICD-10-CM | POA: Diagnosis not present

## 2020-09-04 DIAGNOSIS — E440 Moderate protein-calorie malnutrition: Secondary | ICD-10-CM | POA: Diagnosis not present

## 2020-09-04 LAB — GLUCOSE, CAPILLARY
Glucose-Capillary: 100 mg/dL — ABNORMAL HIGH (ref 70–99)
Glucose-Capillary: 111 mg/dL — ABNORMAL HIGH (ref 70–99)
Glucose-Capillary: 117 mg/dL — ABNORMAL HIGH (ref 70–99)
Glucose-Capillary: 124 mg/dL — ABNORMAL HIGH (ref 70–99)
Glucose-Capillary: 126 mg/dL — ABNORMAL HIGH (ref 70–99)
Glucose-Capillary: 142 mg/dL — ABNORMAL HIGH (ref 70–99)

## 2020-09-04 LAB — PHOSPHORUS: Phosphorus: 3.4 mg/dL (ref 2.5–4.6)

## 2020-09-04 LAB — BASIC METABOLIC PANEL
Anion gap: 8 (ref 5–15)
BUN: 22 mg/dL (ref 8–23)
CO2: 28 mmol/L (ref 22–32)
Calcium: 7.6 mg/dL — ABNORMAL LOW (ref 8.9–10.3)
Chloride: 107 mmol/L (ref 98–111)
Creatinine, Ser: 0.56 mg/dL (ref 0.44–1.00)
GFR, Estimated: >60 mL/min (ref >60)
Glucose, Bld: 127 mg/dL — ABNORMAL HIGH (ref 70–99)
Potassium: 3.7 mmol/L (ref 3.5–5.1)
Sodium: 143 mmol/L (ref 135–145)

## 2020-09-04 LAB — MAGNESIUM: Magnesium: 2.2 mg/dL (ref 1.7–2.4)

## 2020-09-04 MED ORDER — ARFORMOTEROL TARTRATE 15 MCG/2ML IN NEBU
15.0000 ug | INHALATION_SOLUTION | Freq: Two times a day (BID) | RESPIRATORY_TRACT | Status: DC
  Administered 2020-09-04 – 2020-09-12 (×15): 15 ug via RESPIRATORY_TRACT
  Filled 2020-09-04 (×18): qty 2

## 2020-09-04 MED ORDER — BUDESONIDE 0.25 MG/2ML IN SUSP
0.2500 mg | Freq: Two times a day (BID) | RESPIRATORY_TRACT | Status: DC
  Administered 2020-09-04 – 2020-09-12 (×17): 0.25 mg via RESPIRATORY_TRACT
  Filled 2020-09-04 (×15): qty 2

## 2020-09-04 MED ORDER — TRAVASOL 10 % IV SOLN
INTRAVENOUS | Status: AC
  Filled 2020-09-04: qty 739.2

## 2020-09-04 MED ORDER — INSULIN ASPART 100 UNIT/ML ~~LOC~~ SOLN
0.0000 [IU] | Freq: Four times a day (QID) | SUBCUTANEOUS | Status: DC
  Administered 2020-09-04 – 2020-09-05 (×4): 1 [IU] via SUBCUTANEOUS

## 2020-09-04 MED ORDER — LACTATED RINGERS IV SOLN: INTRAVENOUS | Status: DC

## 2020-09-04 NOTE — Progress Notes (Signed)
NAME:  [name], MRN:  [id number], DOB:  05-18-1951, LOS: 7 ADMISSION DATE:  08/28/2020, CONSULTATION DATE:  08/28/2020 REFERRING MD:  ED, CHIEF COMPLAINT:  Found down   Brief History   69 yo F who presented after being found down, hypoxemic the ED and intubated in the setting of suspected aspiration PNA in setting of large hiatal hernia.   History of present illness   69 yo F with a history of hiatal hernia, schatski ring, hyponatremia who was found down at home by her brother after she was last known normal one day PTA.   When EMS arrived, her O2 sats were in the 51s and had difficulty breathing.  She was thus placed on NRB, and when assessed in the ED, decision was made to intubate.   CXR showed right sided atelectasis and likely infiltrate.    Brother is her primary caregiver.  He says that she has had a rough year, with frequent admissions for lightheadedness and confusion and found to be hyponatremic and hypokalemic.  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  She has lost appetite, has episodes of dry heaving and vomiting- usually when she has low Na and low K.  Normally, she has a gradual clinical decline before admission for hyponatremia, however the events over the last 24 hours PTA were very acute.  Patient lives alone, manages her own meds.  She has had substantial weight loss over the last 1 year.    Past Medical History  Hiatal hernia Hyponatremia Scatski ring Current smoker SIADH - HCTZ contribution, discontinued   Significant Hospital Events   TPN started 10/22   Consults:    Procedures:  Oral Et   10/17 >>>  10/23  R [facility]    10/23 >>>  Significant Diagnostic Tests:  CXR 10/17 >> Right mid to lower lung field density, likely atelectasis. Aspiration is not excluded. CT Head 10/17 >> no acute process CT Chest 10/17 >> LLL collapse, consolidation bilateral  bases, probable aspiration   Micro Data:  COVID 10/17 >> negative  Influenza A/B 10/17 >> negative MRSA PCR 10/18 >> negative  BCx 2 10/17 >> negative  UC 10/17 >> 30k proteus >> S-cefazolin, ceftriaxone.    Antimicrobials:  Ceftriaxone 10/17 >> Azithromycin 10/17 >>   Scheduled Meds: . budesonide (PULMICORT) nebulizer solution  0.5 mg Nebulization BID  . chlorhexidine gluconate (MEDLINE KIT)  15 mL Mouth Rinse BID  . Chlorhexidine Gluconate Cloth  6 each Topical Daily  . heparin  5,000 Units Subcutaneous Q8H  . insulin aspart  0-9 Units Subcutaneous Q4H  . mouth rinse  15 mL Mouth Rinse 10 times per day  . metoprolol tartrate  2.5 mg Intravenous Q6H  . nicotine  21 mg Transdermal Daily  . sodium chloride flush  10-40 mL Intracatheter Q12H   Continuous Infusions: . dexmedetomidine (PRECEDEX) IV infusion Stopped (09/03/20 1413)  . lactated ringers 35 mL/hr at 09/04/20 0711  . TPN ADULT (ION) 40 mL/hr at 09/04/20 0500   PRN Meds:.acetaminophen, docusate, fentaNYL (SUBLIMAZE) injection, levalbuterol, metoprolol tartrate, midazolam, polyethylene glycol, sodium chloride flush    Interim history/subjective:   mild increased wob supine but sats fine on 4lpm  Objective   Blood pressure 134/79, pulse 98, temperature 99.1 F (37.3 C), resp. rate (!) 22, height '5\' 2"'  (1.575 m), weight 45.6 kg, SpO2 100 %.    Vent Mode: PRVC FiO2 (%):  [  30 %] 30 % Set Rate:  [16 bmp] 16 bmp Vt Set:  [400 mL] 400 mL PEEP:  [5 cmH20] 5 cmH20 Plateau Pressure:  [15 cmH20] 15 cmH20   Intake/Output Summary (Last 24 hours) at 09/04/2020 0749 Last data filed at 09/04/2020 0600 Gross per 24 hour  Intake 2434.45 ml  Output 825 ml  Net 1609.45 ml   Filed Weights   09/02/20 1157 09/03/20 0500 09/04/20 0500  Weight: 46.4 kg 46.1 kg 45.6 kg    Examination: Tmax  99.9   On 4lpm NP Pt alert, approp nad @ 30 degrees hob  No jvd Oropharynx clear,  mucosa nl Neck supple Lungs with distant bs  bilaterally RRR no s3 or or sign murmur Abd soft/ pos hoover's    Extr warm with no edema or clubbing noted Neuro  Sensorium intact,  no apparent motor deficits           Assessment & Plan:   69 yo F with a recent significant functional decline, episodes of hyponatremia (due to thiazides, hypovolemia, and ?SIADH), weight loss who presented after being found down and hypoxic, intubated in the ED for airway protection.   Acute hypoxic respiratory failure Suspected Aspiration PNA vs CAP Suspected aspirationg based on CXR  -continue abx as per flowsheet above - up in chair as much as possible - ST eval before feeding but ok for sips and chips >>> recheck cxr am 10/25  >>> titrate 02 to sats > 94%     Acute Metabolic Encephalopathy Unclear etiology- may be due to hypoxia and/or metabolic abnormalities.  CT head, EEG neg. -supportive care  -delirium prevention measures  -appropriate am 10/24 s/p extubation 10/23       AKI resolved  Hypokalemia  Mild rhabdo (CK 1600 on admission) - improving Lab Results  Component Value Date   CREATININE 0.56 09/04/2020   CREATININE 0.69 09/03/2020   CREATININE 0.92 09/02/2020   -Replace electrolytes as indicated -Avoid nephrotoxic agents, ensure adequate renal perfusion  Possible Proteus UTI vs Colonization  UC on admit obtained with 30k proteus, pt unable to state if she is having symptoms  -abx as  Per flowsheet above  HTN -continue lopressor -follow BP trend  Hiatal Hernia Esophageal Web  Vomiting  -HOB elevated  -aspiration precautions     Failure to thrive Severe Protein Calorie Malnutrition  Unintentional weight loss, admissions for metabolic abnormalities.  Could be due to her large hiatal hernia, and esophageal web.  CT head and chest neg for underlying cancer and /or paraneoplastic syndromes.  -not a candidate for intervention for repair at this point  -unable to provide TF> tpn started 10/22   Probable COPD s/p  smoking cessation at admit - rx pulmicort/ brovana bid    Best practice:  Diet: siips /chips Pain/Anxiety/Delirium protocol (if indicated): d/c all sedation s/p extubation  VAP protocol (if indicated): yes DVT prophylaxis: heparin SQ GI prophylaxis: pepcid in TPN Glucose control: SSI q4 Mobility: Bedrest Code Status: Full Family Communication: Brother, [name], updated on plan of care 10/21.  Reviewed findings of severe hiatal hernia, & subsequent malnutrition.  We discussed plan of care for [name] to include continuing to provide ventilator support, working toward successful extubation, attempting nutritional support and ensuring she is not suffering. Our hope is to liberate her from mechanical ventilation and she can work toward a better nutritional status.  He is concerned about her returning home as he worries she is not able to care for herself.  He is in agreement to change her code status to DNR, no reintubation if extubated and continuing medical efforts for restoring health as long as appropriate but if she declines to focus on her comfort.   Disposition: ICU > step down status as of 10/24        Christinia Gully, MD Pulmonary and [place] [phone]   After 7:00 pm call Elink  [phone]

## 2020-09-04 NOTE — Evaluation (Signed)
Clinical/Bedside Swallow Evaluation Patient Details  Name: [name] MRN: [id number] Date of Birth: 25-Apr-1951  Today's Date: 09/04/2020 Time: SLP Start Time (ACUTE ONLY): 1450 SLP Stop Time (ACUTE ONLY): 1524 SLP Time Calculation (min) (ACUTE ONLY): 34 min  Past Medical History:  Past Medical History:  Diagnosis Date  . Abdominal pain, epigastric   . Acid reflux    Pt was born with acute acid  . Anxiety   . GERD (gastroesophageal reflux disease)   . Heart murmur   . Hiatal hernia   . Hypertension   . Hyponatremia 02/10/2020  . Schatzki's ring   . Ulcer    8 years ago   Past Surgical History:  Past Surgical History:  Procedure Laterality Date  . ABDOMINAL HYSTERECTOMY     complete for fibroids  . KNEE ARTHROSCOPY     right knee  . shoulder repalcement     right  . TONSILLECTOMY AND ADENOIDECTOMY     HPI:  69 yo F with a history of hiatal hernia, schatski ring, hyponatremia who was found down at home by her brother after she was last known normal yesterday.  When EMS arrived, her o2 sats were in the 108s and had difficulty breathing.  She was thus placed on NRB, and when assessed in the ED, decision was made to intubate.   CXR showed right sided atelectasis and likely infiltrate 09/04/20 CXR indicated: Continued hazy opacity at the bases where there was atelectasis and .airspace disease on recent chest CT. There is also bilateral pleural Effusion.  Pt was intubated from 08/28/20-09/03/20.  Hx of esophageal symptoms of regurgitation and globus sensation.  BSE generated.  Pt on TPN for nutrition/hydration at current time.  Assessment / Plan / Recommendation Clinical Impression  Pt presents with pharyngoesophageal dysphagia characterized by delayed congested cough and multiple swallows with various consistencies assessed including ice chips, thin via tsp/cup, puree and soft solids.  Pt with hx of large hiatal hernia and significant weight loss over the last year of 50+ lbs.   Pt has increased satiety, but exhibits regurgitation and belching with intake.  She underwent esophageal dilation in past (x2); Recommend MBS to r/o aspiration related to pharyngeal/esophageal symptoms paired with low grade fever, recent intubation of 6 days all exacerbating prior dysphagia.  ST will f/u for MBS and determine tx plan based on objective findingds. SLP Visit Diagnosis: Dysphagia, pharyngoesophageal phase (R13.14)    Aspiration Risk  Moderate aspiration risk;Severe aspiration risk;Risk for inadequate nutrition/hydration    Diet Recommendation   NPO (ice chips allowed intermittently w/ oral care before/after intake)  Medication Administration: Via alternative means    Other  Recommendations Oral Care Recommendations: Oral care QID   Follow up Recommendations Other (comment) (TBD)      Frequency and Duration     Pending objective study       Prognosis Prognosis for Safe Diet Advancement: Fair Barriers to Reach Goals: Severity of deficits      Swallow Study   General Date of Onset: 08/28/20 HPI: 69 yo F with a history of hiatal hernia, schatski ring, hyponatremia who was found down at home by her brother after she was last known normal yesterday.  When EMS arrived, her o2 sats were in the 40s and had difficulty breathing.  She was thus placed on NRB, and when assessed in the ED, decision was made to intubate.   CXR showed right sided atelectasis and likely infiltrate Type of Study: Bedside Swallow Evaluation Previous Swallow Assessment:  n/a Diet Prior to this Study: NPO Temperature Spikes Noted: Yes (low grade) Respiratory Status: Nasal cannula History of Recent Intubation: Yes Length of Intubations (days): 6 days Date extubated: 09/03/20 Behavior/Cognition: Alert;Cooperative Oral Cavity Assessment: Dry;Other (comment) (lips dry/cracked) Oral Care Completed by SLP: Recent completion by staff Oral Cavity - Dentition: Adequate natural dentition Vision: Functional for  self-feeding Self-Feeding Abilities: Able to feed self;Needs assist Patient Positioning: Upright in bed Baseline Vocal Quality: Breathy;Low vocal intensity Volitional Cough: Weak;Congested Volitional Swallow: Able to elicit    Oral/Motor/Sensory Function Overall Oral Motor/Sensory Function: Generalized oral weakness   Ice Chips Ice chips: Impaired Presentation: Spoon Pharyngeal Phase Impairments: Multiple swallows;Cough - Delayed   Thin Liquid Thin Liquid: Impaired Presentation: Cup;Spoon Pharyngeal  Phase Impairments: Multiple swallows;Cough - Delayed    Nectar Thick Nectar Thick Liquid: Not tested   Honey Thick Honey Thick Liquid: Not tested   Puree Puree: Impaired Presentation: Spoon Pharyngeal Phase Impairments: Multiple swallows;Cough - Delayed   Solid     Solid: Impaired Presentation: Self Fed Pharyngeal Phase Impairments: Cough - Delayed      Elvina Sidle, M.S., CCC-SLP 09/04/2020,3:52 PM

## 2020-09-04 NOTE — Progress Notes (Signed)
Patient requested code status be changed back to FULL. Code status was changed to DNR by brother while patient was intubated. Patient is alert and oriented x4; Dr. Melvyn Novas notified.

## 2020-09-04 NOTE — Progress Notes (Signed)
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 Usual Weight:   Assessment: 69 yo F found down at home by brother. Pharmacy consulted 10/22 to start TPN for nutrition. She is currently intubated and NGT placement unsuccessful 2nd hiatal hernia.   PMH: large hiatal hernia, schatski ring, and hyponatremia, smoker  Glucose / Insulin: no hx DM, CBGs adequately controlled (goal 100-150) - 5 units SSI yesterday Electrolytes: K, Mg, and Phos all improved to WNL after replacement yesterday; Na, Cl, Ca remain stable WNL Renal: AKI resolved, SCr, BUN, bicarb now stable WNL; UOP adequate Hepatic:  LFTs slightly elevated on admit, now WNL; tbili stable WNL; albumin now low (normal on admission but may have been dehydrated Prealbumin: low Triglycerides: WNL I/O:  - MIVF: LR @ 35 ml/hr GI Imaging: Surgeries / Procedures:   Central access: PICC placed 10/22 for TPN TPN start date: 10/22  Nutritional Goals (per RD recommendation on 10/22): kCal: 1323, Protein: >=73, Fluid: >+ 1.6 L/day Goal TPN rate is 55 mL/hr (provides 74 g of protein and 1365 kcals per day)  Current Nutrition:  NPO  TPN @ 40 ml/hr  Plan:   May still be refeeding somewhat, but with improvement in lytes after supplementation, will cautiously advance TPN to 55 ml/hr  Electrolytes in TPN: no changes; standard concentrations  Na - 86mEq/L  K - 58mEq/L  Ca - 59mEq/L  Mg - 61mEq/L  Phos - 59mmol/L  Cl:Ac ratio 1:1  Add standard MVI and trace elements to TPN; continue Pepcid added to TPN  Continue sensitive SSI; reduce CBG checks to q6 hr  Reduce MIVF to 20 mL/hr at 1800  Monitor TPN labs on Mon/Thurs  Reuel Boom, PharmD, BCPS [phone] 09/04/2020, 7:22 AM

## 2020-09-05 ENCOUNTER — Inpatient Hospital Stay (HOSPITAL_COMMUNITY): Payer: Medicare Other

## 2020-09-05 DIAGNOSIS — E871 Hypo-osmolality and hyponatremia: Secondary | ICD-10-CM | POA: Diagnosis not present

## 2020-09-05 DIAGNOSIS — R627 Adult failure to thrive: Secondary | ICD-10-CM | POA: Diagnosis not present

## 2020-09-05 DIAGNOSIS — R000 Tachycardia, unspecified: Secondary | ICD-10-CM | POA: Diagnosis not present

## 2020-09-05 DIAGNOSIS — E43 Unspecified severe protein-calorie malnutrition: Secondary | ICD-10-CM | POA: Diagnosis not present

## 2020-09-05 LAB — DIFFERENTIAL
Abs Immature Granulocytes: 0.27 10*3/uL — ABNORMAL HIGH (ref 0.00–0.07)
Basophils Absolute: 0.1 10*3/uL (ref 0.0–0.1)
Basophils Relative: 0 %
Eosinophils Absolute: 0.1 10*3/uL (ref 0.0–0.5)
Eosinophils Relative: 1 %
Immature Granulocytes: 2 %
Lymphocytes Relative: 8 %
Lymphs Abs: 1.2 10*3/uL (ref 0.7–4.0)
Monocytes Absolute: 1 10*3/uL (ref 0.1–1.0)
Monocytes Relative: 7 %
Neutro Abs: 11.2 10*3/uL — ABNORMAL HIGH (ref 1.7–7.7)
Neutrophils Relative %: 82 %

## 2020-09-05 LAB — BASIC METABOLIC PANEL
Anion gap: 6 (ref 5–15)
BUN: 23 mg/dL (ref 8–23)
CO2: 29 mmol/L (ref 22–32)
Calcium: 8.1 mg/dL — ABNORMAL LOW (ref 8.9–10.3)
Chloride: 107 mmol/L (ref 98–111)
Creatinine, Ser: 0.51 mg/dL (ref 0.44–1.00)
GFR, Estimated: >60 mL/min (ref >60)
Glucose, Bld: 137 mg/dL — ABNORMAL HIGH (ref 70–99)
Potassium: 4 mmol/L (ref 3.5–5.1)
Sodium: 142 mmol/L (ref 135–145)

## 2020-09-05 LAB — CBC
HCT: 29.1 % — ABNORMAL LOW (ref 36.0–46.0)
HCT: 30.5 % — ABNORMAL LOW (ref 36.0–46.0)
Hemoglobin: 9.2 g/dL — ABNORMAL LOW (ref 12.0–15.0)
Hemoglobin: 9.7 g/dL — ABNORMAL LOW (ref 12.0–15.0)
MCH: 30.9 pg (ref 26.0–34.0)
MCH: 30 pg (ref 26.0–34.0)
MCHC: 31.6 g/dL (ref 30.0–36.0)
MCHC: 31.8 g/dL (ref 30.0–36.0)
MCV: 97.7 fL (ref 80.0–100.0)
MCV: 94.4 fL (ref 80.0–100.0)
Platelets: 255 10*3/uL (ref 150–400)
Platelets: 253 10*3/uL (ref 150–400)
RBC: 2.98 MIL/uL — ABNORMAL LOW (ref 3.87–5.11)
RBC: 3.23 MIL/uL — ABNORMAL LOW (ref 3.87–5.11)
RDW: 15 % (ref 11.5–15.5)
RDW: 14.6 % (ref 11.5–15.5)
WBC: 13.8 10*3/uL — ABNORMAL HIGH (ref 4.0–10.5)
WBC: 15.4 10*3/uL — ABNORMAL HIGH (ref 4.0–10.5)
nRBC: 0 % (ref 0.0–0.2)
nRBC: 0 % (ref 0.0–0.2)

## 2020-09-05 LAB — COMPREHENSIVE METABOLIC PANEL
ALT: 32 U/L (ref 0–44)
AST: 39 U/L (ref 15–41)
Albumin: 2 g/dL — ABNORMAL LOW (ref 3.5–5.0)
Alkaline Phosphatase: 140 U/L — ABNORMAL HIGH (ref 38–126)
Anion gap: 4 — ABNORMAL LOW (ref 5–15)
BUN: 21 mg/dL (ref 8–23)
CO2: 27 mmol/L (ref 22–32)
Calcium: 7.9 mg/dL — ABNORMAL LOW (ref 8.9–10.3)
Chloride: 104 mmol/L (ref 98–111)
Creatinine, Ser: 0.61 mg/dL (ref 0.44–1.00)
GFR, Estimated: >60 mL/min (ref >60)
Glucose, Bld: 797 mg/dL (ref 70–99)
Potassium: 6.8 mmol/L (ref 3.5–5.1)
Sodium: 135 mmol/L (ref 135–145)
Total Bilirubin: <0.1 mg/dL — ABNORMAL LOW (ref 0.3–1.2)
Total Protein: 4.2 g/dL — ABNORMAL LOW (ref 6.5–8.1)

## 2020-09-05 LAB — PHOSPHORUS
Phosphorus: 5 mg/dL — ABNORMAL HIGH (ref 2.5–4.6)
Phosphorus: 2.3 mg/dL — ABNORMAL LOW (ref 2.5–4.6)

## 2020-09-05 LAB — TRIGLYCERIDES
Triglycerides: 462 mg/dL — ABNORMAL HIGH (ref <150)
Triglycerides: 115 mg/dL (ref <150)

## 2020-09-05 LAB — HEPATIC FUNCTION PANEL
ALT: 40 U/L (ref 0–44)
AST: 45 U/L — ABNORMAL HIGH (ref 15–41)
Albumin: 2.2 g/dL — ABNORMAL LOW (ref 3.5–5.0)
Alkaline Phosphatase: 156 U/L — ABNORMAL HIGH (ref 38–126)
Bilirubin, Direct: 0.1 mg/dL (ref 0.0–0.2)
Indirect Bilirubin: 0.2 mg/dL — ABNORMAL LOW (ref 0.3–0.9)
Total Bilirubin: 0.3 mg/dL (ref 0.3–1.2)
Total Protein: 4.8 g/dL — ABNORMAL LOW (ref 6.5–8.1)

## 2020-09-05 LAB — MAGNESIUM
Magnesium: 2.3 mg/dL (ref 1.7–2.4)
Magnesium: 1.9 mg/dL (ref 1.7–2.4)

## 2020-09-05 LAB — PREALBUMIN: Prealbumin: 7.4 mg/dL — ABNORMAL LOW (ref 18–38)

## 2020-09-05 LAB — GLUCOSE, CAPILLARY
Glucose-Capillary: 147 mg/dL — ABNORMAL HIGH (ref 70–99)
Glucose-Capillary: 130 mg/dL — ABNORMAL HIGH (ref 70–99)
Glucose-Capillary: 158 mg/dL — ABNORMAL HIGH (ref 70–99)
Glucose-Capillary: 118 mg/dL — ABNORMAL HIGH (ref 70–99)

## 2020-09-05 MED ORDER — KATE FARMS STANDARD 1.4 PO LIQD
325.0000 mL | Freq: Two times a day (BID) | ORAL | Status: DC
  Administered 2020-09-05 – 2020-09-12 (×7): 325 mL via ORAL
  Filled 2020-09-05 (×20): qty 325

## 2020-09-05 MED ORDER — CHLORHEXIDINE GLUCONATE 0.12 % MT SOLN
15.0000 mL | Freq: Two times a day (BID) | OROMUCOSAL | Status: DC
  Administered 2020-09-05 – 2020-09-14 (×17): 15 mL via OROMUCOSAL
  Filled 2020-09-05 (×17): qty 15

## 2020-09-05 MED ORDER — ORAL CARE MOUTH RINSE
15.0000 mL | Freq: Two times a day (BID) | OROMUCOSAL | Status: DC
  Administered 2020-09-05 – 2020-09-13 (×17): 15 mL via OROMUCOSAL

## 2020-09-05 MED ORDER — INSULIN ASPART 100 UNIT/ML ~~LOC~~ SOLN
0.0000 [IU] | Freq: Three times a day (TID) | SUBCUTANEOUS | Status: DC
  Administered 2020-09-05: 2 [IU] via SUBCUTANEOUS
  Administered 2020-09-06: 1 [IU] via SUBCUTANEOUS

## 2020-09-05 MED ORDER — TRAVASOL 10 % IV SOLN
INTRAVENOUS | Status: AC
  Filled 2020-09-05: qty 739.2

## 2020-09-05 MED ORDER — ENOXAPARIN SODIUM 30 MG/0.3ML ~~LOC~~ SOLN
30.0000 mg | SUBCUTANEOUS | Status: DC
  Administered 2020-09-05 – 2020-09-13 (×9): 30 mg via SUBCUTANEOUS
  Filled 2020-09-05 (×9): qty 0.3

## 2020-09-05 MED ORDER — SODIUM PHOSPHATES 45 MMOLE/15ML IV SOLN
15.0000 mmol | Freq: Once | INTRAVENOUS | Status: AC
  Administered 2020-09-05: 15 mmol via INTRAVENOUS
  Filled 2020-09-05: qty 5

## 2020-09-05 MED ORDER — METOPROLOL TARTRATE 5 MG/5ML IV SOLN
5.0000 mg | Freq: Four times a day (QID) | INTRAVENOUS | Status: DC
  Administered 2020-09-05 – 2020-09-06 (×5): 5 mg via INTRAVENOUS
  Filled 2020-09-05 (×5): qty 5

## 2020-09-05 NOTE — TOC Progression Note (Signed)
Transition of Care [facility]) - Progression Note    Patient Details  Name: [name] MRN: [id number] Date of Birth: Jul 18, 1951  Transition [facility] CM/SW Contact  Leeroy Cha, RN Phone Number: 09/05/2020, 7:54 AM  Clinical Narrative:    Pt extubated on [id number] to 4l/min 02 via [place],iv tpn , k-6.8,bg 797,wbc-15.4 Following for progression and toc needs.   Expected Discharge Plan: Home/Self Care Barriers to Discharge: Barriers Unresolved (comment)  Expected Discharge Plan and Services Expected Discharge Plan: Home/Self Care   Discharge Planning Services: CM Consult   Living arrangements for the past 2 months: Apartment                                       Social Determinants of Health (SDOH) Interventions    Readmission Risk Interventions No flowsheet data found.

## 2020-09-05 NOTE — Progress Notes (Signed)
NAME:  [name], MRN:  [id number], DOB:  15-Jul-1951, LOS: 8 ADMISSION DATE:  08/28/2020, CONSULTATION DATE:  08/28/2020 REFERRING MD:  ED, CHIEF COMPLAINT:  Found down   Brief History   69 yo F who presented after being found down, hypoxemic the ED and intubated in the setting of suspected aspiration PNA in setting of large hiatal hernia.   History of present illness   69 yo F with a history of hiatal hernia, schatski ring, hyponatremia who was found down at home by her brother after she was last known normal one day PTA.   When EMS arrived, her O2 sats were in the 17s and had difficulty breathing.  She was thus placed on NRB, and when assessed in the ED, decision was made to intubate.   CXR showed right sided atelectasis and likely infiltrate.    Brother is her primary caregiver.  He says that she has had a rough year, with frequent admissions for lightheadedness and confusion and found to be hyponatremic and hypokalemic.  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  She has lost appetite, has episodes of dry heaving and vomiting- usually when she has low Na and low K.  Normally, she has a gradual clinical decline before admission for hyponatremia, however the events over the last 24 hours PTA were very acute.  Patient lives alone, manages her own meds.  She has had substantial weight loss over the last 1 year.    Past Medical History  Hiatal hernia Hyponatremia Scatski ring Current smoker SIADH - HCTZ contribution, discontinued   Significant Hospital Events   TPN started 10/22   Consults:    Procedures:  Oral Et   10/17 >>>  10/23  R PIC    10/23 >>>  Significant Diagnostic Tests:  CXR 10/17 >> Right mid to lower lung field density, likely atelectasis. Aspiration is not excluded. CT Head 10/17 >> no acute process CT Chest 10/17 >> LLL collapse, consolidation bilateral  bases, probable aspiration   Micro Data:  COVID 10/17 >> negative  Influenza A/B 10/17 >> negative MRSA PCR 10/18 >> negative  BCx 2 10/17 >> negative  UC 10/17 >> 30k proteus >> S-cefazolin, ceftriaxone.    Antimicrobials:  Ceftriaxone 10/17 >> Azithromycin 10/17 >>   Scheduled Meds: . arformoterol  15 mcg Nebulization BID  . budesonide (PULMICORT) nebulizer solution  0.25 mg Nebulization BID  . chlorhexidine gluconate (MEDLINE KIT)  15 mL Mouth Rinse BID  . Chlorhexidine Gluconate Cloth  6 each Topical Daily  . heparin  5,000 Units Subcutaneous Q8H  . insulin aspart  0-9 Units Subcutaneous Q6H  . mouth rinse  15 mL Mouth Rinse 10 times per day  . metoprolol tartrate  2.5 mg Intravenous Q6H  . nicotine  21 mg Transdermal Daily  . sodium chloride flush  10-40 mL Intracatheter Q12H   Continuous Infusions: . lactated ringers 20 mL/hr at 09/05/20 0400  . TPN ADULT (ION) 55 mL/hr at 09/05/20 0400   PRN Meds:.acetaminophen, docusate, levalbuterol, metoprolol tartrate, polyethylene glycol, sodium chloride flush    Interim history/subjective:  No overnight issues.   Objective   Blood pressure (!) 167/103, pulse (!) 115, temperature 98.4 F (36.9 C), temperature source Axillary, resp. rate (!) 28, height '5\' 2"'  (1.575 m), weight 46 kg, SpO2 100 %.        Intake/Output Summary (Last 24 hours) at  09/05/2020 0923 Last data filed at 09/05/2020 0600 Gross per 24 hour  Intake 1481.96 ml  Output 350 ml  Net 1131.96 ml   Filed Weights   09/03/20 0500 09/04/20 0500 09/05/20 0449  Weight: 46.1 kg 45.6 kg 46 kg    Examination: Tmax  99.9   On 4lpm NP Pt alert, resting comfortably, no distress Oropharynx clear,  mucosa nl Lungs with distant bs bilaterally RRR Sinus tachycardia, no mrg Abd soft, nontender Extr warm with no edema or clubbing noted Neuro  Sensorium intact,  no apparent motor deficits, AOx4     Assessment & Plan:   69 yo F with a recent significant  functional decline, episodes of hyponatremia (due to thiazides, hypovolemia, and ?SIADH), weight loss who presented after being found down and hypoxic, intubated in the ED for airway protection.   Acute hypoxic respiratory failure Suspected Aspiration PNA vs CAP Suspected aspiration based on CXR  - has completed course of abx for pna. - up in chair as much as possible - ST eval before feeding but ok for sips and chips. Plan is for MBS  Acute Metabolic Encephalopathy  - improved -delirium prevention measures  -appropriate am 10/24 s/p extubation 10/23    AKI resolved  Hypokalemia  Mild rhabdo (CK 1600 on admission) - improving Lab Results  Component Value Date   CREATININE 0.51 09/05/2020   CREATININE 0.61 09/05/2020   CREATININE 0.56 09/04/2020   -Replace electrolytes as indicated -Avoid nephrotoxic agents, ensure adequate renal perfusion  Possible Proteus UTI vs Colonization  UC on admit obtained with 30k proteus, pt unable to state if she is having symptoms  -abx as  Per flowsheet above  Sinus Tachycardia with underlying HTN - will increase metoprolol schedule IV with goal HR<110 - suspect worsened due to catabolic state from malnutrition  Hiatal Hernia Esophageal Web  Vomiting  -HOB elevated  -aspiration precautions   Failure to thrive Severe Protein Calorie Malnutrition  Unintentional weight loss, admissions for metabolic abnormalities.  Could be due to her large hiatal hernia, and esophageal web.  CT head and chest neg for underlying cancer and /or paraneoplastic syndromes.  -not a candidate for intervention for repair at this point  -unable to provide TF> tpn started 10/22   Probable COPD s/p smoking cessation at admit - rx pulmicort/ brovana bid    Best practice:  Diet: sips /chips Pain/Anxiety/Delirium protocol (if indicated): n/a VAP protocol (if indicated): yes DVT prophylaxis: heparin SQ GI prophylaxis: pepcid in TPN Glucose control: SSI  q4 Mobility: Bedrest Code Status: Full Family Communication: Patient updated at bedside Disposition: ok to transfer to [facility]. Will transfer to [facility] to assume care 10/26

## 2020-09-05 NOTE — Progress Notes (Addendum)
Nutrition Follow-up  DOCUMENTATION CODES:   Non-severe (moderate) malnutrition in context of chronic illness, Underweight  INTERVENTION:  - continue TPN regimen per Pharmacist. - diet advancement to Dysphagia 3, thin liquids per SLP recommendation following MBS.  - will order Magic Cup BID with meals, each supplement provides 290 kcal and 9 grams of protein. - will order Dillard [place] 1.4 po BID, each supplement provides 455 kcal and 20 grams protein. - recommend using Magic Cup or Dillard Essex to provide any PO medications.   NUTRITION DIAGNOSIS:   Moderate Malnutrition related to chronic illness (chronic hyponatremia associated with lightheadedness and vomiting) as evidenced by moderate fat depletion, moderate muscle depletion, severe muscle depletion. -ongoing  GOAL:   Patient will meet greater than or equal to 90% of their needs -unmet currently  MONITOR:   Diet advancement, Labs, Weight trends, Other (Comment) (TPN regimen)  REASON FOR ASSESSMENT:   Consult New TPN/TNA  ASSESSMENT:   69 year-old female with medical history of large hiatal hernia, schatski ring, and hyponatremia. She was found down at home by her brother after she was last known normal 10/16. When EMS arrived, her O2 sats were in the 16s and she was having difficulty breathing. She was intubated in the ED on 10/17. CXR showed right sided atelectasis and likely infiltrate. In the ED, her brother reported that she has had frequent admissions for lightheadedness and confusion. 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 She has had a poor appetite and has episodes of dry heaving and vomiting, usually when she has low Na and low K. Brother reported significant weight loss over the past 1 year.  On 10/22, patient was extubated, double lumen PICC was placed in R brachial, and TPN was started. She is currently receiving custom TPN @ 55 ml/hr (current goal rate) which is providing 1365 kcal (85% re-estimated kcal need)  and 74 grams protein (99% re-estimated protein need).   Patient is currently off the unit for MBS. She has been allowed sips and chips. Able to talk with RN who reports patient is a/o x4.   Weight has been stable since 10/19. Non-pitting edema to RUE documented in the flow sheet.   Per notes: - acute hypoxic respiratory failure thought to be 2/2 aspiration PNA vs CAP - AKI--resolved - possible UTI vs colonization - large hiatal hernia--not a candidate for surgical intervention at this time  - FTT with protein calorie malnutrition-- CT head and chest negative for cancer or paraneoplastic syndrome - probable COPD   ADDENDUM: Able to talk with SLP about her recommendations following MBS and Pharmacist about TPN regimen and plan concerning oral nutrition.     Labs reviewed; CBG: 130 mg/dl, Ca: 8.1 mg/dl, Phos: 2.3 mg/dl. K and Mg are WDL and triglycerides: 115 mg/dl.  Medications reviewed; sliding scale novolog, 15 mmol IV NaPhos x1 run 10/25. IVF; LR @ 20 ml/hr.    Diet Order:   Diet Order            Diet NPO time specified  Diet effective now                 EDUCATION NEEDS:   No education needs have been identified at this time  Skin:  Skin Assessment: Reviewed RN Assessment  Last BM:  10/24 (type 7)  Height:   Ht Readings from Last 1 Encounters:  08/28/20 5\' 2"  (1.575 m)    Weight:   Wt Readings from Last 1 Encounters:  09/05/20 46 kg  Estimated Nutritional Needs:  Kcal:  1610-1840 kcal Protein:  75-85 grams Fluid:  >/= 1.6 L/day     Jarome Matin, MS, RD, LDN, CNSC Inpatient Clinical Dietitian RD pager # available in [facility]  After hours/weekend pager # available in [facility]

## 2020-09-05 NOTE — Progress Notes (Signed)
PHARMACY - TOTAL PARENTERAL NUTRITION CONSULT NOTE   Indication: chronic intolerance to enteral feeding  Patient Measurements: Height: _0  (157.5 cm) Weight: 46 kg (101 lb 6.6 oz) IBW/kg (Calculated) : 50.1 TPN AdjBW (KG): 36.3 Body mass index is 18.55 kg/m. Usual Weight:   Assessment: 69 yo F found down at home by brother. Pharmacy consulted 10/22 to start TPN for nutrition. She is currently intubated and NGT placement unsuccessful 2nd hiatal hernia.   PMH: large hiatal hernia, schatski ring, and hyponatremia, smoker  10/25: note initial AM labs grossly out of range; suspect bad blood sample. Redrawn labs consistent with previous values; may disregard initial labs.  Glucose / Insulin: no hx DM, CBGs adequately controlled (goal 100-150) on goal rate TPN - 4 units SSI yesterday Electrolytes: All stable WNL except Phos now borderline low Renal: AKI resolved, SCr, BUN, now stable WNL; bicarb WNL but rising slowly; UOP does not appear completely charted Hepatic: Alk Phos slightly elevated (not unexpected w/ TPN), otherwise LFTs and0 tbili stable WNL; albumin low/stable (normal on admission but may have been dehydrated) (10/25) Prealbumin: low (10/23) - not recollected with second blood draw this AM Triglycerides: WNL (10/25) I/O:  - MIVF: LR @ 20 ml/hr GI Imaging: Surgeries / Procedures:   Central access: PICC placed 10/22 for TPN TPN start date: 10/22  Nutritional Goals (per RD recommendation on 10/22): kCal: 1323, Protein: >=73, Fluid: >+ 1.6 L/day Goal TPN rate is 55 mL/hr (provides 74 g of protein and 1365 kcals per day)  Current Nutrition:  NPO  TPN @ 55 ml/hr  Plan:   NaPhos 15 mmol IV x 1   Continue TPN at 55 ml/hr, may still be refeeding somewhat  Electrolytes in TPN: incr phos, Mg  Na - 50 mEq/L  K - 50 mEq/L  Ca - 5 mEq/L  Mg - 10 mEq/L  Phos - 20 mmol/L  Cl:Ac ratio 1:1  Add standard MVI and trace elements to TPN; continue Pepcid added to  TPN  Continue sensitive SSI; reduce CBG checks to q8 hr  Continue MIVF at 20 mL/hr; further changes per MD  Monitor TPN labs on Mon/Thurs  Prealbumin, Bmet, Mg, Phos tomorrow  Reuel Boom, PharmD, BCPS [phone] 09/05/2020, 8:00 AM

## 2020-09-05 NOTE — Progress Notes (Signed)
SLP Note  Patient Details Name: [name] MRN: [id number] DOB: 09-Sep-1951   MBS ordered and scheduled for 1200 this date. Report to follow. RN aware.  Sosha Shepherd B. Quentin Ore, [facility], [place] Speech Language Pathologist Office: [phone] Pager: [phone]  Shonna Chock 09/05/2020, 9:07 AM

## 2020-09-05 NOTE — Progress Notes (Signed)
CRITICAL VALUE ALERT  Critical Value:  Potassium 6.8, glucose 797  Date & Time Notied:  10/25 @ 0530  Provider Notified: elink informed  Orders Received/Actions taken: check bedside cbg and redraw labs

## 2020-09-05 NOTE — Progress Notes (Signed)
Modified Barium Swallow Progress Note  Patient Details  Name: [name] MRN: [id number] Date of Birth: [birth year]  Today's Date: 09/05/2020  Modified Barium Swallow completed.  Full report located under Chart Review in the Imaging Section.  Brief recommendations include the following:  Clinical Impression Pt exhibits normal oral and pharyngeal swallow function. No oral residue or anterior leakage, and no penetration/aspiration or post-swallow residue seen. Esophageal sweep revealed stasis throughout the upper esophagus several minutes after PO trials had ended. Recommend dys 3 diet with solids cut into small pieces (for energy conservation), thin liquids, meds one at a time. Pt was given written behavioral and dietary strategies for management of esophageal dysmotility. SLP will follow to assess diet tolerance and continue education.   Swallow Evaluation Recommendations  SLP Diet Recommendations: Dysphagia 3 (Mech soft) solids;Thin liquid   Liquid Administration via: Cup;Straw   Medication Administration: Whole meds with liquid   Supervision: Patient able to self feed;Staff to assist with self feeding   Compensations: Minimize environmental distractions;Slow rate;Small sips/bites   Postural Changes: Remain semi-upright after after feeds/meals (Comment);Seated upright at 90 degrees   Oral Care Recommendations: Oral care BID   Behavioral and Dietary Strategies for Management of Esophageal Dysmotility 1. Take reflux medications 30+ minutes before other po intake, in the morning 2. Begin meals with warm beverage 3. Eat smaller more frequent meals 4. Eat slowly, taking small bites and sips 5. Alternate solids and liquids 6. Avoid foods/liquids that increase acid production 7. Sit upright during and for 30+ minutes after meals to facilitate esophageal clearing   [name] B. Quentin Ore, [facility], [place] Speech Language Pathologist Office: [phone] Pager: [phone]  Shonna Chock 09/05/2020,12:55 PM

## 2020-09-06 ENCOUNTER — Inpatient Hospital Stay (HOSPITAL_COMMUNITY): Payer: Medicare Other

## 2020-09-06 ENCOUNTER — Inpatient Hospital Stay: Payer: Self-pay

## 2020-09-06 DIAGNOSIS — J9601 Acute respiratory failure with hypoxia: Secondary | ICD-10-CM | POA: Diagnosis not present

## 2020-09-06 LAB — GLUCOSE, CAPILLARY
Glucose-Capillary: 139 mg/dL — ABNORMAL HIGH (ref 70–99)
Glucose-Capillary: 98 mg/dL (ref 70–99)
Glucose-Capillary: 109 mg/dL — ABNORMAL HIGH (ref 70–99)
Glucose-Capillary: 125 mg/dL — ABNORMAL HIGH (ref 70–99)
Glucose-Capillary: 90 mg/dL (ref 70–99)
Glucose-Capillary: 125 mg/dL — ABNORMAL HIGH (ref 70–99)

## 2020-09-06 LAB — BASIC METABOLIC PANEL
Anion gap: 5 (ref 5–15)
BUN: 24 mg/dL — ABNORMAL HIGH (ref 8–23)
CO2: 30 mmol/L (ref 22–32)
Calcium: 8.1 mg/dL — ABNORMAL LOW (ref 8.9–10.3)
Chloride: 106 mmol/L (ref 98–111)
Creatinine, Ser: 0.46 mg/dL (ref 0.44–1.00)
GFR, Estimated: >60 mL/min (ref >60)
Glucose, Bld: 129 mg/dL — ABNORMAL HIGH (ref 70–99)
Potassium: 4.3 mmol/L (ref 3.5–5.1)
Sodium: 141 mmol/L (ref 135–145)

## 2020-09-06 LAB — BLOOD GAS, ARTERIAL
Acid-Base Excess: 3 mmol/L — ABNORMAL HIGH (ref 0.0–2.0)
Bicarbonate: 29.6 mmol/L — ABNORMAL HIGH (ref 20.0–28.0)
Drawn by: 331471
FIO2: 36
O2 Content: 4 L/min
O2 Saturation: 98.1 %
Patient temperature: 98.6
pCO2 arterial: 54.3 mmHg — ABNORMAL HIGH (ref 32.0–48.0)
pH, Arterial: 7.356 (ref 7.350–7.450)
pO2, Arterial: 132 mmHg — ABNORMAL HIGH (ref 83.0–108.0)

## 2020-09-06 LAB — PHOSPHORUS: Phosphorus: 3.2 mg/dL (ref 2.5–4.6)

## 2020-09-06 LAB — MAGNESIUM: Magnesium: 2 mg/dL (ref 1.7–2.4)

## 2020-09-06 MED ORDER — ACETAMINOPHEN 325 MG PO TABS: 650.0000 mg | ORAL_TABLET | Freq: Four times a day (QID) | ORAL | Status: DC | PRN

## 2020-09-06 MED ORDER — TRAVASOL 10 % IV SOLN
INTRAVENOUS | Status: AC
  Filled 2020-09-06: qty 842.4

## 2020-09-06 MED ORDER — POLYETHYLENE GLYCOL 3350 17 G PO PACK: 17.0000 g | PACK | Freq: Every day | ORAL | Status: DC | PRN

## 2020-09-06 MED ORDER — INSULIN ASPART 100 UNIT/ML ~~LOC~~ SOLN
0.0000 [IU] | Freq: Three times a day (TID) | SUBCUTANEOUS | Status: DC
  Administered 2020-09-07 (×2): 1 [IU] via SUBCUTANEOUS

## 2020-09-06 MED ORDER — SODIUM CHLORIDE 0.9% FLUSH: 10.0000 mL | INTRAVENOUS | Status: DC | PRN

## 2020-09-06 MED ORDER — DOCUSATE SODIUM 50 MG/5ML PO LIQD
100.0000 mg | Freq: Two times a day (BID) | ORAL | Status: DC | PRN
  Administered 2020-09-11: 100 mg via ORAL
  Filled 2020-09-06 (×2): qty 10

## 2020-09-06 MED ORDER — FUROSEMIDE 10 MG/ML IJ SOLN
20.0000 mg | Freq: Once | INTRAMUSCULAR | Status: AC
  Administered 2020-09-06: 20 mg via INTRAVENOUS
  Filled 2020-09-06: qty 2

## 2020-09-06 MED ORDER — METOPROLOL TARTRATE 25 MG PO TABS
25.0000 mg | ORAL_TABLET | Freq: Two times a day (BID) | ORAL | Status: DC
  Administered 2020-09-06 – 2020-09-14 (×16): 25 mg via ORAL
  Filled 2020-09-06 (×17): qty 1

## 2020-09-06 NOTE — Progress Notes (Addendum)
PHARMACY - TOTAL PARENTERAL NUTRITION CONSULT NOTE   Indication: chronic intolerance to enteral feeding  Patient Measurements: Height: '5\' 2"'  (157.5 cm) Weight: 45.7 kg (100 lb 12 oz) IBW/kg (Calculated) : 50.1 TPN AdjBW (KG): 36.3 Body mass index is 18.43 kg/m.  Assessment: 69 yo F found down at home by brother. Pharmacy consulted 10/22 to start TPN for nutrition. She was intubated 10/17-10/23 and NGT placement unsuccessful secondary to hiatal hernia.   PMH: large hiatal hernia, schatski ring, hyponatremia, smoker  10/26: PICC line noted to be leaking this AM per RN and currently being replaced. RN notes that TPN only stopped during PICC line exchange.    Glucose / Insulin: no hx DM, CBGs adequately controlled (goal 100-150)  - 3 units SSI in past 24 hours Electrolytes: All stable/WNL Renal: AKI resolved, SCr WNL/stable.   Hepatic: Alk Phos, AST slightly elevated, otherwise LFTs and T.bili stable/WNL; Albumin low, 2.2 (10/25) Prealbumin: 5.5 (10/23), 7.4 (10/25) Triglycerides: 115, WNL (10/25) I/O: 7482/707 (however, UOP does not appear completely charted) - MIVF: LR @ 70m/hr stopped 10/25 AM GI Imaging: Surgeries / Procedures:   Central access: PICC placed 10/22 for TPN TPN start date: 10/22  Nutritional Goals (per RD recommendation, revised 10/25): kCal: 1610-1840, Protein: 75-85 grams, Fluid: >/= 1.6 L/day Goal TPN rate now 65 mL/hr (provides 84 g of protein and 1617 kcal per day)  Current Nutrition:  Dysphagia 3 diet, thin liquids per SLP KAnda [employer] 1.4, Magic Cup BID ordered per dietician  TPN @ 55 ml/hr  Plan:  At 1800 today:  Increase TPN to 65 ml/hr, new goal rate  Electrolytes in TPN: no change from yesterday   Na - 50 mEq/L  K - 50 mEq/L  Ca - 5 mEq/L  Mg - 10 mEq/L  Phos - 20 mmol/L  Cl:Ac ratio 1:1  Add standard MVI and trace elements to TPN; continue Pepcid added to TPN  Continue sensitive SSI and CBG checks q8h  No MIVF at this time,  any further changes per MD  Monitor TPN labs on Mon/Thurs  CMET, Mag, Phos tomorrow  JLindell Spar PharmD, BCPS [phone]/26/2021, 11:55 AM

## 2020-09-06 NOTE — Progress Notes (Addendum)
PROGRESS NOTE   [name]  UXL:[id number]    DOB: [birth year]    DOA: 08/28/2020  PCP: Nicholes Rough, PA-C   I have briefly reviewed patients previous medical records in [facility].  Chief Complaint  Patient presents with  . Loss of Consciousness    Brief Narrative:  PCCM to [facility] transfer 09/06/2020: 69 year old female with history of hiatal hernia, Schatzki's ring, hyponatremia, tobacco abuse, suspected SIADH, who was found down at home by her brother after she was last known normal per day PTA.  When EMS arrived, she was hypoxic in the sixties and had difficulty breathing.  She was thus placed on NRB, and when assessed in the ED, decision was made to intubate.  She was admitted to ICU for acute respiratory failure with hypoxia due to suspected aspiration pneumonia versus CAP, acute metabolic encephalopathy and acute kidney injury.  Extubated 10/23 and transferred to telemetry 10/26.  Ongoing tachypnea, PCCM reevaluated 10/26.  Additional history below from CCM: "Brother is her primary caregiver.  He says that she has had a rough year, with frequent admissions for lightheadedness and confusion and found to be hyponatremic and hypokalemic.  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  She has lost appetite, has episodes of dry heaving and vomiting- usually when she has low Na and low K.  Normally, she has a gradual clinical decline before admission for hyponatremia, however the events over the last 24 hours PTA were very acute.  Patient lives alone, manages her own meds.  She has had substantial weight loss over the last 1 year."     Assessment & Plan:  Active Problems:   Hyponatremia   Acute and chr resp failure, unsp w hypoxia or hypercapnia (HCC)   Malnutrition of moderate degree   Hiatal hernia  69 year old female with a recent functional decline, episodes of hyponatremia  (due to thiazides, hypovolemia, and?  SIADH), weight loss who presented after being found down and hypoxic, intubated in the ED for airway protection  Acute respiratory failure with hypoxia due to suspected aspiration pneumonia versus CAP Suspected aspiration based on chest x-ray.  Has completed course of antibiotics for pneumonia.  Speech therapy evaluated, performed MBS and recommend regular diet, thin liquids with precautions.  Aggressive pulmonary toilet.  On 10/26 AM, called to bedside due to respiratory distress.  ABG: pH 7.36, PCO2 54, PO2 132, HCO3 29.6 and oxygen saturation 98.1% on 4 L/min oxygen.  Chest x-ray personally reviewed: Bilateral pleural effusions and mild interstitial edema.  PCCM reassessed at my request and indicate that she is able to speak in full sentences and tachypnea is unchanged from yesterday, chest x-ray is unchanged, indicate no need for transfer to ICU at this time.  They also recommended palliative care consultation for [place].  Continue oxygen supplementation, wean as tolerated, incentive spirometer as able, mobilize, aggressive pulmonary toilet, as needed bronchodilators and will give a dose of IV Lasix 20 mg x 1.  Monitor closely.  Dysphagia Speech therapy extensively evaluated and recommended dysphagia 3 diet and thin liquids.  Monitor closely.  If adequate oral intake, consider stopping TPN  Acute metabolic encephalopathy Appears to have improved.  Unclear baseline and will need family's input regarding this.  Delirium precautions.  AKI: Creatinine had peaked to 2.86.  Resolved.  Acute anemia May be due to acute illness and dilutional.  Stable.  Follow daily CBCs.  Hypokalemia: Replaced.  Mild rhabdomyolysis Improved.  Possible Proteus UTI versus colonization Urine culture on admit showed 30 K. Proteus, patient unable to state if she had symptoms at that time.  Completed course of antibiotics.  No dysuria at this time.  Sinus tachycardia May be due to  acute illness.  Currently on IV metoprolol and improving.  Will switch to oral metoprolol for this and hypertension. Check TSH > normal in July.  Essential hypertension: Change IV to scheduled p.o. metoprolol.  Mildly uncontrolled at times.  Hiatal hernia/esophageal web/vomiting Head of bed elevated.  Aspiration precautions.  Body mass index is 18.43 kg/m./Moderate malnutrition/adult failure to thrive History of unintentional weight loss, admission for metabolic abnormalities.  Could be due to her large hiatal hernia and esophageal web.  CT head and chest negative for underlying cancer and/or paraneoplastic syndromes (per PCCM).  They also indicate that she is not a candidate for intervention or repair at this point.  Agree with PCCM recommendations for palliative care consultation for GOC-placed order.    Probable COPD s/p smoking cessation Occasional rhonchi heard this morning.  Continue Pulmicort, Brovana and bronchodilator nebulizations as needed.  Trial of her dose of Lasix  Nutritional Status Nutrition Problem: Moderate Malnutrition Etiology: chronic illness (chronic hyponatremia associated with lightheadedness and vomiting) Signs/Symptoms: moderate fat depletion, moderate muscle depletion, severe muscle depletion Interventions: TPN  DVT prophylaxis: enoxaparin (LOVENOX) injection 30 mg Start: 09/05/20 1400     Code Status: Full Code Family Communication: None at bedside. I discussed with patient's brother in detail, updated care and answered questions. Disposition:  Status is: Inpatient  Remains inpatient appropriate because:Inpatient level of care appropriate due to severity of illness   Dispo: The patient is from: Home              Anticipated d/c is to: TBD              Anticipated d/c date is: > 3 days              Patient currently is not medically stable to d/c.        Consultants:     Procedures:   Oral ET tube 10/17 > 10/23 RUE PICC line 10/23-discontinued  10/26 New LUE PICC line placed 10/26.  TPN started 10/22.  Antimicrobials:    Micro data: COVID 10/17 >> negative  Influenza A/B 10/17 >> negative MRSA PCR 10/18 >> negative  BCx 2 10/17 >> negative  UC 10/17 >> 30k proteus >> S-cefazolin, ceftriaxone.    Anti-infectives (From admission, onward)   Start     Dose/Rate Route Frequency Ordered Stop   08/28/20 1930  cefTRIAXone (ROCEPHIN) 2 g in sodium chloride 0.9 % 100 mL IVPB        2 g 200 mL/hr over 30 Minutes Intravenous Every 24 hours 08/28/20 1927 09/03/20 2021   08/28/20 1930  azithromycin (ZITHROMAX) 500 mg in sodium chloride 0.9 % 250 mL IVPB        500 mg 250 mL/hr over 60 Minutes Intravenous Every 24 hours 08/28/20 1927 09/03/20 2143        Subjective:  Patient recently transferred from ICU to floor early this morning.  I was called to bedside early this morning by rapid response RN due to respiratory distress and cough.  Right away went to bedside, patient stated that her breathing was "okay" denied dyspnea but had trouble completing sentences.  Obviously tachypneic.  Reassessed her after several hours, PCCM has already seen, PICC team at  bedside changing PICC lines, again reports no dyspnea but looks better, still mildly tachypneic but able to speak in full sentences.  Objective:   Vitals:   09/06/20 0633 09/06/20 0929 09/06/20 1039 09/06/20 1044  BP: (!) 147/95 (!) 147/96 135/90 (!) 145/93  Pulse: (!) 43 (!) 102 (!) 103 (!) 103  Resp: (!) 30 (!) 33 (!) 36 (!) 34  Temp: 97.8 F (36.6 C) 98.1 F (36.7 C) 98 F (36.7 C) 98.1 F (36.7 C)  TempSrc: Oral  Oral Oral  SpO2: 95% 100% 100% 95%  Weight:      Height:        General exam: Middle-age female, small built, frail and chronically ill looking lying propped up in bed with tachypnea Respiratory system: Distant breath sounds, occasional anterior rhonchi, no stridor, diminished breath sounds in the bases with occasional basal crackles.  Mild  tachypnea. Cardiovascular system: S1 & S2 heard, regular tachycardia. No JVD, murmurs, rubs, gallops or clicks. No pedal edema. Gastrointestinal system: Abdomen is nondistended, soft and nontender. No organomegaly or masses felt. Normal bowel sounds heard. Central nervous system: Alert and oriented x2. No focal neurological deficits. Extremities: Symmetric 5 x 5 power.  Dusky and cool toes bilaterally.  Dorsalis pedis bilateral felt.  Has extensive weblike finding on skin of left lower extremity/thigh?  Etiology. Skin: No rashes, lesions or ulcers Psychiatry: Judgement and insight appear impaired. Mood & affect appropriate.     Data Reviewed:   I have personally reviewed following labs and imaging studies   CBC: Recent Labs  Lab 09/03/20 0500 09/05/20 0430 09/05/20 0547  WBC 8.4 13.8* 15.4*  NEUTROABS 6.4 11.2*  --   HGB 9.1* 9.2* 9.7*  HCT 27.7* 29.1* 30.5*  MCV 90.2 97.7 94.4  PLT 186 255 914    Basic Metabolic Panel: Recent Labs  Lab 09/05/20 0430 09/05/20 0547 09/06/20 0500  NA 135 142 141  K 6.8* 4.0 4.3  CL 104 107 106  CO2 27 29 30   GLUCOSE 797* 137* 129*  BUN 21 23 24*  CREATININE 0.61 0.51 0.46  CALCIUM 7.9* 8.1* 8.1*  MG 2.3 1.9 2.0  PHOS 5.0* 2.3* 3.2    Liver Function Tests: Recent Labs  Lab 09/03/20 0500 09/05/20 0430 09/05/20 0547  AST 34 39 45*  ALT 29 32 40  ALKPHOS 90 140* 156*  BILITOT 0.4 <0.1* 0.3  PROT 3.9* 4.2* 4.8*  ALBUMIN 1.9* 2.0* 2.2*    CBG: Recent Labs  Lab 09/06/20 0439 09/06/20 0815 09/06/20 1231  GLUCAP 139* 98 109*    Microbiology Studies:   Recent Results (from the past 240 hour(s))  Respiratory Panel by RT PCR (Flu A&B, Covid) - Nasopharyngeal Swab     Status: None   Collection Time: 08/28/20  7:25 PM   Specimen: Nasopharyngeal Swab  Result Value Ref Range Status   SARS Coronavirus 2 by RT PCR NEGATIVE NEGATIVE Final    Comment: (NOTE) SARS-CoV-2 target nucleic acids are NOT DETECTED.  The SARS-CoV-2  RNA is generally detectable in upper respiratoy specimens during the acute phase of infection. The lowest concentration of SARS-CoV-2 viral copies this assay can detect is 131 copies/mL. A negative result does not preclude SARS-Cov-2 infection and should not be used as the sole basis for treatment or other patient management decisions. A negative result may occur with  improper specimen collection/handling, submission of specimen other than nasopharyngeal swab, presence of viral mutation(s) within the areas targeted by this assay, and inadequate number of viral  copies (<131 copies/mL). A negative result must be combined with clinical observations, patient history, and epidemiological information. The expected result is Negative.  Fact Sheet for Patients:  PinkCheek.be  Fact Sheet for Healthcare Providers:  GravelBags.it  This test is no t yet approved or cleared by the [place] FDA and  has been authorized for detection and/or diagnosis of SARS-CoV-2 by FDA under an Emergency Use Authorization (EUA). This EUA will remain  in effect (meaning this test can be used) for the duration of the COVID-19 declaration under Section 564(b)(1) of the Act, 21 U.S.C. section 360bbb-3(b)(1), unless the authorization is terminated or revoked sooner.     Influenza A by PCR NEGATIVE NEGATIVE Final   Influenza B by PCR NEGATIVE NEGATIVE Final    Comment: (NOTE) The Xpert Xpress SARS-CoV-2/FLU/RSV assay is intended as an aid in  the diagnosis of influenza from Nasopharyngeal swab specimens and  should not be used as a sole basis for treatment. Nasal washings and  aspirates are unacceptable for Xpert Xpress SARS-CoV-2/FLU/RSV  testing.  Fact Sheet for Patients: PinkCheek.be  Fact Sheet for Healthcare Providers: GravelBags.it  This test is not yet approved or cleared by the  [place] FDA and  has been authorized for detection and/or diagnosis of SARS-CoV-2 by  FDA under an Emergency Use Authorization (EUA). This EUA will remain  in effect (meaning this test can be used) for the duration of the  Covid-19 declaration under Section 564(b)(1) of the Act, 21  U.S.C. section 360bbb-3(b)(1), unless the authorization is  terminated or revoked. Performed at [facility], [place] [address], [place], [place] [address]   Blood Culture (routine x 2)     Status: None   Collection Time: 08/28/20  7:50 PM   Specimen: BLOOD  Result Value Ref Range Status   Specimen Description BLOOD SITE NOT SPECIFIED  Final   Special Requests   Final    BOTTLES DRAWN AEROBIC AND ANAEROBIC Blood Culture results may not be optimal due to an inadequate volume of blood received in culture bottles   Culture   Final    NO GROWTH 5 DAYS Performed at [facility], [place] [address] [place], [place] [address]    Report Status 09/02/2020 FINAL  Final  Blood Culture (routine x 2)     Status: None   Collection Time: 08/28/20  7:51 PM   Specimen: BLOOD LEFT HAND  Result Value Ref Range Status   Specimen Description BLOOD LEFT HAND  Final   Special Requests   Final    BOTTLES DRAWN AEROBIC ONLY Blood Culture results may not be optimal due to an inadequate volume of blood received in culture bottles   Culture   Final    NO GROWTH 5 DAYS Performed at [facility], [place] [address] [place], [place] [address]    Report Status 09/02/2020 FINAL  Final  Urine culture     Status: Abnormal   Collection Time: 08/28/20  8:24 PM   Specimen: Urine, Random  Result Value Ref Range Status   Specimen Description URINE, RANDOM  Final   Special Requests   Final    NONE Performed at [facility], [place] [address] [place], [place] [address]    Culture 30,000 COLONIES/mL PROTEUS MIRABILIS (A)  Final   Report Status 08/31/2020 FINAL  Final   Organism ID, Bacteria PROTEUS MIRABILIS (A)   Final      Susceptibility   Proteus mirabilis - MIC*  AMPICILLIN <=2 SENSITIVE Sensitive     CEFAZOLIN <=4 SENSITIVE Sensitive     CEFTRIAXONE <=0.25 SENSITIVE Sensitive     CIPROFLOXACIN <=0.25 SENSITIVE Sensitive     GENTAMICIN <=1 SENSITIVE Sensitive     IMIPENEM 8 INTERMEDIATE Intermediate     NITROFURANTOIN 128 RESISTANT Resistant     TRIMETH/SULFA <=20 SENSITIVE Sensitive     AMPICILLIN/SULBACTAM <=2 SENSITIVE Sensitive     PIP/TAZO <=4 SENSITIVE Sensitive     * 30,000 COLONIES/mL PROTEUS MIRABILIS  MRSA PCR Screening     Status: None   Collection Time: 08/29/20 11:16 AM   Specimen: Nasal Mucosa; Nasopharyngeal  Result Value Ref Range Status   MRSA by PCR NEGATIVE NEGATIVE Final    Comment:        The GeneXpert MRSA Assay (FDA approved for NASAL specimens only), is one component of a comprehensive MRSA colonization surveillance program. It is not intended to diagnose MRSA infection nor to guide or monitor treatment for MRSA infections. Performed at [facility], [place] [address], [place], [place] [address]      Radiology Studies:  DG Chest 1 View  Result Date: 09/06/2020 CLINICAL DATA:  Decreased oxygen saturation and patient responsiveness. EXAM: CHEST  1 VIEW COMPARISON:  09/04/2020 FINDINGS: Stable right arm PICC line. There is considerable rotation artifact. Moderate to large bilateral pleural effusions are unchanged. Mild interstitial edema identified. IMPRESSION: 1. Bilateral pleural effusions and mild interstitial edema, unchanged. Electronically Signed   By: Kerby Moors M.D.   On: 09/06/2020 08:32   DG Swallowing Func-Speech Pathology  Result Date: 09/05/2020 Objective Swallowing Evaluation: Type of Study: MBS-Modified Barium Swallow Study  Patient Details Name: Yanin Muhlestein MRN: [id number] Date of Birth: 29-Apr-1951 Today's Date: 09/05/2020 Time: SLP Start Time (ACUTE ONLY): 1205 -SLP Stop Time (ACUTE ONLY): 1230 SLP Time Calculation  (min) (ACUTE ONLY): 25 min Past Medical History: Past Medical History: Diagnosis Date . Abdominal pain, epigastric  . Acid reflux   Pt was born with acute acid . Anxiety  . GERD (gastroesophageal reflux disease)  . Heart murmur  . Hiatal hernia  . Hypertension  . Hyponatremia 02/10/2020 . Schatzki's ring  . Ulcer   8 years ago Past Surgical History: Past Surgical History: Procedure Laterality Date . ABDOMINAL HYSTERECTOMY    complete for fibroids . KNEE ARTHROSCOPY    right knee . shoulder repalcement    right . TONSILLECTOMY AND ADENOIDECTOMY   HPI: 69 yo F with a history of hiatal hernia, schatski ring, hyponatremia who was found down at home by her brother after she was last known normal yesterday.  When EMS arrived, her o2 sats were in the 68s and had difficulty breathing.  She was thus placed on NRB, and when assessed in the ED, decision was made to intubate.   CXR showed right sided atelectasis and likely infiltrate 09/04/20 CXR indicated: Continued hazy opacity at the bases where there was atelectasis and .airspace disease on recent chest CT. There is also bilateral pleural  Subjective: Pt seen in radiology for MBS. States she is "very weak" today Assessment / Plan / Recommendation CHL IP CLINICAL IMPRESSIONS 09/05/2020 Clinical Impression Pt exhibits normal oral and pharyngeal swallow function. No oral residue or anterior leakage, and no penetration/aspiration or post-swallow residue seen. Esophageal sweep revealed stasis throughout the upper-mid esophagus several minutes after PO trials had ended. Recommend dys 3 diet with solids cut into small pieces (for energy conservation), thin liquids, meds one at a time. Pt was given  written behavioral and dietary strategies for management of esophageal dysmotility. SLP will follow to assess diet tolerance and continue education.  SLP Visit Diagnosis Dysphagia, pharyngoesophageal phase (R13.14)     Impact on safety and function Mild - Moderate aspiration risk Risk  for inadequate nutrition/hydration     Prognosis 09/05/2020 Prognosis for Safe Diet Advancement Fair Barriers to Reach Goals Severity of deficits Barriers/Prognosis Comment Significant esophageal dysmotility observed CHL IP DIET RECOMMENDATION 09/05/2020 SLP Diet Recommendations Dysphagia 3 (Mech soft) solids;Thin liquid Liquid Administration via Cup;Straw Medication Administration Whole meds with liquid Compensations Minimize environmental distractions Slow rate Small sips/bites Postural Changes Seated upright at 90 degrees Remain semi-upright after after meals    CHL IP OTHER RECOMMENDATIONS 09/05/2020   Oral Care Recommendations Oral care BID            CHL IP ORAL PHASE 09/05/2020 Oral Phase WFL    CHL IP PHARYNGEAL PHASE 09/05/2020 Pharyngeal Phase WFL    CHL IP CERVICAL ESOPHAGEAL PHASE 09/05/2020 Cervical Esophageal Phase Impaired   Celia B. Quentin Ore, [facility], [place] Speech Language Pathologist Office: [phone] Pager: [phone] Shonna Chock 09/05/2020, 12:52 PM              [place] EKG SITE RITE  Result Date: 09/06/2020 If Site Rite image not attached, placement could not be confirmed due to current cardiac rhythm.    Scheduled Meds:   . arformoterol  15 mcg Nebulization BID  . budesonide (PULMICORT) nebulizer solution  0.25 mg Nebulization BID  . chlorhexidine  15 mL Mouth Rinse BID  . Chlorhexidine Gluconate Cloth  6 each Topical Daily  . enoxaparin (LOVENOX) injection  30 mg Subcutaneous Q24H  . feeding supplement (KATE FARMS STANDARD 1.4)  325 mL Oral BID BM  . insulin aspart  0-9 Units Subcutaneous Q8H  . mouth rinse  15 mL Mouth Rinse q12n4p  . metoprolol tartrate  5 mg Intravenous Q6H  . nicotine  21 mg Transdermal Daily  . sodium chloride flush  10-40 mL Intracatheter Q12H    Continuous Infusions:   . TPN ADULT (ION) 55 mL/hr at 09/06/20 0600  . TPN ADULT (ION)       LOS: 9 days     Vernell Leep, MD, [place], [facility]. [facility]    To contact the attending  provider between 7A-7P or the covering provider during after hours 7P-7A, please log into the web site [email or web address] and access using universal [place] password for that web site. If you do not have the password, please call the hospital operator.  09/06/2020, 2:18 PM

## 2020-09-06 NOTE — Progress Notes (Addendum)
  Speech Language Pathology Treatment: Dysphagia  Patient Details Name: [name] MRN: [id number] DOB: 1951/07/27 Today's Date: 09/06/2020 Time: 1245-1300 SLP Time Calculation (min) (ACUTE ONLY): 15 min  Assessment / Plan / Recommendation Clinical Impression  Pt seen at bedside for follow up after MBS completed Monday 08/26/20. Pt has transferred from ICU, and reports tolerance of PO intake. She indicates that she feels better/stronger today, and would like to have more food choices than Dys2 offers.  Pt was observed drinking thin liquids via straw, and did not exhibit overt s/s aspiration. MBS results indicated normal oropharyngeal swallow, with significant esophageal stasis. SLP provided written information regarding management of esophageal dysmotility, and reviewed this information with her:  Behavioral and Dietary Strategies for Management of Esophageal Dysmotility 1. Take reflux medications 30+ minutes before other po intake, in the morning 2. Begin meals with warm beverage 3. Eat smaller more frequent meals 4. Eat slowly, taking small bites and sips 5. Alternate solids and liquids 6. Avoid foods/liquids that increase acid production 7. Sit upright during and for 30+ minutes after meals to facilitate esophageal clearing  Pt may benefit from esophageal work up.    HPI HPI: 69 yo F with a history of hiatal hernia, schatski ring, hyponatremia who was found down at home by her brother after she was last known normal yesterday.  When EMS arrived, her o2 sats were in the 35s and had difficulty breathing.  She was thus placed on NRB, and when assessed in the ED, decision was made to intubate.   CXR showed right sided atelectasis and likely infiltrate 09/04/20 CXR indicated: Continued hazy opacity at the bases where there was atelectasis and .airspace disease on recent chest CT. There are also bilateral pleural effusions      SLP Plan  Continue with current plan of care        Recommendations  Diet recommendations: Regular;Thin liquid Liquids provided via: Cup;Straw Medication Administration: Whole meds with liquid Supervision: Patient able to self feed;Staff to assist with self feeding;Intermittent supervision to cue for compensatory strategies Compensations: Minimize environmental distractions;Slow rate;Small sips/bites;Follow solids with liquid Postural Changes and/or Swallow Maneuvers: Seated upright 90 degrees;Upright 30-60 min after meal                Oral Care Recommendations: Oral care BID Follow up Recommendations: None Plan: Continue with current plan of care       Chelyan B. Quentin Ore, [facility], [place] Speech Language Pathologist Office: [phone] Pager: [phone]  Shonna Chock 09/06/2020, 1:08 PM

## 2020-09-06 NOTE — Progress Notes (Signed)
Patient in yellow mews at shift change

## 2020-09-06 NOTE — Progress Notes (Signed)
Pt has arrived from ICU as a transfer in the [facility]

## 2020-09-06 NOTE — Significant Event (Signed)
Rapid Response Event Note   Reason for Call :  Respiratory distress  Initial Focused Assessment:  Patient is breathing 40-55 times a minute, alert and oriented, sitting up in bed. States that she does not feel any worse. Rhonchi heard upon assessment.  When asked how her breathing feels she states "fine". Asked patient to take a big, deep breath. Patient was unable to do so, and continued shallow breaths. MD notified for input and instructed nursing staff to administer Pulmicort breathing treatment.    Interventions:  Chest xray and ABG ordered. Nebulizer administered.   Plan of Care:  MD rounded and assessed patient. States that he does not hear rhonchi at all and that he has a concern for tachypnea without adventitious breath sounds. MD called critical care medicine to take a look at the patient. Transfer orders pending based on ABG, chest xray, critical care assessment.    Event Summary:   MD Notified: 0715 Call Time: 0711 Arrival Time: 3582 End Time: 5189  Clarene Critchley, RN

## 2020-09-06 NOTE — Progress Notes (Signed)
   09/06/20 1740  Assess: MEWS Score  Temp 98.7 F (37.1 C)  BP 134/77  Pulse Rate (!) 102  Resp (!) 30  Level of Consciousness Alert  SpO2 100 %  O2 Device Nasal Cannula  O2 Flow Rate (L/min) 4 L/min  Assess: MEWS Score  MEWS Temp 0  MEWS Systolic 0  MEWS Pulse 1  MEWS RR 2  MEWS LOC 0  MEWS Score 3  MEWS Score Color Yellow  Assess: if the MEWS score is Yellow or Red  Were vital signs taken at a resting state? Yes  Focused Assessment No change from prior assessment  Early Detection of Sepsis Score *See Row Information* Low  MEWS guidelines implemented *See Row Information* Yes  Treat  MEWS Interventions Administered scheduled meds/treatments  Pain Scale 0-10  Pain Score 0  Breathing 1  Negative Vocalization 0  Facial Expression 0  Body Language 0  Consolability 0  PAINAD Score 1  Take Vital Signs  Increase Vital Sign Frequency  Yellow: Q 2hr X 2 then Q 4hr X 2, if remains yellow, continue Q 4hrs  Escalate  MEWS: Escalate Yellow: discuss with charge nurse/RN and consider discussing with provider and RRT  Notify: Charge Nurse/RN  Name of Charge Nurse/RN Notified bableen  Date Charge Nurse/RN Notified 09/06/20  Time Charge Nurse/RN Notified 1747  Document  Patient Outcome Other (Comment) (MD states chronic tachypnea)     No change since prior assessment. Respirations still in 30's-40's. MD states this is Chronic for this patient. Will continue to monitor closely. 100% on 4liters [place]. No complaints or signs of distress from patient.Call bell in reach.

## 2020-09-06 NOTE — Progress Notes (Signed)
   09/06/20 0929  Assess: MEWS Score  Temp 98.1 F (36.7 C)  BP (!) 147/96  Pulse Rate (!) 102  Resp (!) 33  Level of Consciousness Alert  SpO2 100 %  O2 Device Nasal Cannula  O2 Flow Rate (L/min) 4 L/min  Assess: MEWS Score  MEWS Temp 0  MEWS Systolic 0  MEWS Pulse 1  MEWS RR 2  MEWS LOC 0  MEWS Score 3  MEWS Score Color Yellow  Assess: if the MEWS score is Yellow or Red  Were vital signs taken at a resting state? Yes  Focused Assessment No change from prior assessment  Early Detection of Sepsis Score *See Row Information* Medium  MEWS guidelines implemented *See Row Information* Yes  Treat  Pain Scale 0-10  Pain Score 0  Take Vital Signs  Increase Vital Sign Frequency  Yellow: Q 2hr X 2 then Q 4hr X 2, if remains yellow, continue Q 4hrs  Escalate  MEWS: Escalate Yellow: discuss with charge nurse/RN and consider discussing with provider and RRT  Notify: Charge Nurse/RN  Name of Charge Nurse/RN Notified [place]  Date Charge Nurse/RN Notified 09/06/20  Time Charge Nurse/RN Notified 0940  Document  Patient Outcome Not stable and remains on department      Critical Care assessed patient and patient status is still the same. No complaints voiced with patient. Resting in bed at this time. Waiting for new Picc to be placed. Will continue to monitor.

## 2020-09-06 NOTE — Progress Notes (Signed)
Peripherally Inserted Central Catheter Placement  The IV Nurse has discussed with the patient and/or persons authorized to consent for the patient, the purpose of this procedure and the potential benefits and risks involved with this procedure.  The benefits include less needle sticks, lab draws from the catheter, and the patient may be discharged home with the catheter. Risks include, but not limited to, infection, bleeding, blood clot (thrombus formation), and puncture of an artery; nerve damage and irregular heartbeat and possibility to perform a PICC exchange if needed/ordered by physician.  Alternatives to this procedure were also discussed.  Bard Power PICC patient education guide, fact sheet on infection prevention and patient information card has been provided to patient /or left at bedside.    PICC Placement Documentation  PICC Double Lumen 09/02/20 PICC Right Brachial 31 cm 0 cm (Active)  Indication for Insertion or Continuance of Line Administration of hyperosmolar/irritating solutions (i.e. TPN, Vancomycin, etc.) 09/06/20 0800  Exposed Catheter (cm) 0 cm 09/06/20 0800  Site Assessment Edematous;Leaking 09/06/20 0800  Lumen #1 Status Infusing 09/06/20 0800  Lumen #2 Status Blood return noted;Flushed 09/06/20 0600  Dressing Type Transparent 09/06/20 0800  Dressing Status Intact 09/06/20 0800  Antimicrobial disc in place? Yes 09/06/20 0800  Safety Lock Not Applicable 62/70/35 0093  Line Care Lumen 2 tubing changed;Connections checked and tightened 09/05/20 2000  Line Adjustment (NICU/IV Team Only) No 09/04/20 0800  Dressing Intervention New dressing;Antimicrobial disc changed;Securement device changed 09/06/20 0600  Dressing Change Due 09/12/20 09/06/20 0600     PICC Double Lumen 81/82/99 PICC Left Basilic 37 cm 0 cm (Active)  Exposed Catheter (cm) 0 cm 09/06/20 1223  Site Assessment Clean;Dry;Intact 09/06/20 1223  Lumen #1 Status Blood return noted;Saline locked 09/06/20 1223   Lumen #2 Status Blood return noted;Saline locked 09/06/20 1223  Dressing Type Transparent;Securing device 09/06/20 1223  Antimicrobial disc in place? Yes 09/06/20 1223  Safety Lock Not Applicable 37/16/96 7893  Dressing Change Due 09/13/20 09/06/20 1223       Frances Maywood 09/06/2020, 12:25 PM

## 2020-09-06 NOTE — Care Management Important Message (Addendum)
Important Message  Patient Details IM Letter put in door caddy to be given to the Patient Name: [name] MRN: [id number] Date of Birth: 1950/12/03   Medicare Important Message Given:  Yes     Kerin Salen 09/06/2020, 12:03 PM

## 2020-09-06 NOTE — Progress Notes (Signed)
Rapid response nurse and Dr Algis Liming at bedside

## 2020-09-06 NOTE — Progress Notes (Signed)
Asked to evaluate patient for respiratory distress. Transferred from ICU this morning. She is satting 100% on 4LNC and able to speak in full sentences. RR increased but unchanged from yesterday. She ate well yesterday but has a moist cough with rhonchi. Chest xray obtained and is unchanged.  No need for transfer to ICU at this time. IV team at bedside asking for order to replace PICC line due to malpositioning. Recommend Palliative consultation for [place].

## 2020-09-06 NOTE — Progress Notes (Signed)
pulmonolgy at bedside

## 2020-09-06 NOTE — Progress Notes (Signed)
Assessed PICC line dressing was changed at 0600 by RN and it continues to leak. CC MD here - aware that PICC line will need to be removed. MD agrees that pt needs line d/c'd and replaced.

## 2020-09-07 ENCOUNTER — Inpatient Hospital Stay (HOSPITAL_COMMUNITY): Payer: Medicare Other

## 2020-09-07 DIAGNOSIS — J9600 Acute respiratory failure, unspecified whether with hypoxia or hypercapnia: Secondary | ICD-10-CM | POA: Diagnosis not present

## 2020-09-07 DIAGNOSIS — J189 Pneumonia, unspecified organism: Secondary | ICD-10-CM | POA: Diagnosis not present

## 2020-09-07 DIAGNOSIS — Z7189 Other specified counseling: Secondary | ICD-10-CM

## 2020-09-07 DIAGNOSIS — J9620 Acute and chronic respiratory failure, unspecified whether with hypoxia or hypercapnia: Secondary | ICD-10-CM | POA: Diagnosis not present

## 2020-09-07 DIAGNOSIS — K449 Diaphragmatic hernia without obstruction or gangrene: Secondary | ICD-10-CM | POA: Diagnosis not present

## 2020-09-07 DIAGNOSIS — Z515 Encounter for palliative care: Secondary | ICD-10-CM

## 2020-09-07 LAB — CBC WITH DIFFERENTIAL/PLATELET
Abs Immature Granulocytes: 0.27 10*3/uL — ABNORMAL HIGH (ref 0.00–0.07)
Basophils Absolute: 0 10*3/uL (ref 0.0–0.1)
Basophils Relative: 0 %
Eosinophils Absolute: 0.1 10*3/uL (ref 0.0–0.5)
Eosinophils Relative: 1 %
HCT: 27.7 % — ABNORMAL LOW (ref 36.0–46.0)
Hemoglobin: 8.9 g/dL — ABNORMAL LOW (ref 12.0–15.0)
Immature Granulocytes: 1 %
Lymphocytes Relative: 7 %
Lymphs Abs: 1.5 10*3/uL (ref 0.7–4.0)
MCH: 30.4 pg (ref 26.0–34.0)
MCHC: 32.1 g/dL (ref 30.0–36.0)
MCV: 94.5 fL (ref 80.0–100.0)
Monocytes Absolute: 1.2 10*3/uL — ABNORMAL HIGH (ref 0.1–1.0)
Monocytes Relative: 5 %
Neutro Abs: 19.5 10*3/uL — ABNORMAL HIGH (ref 1.7–7.7)
Neutrophils Relative %: 86 %
Platelets: 359 10*3/uL (ref 150–400)
RBC: 2.93 MIL/uL — ABNORMAL LOW (ref 3.87–5.11)
RDW: 15 % (ref 11.5–15.5)
WBC: 22.6 10*3/uL — ABNORMAL HIGH (ref 4.0–10.5)
nRBC: 0 % (ref 0.0–0.2)

## 2020-09-07 LAB — GLUCOSE, CAPILLARY
Glucose-Capillary: 122 mg/dL — ABNORMAL HIGH (ref 70–99)
Glucose-Capillary: 132 mg/dL — ABNORMAL HIGH (ref 70–99)
Glucose-Capillary: 71 mg/dL (ref 70–99)
Glucose-Capillary: 108 mg/dL — ABNORMAL HIGH (ref 70–99)

## 2020-09-07 LAB — CBC
HCT: 28.9 % — ABNORMAL LOW (ref 36.0–46.0)
Hemoglobin: 9.3 g/dL — ABNORMAL LOW (ref 12.0–15.0)
MCH: 30.2 pg (ref 26.0–34.0)
MCHC: 32.2 g/dL (ref 30.0–36.0)
MCV: 93.8 fL (ref 80.0–100.0)
Platelets: 348 10*3/uL (ref 150–400)
RBC: 3.08 MIL/uL — ABNORMAL LOW (ref 3.87–5.11)
RDW: 14.8 % (ref 11.5–15.5)
WBC: 21 10*3/uL — ABNORMAL HIGH (ref 4.0–10.5)
nRBC: 0 % (ref 0.0–0.2)

## 2020-09-07 LAB — BODY FLUID CELL COUNT WITH DIFFERENTIAL
Eos, Fluid: 0 %
Lymphs, Fluid: 22 %
Monocyte-Macrophage-Serous Fluid: 6 % — ABNORMAL LOW (ref 50–90)
Neutrophil Count, Fluid: 72 % — ABNORMAL HIGH (ref 0–25)
Total Nucleated Cell Count, Fluid: 131 cu mm (ref 0–1000)

## 2020-09-07 LAB — LACTATE DEHYDROGENASE, PLEURAL OR PERITONEAL FLUID: LD, Fluid: 51 U/L — ABNORMAL HIGH (ref 3–23)

## 2020-09-07 LAB — COMPREHENSIVE METABOLIC PANEL
ALT: 48 U/L — ABNORMAL HIGH (ref 0–44)
AST: 48 U/L — ABNORMAL HIGH (ref 15–41)
Albumin: 2.2 g/dL — ABNORMAL LOW (ref 3.5–5.0)
Alkaline Phosphatase: 213 U/L — ABNORMAL HIGH (ref 38–126)
Anion gap: 5 (ref 5–15)
BUN: 28 mg/dL — ABNORMAL HIGH (ref 8–23)
CO2: 31 mmol/L (ref 22–32)
Calcium: 8.2 mg/dL — ABNORMAL LOW (ref 8.9–10.3)
Chloride: 104 mmol/L (ref 98–111)
Creatinine, Ser: 0.52 mg/dL (ref 0.44–1.00)
GFR, Estimated: >60 mL/min (ref >60)
Glucose, Bld: 127 mg/dL — ABNORMAL HIGH (ref 70–99)
Potassium: 4.5 mmol/L (ref 3.5–5.1)
Sodium: 140 mmol/L (ref 135–145)
Total Bilirubin: 0.3 mg/dL (ref 0.3–1.2)
Total Protein: 4.9 g/dL — ABNORMAL LOW (ref 6.5–8.1)

## 2020-09-07 LAB — GLUCOSE, PLEURAL OR PERITONEAL FLUID: Glucose, Fluid: 121 mg/dL

## 2020-09-07 LAB — PROCALCITONIN: Procalcitonin: 0.14 ng/mL

## 2020-09-07 LAB — PHOSPHORUS: Phosphorus: 3.3 mg/dL (ref 2.5–4.6)

## 2020-09-07 LAB — BRAIN NATRIURETIC PEPTIDE: B Natriuretic Peptide: 3015.3 pg/mL — ABNORMAL HIGH (ref 0.0–100.0)

## 2020-09-07 LAB — ALBUMIN, PLEURAL OR PERITONEAL FLUID: Albumin, Fluid: <1 g/dL

## 2020-09-07 LAB — TSH: TSH: 1.536 u[IU]/mL (ref 0.350–4.500)

## 2020-09-07 LAB — MAGNESIUM: Magnesium: 2.1 mg/dL (ref 1.7–2.4)

## 2020-09-07 MED ORDER — SODIUM CHLORIDE 0.9 % IN NEBU
3.0000 mL | INHALATION_SOLUTION | Freq: Three times a day (TID) | RESPIRATORY_TRACT | Status: DC
  Administered 2020-09-07 – 2020-09-09 (×5): 3 mL via RESPIRATORY_TRACT
  Filled 2020-09-07 (×6): qty 3

## 2020-09-07 MED ORDER — FUROSEMIDE 10 MG/ML IJ SOLN
20.0000 mg | Freq: Two times a day (BID) | INTRAMUSCULAR | Status: DC
  Administered 2020-09-07: 20 mg via INTRAVENOUS
  Filled 2020-09-07: qty 2

## 2020-09-07 MED ORDER — HYDROMORPHONE HCL 1 MG/ML IJ SOLN
0.2500 mg | INTRAMUSCULAR | Status: DC | PRN
  Administered 2020-09-07 (×2): 0.25 mg via INTRAVENOUS
  Filled 2020-09-07 (×2): qty 0.5

## 2020-09-07 MED ORDER — LIDOCAINE HCL 1 % IJ SOLN
INTRAMUSCULAR | Status: AC
  Filled 2020-09-07: qty 20

## 2020-09-07 MED ORDER — MORPHINE SULFATE (PF) 2 MG/ML IV SOLN
2.0000 mg | INTRAVENOUS | Status: DC | PRN
  Administered 2020-09-07 – 2020-09-14 (×23): 2 mg via INTRAVENOUS
  Filled 2020-09-07 (×23): qty 1

## 2020-09-07 MED ORDER — IPRATROPIUM BROMIDE 0.02 % IN SOLN
0.5000 mg | Freq: Four times a day (QID) | RESPIRATORY_TRACT | Status: DC
  Administered 2020-09-07 – 2020-09-08 (×2): 0.5 mg via RESPIRATORY_TRACT
  Filled 2020-09-07 (×3): qty 2.5

## 2020-09-07 MED ORDER — LEVALBUTEROL HCL 0.63 MG/3ML IN NEBU
0.6300 mg | INHALATION_SOLUTION | Freq: Four times a day (QID) | RESPIRATORY_TRACT | Status: DC
  Administered 2020-09-07 – 2020-09-08 (×2): 0.63 mg via RESPIRATORY_TRACT
  Filled 2020-09-07 (×3): qty 3

## 2020-09-07 MED ORDER — TRAVASOL 10 % IV SOLN
INTRAVENOUS | Status: AC
  Filled 2020-09-07: qty 842.4

## 2020-09-07 NOTE — Progress Notes (Signed)
With AM assessment, Pt alert and oriented x4; able to verbalize needs and follow commands. Dyspneic, tachypneic, labored breathing with accessory muscle usage; RR 46. Pt denies any pain or shortness of breath; able to say a whole sentence before requiring a breath for next sentence. Mottling to all extremities; BLE's >BUEs with cyanotic nailbeds present. She's on 4L via [place] with sat 100%.   MD [place] Crane made aware and assessed pt. New orders received for furosemide and hydromorphone.   Palliative NP also visited with pt after MD [place] Crane and states pt would like to continue with all medical care. (see note).  Currently pt remains A&O x3; continuous 4L/[place]; denies pain and respiratory discomfort. Increase sleep this AM and only took in bites and sips for breakfast.

## 2020-09-07 NOTE — Progress Notes (Signed)
   09/07/20 2011  Assess: MEWS Score  Temp 98.2 F (36.8 C)  BP 140/87  Pulse Rate (!) 101  Resp (!) 41  Assess: MEWS Score  MEWS Temp 0  MEWS Systolic 0  MEWS Pulse 1  MEWS RR 3  MEWS LOC 0  MEWS Score 4  MEWS Score Color Red  Assess: if the MEWS score is Yellow or Red  Were vital signs taken at a resting state? Yes  Focused Assessment Change from prior assessment (see assessment flowsheet)  Early Detection of Sepsis Score *See Row Information* High  MEWS guidelines implemented *See Row Information* Yes  Treat  MEWS Interventions Other (Comment) (notified on call for direction)  Pain Scale 0-10  Pain Score 0  Take Vital Signs  Increase Vital Sign Frequency  Red: Q 1hr X 4 then Q 4hr X 4, if remains red, continue Q 4hrs  Escalate  MEWS: Escalate Red: discuss with charge nurse/RN and provider, consider discussing with RRT  Notify: Charge Nurse/RN  Name of Charge Nurse/RN Notified Tom  Date Charge Nurse/RN Notified 09/07/20  Time Charge Nurse/RN Notified 2014  Notify: Provider  Provider Name/Title Sharlet Salina  Date Provider Notified 09/07/20  Time Provider Notified 2028  Notification Type Page  Notification Reason Change in status  Response See new orders  Date of Provider Response 09/07/20  Time of Provider Response 2035  Notify: Rapid Response  Name of Rapid Response RN Notified Pamala Hurry  Date Rapid Response Notified 09/07/20  Time Rapid Response Notified 2029  Document  Patient Outcome Stabilized after interventions  Progress note created (see row info) Yes

## 2020-09-07 NOTE — Progress Notes (Signed)
  Speech Language Pathology Treatment: Dysphagia  Patient Details Name: [name] MRN: [id number] DOB: 1951/06/29 Today's Date: 09/07/2020 Time: 2244-9753 SLP Time Calculation (min) (ACUTE ONLY): 15 min  Assessment / Plan / Recommendation Clinical Impression  Pt seen for follow up regarding education on esophageal precautions. Pt verbalized awareness of strategies, and tolerance of current diet. Pt MBS indicated normal oral and pharyngeal swallow, with significant esophageal issues, which is baseline for pt. She indicates awareness of this issue and strategies to manage. Goals met. ST will sign off at this time. Please reconsult if needs arise. RN aware.   HPI HPI: 69 yo F with a history of hiatal hernia, schatski ring, hyponatremia who was found down at home by her brother after she was last known normal yesterday.  When EMS arrived, her o2 sats were in the 47s and had difficulty breathing.  She was thus placed on NRB, and when assessed in the ED, decision was made to intubate.   CXR showed right sided atelectasis and likely infiltrate 09/04/20 CXR indicated: Continued hazy opacity at the bases where there was atelectasis and .airspace disease on recent chest CT. There are also bilateral pleural effusions      SLP Plan  Discharge SLP treatment due to goals met       Recommendations  Diet recommendations: Regular;Thin liquid Liquids provided via: Cup;Straw Medication Administration: Whole meds with liquid Supervision: Patient able to self feed;Staff to assist with self feeding;Intermittent supervision to cue for compensatory strategies Compensations: Minimize environmental distractions;Slow rate;Small sips/bites;Follow solids with liquid Postural Changes and/or Swallow Maneuvers: Seated upright 90 degrees;Upright 30-60 min after meal                Oral Care Recommendations: Oral care BID Follow up Recommendations: None SLP Visit Diagnosis: Dysphagia, pharyngoesophageal  phase (R13.14) Plan: Discharge SLP treatment due to (comment)       GO               Jeff Mccallum B. Quentin Ore,  [facility] [place] Speech Language Pathologist Office: [phone] Pager: [phone]  Shonna Chock 09/07/2020, 12:15 PM

## 2020-09-07 NOTE — Consult Note (Addendum)
Consultation Note Date: 09/07/2020   Patient Name: [name]  DOB: [birth year]  MRN: [id number]  Age / Sex: 69 y.o., female  PCP: Nicholes Rough, PA-C Referring Physician: Kayleen Memos, DO  Reason for Consultation: Establishing goals of care  HPI/Patient Profile: 69 y.o. female  with past medical history of hyponatremia of unknown origin presumed to be due to HCTZ which patient continues to take despite being told not to, hyperkalemia, or malnutrition, hiatal hernia with Schatzki's ring-not amenable to surgery admitted on 08/28/2020 from home after being found down by her brother.  When EMS arrived her O2 sats were in the 48s.  She was intubated in the ED, she was extubated on 1023.  Chest x-ray showed atelectasis, likely pulmonary interstitial pulmonary edema, and pneumonia questionable aspiration versus HCAP.  SLP has been consulted and she has been placed on a dysphagia 3 diet with thin liquids.  He is now requiring TPN nutrition.  NG tube was attempted to be placed but unable due to hiatal hernia.  Her respiratory status continues to be tenuous with tachypnea.  Palliative medicine consulted for goals of care.  Clinical Assessment and Goals of Care:  I have reviewed medical records including EPIC notes, labs and imaging, examined the patient and met at bedside with the patient  to discuss diagnosis prognosis, GOC, EOL wishes, disposition and options.  [name] tells me she is frustrated as she has been in the hospital several times this year and does not feel that anyone can figure out what is wrong with her.  As far as functional and nutritional status -prior to admission she was living independently at home, however there appears to be significant decline.  She has a brother who lives in [place] and he checks on her intermittently.  Per report from her RN on her last admission she was much stronger and  has had significant weight loss.  We discussed her current illness and what it means in the larger context of her on-going co-morbidities.    The difference between aggressive medical intervention and comfort care was discussed.   For now [name] wishes to continue with her current medical care and would agree to escalation with a ventilator,  however in the event of a cardiac arrest would not want resuscitation.  She would not want a feeding tube if she is unable to maintain her nutritional status on her own.  I discussed with her that as evidenced by her weight loss I am concerned that her nutritional status is going to continue to decline and she will not be able to sustain herself.  [name] tells me that she does not feel she has a problem with her swallowing and is trying to eat more.  She is interested to going to a nursing facility in the short-term for rehab but would not want to live long-term in a nursing facility.  Questions and concerns were addressed.  The family was encouraged to call with questions or concerns.    Primary Decision Maker PATIENT  SUMMARY OF RECOMMENDATIONS -DNR with the caveat that patient is agreeable to ventilator if needed if her problem is respiratory only. -Continue current scope of care -No feeding tube -[name] is agreeable to SNF placement rehab short-term but but would not want long-term -PMT will continue to follow and monitor for progress and/or decline and readdress goals of care as needed-I am very concerned about patient's status- I worry that she is heading towards end-of-life despite all of our interventions -[name] is aware that she would be eligible for hospice if she chose a more full comfort focused path  Code Status/Advance Care Planning:  DNR  Palliative Prophylaxis:   Aspiration and Eye Care  Additional Recommendations (Limitations, Scope, Preferences):  Full Scope Treatment  Prognosis:    Unable to determine  Discharge  Planning: To Be Determined  Primary Diagnoses: Present on Admission: . Acute and chr resp failure, unsp w hypoxia or hypercapnia ([place]) . Hyponatremia   I have reviewed the medical record, interviewed the patient and family, and examined the patient. The following aspects are pertinent.  Past Medical History:  Diagnosis Date  . Abdominal pain, epigastric   . Acid reflux    Pt was born with acute acid  . Anxiety   . GERD (gastroesophageal reflux disease)   . Heart murmur   . Hiatal hernia   . Hypertension   . Hyponatremia 02/10/2020  . Schatzki's ring   . Ulcer    8 years ago   Social History   Socioeconomic History  . Marital status: Single    Spouse name: Not on file  . Number of children: Not on file  . Years of education: Not on file  . Highest education level: Not on file  Occupational History  . Not on file  Tobacco Use  . Smoking status: Current Every Day Smoker    Packs/day: 0.50    Types: Cigarettes  . Smokeless tobacco: Never Used  . Tobacco comment: almost a pack a day;   Vaping Use  . Vaping Use: Never used  Substance and Sexual Activity  . Alcohol use: No    Alcohol/week: 0.0 standard drinks  . Drug use: No  . Sexual activity: Not on file  Other Topics Concern  . Not on file  Social History Narrative   She does have BS degree from [employer]. 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 She is very  Much afraid of losing her job and is very concerned about this. She has supportive friends and ex- sponsors   Social Determinants of Health   Financial Resource Strain:   . Difficulty of Paying Living Expenses: Not on file  Food Insecurity:   . Worried About Charity fundraiser in the Last Year: Not on file  . Ran Out of Food in the Last Year: Not on file  Transportation Needs:   . Lack of Transportation (Medical): Not on file  . Lack of Transportation (Non-Medical): Not on file  Physical Activity:   . Days of Exercise per  Week: Not on file  . Minutes of Exercise per Session: Not on file  Stress:   . Feeling of Stress : Not on file  Social Connections:   . Frequency of Communication with Friends and Family: Not on file  . Frequency of Social Gatherings with Friends and Family: Not on file  . Attends Religious Services: Not on file  . Active Member of Clubs or Organizations: Not on file  . Attends  Club or Organization Meetings: Not on file  . Marital Status: Not on file   Scheduled Meds: . arformoterol  15 mcg Nebulization BID  . budesonide (PULMICORT) nebulizer solution  0.25 mg Nebulization BID  . chlorhexidine  15 mL Mouth Rinse BID  . Chlorhexidine Gluconate Cloth  6 each Topical Daily  . enoxaparin (LOVENOX) injection  30 mg Subcutaneous Q24H  . feeding supplement (K[employer] STANDARD 1.4)  325 mL Oral BID BM  . furosemide  20 mg Intravenous BID  . insulin aspart  0-9 Units Subcutaneous TID WC  . mouth rinse  15 mL Mouth Rinse q12n4p  . metoprolol tartrate  25 mg Oral BID  . nicotine  21 mg Transdermal Daily   Continuous Infusions: . TPN ADULT (ION) 65 mL/hr at 09/06/20 1745  . TPN ADULT (ION)     PRN Meds:.acetaminophen, docusate, HYDROmorphone (DILAUDID) injection, levalbuterol, metoprolol tartrate, polyethylene glycol, sodium chloride flush Medications Prior to Admission:  Prior to Admission medications   Medication Sig Start Date End Date Taking? Authorizing Provider  baclofen (LIORESAL) 10 MG tablet Take 10 mg by mouth daily as needed for muscle spasms.    Yes [provider]  bisoprolol (ZEBETA) 5 MG tablet Take 1 tablet (5 mg total) by mouth daily. 05/21/20 08/28/20 Yes Antonieta Pert, MD  traMADol (ULTRAM) 50 MG tablet Take 50 mg by mouth every 6 (six) hours as needed for moderate pain.  08/22/20  Yes [provider]  traZODone (DESYREL) 50 MG tablet Take 50 mg by mouth at bedtime as needed for sleep. 08/22/20  Yes [provider]  amLODipine (NORVASC) 10 MG tablet  Take 1 tablet (10 mg total) by mouth daily. 05/20/20 08/28/20  Antonieta Pert, MD   Allergies  Allergen Reactions  . Iodine Shortness Of Breath and Rash  . Thiazide-Type Diuretics Other (See Comments)    Severe hyponatremia July 2021  . Shellfish Allergy     Causes rash   Review of Systems  Constitutional: Positive for activity change, appetite change, fatigue and unexpected weight change.  Respiratory: Positive for shortness of breath.   Hematological: Bruises/bleeds easily.    Physical Exam Vitals and nursing note reviewed.  Cardiovascular:     Rate and Rhythm: Normal rate and regular rhythm.  Pulmonary:     Comments: Increased effort Abdominal:     General: Abdomen is flat.  Neurological:     Mental Status: She is oriented to person, place, and time.     Motor: Weakness present.     Vital Signs: BP (!) 129/94 (BP Location: Right Wrist)   Pulse 85   Temp 98.1 F (36.7 C) (Oral)   Resp (!) 45   Ht '5\' 2"'  (1.575 m)   Wt 49.8 kg   SpO2 100%   BMI 20.08 kg/m  Pain Scale: 0-10 POSS *See Group Information*: 1-Acceptable,Awake and alert Pain Score: 0-No pain   SpO2: SpO2: 100 % O2 Device:SpO2: 100 % O2 Flow Rate: .O2 Flow Rate (L/min): 3.5 L/min  IO: Intake/output summary:   Intake/Output Summary (Last 24 hours) at 09/07/2020 1139 Last data filed at 09/07/2020 6568 Gross per 24 hour  Intake 1252.04 ml  Output 1500 ml  Net -247.96 ml    LBM: Last BM Date: 09/05/20 Baseline Weight: Weight: 36.3 kg Most recent weight: Weight: 49.8 kg     Palliative Assessment/Data: PPS: 30%     Thank you for this consult. Palliative medicine will continue to follow and assist as needed.   Time  In: 1042 Time Out: 1159 Time Total: 77 mins Greater than 50%  of this time was spent counseling and coordinating care related to the above assessment and plan.  Signed by: Mariana Kaufman, AGNP-C Palliative Medicine    Please contact Palliative Medicine Team phone at [phone] for  questions and concerns.  For individual provider: See Shea Evans

## 2020-09-07 NOTE — Progress Notes (Signed)
PROGRESS NOTE  [name] CHY:[id number] DOB: 1951/02/28 DOA: 08/28/2020 PCP: Nicholes Rough, PA-C  HPI/Recap of past 24 hours: PCCM to [facility] transfer 09/06/2020: 69 year old female with history of hiatal hernia, Schatzki's ring, hyponatremia, tobacco abuse, suspected SIADH, who was found down at home by her brother after she was last known normal per day PTA.  When EMS arrived, she was hypoxic in the sixties and had difficulty breathing.  She was thus placed on NRB, and when assessed in the ED, decision was made to intubate.  She was admitted to ICU for acute respiratory failure with hypoxia due to suspected aspiration pneumonia versus CAP, acute metabolic encephalopathy and acute kidney injury.  Extubated 10/23 and transferred to telemetry 10/26.  Ongoing tachypnea, PCCM reevaluated 10/26. Patient lives alone, manages her own meds. She has had substantial weight loss over the last 1 year.  09/07/20: Moderate use of accessory muscles to breathe.  B/L rales and productive cough.  A&Ox3.  Reviewed CXR done on 09/06/20, B/L pleural effusions R>L with mild interstitial edema.  Started IV lasix 20 mg BID.  Obtaining BNP and procalcitonin.  IR consulted for possible R thoracentesis.  Assessment/Plan: Active Problems:   Hyponatremia   Acute and chr resp failure, unsp w hypoxia or hypercapnia (HCC)   Malnutrition of moderate degree   Hiatal hernia   Acute respiratory failure (HCC)   Palliative care by specialist   Goals of care, counseling/discussion   Advanced care planning/counseling discussion  Acute hypoxic hypercarbic respiratory failure likely multifactorial secondary to bilateral pleural effusion, mild pulmonary edema, suspected aspiration versus treated community-acquired pneumonia She presented from home severely hypoxic, was intubated on presentation 08/28/2020. Not on oxygen supplementation at baseline currently on 3-4 L Last ABG done on 09/06/2020 on 3 L nasal cannula showed pH  7.356/54/132 Avoid narcotics Continuous pulse oximetry Maintain O2 saturation greater than 90%  Bilateral pleural effusions right greater than left, mild pulmonary edema, rule out cardiac etiology Obtain BNP and follow results If BNP elevated obtain 2D echo Started gentle IV diuresing, IV Lasix 20 mg twice daily Start incentive spirometer and flutter valve  Leukocytosis, rule out active infective process Repeat CBC with differentials Obtain procalcitonin and sputum culture Monitor WBC and fever curve  Elevated liver chemistries Trend alkaline phosphatase, AST and ALT Avoid hepatotoxic agents  Essential hypertension BP stable Continue home regimen  Severe protein calorie malnutrition BMI 20 Albumin 2.2 Severe muscle mass loss Encourage increase in oral protein calorie intake Oral supplement  Dysphagia Seen by speech therapist with recommendation for regular consistency diet and thin liquid Continue aspiration precaution  Goals of care Palliative care team following and assisting with establishing goals of care Currently she is DNR PMT:DNR with the caveat that patient is agreeable to ventilator if needed if her problem is respiratory only. -Continue current scope of care -No feeding tube -[name] is agreeable to SNF placement rehab short-term but but would not want long-term.     Code Status: DNR  Family Communication: None at bedside  Disposition Plan: Likely SNF   Consultants:  PCCM  Palliative care medicine  Procedures: Intubation Extubation  Antimicrobials:  None.  DVT prophylaxis: Subcu Lovenox daily  Status is: Inpatient    Dispo: The patient is from: Home.              Anticipated d/c is to: SNF.               Anticipated d/c date is: 09/10/2020.  Patient currently not stable for discharge due to ongoing evaluation of acute hypoxic hypercarbic respiratory failure.        Objective: Vitals:   09/07/20 0553 09/07/20  0751 09/07/20 0947 09/07/20 1354  BP: (!) 152/106 (!) 148/111 (!) 129/94 (!) 142/90  Pulse: (!) 102 (!) 108 85 88  Resp: (!) 35 (!) 34 (!) 45 (!) 40  Temp: 98 F (36.7 C) 98.5 F (36.9 C) 98.1 F (36.7 C) 98 F (36.7 C)  TempSrc: Oral Oral Oral Oral  SpO2: 93% 100% 100% 92%  Weight: 49.8 kg     Height:        Intake/Output Summary (Last 24 hours) at 09/07/2020 1417 Last data filed at 09/07/2020 5993 Gross per 24 hour  Intake 1252.04 ml  Output 1500 ml  Net -247.96 ml   Filed Weights   09/05/20 0449 09/06/20 0441 09/07/20 0553  Weight: 46 kg 45.7 kg 49.8 kg    Exam:   General: 69 y.o. year-old female well developed well nourished in no acute distress.  Alert and oriented x3.  Cardiovascular: Regular rate and rhythm with no rubs or gallops.  No thyromegaly or JVD noted.    Respiratory: Clear to auscultation with no wheezes or rales. Good inspiratory effort.  Abdomen: Soft nontender nondistended with normal bowel sounds x4 quadrants.  Musculoskeletal: No lower extremity edema. 2/4 pulses in all 4 extremities.  Skin: Mottling to all extremities, lower extremities greater than upper extremities.  Psychiatry: Mood is appropriate for condition and setting   Data Reviewed: CBC: Recent Labs  Lab 09/02/20 0236 09/03/20 0500 09/05/20 0430 09/05/20 0547 09/07/20 0230  WBC 13.9* 8.4 13.8* 15.4* 21.0*  NEUTROABS  --  6.4 11.2*  --   --   HGB 12.1 9.1* 9.2* 9.7* 9.3*  HCT 37.1 27.7* 29.1* 30.5* 28.9*  MCV 90.9 90.2 97.7 94.4 93.8  PLT 229 186 255 253 570   Basic Metabolic Panel: Recent Labs  Lab 09/04/20 0520 09/05/20 0430 09/05/20 0547 09/06/20 0500 09/07/20 0230  NA 143 135 142 141 140  K 3.7 6.8* 4.0 4.3 4.5  CL 107 104 107 106 104  CO2 28 27 29 30 31   GLUCOSE 127* 797* 137* 129* 127*  BUN 22 21 23  24* 28*  CREATININE 0.56 0.61 0.51 0.46 0.52  CALCIUM 7.6* 7.9* 8.1* 8.1* 8.2*  MG 2.2 2.3 1.9 2.0 2.1  PHOS 3.4 5.0* 2.3* 3.2 3.3   GFR: Estimated  Creatinine Clearance: 53.6 mL/min (by C-G formula based on SCr of 0.52 mg/dL). Liver Function Tests: Recent Labs  Lab 09/03/20 0500 09/05/20 0430 09/05/20 0547 09/07/20 0230  AST 34 39 45* 48*  ALT 29 32 40 48*  ALKPHOS 90 140* 156* 213*  BILITOT 0.4 <0.1* 0.3 0.3  PROT 3.9* 4.2* 4.8* 4.9*  ALBUMIN 1.9* 2.0* 2.2* 2.2*   No results for input(s): LIPASE, AMYLASE in the last 168 hours. No results for input(s): AMMONIA in the last 168 hours. Coagulation Profile: No results for input(s): INR, PROTIME in the last 168 hours. Cardiac Enzymes: No results for input(s): CKTOTAL, CKMB, CKMBINDEX, TROPONINI in the last 168 hours. BNP (last 3 results) No results for input(s): PROBNP in the last 8760 hours. HbA1C: No results for input(s): HGBA1C in the last 72 hours. CBG: Recent Labs  Lab 09/06/20 1437 09/06/20 1643 09/06/20 2121 09/07/20 0551 09/07/20 1140  GLUCAP 125* 90 125* 122* 132*   Lipid Profile: Recent Labs    09/05/20 0430 09/05/20 0615  TRIG 462* 115  Thyroid Function Tests: Recent Labs    09/07/20 0230  TSH 1.536   Anemia Panel: No results for input(s): VITAMINB12, FOLATE, FERRITIN, TIBC, IRON, RETICCTPCT in the last 72 hours. Urine analysis:    Component Value Date/Time   COLORURINE YELLOW 08/28/2020 2022   APPEARANCEUR HAZY (A) 08/28/2020 2022   LABSPEC 1.012 08/28/2020 2022   PHURINE 5.0 08/28/2020 2022   GLUCOSEU 50 (A) 08/28/2020 2022   HGBUR MODERATE (A) 08/28/2020 2022   BILIRUBINUR NEGATIVE 08/28/2020 2022   KETONESUR 5 (A) 08/28/2020 2022   PROTEINUR 30 (A) 08/28/2020 2022   UROBILINOGEN 0.2 05/25/2011 1337   NITRITE NEGATIVE 08/28/2020 2022   LEUKOCYTESUR TRACE (A) 08/28/2020 2022   Sepsis Labs: @LABRCNTIP (procalcitonin:4,lacticidven:4)  ) Recent Results (from the past 240 hour(s))  Respiratory Panel by RT PCR (Flu A&B, Covid) - Nasopharyngeal Swab     Status: None   Collection Time: 08/28/20  7:25 PM   Specimen: Nasopharyngeal Swab    Result Value Ref Range Status   SARS Coronavirus 2 by RT PCR NEGATIVE NEGATIVE Final    Comment: (NOTE) SARS-CoV-2 target nucleic acids are NOT DETECTED.  The SARS-CoV-2 RNA is generally detectable in upper respiratoy specimens during the acute phase of infection. The lowest concentration of SARS-CoV-2 viral copies this assay can detect is 131 copies/mL. A negative result does not preclude SARS-Cov-2 infection and should not be used as the sole basis for treatment or other patient management decisions. A negative result may occur with  improper specimen collection/handling, submission of specimen other than nasopharyngeal swab, presence of viral mutation(s) within the areas targeted by this assay, and inadequate number of viral copies (<131 copies/mL). A negative result must be combined with clinical observations, patient history, and epidemiological information. The expected result is Negative.  Fact Sheet for Patients:  PinkCheek.be  Fact Sheet for Healthcare Providers:  GravelBags.it  This test is no t yet approved or cleared by the [place] FDA and  has been authorized for detection and/or diagnosis of SARS-CoV-2 by FDA under an Emergency Use Authorization (EUA). This EUA will remain  in effect (meaning this test can be used) for the duration of the COVID-19 declaration under Section 564(b)(1) of the Act, 21 U.S.C. section 360bbb-3(b)(1), unless the authorization is terminated or revoked sooner.     Influenza A by PCR NEGATIVE NEGATIVE Final   Influenza B by PCR NEGATIVE NEGATIVE Final    Comment: (NOTE) The Xpert Xpress SARS-CoV-2/FLU/RSV assay is intended as an aid in  the diagnosis of influenza from Nasopharyngeal swab specimens and  should not be used as a sole basis for treatment. Nasal washings and  aspirates are unacceptable for Xpert Xpress SARS-CoV-2/FLU/RSV  testing.  Fact Sheet for  Patients: PinkCheek.be  Fact Sheet for Healthcare Providers: GravelBags.it  This test is not yet approved or cleared by the [place] FDA and  has been authorized for detection and/or diagnosis of SARS-CoV-2 by  FDA under an Emergency Use Authorization (EUA). This EUA will remain  in effect (meaning this test can be used) for the duration of the  Covid-19 declaration under Section 564(b)(1) of the Act, 21  U.S.C. section 360bbb-3(b)(1), unless the authorization is  terminated or revoked. Performed at [facility], [place] [address] [place], [place] [address]   Blood Culture (routine x 2)     Status: None   Collection Time: 08/28/20  7:50 PM   Specimen: BLOOD  Result Value Ref Range Status   Specimen Description BLOOD SITE NOT  SPECIFIED  Final   Special Requests   Final    BOTTLES DRAWN AEROBIC AND ANAEROBIC Blood Culture results may not be optimal due to an inadequate volume of blood received in culture bottles   Culture   Final    NO GROWTH 5 DAYS Performed at [facility], [place] [address], [place], [place] [address]    Report Status 09/02/2020 FINAL  Final  Blood Culture (routine x 2)     Status: None   Collection Time: 08/28/20  7:51 PM   Specimen: BLOOD LEFT HAND  Result Value Ref Range Status   Specimen Description BLOOD LEFT HAND  Final   Special Requests   Final    BOTTLES DRAWN AEROBIC ONLY Blood Culture results may not be optimal due to an inadequate volume of blood received in culture bottles   Culture   Final    NO GROWTH 5 DAYS Performed at [facility], [place] [address] [place], [place] [address]    Report Status 09/02/2020 FINAL  Final  Urine culture     Status: Abnormal   Collection Time: 08/28/20  8:24 PM   Specimen: Urine, Random  Result Value Ref Range Status   Specimen Description URINE, RANDOM  Final   Special Requests   Final    NONE Performed at [facility], [place] [address], [place], [place] [address]    Culture 30,000 COLONIES/mL PROTEUS MIRABILIS (A)  Final   Report Status 08/31/2020 FINAL  Final   Organism ID, Bacteria PROTEUS MIRABILIS (A)  Final      Susceptibility   Proteus mirabilis - MIC*    AMPICILLIN <=2 SENSITIVE Sensitive     CEFAZOLIN <=4 SENSITIVE Sensitive     CEFTRIAXONE <=0.25 SENSITIVE Sensitive     CIPROFLOXACIN <=0.25 SENSITIVE Sensitive     GENTAMICIN <=1 SENSITIVE Sensitive     IMIPENEM 8 INTERMEDIATE Intermediate     NITROFURANTOIN 128 RESISTANT Resistant     TRIMETH/SULFA <=20 SENSITIVE Sensitive     AMPICILLIN/SULBACTAM <=2 SENSITIVE Sensitive     PIP/TAZO <=4 SENSITIVE Sensitive     * 30,000 COLONIES/mL PROTEUS MIRABILIS  MRSA PCR Screening     Status: None   Collection Time: 08/29/20 11:16 AM   Specimen: Nasal Mucosa; Nasopharyngeal  Result Value Ref Range Status   MRSA by PCR NEGATIVE NEGATIVE Final    Comment:        The GeneXpert MRSA Assay (FDA approved for NASAL specimens only), is one component of a comprehensive MRSA colonization surveillance program. It is not intended to diagnose MRSA infection nor to guide or monitor treatment for MRSA infections. Performed at [facility], [place] [address] [place], [place] [address]       Studies: No results found.  Scheduled Meds:  arformoterol  15 mcg Nebulization BID   budesonide (PULMICORT) nebulizer solution  0.25 mg Nebulization BID   chlorhexidine  15 mL Mouth Rinse BID   Chlorhexidine Gluconate Cloth  6 each Topical Daily   enoxaparin (LOVENOX) injection  30 mg Subcutaneous Q24H   feeding supplement (KATE FARMS STANDARD 1.4)  325 mL Oral BID BM   furosemide  20 mg Intravenous BID   insulin aspart  0-9 Units Subcutaneous TID WC   mouth rinse  15 mL Mouth Rinse q12n4p   metoprolol tartrate  25 mg Oral BID   nicotine  21 mg Transdermal Daily    Continuous Infusions:  TPN ADULT (ION) 65 mL/hr at 09/06/20 1745  TPN  ADULT (ION)       LOS: 10 days     Kayleen Memos, MD [facility] Pager [phone]  If 7PM-7AM, please contact night-coverage [email or web address] Password [email or web address] 09/07/2020, 2:17 PM

## 2020-09-07 NOTE — Progress Notes (Signed)
Pt remains with continuous oxygen; 4L/[place]. Respirations labored; RR 40.  Able to loosen sputum with cough but not expectorating.   Given PRN injectable for dyspnea. Pt aware and okay with giving medication.

## 2020-09-07 NOTE — Evaluation (Addendum)
Physical Therapy Evaluation Patient Details Name: [name] MRN: [id number] DOB: 02-08-1951 Today's Date: 09/07/2020   History of Present Illness  69 yo female admitted with acute on chronic respiratory failure, acute metabolic encephalopathy. Extubated 10/23. Hx of hiatal hernia, hyponatremia, Schatzki's ring  Clinical Impression  On eval, pt required Mod assist to stand for ~10 seconds from recliner. Pt presents with general weakness, decreased activity tolerance, and impaired gait and balance. She c/o fatigue after only sitting up for ~30 minutes prior to my arrival. Remained on 4L [place] O2-sats 93% during session. Brother was present to provide some info (history/PLOF)-pt participated in history taking as well. Explained reason for visit and importance of pt working with therapy to the best of her ability. Discussed d/c plan-pt lives alone. She is currently agreeable to ST rehab at [facility] for continued therapy. Will plan to follow and progress activity as tolerated.     Follow Up Recommendations SNF    Equipment Recommendations  None recommended by PT    Recommendations for Other Services       Precautions / Restrictions Precautions Precautions: Fall Precaution Comments: monitor O2 Restrictions Weight Bearing Restrictions: No      Mobility  Bed Mobility               General bed mobility comments: oob in recliner    Transfers Overall transfer level: Needs assistance Equipment used: Rolling walker (2 wheeled) Transfers: Sit to/from Stand Sit to Stand: Mod assist         General transfer comment: Assist to power up, stabilize, control descent. Cues for safety, hand/feet placement. Pt only able to stand for ~10 seconds before fatigued and abruptly sitting down in recliner.  Ambulation/Gait             General Gait Details: NT-too weak/fatigued   Financial trader Rankin (Stroke Patients Only)       Balance  Overall balance assessment: Needs assistance     Sitting balance - Comments: Lean to R side in supported sitting   Standing balance support: Bilateral upper extremity supported Standing balance-Leahy Scale: Poor                               Pertinent Vitals/Pain Pain Assessment: No/denies pain    Home Living Family/patient expects to be discharged to:: Unsure Living Arrangements: Alone Available Help at Discharge: Family   Home Access: Level entry     Home Layout: One level Home Equipment: Environmental consultant - 4 wheels      Prior Function Level of Independence: Independent         Comments: did not use AD to walk     Hand Dominance        Extremity/Trunk Assessment   Upper Extremity Assessment Upper Extremity Assessment: Defer to OT evaluation    Lower Extremity Assessment Lower Extremity Assessment: Generalized weakness    Cervical / Trunk Assessment Cervical / Trunk Assessment: Kyphotic  Communication   Communication: No difficulties  Cognition Arousal/Alertness: Awake/alert Behavior During Therapy: WFL for tasks assessed/performed                                   General Comments: seems mildly confused at times but follows 1 step commands      General Comments  Exercises General Exercises - Lower Extremity Ankle Circles/Pumps: AROM;5 reps Quad Sets: AROM;5 reps   Assessment/Plan    PT Assessment Patient needs continued PT services  PT Problem List Decreased strength;Decreased mobility;Decreased activity tolerance;Decreased balance;Decreased cognition;Decreased knowledge of use of DME;Decreased safety awareness       PT Treatment Interventions DME instruction;Gait training;Therapeutic activities;Therapeutic exercise;Patient/family education;Balance training;Functional mobility training    PT Goals (Current goals can be found in the Care Plan section)  Acute Rehab PT Goals Patient Stated Goal: pt agreeable to  rehab PT Goal Formulation: With patient/family Time For Goal Achievement: 09/21/20 Potential to Achieve Goals: Fair    Frequency Min 2X/week   Barriers to discharge Decreased caregiver support      Co-evaluation               AM-PAC PT "6 Clicks" Mobility  Outcome Measure Help needed turning from your back to your side while in a flat bed without using bedrails?: A Lot Help needed moving from lying on your back to sitting on the side of a flat bed without using bedrails?: A Lot Help needed moving to and from a bed to a chair (including a wheelchair)?: A Lot Help needed standing up from a chair using your arms (e.g., wheelchair or bedside chair)?: A Lot Help needed to walk in hospital room?: Total Help needed climbing 3-5 steps with a railing? : Total 6 Click Score: 10    End of Session Equipment Utilized During Treatment: Oxygen Activity Tolerance: Patient limited by fatigue Patient left: in chair;with call bell/phone within reach;with family/visitor present Nurse Communication:  (pt reported fatigue. and that I asked pt to try to sit in recliner until 3:00 before returning to bed, if able) PT Visit Diagnosis: Muscle weakness (generalized) (M62.81);Difficulty in walking, not elsewhere classified (R26.2)    Time: 0511-0211 PT Time Calculation (min) (ACUTE ONLY): 15 min   Charges:   PT Evaluation $PT Eval Moderate Complexity: [place], PT Acute Rehabilitation  Office: [phone] Pager: [phone]

## 2020-09-07 NOTE — Procedures (Signed)
PROCEDURE SUMMARY:  Successful US guided right thoracentesis. Yielded 550 ml  of clear yellow fluid. Pt tolerated procedure well. No immediate complications.  Specimen sent for labs. CXR ordered; no post-procedure pneumothorax identified  EBL < 2 mL  Theresa Duty, NP 09/07/2020 4:43 PM

## 2020-09-07 NOTE — Progress Notes (Signed)
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  Assessment: 69 yo F found down at home by brother. Pharmacy consulted 10/22 to start TPN for nutrition. She was intubated 10/17-10/23 and NGT placement unsuccessful secondary to hiatal hernia.   PMH: large hiatal hernia, schatski ring, hyponatremia, smoker   Glucose / Insulin: no hx DM, CBGs adequately controlled (goal 100-150). Range: 90-127 - SSI discontinued by MD on 10/26 Electrolytes: All lytes WNL including CorrCa (9.6) Renal: AKI resolved, SCr WNL/stable.  BUN slightly elevated Hepatic: Alk Phos trending up (90 > 156 > 213), AST/ALT slightly elevated; Tbili WNL Prealbumin: 5.5 (10/23), 7.4 (10/25). Alb low Triglycerides: 115, WNL (10/25) I/O: UOP 1660 mL + unmeasured x1. I/O net: negative - MIVF: None. LR stopped 10/25 AM GI Imaging: Surgeries / Procedures:   Significant Events: -PICC line leaking requiring replacement on 10/26, brief TPN interruption -10/26: Goal rate increased from 55 mL/hr --> 65 mL/hr based on revised goals  Central access: PICC placed 10/22 for TPN, replaced 10/26 TPN start date: 10/22  Nutritional Goals (per RD recommendation 10/25): kCal: 1610-1840, Protein: 75-85 grams, Fluid: >/= 1.6 L/day  Goal TPN rate is 65 mL/hr (provides 84 g of protein and 1617 kcal per day)  Current Nutrition:  -Dysphagia 3 diet, thin liquids per SLP. 25% meal intake charted -[employer] 1.4, Magic Cup BID ordered per dietician  -TPN  Plan:  At 1800 today:  Continue TPN at goal rate of 65 mL/hr  Electrolytes in TPN: Reduce K   Na - 50 mEq/L  K - 25 mEq/L  Ca - 5 mEq/L  Mg - 10 mEq/L  Phos - 20 mmol/L  Cl:Ac ratio 1:1  Add standard MVI and trace elements to TPN; continue Pepcid added to  TPN  Continue CBG checks q8h. SSI stopped by MD  No MIVF at this time, any further changes per MD  Monitor TPN labs on Mon/Thurs  Lenis Noon, PharmD 09/07/20 9:20 AM

## 2020-09-08 ENCOUNTER — Inpatient Hospital Stay (HOSPITAL_COMMUNITY): Payer: Medicare Other

## 2020-09-08 DIAGNOSIS — I5030 Unspecified diastolic (congestive) heart failure: Secondary | ICD-10-CM | POA: Diagnosis not present

## 2020-09-08 DIAGNOSIS — J9620 Acute and chronic respiratory failure, unspecified whether with hypoxia or hypercapnia: Secondary | ICD-10-CM | POA: Diagnosis not present

## 2020-09-08 LAB — MAGNESIUM: Magnesium: 2 mg/dL (ref 1.7–2.4)

## 2020-09-08 LAB — GLUCOSE, CAPILLARY
Glucose-Capillary: 68 mg/dL — ABNORMAL LOW (ref 70–99)
Glucose-Capillary: 90 mg/dL (ref 70–99)
Glucose-Capillary: 43 mg/dL — CL (ref 70–99)
Glucose-Capillary: 68 mg/dL — ABNORMAL LOW (ref 70–99)
Glucose-Capillary: 41 mg/dL — CL (ref 70–99)
Glucose-Capillary: 40 mg/dL — CL (ref 70–99)

## 2020-09-08 LAB — CBC
HCT: 26 % — ABNORMAL LOW (ref 36.0–46.0)
Hemoglobin: 8.7 g/dL — ABNORMAL LOW (ref 12.0–15.0)
MCH: 31.1 pg (ref 26.0–34.0)
MCHC: 33.5 g/dL (ref 30.0–36.0)
MCV: 92.9 fL (ref 80.0–100.0)
Platelets: 358 10*3/uL (ref 150–400)
RBC: 2.8 MIL/uL — ABNORMAL LOW (ref 3.87–5.11)
RDW: 14.6 % (ref 11.5–15.5)
WBC: 19.6 10*3/uL — ABNORMAL HIGH (ref 4.0–10.5)
nRBC: 0 % (ref 0.0–0.2)

## 2020-09-08 LAB — BASIC METABOLIC PANEL
Anion gap: 8 (ref 5–15)
BUN: 27 mg/dL — ABNORMAL HIGH (ref 8–23)
CO2: 32 mmol/L (ref 22–32)
Calcium: 7.5 mg/dL — ABNORMAL LOW (ref 8.9–10.3)
Chloride: 94 mmol/L — ABNORMAL LOW (ref 98–111)
Creatinine, Ser: 0.52 mg/dL (ref 0.44–1.00)
GFR, Estimated: >60 mL/min (ref >60)
Glucose, Bld: 102 mg/dL — ABNORMAL HIGH (ref 70–99)
Potassium: 4 mmol/L (ref 3.5–5.1)
Sodium: 134 mmol/L — ABNORMAL LOW (ref 135–145)

## 2020-09-08 LAB — BLOOD GAS, ARTERIAL
Acid-Base Excess: 9.5 mmol/L — ABNORMAL HIGH (ref 0.0–2.0)
Bicarbonate: 34 mmol/L — ABNORMAL HIGH (ref 20.0–28.0)
Drawn by: 257701
FIO2: 44
O2 Content: 6 L/min
O2 Saturation: 99.3 %
Patient temperature: 98.6
pCO2 arterial: 48.1 mmHg — ABNORMAL HIGH (ref 32.0–48.0)
pH, Arterial: 7.463 — ABNORMAL HIGH (ref 7.350–7.450)
pO2, Arterial: 131 mmHg — ABNORMAL HIGH (ref 83.0–108.0)

## 2020-09-08 LAB — COMPREHENSIVE METABOLIC PANEL
ALT: 38 U/L (ref 0–44)
AST: 37 U/L (ref 15–41)
Albumin: 1.9 g/dL — ABNORMAL LOW (ref 3.5–5.0)
Alkaline Phosphatase: 155 U/L — ABNORMAL HIGH (ref 38–126)
Anion gap: 5 (ref 5–15)
BUN: 28 mg/dL — ABNORMAL HIGH (ref 8–23)
CO2: 31 mmol/L (ref 22–32)
Calcium: 7.7 mg/dL — ABNORMAL LOW (ref 8.9–10.3)
Chloride: 100 mmol/L (ref 98–111)
Creatinine, Ser: 0.45 mg/dL (ref 0.44–1.00)
GFR, Estimated: >60 mL/min (ref >60)
Glucose, Bld: 114 mg/dL — ABNORMAL HIGH (ref 70–99)
Potassium: 4.1 mmol/L (ref 3.5–5.1)
Sodium: 136 mmol/L (ref 135–145)
Total Bilirubin: 0.4 mg/dL (ref 0.3–1.2)
Total Protein: 4.2 g/dL — ABNORMAL LOW (ref 6.5–8.1)

## 2020-09-08 LAB — ECHOCARDIOGRAM COMPLETE
Area-P 1/2: 3.68 cm2
Height: 62 in
Weight: 1700.19 oz

## 2020-09-08 LAB — PHOSPHORUS: Phosphorus: 3.4 mg/dL (ref 2.5–4.6)

## 2020-09-08 MED ORDER — IPRATROPIUM BROMIDE 0.02 % IN SOLN
0.5000 mg | Freq: Three times a day (TID) | RESPIRATORY_TRACT | Status: DC
  Administered 2020-09-08 – 2020-09-09 (×4): 0.5 mg via RESPIRATORY_TRACT
  Filled 2020-09-08 (×3): qty 2.5

## 2020-09-08 MED ORDER — DEXTROSE 50 % IV SOLN
INTRAVENOUS | Status: AC
  Filled 2020-09-08: qty 50

## 2020-09-08 MED ORDER — POTASSIUM CHLORIDE CRYS ER 20 MEQ PO TBCR
20.0000 meq | EXTENDED_RELEASE_TABLET | Freq: Every day | ORAL | Status: AC
  Administered 2020-09-09 – 2020-09-11 (×3): 20 meq via ORAL
  Filled 2020-09-08 (×4): qty 1

## 2020-09-08 MED ORDER — GABAPENTIN 100 MG PO CAPS
100.0000 mg | ORAL_CAPSULE | Freq: Two times a day (BID) | ORAL | Status: DC
  Administered 2020-09-08 – 2020-09-14 (×13): 100 mg via ORAL
  Filled 2020-09-08 (×14): qty 1

## 2020-09-08 MED ORDER — TRAVASOL 10 % IV SOLN
INTRAVENOUS | Status: AC
  Filled 2020-09-08: qty 518.4

## 2020-09-08 MED ORDER — LIP MEDEX EX OINT
TOPICAL_OINTMENT | CUTANEOUS | Status: AC
  Filled 2020-09-08: qty 7

## 2020-09-08 MED ORDER — LEVALBUTEROL HCL 0.63 MG/3ML IN NEBU
0.6300 mg | INHALATION_SOLUTION | Freq: Three times a day (TID) | RESPIRATORY_TRACT | Status: DC
  Administered 2020-09-08 (×2): 0.63 mg via RESPIRATORY_TRACT
  Filled 2020-09-08: qty 3

## 2020-09-08 MED ORDER — FUROSEMIDE 10 MG/ML IJ SOLN
20.0000 mg | Freq: Four times a day (QID) | INTRAMUSCULAR | Status: DC
  Administered 2020-09-08 – 2020-09-11 (×12): 20 mg via INTRAVENOUS
  Filled 2020-09-08 (×13): qty 2

## 2020-09-08 MED ORDER — LEVALBUTEROL HCL 1.25 MG/0.5ML IN NEBU
1.2500 mg | INHALATION_SOLUTION | Freq: Three times a day (TID) | RESPIRATORY_TRACT | Status: DC
  Administered 2020-09-08 – 2020-09-09 (×2): 1.25 mg via RESPIRATORY_TRACT
  Filled 2020-09-08 (×2): qty 0.5

## 2020-09-08 MED ORDER — TRAMADOL HCL 50 MG PO TABS
50.0000 mg | ORAL_TABLET | Freq: Two times a day (BID) | ORAL | Status: DC | PRN
  Administered 2020-09-08 – 2020-09-14 (×5): 50 mg via ORAL
  Filled 2020-09-08 (×5): qty 1

## 2020-09-08 NOTE — Progress Notes (Signed)
°  Echocardiogram 2D Echocardiogram has been performed.  [name] 09/08/2020, 2:14 PM

## 2020-09-08 NOTE — Progress Notes (Addendum)
PROGRESS NOTE  [name] XYV:[id number] DOB: [birth year] DOA: 08/28/2020 PCP: Nicholes Rough, PA-C  HPI/Recap of past 24 hours: PCCM to [facility] transfer 09/06/2020: 69 year old female with history of hiatal hernia, Schatzki's ring, hyponatremia, tobacco abuse, suspected SIADH, who was found down at home by her brother after she was last known normal the day PTA.  When EMS arrived, she was hypoxic in the sixties and had difficulty breathing.  She was placed on NRB, and when assessed in the ED, decision was made to intubate.  She was admitted to ICU for acute respiratory failure with hypoxia due to suspected aspiration pneumonia versus CAP, acute metabolic encephalopathy and acute kidney injury.  Extubated 10/23 and transferred to telemetry 09/06/20.  Due to persistent tachypnea, PCCM reevaluated on 09/06/20. Patient lives alone, manages her own meds. She has had substantial weight loss over the last 1 year.  Elevate BNP>3000 with bilateral pleural effusions R>L.  Post right thoracentesis by IR with greater than 500 cc removed.  Started on IV Lasix 20 mg every 6 hours on 09/07/2020.  2D echo ordered and pending.  09/08/20: Feels better this morning, breathing has improved.  With pain in her left foot, tender with minimal touch, possibly gout.    Assessment/Plan: Active Problems:   Hyponatremia   Acute and chr resp failure, unsp w hypoxia or hypercapnia (HCC)   Malnutrition of moderate degree   Hiatal hernia   Acute respiratory failure (HCC)   Palliative care by specialist   Goals of care, counseling/discussion   Advanced care planning/counseling discussion  Improving acute hypoxic hypercarbic respiratory failure likely multifactorial secondary to bilateral pleural effusion, mild pulmonary edema, suspected aspiration versus treated community-acquired pneumonia She presented from home severely hypoxic, was intubated on presentation 08/28/2020 and extubated on 09/03/2020. Not on oxygen  supplementation at baseline currently on 4 L with O2 saturation 100%. Last ABG done on 09/06/2020 on 3 L nasal cannula showed pH 7.356/54/132 Continue to avoid narcotics.  Continue continuous pulse oximetry Maintain O2 saturation greater than 90%  Acute unspecified CHF Bilateral pleural effusion, pulmonary edema, BNP greater than 3000 2D echo ordered and results are pending Continue IV diuretics Start strict I's and O's and daily weight  Bilateral pleural effusions right greater than left, mild pulmonary edema, likely cardiogenic, post right thoracentesis by IR with greater than 500 cc removed BNP greater than 3000 on 09/07/2020 2D echo pending. Continue IV Lasix 20 mg every 6 hours. Continue status monitor and flutter valve. Continue to monitor volume status and urine output.  Leukocytosis, likely reactive Nontoxic-appearing Afebrile Negative procalcitonin Continue to monitor off antibiotics  Elevated liver chemistries, improving Trend alkaline phosphatase, AST and ALT LFTs are downtrending Continue to avoid hepatotoxic agents  Left foot pain, tender with minimal touch, suspect gout No significant edema for joint fluid aspiration We will obtain uric acid level and monitor Pain control  Essential hypertension BP stable Continue home regimen  Severe protein calorie malnutrition BMI 20 Albumin 2.2 Severe muscle mass loss Encourage increase in oral protein calorie intake Oral supplement  Dysphagia, improved Seen by speech therapist with recommendation for regular consistency diet and thin liquid Continue aspiration precaution  Goals of care Palliative care team following and assisting with establishing goals of care Currently she is DNR PMT:DNR with the caveat that patient is agreeable to ventilator if needed if her problem is respiratory only. -Continue current scope of care -No feeding tube -[name] is agreeable to SNF placement rehab short-term but but would  not  want long-term.     Code Status: DNR  Family Communication: None at bedside  Disposition Plan: Likely SNF   Consultants:  PCCM  Palliative care medicine  Procedures: Intubation Extubation  Antimicrobials:  None.  DVT prophylaxis: Subcu Lovenox daily  Status is: Inpatient    Dispo: The patient is from: Home.              Anticipated d/c is to: SNF.               Anticipated d/c date is: 09/10/2020.              Patient currently not stable for discharge due to ongoing evaluation of acute hypoxic hypercarbic respiratory failure.        Objective: Vitals:   09/08/20 0759 09/08/20 1100 09/08/20 1210 09/08/20 1548  BP: 121/84  115/69 104/60  Pulse: 90  (!) 57 88  Resp: (!) 30  (!) 27 (!) 32  Temp: 98.4 F (36.9 C)  98 F (36.7 C) 98.7 F (37.1 C)  TempSrc: Oral  Oral Oral  SpO2: 100% 100%  100%  Weight:      Height:        Intake/Output Summary (Last 24 hours) at 09/08/2020 1643 Last data filed at 09/08/2020 1500 Gross per 24 hour  Intake 719.5 ml  Output 2550 ml  Net -1830.5 ml   Filed Weights   09/06/20 0441 09/07/20 0553 09/08/20 0420  Weight: 45.7 kg 49.8 kg 48.2 kg    Exam:  . General: 69 y.o. year-old female pleasant in no acute distress.  Alert and active x3.   . Cardiovascular: Regular rate and rhythm no rubs or gallops.   Marland Kitchen Respiratory: Mild rales at bases no wheezing noted.  Good inspiratory effort. . Abdomen: Soft nontender normal bowel sounds present.  . Musculoskeletal: Trace edema in lower extremities bilaterally.  Left foot tender with palpation, worse at the dorsal region.. . Skin: Mottling to all extremities, lower extremities greater than upper extremities. Marland Kitchen Psychiatry: Mood is appropriate for condition and setting.  Data Reviewed: CBC: Recent Labs  Lab 09/03/20 0500 09/03/20 0500 09/05/20 0430 09/05/20 0547 09/07/20 0230 09/07/20 1701 09/08/20 0758  WBC 8.4   < > 13.8* 15.4* 21.0* 22.6* 19.6*  NEUTROABS  6.4  --  11.2*  --   --  19.5*  --   HGB 9.1*   < > 9.2* 9.7* 9.3* 8.9* 8.7*  HCT 27.7*   < > 29.1* 30.5* 28.9* 27.7* 26.0*  MCV 90.2   < > 97.7 94.4 93.8 94.5 92.9  PLT 186   < > 255 253 348 359 358   < > = values in this interval not displayed.   Basic Metabolic Panel: Recent Labs  Lab 09/05/20 0430 09/05/20 0547 09/06/20 0500 09/07/20 0230 09/08/20 0436  NA 135 142 141 140 136  K 6.8* 4.0 4.3 4.5 4.1  CL 104 107 106 104 100  CO2 27 29 30 31 31   GLUCOSE 797* 137* 129* 127* 114*  BUN 21 23 24* 28* 28*  CREATININE 0.61 0.51 0.46 0.52 0.45  CALCIUM 7.9* 8.1* 8.1* 8.2* 7.7*  MG 2.3 1.9 2.0 2.1 2.0  PHOS 5.0* 2.3* 3.2 3.3 3.4   GFR: Estimated Creatinine Clearance: 51.9 mL/min (by C-G formula based on SCr of 0.45 mg/dL). Liver Function Tests: Recent Labs  Lab 09/03/20 0500 09/05/20 0430 09/05/20 0547 09/07/20 0230 09/08/20 0436  AST 34 39 45* 48* 37  ALT 29 32 40 48* 38  ALKPHOS 90 140* 156* 213* 155*  BILITOT 0.4 <0.1* 0.3 0.3 0.4  PROT 3.9* 4.2* 4.8* 4.9* 4.2*  ALBUMIN 1.9* 2.0* 2.2* 2.2* 1.9*   No results for input(s): LIPASE, AMYLASE in the last 168 hours. No results for input(s): AMMONIA in the last 168 hours. Coagulation Profile: No results for input(s): INR, PROTIME in the last 168 hours. Cardiac Enzymes: No results for input(s): CKTOTAL, CKMB, CKMBINDEX, TROPONINI in the last 168 hours. BNP (last 3 results) No results for input(s): PROBNP in the last 8760 hours. HbA1C: No results for input(s): HGBA1C in the last 72 hours. CBG: Recent Labs  Lab 09/07/20 1140 09/07/20 1638 09/07/20 2115 09/08/20 0755 09/08/20 1144  GLUCAP 132* 71 108* 68* 90   Lipid Profile: No results for input(s): CHOL, HDL, LDLCALC, TRIG, CHOLHDL, LDLDIRECT in the last 72 hours. Thyroid Function Tests: Recent Labs    09/07/20 0230  TSH 1.536   Anemia Panel: No results for input(s): VITAMINB12, FOLATE, FERRITIN, TIBC, IRON, RETICCTPCT in the last 72 hours. Urine analysis:     Component Value Date/Time   COLORURINE YELLOW 08/28/2020 2022   APPEARANCEUR HAZY (A) 08/28/2020 2022   LABSPEC 1.012 08/28/2020 2022   PHURINE 5.0 08/28/2020 2022   GLUCOSEU 50 (A) 08/28/2020 2022   HGBUR MODERATE (A) 08/28/2020 2022   BILIRUBINUR NEGATIVE 08/28/2020 2022   KETONESUR 5 (A) 08/28/2020 2022   PROTEINUR 30 (A) 08/28/2020 2022   UROBILINOGEN 0.2 05/25/2011 1337   NITRITE NEGATIVE 08/28/2020 2022   LEUKOCYTESUR TRACE (A) 08/28/2020 2022   Sepsis Labs: @LABRCNTIP (procalcitonin:4,lacticidven:4)  ) Recent Results (from the past 240 hour(s))  Body fluid culture     Status: None (Preliminary result)   Collection Time: 09/07/20  3:57 PM   Specimen: PATH Cytology Pleural fluid  Result Value Ref Range Status   Specimen Description   Final    PLEURAL Performed at [facility], [place] [address], [place], [place] [address]    Special Requests   Final    NONE Performed at [facility], [place] [address], [place], [place] [address]    Gram Stain   Final    WBC PRESENT,BOTH PMN AND MONONUCLEAR NO ORGANISMS SEEN CYTOSPIN SMEAR    Culture   Final    NO GROWTH < 12 HOURS Performed at [facility], [place] [address] [place], [place] [address]    Report Status PENDING  Incomplete      Studies: No results found.  Scheduled Meds: . arformoterol  15 mcg Nebulization BID  . budesonide (PULMICORT) nebulizer solution  0.25 mg Nebulization BID  . chlorhexidine  15 mL Mouth Rinse BID  . Chlorhexidine Gluconate Cloth  6 each Topical Daily  . enoxaparin (LOVENOX) injection  30 mg Subcutaneous Q24H  . feeding supplement (KATE FARMS STANDARD 1.4)  325 mL Oral BID BM  . furosemide  20 mg Intravenous Q6H  . gabapentin  100 mg Oral BID  . ipratropium  0.5 mg Nebulization TID  . levalbuterol  1.25 mg Nebulization TID  . mouth rinse  15 mL Mouth Rinse q12n4p  . metoprolol tartrate  25 mg Oral BID  . nicotine  21 mg Transdermal Daily  .  [START ON 09/09/2020] potassium chloride  20 mEq Oral Daily  . sodium chloride  3 mL Nebulization TID    Continuous Infusions: . TPN ADULT (ION) 65 mL/hr at 09/07/20 1703  . TPN ADULT (ION)       LOS: 11 days  Kayleen Memos, MD [facility] Pager [phone]  If 7PM-7AM, please contact night-coverage [email or web address] Password [facility] 09/08/2020, 4:43 PM

## 2020-09-08 NOTE — Progress Notes (Signed)
Notified Lab that ABG being sent for analysis. 

## 2020-09-08 NOTE — Progress Notes (Signed)
Low CBG of 41 @ 1850 was double checked with a BMP and Glucose = 102 @1902 

## 2020-09-08 NOTE — Progress Notes (Signed)
MD notified of decreasing CBG readings after continued hypoglycemic protocols being initiated.  Orders placed for labs to assess situation before continuing further measures.  Will alert oncoming nurse of the situation.  Virginia Rochester, RN

## 2020-09-08 NOTE — Evaluation (Signed)
Occupational Therapy Evaluation Patient Details Name: [name] MRN: [id number] DOB: Nov 12, 1951 Today's Date: 09/08/2020    History of Present Illness 69 yo female admitted after found down with acute on chronic respiratory failure, acute metabolic encephalopathy. Extubated 10/23. Hx of hiatal hernia, hyponatremia, Schatzki's ring   Clinical Impression   Patient with functional deficits listed below impacting safety and independence with self care. At baseline patient is independent with self care and lives alone with brother that checks on her intermittently. Per chart review patient has been having multiple medical problems within past year and decline in health/caring for self. Patient min A with bathing LB and supervision for UB at bed level/sitting EOB to finish UB. Patient min A for trunk support to sitting, with cues for safety as she scoots too far forward to EOB. Patient mod A for stand pivot to recliner with limited eccentric control into chair. Note brief drop to mid 80s on 3L however recovered quickly to mid 90s seated in recliner. Recommend continued acute OT services in order to facilitate D/C to venue listed below.    Follow Up Recommendations  SNF;Supervision/Assistance - 24 hour    Equipment Recommendations  Other (comment) (defer to next venue)       Precautions / Restrictions Precautions Precautions: Fall Precaution Comments: monitor O2 Restrictions Weight Bearing Restrictions: No      Mobility Bed Mobility Overal bed mobility: Needs Assistance Bed Mobility: Supine to Sit     Supine to sit: Min assist     General bed mobility comments: min A for trunk support to sitting     Transfers Overall transfer level: Needs assistance Equipment used: None Transfers: Stand Pivot Transfers   Stand pivot transfers: Mod assist       General transfer comment: mod A to power up and safely complete pivot to recliner with limited eccentric control     Balance  Overall balance assessment: Needs assistance Sitting-balance support: Feet supported Sitting balance-Leahy Scale: Fair     Standing balance support: During functional activity Standing balance-Leahy Scale: Poor Standing balance comment: reliant on external assistance                           ADL either performed or assessed with clinical judgement   ADL Overall ADL's : Needs assistance/impaired Eating/Feeding: Set up;Sitting   Grooming: Set up;Sitting   Upper Body Bathing: Supervision/ safety;Sitting Upper Body Bathing Details (indicate cue type and reason): assist to wash back, otherwise patient able to wash UB Lower Body Bathing: Minimal assistance;Sit to/from stand;Bed level Lower Body Bathing Details (indicate cue type and reason): patient able to wash LEs and peri area except assist with buttock Upper Body Dressing : Supervision/safety;Sitting   Lower Body Dressing: Maximal assistance;Sit to/from stand;Bed level Lower Body Dressing Details (indicate cue type and reason): to don socks Toilet Transfer: Moderate assistance;Cueing for safety;Stand-pivot;BSC Toilet Transfer Details (indicate cue type and reason): verbal cues not to scoot too far forward, stand pivot to recliner with limited eccentric control into chair  Toileting- Clothing Manipulation and Hygiene: Moderate assistance;Sitting/lateral lean;Sit to/from stand       Functional mobility during ADLs: Moderate assistance;Cueing for safety;Cueing for sequencing General ADL Comments: patient requiring increased assistance with self care due to decreased strength, activity tolerance, balance, safety awareness and pain                   Pertinent Vitals/Pain Pain Assessment: Faces Faces Pain Scale: Hurts even  more Pain Location: L foot Pain Descriptors / Indicators: Burning Pain Intervention(s): Patient requesting pain meds-RN notified     Hand Dominance Right   Extremity/Trunk Assessment Upper  Extremity Assessment Upper Extremity Assessment: Generalized weakness   Lower Extremity Assessment Lower Extremity Assessment: Defer to PT evaluation       Communication Communication Communication: No difficulties   Cognition Arousal/Alertness: Awake/alert Behavior During Therapy: WFL for tasks assessed/performed Overall Cognitive Status: No family/caregiver present to determine baseline cognitive functioning                                 General Comments: mild impulsivity scooting too far forward at EOB, otherwise following 1 step directions appropriately    General Comments  pt on 3L note brief desaturation to 85% however recovered quickly to 90s seated in [place] expects to be discharged to:: Skilled nursing facility Living Arrangements: Alone Available Help at Discharge: Family   Home Access: Level entry     Home Layout: One level     Bathroom Shower/Tub: Tub only   Biochemist, clinical: Standard     Home Equipment: Environmental consultant - 4 wheels          Prior Functioning/Environment Level of Independence: Independent        Comments: did not use AD to walk        OT Problem List: Decreased strength;Decreased activity tolerance;Impaired balance (sitting and/or standing);Decreased knowledge of use of DME or AE;Decreased safety awareness;Pain      OT Treatment/Interventions: Self-care/ADL training;Therapeutic exercise;Energy conservation;DME and/or AE instruction;Therapeutic activities;Patient/family education;Balance training    OT Goals(Current goals can be found in the care plan section) Acute Rehab OT Goals Patient Stated Goal: pt agreeable to rehab OT Goal Formulation: With patient Time For Goal Achievement: 09/22/20 Potential to Achieve Goals: Good  OT Frequency: Min 2X/week    AM-PAC OT "6 Clicks" Daily Activity     Outcome Measure Help from another person eating meals?: A Little Help from another  person taking care of personal grooming?: A Little Help from another person toileting, which includes using toliet, bedpan, or urinal?: A Lot Help from another person bathing (including washing, rinsing, drying)?: A Little Help from another person to put on and taking off regular upper body clothing?: A Little Help from another person to put on and taking off regular lower body clothing?: A Lot 6 Click Score: 16   End of Session Equipment Utilized During Treatment: Gait belt Nurse Communication: Mobility status;Patient requests pain meds  Activity Tolerance: Patient tolerated treatment well Patient left: in chair;with call bell/phone within reach;with chair alarm set  OT Visit Diagnosis: Other abnormalities of gait and mobility (R26.89);Muscle weakness (generalized) (M62.81);History of falling (Z91.81);Pain Pain - Right/Left: Left Pain - part of body: Ankle and joints of foot                Time: 3382-5053 OT Time Calculation (min): 33 min Charges:  OT General Charges $OT Visit: 1 Visit OT Evaluation $OT Eval Moderate Complexity: 1 Mod OT Treatments $Self Care/Home Management : 8-22 mins  Delbert Phenix OT OT pager: [place] 09/08/2020, 8:30 AM

## 2020-09-08 NOTE — NC FL2 (Signed)
[name] LEVEL OF CARE SCREENING TOOL     IDENTIFICATION  Patient Name: [name] [facility] Birthdate: 1951/11/08 Sex: female Admission Date (Current Location): 08/28/2020  [facility] and [place] Number:  Herbalist and Address:  [name],  [place], [name]      Provider Number: [id number]  Attending Physician Name and Address:  Kayleen Memos, DO  Relative Name and Phone Number:  CrystalByrd (Brother) [phone]    Current Level of Care: Hospital Recommended Level of Care: [place] Prior Approval Number:    Date Approved/Denied:   PASRR Number: [id number]  Discharge Plan: SNF    Current Diagnoses: Patient Active Problem List   Diagnosis Date Noted  . Acute respiratory failure ([place])   . Palliative care by specialist   . Goals of care, counseling/discussion   . Advanced care planning/counseling discussion   . Hiatal hernia   . Malnutrition of moderate degree 08/29/2020  . Respiratory failure ([place])   . Community acquired pneumonia of right lower lobe of lung   . Somnolence   . Failure to thrive in adult   . Acute and chr resp failure, unsp w hypoxia or hypercapnia ([place]) 08/28/2020  . Generalized weakness 05/19/2020  . Hypokalemia 05/19/2020  . Hyponatremia 02/03/2020  . Ambulatory dysfunction   . Prediabetes 03/23/2013  . Hyperlipidemia 03/23/2013  . Colon cancer screening 11/18/2012  . Hypertension 09/16/2012  . GERD (gastroesophageal reflux disease) 09/16/2012  . Tobacco use 09/16/2012    Orientation RESPIRATION BLADDER Height & Weight     Self, Time, Situation, Place  O2 (4L ) External catheter Weight: 48.2 kg Height:  5\' 2"  (157.5 cm)  BEHAVIORAL SYMPTOMS/MOOD NEUROLOGICAL BOWEL NUTRITION STATUS   (none)  (none) Continent Diet (see d/c summary)  AMBULATORY STATUS COMMUNICATION OF NEEDS Skin   Extensive Assist Verbally Normal                       Personal Care Assistance  Level of Assistance  Bathing, Feeding, Dressing Bathing Assistance: Limited assistance Feeding assistance: Independent Dressing Assistance: Limited assistance     Functional Limitations Info  Sight, Hearing, Speech Sight Info: Adequate Hearing Info: Adequate Speech Info: Adequate    SPECIAL CARE FACTORS FREQUENCY  PT (By licensed PT), OT (By licensed OT)     PT Frequency: 5X/W OT Frequency: 5X/W            Contractures Contractures Info: Not present    Additional Factors Info  Code Status, Allergies Code Status Info: DNR Allergies Info: Iodine, Thiazide Diuretics, Shellfish           Current Medications (09/08/2020):  This is the current hospital active medication list Current Facility-Administered Medications  Medication Dose Route Frequency Provider Last Rate Last Admin  . acetaminophen (TYLENOL) tablet 650 mg  650 mg Oral Q6H PRN Hongalgi, Anand D, MD      . arformoterol (BROVANA) nebulizer solution 15 mcg  15 mcg Nebulization BID Spero Geralds, MD   15 mcg at 09/08/20 9024  . budesonide (PULMICORT) nebulizer solution 0.25 mg  0.25 mg Nebulization BID Spero Geralds, MD   0.25 mg at 09/08/20 0973  . chlorhexidine (PERIDEX) 0.12 % solution 15 mL  15 mL Mouth Rinse BID Spero Geralds, MD   15 mL at 09/08/20 1127  . Chlorhexidine Gluconate Cloth 2 % PADS 6 each  6 each Topical Daily Spero Geralds, MD   6 each  at 09/08/20 1129  . docusate (COLACE) 50 MG/5ML liquid 100 mg  100 mg Oral BID PRN Hongalgi, Anand D, MD      . enoxaparin (LOVENOX) injection 30 mg  30 mg Subcutaneous Q24H Spero Geralds, MD   30 mg at 09/07/20 1625  . feeding supplement (KATE FARMS STANDARD 1.4) liquid 325 mL  325 mL Oral BID BM Spero Geralds, MD   325 mL at 09/08/20 1126  . furosemide (LASIX) injection 20 mg  20 mg Intravenous Q6H Hall, Carole N, DO   20 mg at 09/08/20 1128  . gabapentin (NEURONTIN) capsule 100 mg  100 mg Oral BID Irene Pap N, DO   100 mg at 09/08/20 1127  .  ipratropium (ATROVENT) nebulizer solution 0.5 mg  0.5 mg Nebulization TID Irene Pap N, DO   0.5 mg at 09/08/20 1400  . levalbuterol (XOPENEX) nebulizer solution 1.25 mg  1.25 mg Nebulization TID Irene Pap N, DO      . MEDLINE mouth rinse  15 mL Mouth Rinse q12n4p Spero Geralds, MD   15 mL at 09/08/20 1417  . metoprolol tartrate (LOPRESSOR) injection 2.5-5 mg  2.5-5 mg Intravenous Q3H PRN Spero Geralds, MD   5 mg at 09/07/20 0837  . metoprolol tartrate (LOPRESSOR) tablet 25 mg  25 mg Oral BID Modena Jansky, MD   25 mg at 09/08/20 1127  . morphine 2 MG/ML injection 2 mg  2 mg Intravenous Q2H PRN Lang Snow, FNP   2 mg at 09/07/20 2050  . nicotine (NICODERM CQ - dosed in mg/24 hours) patch 21 mg  21 mg Transdermal Daily Spero Geralds, MD   21 mg at 09/08/20 1133  . polyethylene glycol (MIRALAX / GLYCOLAX) packet 17 g  17 g Oral Daily PRN Modena Jansky, MD      . Derrill Memo ON 09/09/2020] potassium chloride SA (KLOR-CON) CR tablet 20 mEq  20 mEq Oral Daily Hall, Carole N, DO      . sodium chloride 0.9 % nebulizer solution 3 mL  3 mL Nebulization TID Irene Pap N, DO   3 mL at 09/08/20 1401  . sodium chloride flush (NS) 0.9 % injection 10-40 mL  10-40 mL Intracatheter PRN Hongalgi, Lenis Dickinson, MD      . TPN ADULT (ION)   Intravenous Continuous TPN Lenis Noon, RPH 65 mL/hr at 09/07/20 1703 New Bag at 09/07/20 1703  . TPN ADULT (ION)   Intravenous Continuous TPN Lenis Noon, RPH      . traMADol Veatrice Bourbon) tablet 50 mg  50 mg Oral Q12H PRN Kayleen Memos, DO   50 mg at 09/08/20 1146     Discharge Medications: Please see discharge summary for a list of discharge medications.  Relevant Imaging Results:  Relevant Lab Results:   Additional Information [id number] [name]

## 2020-09-08 NOTE — TOC Progression Note (Signed)
Transition of Care [facility]) - Progression Note    Patient Details  Name: [name] MRN: [id number] Date of Birth: 06-05-1951  Transition of Care [facility]) CM/SW [place], [place] Phone Number: 09/08/2020, 3:14 PM  Clinical Narrative:   Patient seen in follow up to PT/OT recommendation of SNF.  [name] is well aware of her current situation and her limits in terms of ability to care for self as she lives alone.  She is open to going to short term rehab.  Bed search inititated.  Insurance authorization initiated. TOC will continue to follow during the course of hospitalization.     Expected Discharge Plan: Skilled Nursing Facility Barriers to Discharge: SNF Pending bed offer  Expected Discharge Plan and Services Expected Discharge Plan: [place]   Discharge Planning Services: CM Consult   Living arrangements for the past 2 months: Apartment                                       Social Determinants of Health (SDOH) Interventions    Readmission Risk Interventions No flowsheet data found.

## 2020-09-08 NOTE — Progress Notes (Signed)
Libre CGM has been applied to left posterior upper arm. Pt has unreliably low finger sticks so we are attempting this

## 2020-09-08 NOTE — Progress Notes (Addendum)
PHARMACY - TOTAL PARENTERAL NUTRITION CONSULT NOTE   Indication: chronic intolerance to enteral feeding  Patient Measurements: Height: '5\' 2"'  (157.5 cm) Weight: 48.2 kg (106 lb 4.2 oz) IBW/kg (Calculated) : 50.1 TPN AdjBW (KG): 36.3 Body mass index is 19.44 kg/m.  Assessment: 69 yo F found down at home by brother. Pharmacy consulted 10/22 to start TPN for nutrition. She was intubated 10/17-10/23 and NGT placement unsuccessful secondary to hiatal hernia.   PMH: large hiatal hernia, schatski ring, hyponatremia, smoker   Glucose / Insulin: no hx DM, CBG goal 100-150. Range: 68-132 - 2 units SSI/24 hrs Electrolytes: All lytes WNL including CorrCa (9.4) Renal: AKI resolved, SCr WNL/stable.  BUN slightly elevated Hepatic: Alk Phos trending down, AST/ALT WNL; Tbili WNL Prealbumin: 5.5 (10/23) > 7.4 (10/25). Alb low Triglycerides: 115, WNL (10/25) I/O: Unmeasured UOP x2.  - MIVF: None. LR stopped 10/25 AM GI Imaging: Surgeries / Procedures:   Significant Events: -PICC line leaking requiring replacement on 10/26, brief TPN interruption -10/26: Goal rate increased from 55 mL/hr --> 65 mL/hr based on revised goals  Central access: PICC placed 10/22 for TPN, replaced 10/26 TPN start date: 10/22  Nutritional Goals (per RD recommendation 10/25): kCal: 1610-1840, Protein: 75-85 grams, Fluid: >/= 1.6 L/day  Goal TPN rate is 65 mL/hr (provides 84 g of protein and 1617 kcal per day)  Current Nutrition:  -Regular diet. 25% meal intake charted -[employer] 1.4, Magic Cup BID ordered per dietician  -TPN  Plan:  Per discussion with MD, will start weaning TPN. Plan to reduce to 1/2 of goal rate tonight and taper off completely tomorrow.  At 1800 today:  Reduce TPN to 40 mL/hr (1/2 of goal rate)  Electrolytes in TPN: Reduce Phos   Na - 50 mEq/L  K - 25 mEq/L  Ca - 5 mEq/L  Mg - 10 mEq/L  Phos - 15 mmol/L  Cl:Ac ratio 1:1  Add standard MVI and trace elements to TPN; continue  Pepcid added to TPN  Continue CBG checks TID AC and qHS. SSI discontinued.  No MIVF at this time, any further changes per MD  Monitor TPN labs on Mon/Thurs  Lenis Noon, PharmD 09/08/20 10:52 AM

## 2020-09-09 LAB — CYTOLOGY - NON PAP

## 2020-09-09 LAB — CBC WITH DIFFERENTIAL/PLATELET
Abs Immature Granulocytes: 0.18 10*3/uL — ABNORMAL HIGH (ref 0.00–0.07)
Basophils Absolute: 0.1 10*3/uL (ref 0.0–0.1)
Basophils Relative: 0 %
Eosinophils Absolute: 0.1 10*3/uL (ref 0.0–0.5)
Eosinophils Relative: 0 %
HCT: 23.5 % — ABNORMAL LOW (ref 36.0–46.0)
Hemoglobin: 7.9 g/dL — ABNORMAL LOW (ref 12.0–15.0)
Immature Granulocytes: 1 %
Lymphocytes Relative: 6 %
Lymphs Abs: 1.2 10*3/uL (ref 0.7–4.0)
MCH: 31.3 pg (ref 26.0–34.0)
MCHC: 33.6 g/dL (ref 30.0–36.0)
MCV: 93.3 fL (ref 80.0–100.0)
Monocytes Absolute: 1 10*3/uL (ref 0.1–1.0)
Monocytes Relative: 5 %
Neutro Abs: 19 10*3/uL — ABNORMAL HIGH (ref 1.7–7.7)
Neutrophils Relative %: 88 %
Platelets: 369 10*3/uL (ref 150–400)
RBC: 2.52 MIL/uL — ABNORMAL LOW (ref 3.87–5.11)
RDW: 15 % (ref 11.5–15.5)
WBC: 21.5 10*3/uL — ABNORMAL HIGH (ref 4.0–10.5)
nRBC: 0 % (ref 0.0–0.2)

## 2020-09-09 LAB — COMPREHENSIVE METABOLIC PANEL
ALT: 36 U/L (ref 0–44)
AST: 37 U/L (ref 15–41)
Albumin: 1.9 g/dL — ABNORMAL LOW (ref 3.5–5.0)
Alkaline Phosphatase: 138 U/L — ABNORMAL HIGH (ref 38–126)
Anion gap: 7 (ref 5–15)
BUN: 28 mg/dL — ABNORMAL HIGH (ref 8–23)
CO2: 34 mmol/L — ABNORMAL HIGH (ref 22–32)
Calcium: 7.4 mg/dL — ABNORMAL LOW (ref 8.9–10.3)
Chloride: 90 mmol/L — ABNORMAL LOW (ref 98–111)
Creatinine, Ser: 0.61 mg/dL (ref 0.44–1.00)
GFR, Estimated: >60 mL/min (ref >60)
Glucose, Bld: 192 mg/dL — ABNORMAL HIGH (ref 70–99)
Potassium: 3.6 mmol/L (ref 3.5–5.1)
Sodium: 131 mmol/L — ABNORMAL LOW (ref 135–145)
Total Bilirubin: 0.4 mg/dL (ref 0.3–1.2)
Total Protein: 4.5 g/dL — ABNORMAL LOW (ref 6.5–8.1)

## 2020-09-09 LAB — PH, BODY FLUID: pH, Body Fluid: 7.4

## 2020-09-09 MED ORDER — RESOURCE INSTANT PROTEIN PO PWD PACKET
6.0000 g | Freq: Three times a day (TID) | ORAL | Status: DC
  Administered 2020-09-09 – 2020-09-14 (×11): 6 g via ORAL
  Filled 2020-09-09 (×18): qty 6

## 2020-09-09 MED ORDER — PANTOPRAZOLE SODIUM 40 MG PO TBEC
40.0000 mg | DELAYED_RELEASE_TABLET | Freq: Every day | ORAL | Status: DC
  Administered 2020-09-09 – 2020-09-10 (×2): 40 mg via ORAL
  Filled 2020-09-09 (×2): qty 1

## 2020-09-09 MED ORDER — ALUM & MAG HYDROXIDE-SIMETH 200-200-20 MG/5ML PO SUSP
30.0000 mL | ORAL | Status: DC | PRN
  Administered 2020-09-09 – 2020-09-13 (×10): 30 mL via ORAL
  Filled 2020-09-09 (×9): qty 30

## 2020-09-09 MED ORDER — BENEPROTEIN PO POWD
1.0000 | Freq: Three times a day (TID) | ORAL | Status: DC
  Filled 2020-09-09: qty 227

## 2020-09-09 MED ORDER — FAMOTIDINE 20 MG PO TABS
20.0000 mg | ORAL_TABLET | Freq: Every day | ORAL | Status: DC
  Administered 2020-09-10 – 2020-09-14 (×5): 20 mg via ORAL
  Filled 2020-09-09 (×6): qty 1

## 2020-09-09 MED ORDER — ALBUTEROL SULFATE (2.5 MG/3ML) 0.083% IN NEBU: 2.5000 mg | INHALATION_SOLUTION | RESPIRATORY_TRACT | Status: DC | PRN

## 2020-09-09 MED ORDER — ALUM & MAG HYDROXIDE-SIMETH 200-200-20 MG/5ML PO SUSP
30.0000 mL | ORAL | Status: AC
  Administered 2020-09-09: 30 mL via ORAL
  Filled 2020-09-09: qty 30

## 2020-09-09 MED ORDER — IPRATROPIUM-ALBUTEROL 0.5-2.5 (3) MG/3ML IN SOLN
3.0000 mL | Freq: Two times a day (BID) | RESPIRATORY_TRACT | Status: DC
  Administered 2020-09-09 – 2020-09-12 (×7): 3 mL via RESPIRATORY_TRACT
  Filled 2020-09-09 (×9): qty 3

## 2020-09-09 MED ORDER — SODIUM CHLORIDE 0.9 % IN NEBU
3.0000 mL | INHALATION_SOLUTION | Freq: Two times a day (BID) | RESPIRATORY_TRACT | Status: DC
  Administered 2020-09-09 – 2020-09-12 (×5): 3 mL via RESPIRATORY_TRACT
  Filled 2020-09-09 (×7): qty 3

## 2020-09-09 MED ORDER — CEPHALEXIN 500 MG PO CAPS
500.0000 mg | ORAL_CAPSULE | Freq: Three times a day (TID) | ORAL | Status: DC
  Administered 2020-09-09 – 2020-09-14 (×15): 500 mg via ORAL
  Filled 2020-09-09 (×15): qty 1

## 2020-09-09 NOTE — TOC Progression Note (Signed)
Transition of Care [facility]) - Progression Note    Patient Details  Name: [name] MRN: [id number] Date of Birth: Mar 21, 1951  Transition of Care [facility]) CM/SW [place], [place] Phone Number: 09/09/2020, 4:01 PM  Clinical Narrative:  Patient has one bed offer-[facility].  They are working on [employer] fast streamer as of 2:30 this afternoon. TOC will continue to follow during the course of hospitalization.      Expected Discharge Plan: Skilled Nursing Facility Barriers to Discharge: Other (comment) (insuranc Josem Kaufmann)  Expected Discharge Plan and Services Expected Discharge Plan: [place]   Discharge Planning Services: CM Consult   Living arrangements for the past 2 months: Apartment                                       Social Determinants of Health (SDOH) Interventions    Readmission Risk Interventions No flowsheet data found.

## 2020-09-09 NOTE — Progress Notes (Signed)
PROGRESS NOTE  [name] TOI:[id number] DOB: Apr 01, 1951 DOA: 08/28/2020 PCP: Nicholes Rough, PA-C  HPI/Recap of past 24 hours: PCCM to [facility] transfer 09/06/2020: 69 year old female with history of hiatal hernia, Schatzki's ring, hyponatremia, tobacco abuse, suspected SIADH, who was found down at home by her brother after she was last known normal the day PTA.  When EMS arrived, she was hypoxic in the sixties and had difficulty breathing.  She was placed on NRB, and when assessed in the ED, decision was made to intubate.  She was admitted to ICU for acute respiratory failure with hypoxia due to suspected aspiration pneumonia versus CAP, acute metabolic encephalopathy and acute kidney injury.  Extubated 10/23 and transferred to telemetry 09/06/20.  Due to persistent tachypnea, PCCM reevaluated on 09/06/20. Patient lives alone, manages her own meds. She has had substantial weight loss over the last 1 year.  Elevate BNP>3000 with bilateral pleural effusions R>L.  Post right thoracentesis by IR with greater than 500 cc removed.  Started on IV Lasix 20 mg every 6 hours on 09/07/2020.  2D echo ordered and pending.  09/09/20: Feels better this morning, left foot is tender and erythematous, suspect left foot cellulitis.   Assessment/Plan: Active Problems:   Hyponatremia   Acute and chr resp failure, unsp w hypoxia or hypercapnia (HCC)   Malnutrition of moderate degree   Hiatal hernia   Acute respiratory failure (HCC)   Palliative care by specialist   Goals of care, counseling/discussion   Advanced care planning/counseling discussion  Improving acute hypoxic hypercarbic respiratory failure likely multifactorial secondary to bilateral pleural effusion, mild pulmonary edema, suspected aspiration versus treated community-acquired pneumonia She presented from home severely hypoxic, was intubated on presentation 08/28/2020 and extubated on 09/03/2020. Not on oxygen supplementation at baseline  currently on 4 L with O2 saturation 100%. Last ABG done on 09/06/2020 on 3 L nasal cannula showed pH 7.356/54/132 Continue to avoid narcotics.  Home O2 evaluation for DC planning Wean off oxygen as tolerated.  Acute diastolic CHF Bilateral pleural effusion, pulmonary edema, BNP greater than 3000 Abnormal 2D echo done on 09/08/2020 showed regional wall motion abnormalities with basal inferior and basal to mid inferior lateral akinesis.  Left ventricular diastolic parameters are consistent with grade 1 diastolic dysfunction.  Left atrial size was moderately dilated.  Moderate mitral annular calcification. Continue IV diuretics Start strict I's and O's and daily weight Cardiology consult  Left foot cellulitis WBC 21.5 Started on Keflex 500 mg 3 times daily x10 days Obtain blood cultures x2 peripherally Demarcate cellulitic lesion.  Bilateral pleural effusions right greater than left, mild pulmonary edema, likely cardiogenic, post right thoracentesis by IR with greater than 500 cc removed BNP greater than 3000 on 09/07/2020 Continue IV Lasix 20 mg every 6 hours. Continue to monitor volume status and urine output. Positive net I&O, continue to monitor  Elevated liver chemistries, improving Trend alkaline phosphatase, AST and ALT LFTs are downtrending Continue to avoid hepatotoxic agents  Essential hypertension BP stable Continue home regimen  Severe protein calorie malnutrition BMI 20 Albumin 1.9 Severe muscle mass loss Encourage increase in oral protein calorie intake Oral supplement  Dysphagia, improved Seen by speech therapist with recommendation for regular consistency diet and thin liquid Continue aspiration precaution  Physical debility PT assessment recommended SNF Continue PT OT with assistance of all the questions TOC assisting with SNF placement.  Goals of care Palliative care team following and assisting with establishing goals of care Currently she is  DNR PMT:DNR with  the caveat that patient is agreeable to ventilator if needed if her problem is respiratory only. -Continue current scope of care -No feeding tube -Kaysen is agreeable to SNF placement rehab short-term but but would not want long-term.     Code Status: DNR  Family Communication: None at bedside  Disposition Plan: SNF   Consultants:  PCCM  Palliative care medicine  Procedures: Intubation Extubation  Antimicrobials:  None.  DVT prophylaxis: Subcu Lovenox daily  Status is: Inpatient    Dispo: The patient is from: Home.              Anticipated d/c is to: SNF.               Anticipated d/c date is: 09/10/2020.              Patient currently not stable for discharge due to ongoing evaluation and management of left foot cellulitis.        Objective: Vitals:   09/09/20 0853 09/09/20 1005 09/09/20 1353 09/09/20 1755  BP:  112/74 (!) 105/57 126/72  Pulse: 90 99 89 90  Resp: (!) 22 20 (!) 22 19  Temp:  99.2 F (37.3 C) 99.4 F (37.4 C) 99.5 F (37.5 C)  TempSrc:  Oral    SpO2: 100% 97% 93% 93%  Weight:      Height:        Intake/Output Summary (Last 24 hours) at 09/09/2020 1801 Last data filed at 09/09/2020 1500 Gross per 24 hour  Intake 873.6 ml  Output 2750 ml  Net -1876.4 ml   Filed Weights   09/07/20 0553 09/08/20 0420 09/09/20 0358  Weight: 49.8 kg 48.2 kg 46.6 kg    Exam:  . General: 69 y.o. year-old female pleasant in no acute distress.  Alert and oriented x3.   . Cardiovascular: Regular rate and rhythm no rubs or gallops. Marland Kitchen Respiratory: Clear to auscultation no wheezes or rales.  Good inspiratory effort.  . Abdomen: Soft nontender normal bowel sounds present.  . Musculoskeletal: Trace lower extremity edema bilaterally.  Left foot dorsal region erythematous, tender and warm.   Marland Kitchen Psychiatry: Mood is appropriate for condition and setting.  Data Reviewed: CBC: Recent Labs  Lab 09/03/20 0500 09/03/20 0500  09/05/20 0430 09/05/20 0430 09/05/20 0547 09/07/20 0230 09/07/20 1701 09/08/20 0758 09/09/20 0843  WBC 8.4   < > 13.8*   < > 15.4* 21.0* 22.6* 19.6* 21.5*  NEUTROABS 6.4  --  11.2*  --   --   --  19.5*  --  19.0*  HGB 9.1*   < > 9.2*   < > 9.7* 9.3* 8.9* 8.7* 7.9*  HCT 27.7*   < > 29.1*   < > 30.5* 28.9* 27.7* 26.0* 23.5*  MCV 90.2   < > 97.7   < > 94.4 93.8 94.5 92.9 93.3  PLT 186   < > 255   < > 253 348 359 358 369   < > = values in this interval not displayed.   Basic Metabolic Panel: Recent Labs  Lab 09/05/20 0430 09/05/20 0430 09/05/20 0547 09/05/20 0547 09/06/20 0500 09/07/20 0230 09/08/20 0436 09/08/20 1902 09/09/20 1430  NA 135   < > 142   < > 141 140 136 134* 131*  K 6.8*   < > 4.0   < > 4.3 4.5 4.1 4.0 3.6  CL 104   < > 107   < > 106 104 100 94* 90*  CO2 27   < >  29   < > 30 31 31  32 34*  GLUCOSE 797*   < > 137*   < > 129* 127* 114* 102* 192*  BUN 21   < > 23   < > 24* 28* 28* 27* 28*  CREATININE 0.61   < > 0.51   < > 0.46 0.52 0.45 0.52 0.61  CALCIUM 7.9*   < > 8.1*   < > 8.1* 8.2* 7.7* 7.5* 7.4*  MG 2.3  --  1.9  --  2.0 2.1 2.0  --   --   PHOS 5.0*  --  2.3*  --  3.2 3.3 3.4  --   --    < > = values in this interval not displayed.   GFR: Estimated Creatinine Clearance: 50.2 mL/min (by C-G formula based on SCr of 0.61 mg/dL). Liver Function Tests: Recent Labs  Lab 09/05/20 0430 09/05/20 0547 09/07/20 0230 09/08/20 0436 09/09/20 1430  AST 39 45* 48* 37 37  ALT 32 40 48* 38 36  ALKPHOS 140* 156* 213* 155* 138*  BILITOT <0.1* 0.3 0.3 0.4 0.4  PROT 4.2* 4.8* 4.9* 4.2* 4.5*  ALBUMIN 2.0* 2.2* 2.2* 1.9* 1.9*   No results for input(s): LIPASE, AMYLASE in the last 168 hours. No results for input(s): AMMONIA in the last 168 hours. Coagulation Profile: No results for input(s): INR, PROTIME in the last 168 hours. Cardiac Enzymes: No results for input(s): CKTOTAL, CKMB, CKMBINDEX, TROPONINI in the last 168 hours. BNP (last 3 results) No results for  input(s): PROBNP in the last 8760 hours. HbA1C: No results for input(s): HGBA1C in the last 72 hours. CBG: Recent Labs  Lab 09/08/20 1647 09/08/20 1718 09/08/20 1827 09/08/20 1832 09/08/20 2009  GLUCAP 43* 68* 32* 41* 40*   Lipid Profile: No results for input(s): CHOL, HDL, LDLCALC, TRIG, CHOLHDL, LDLDIRECT in the last 72 hours. Thyroid Function Tests: Recent Labs    09/07/20 0230  TSH 1.536   Anemia Panel: No results for input(s): VITAMINB12, FOLATE, FERRITIN, TIBC, IRON, RETICCTPCT in the last 72 hours. Urine analysis:    Component Value Date/Time   COLORURINE YELLOW 08/28/2020 2022   APPEARANCEUR HAZY (A) 08/28/2020 2022   LABSPEC 1.012 08/28/2020 2022   PHURINE 5.0 08/28/2020 2022   GLUCOSEU 50 (A) 08/28/2020 2022   HGBUR MODERATE (A) 08/28/2020 2022   BILIRUBINUR NEGATIVE 08/28/2020 2022   KETONESUR 5 (A) 08/28/2020 2022   PROTEINUR 30 (A) 08/28/2020 2022   UROBILINOGEN 0.2 05/25/2011 1337   NITRITE NEGATIVE 08/28/2020 2022   LEUKOCYTESUR TRACE (A) 08/28/2020 2022   Sepsis Labs: @LABRCNTIP (procalcitonin:4,lacticidven:4)  ) Recent Results (from the past 240 hour(s))  Body fluid culture     Status: None (Preliminary result)   Collection Time: 09/07/20  3:57 PM   Specimen: PATH Cytology Pleural fluid  Result Value Ref Range Status   Specimen Description   Final    PLEURAL Performed at [facility], [place] [address] [place], [place] [address]    Special Requests   Final    NONE Performed at [facility], [place] [address] [place], [place] [address]    Gram Stain   Final    WBC PRESENT,BOTH PMN AND MONONUCLEAR NO ORGANISMS SEEN CYTOSPIN SMEAR    Culture   Final    NO GROWTH 2 DAYS Performed at [facility], [place] [address] [place], [place] [address]    Report Status PENDING  Incomplete      Studies: No results found.  Scheduled Meds: . arformoterol  15 mcg Nebulization BID  . budesonide (PULMICORT)  nebulizer solution  0.25 mg Nebulization BID  . cephALEXin  500 mg Oral Q8H  . chlorhexidine  15 mL Mouth Rinse BID  . Chlorhexidine Gluconate Cloth  6 each Topical Daily  . enoxaparin (LOVENOX) injection  30 mg Subcutaneous Q24H  . [START ON 09/10/2020] famotidine  20 mg Oral Daily  . feeding supplement (KATE FARMS STANDARD 1.4)  325 mL Oral BID BM  . furosemide  20 mg Intravenous Q6H  . gabapentin  100 mg Oral BID  . ipratropium-albuterol  3 mL Nebulization BID  . mouth rinse  15 mL Mouth Rinse q12n4p  . metoprolol tartrate  25 mg Oral BID  . nicotine  21 mg Transdermal Daily  . pantoprazole  40 mg Oral Daily  . potassium chloride  20 mEq Oral Daily  . protein supplement  6 g Oral TID WC  . sodium chloride  3 mL Nebulization BID    Continuous Infusions:    LOS: 12 days     Kayleen Memos, MD [facility] Pager [phone]  If 7PM-7AM, please contact night-coverage [email or web address] Password [email or web address] 09/09/2020, 6:01 PM

## 2020-09-09 NOTE — Progress Notes (Signed)
PHARMACY - TOTAL PARENTERAL NUTRITION CONSULT NOTE   Indication: chronic intolerance to enteral feeding  Patient Measurements: Height: _0  (157.5 cm) Weight: 46.6 kg (102 lb 11.8 oz) IBW/kg (Calculated) : 50.1 TPN AdjBW (KG): 36.3 Body mass index is 18.79 kg/m.  Assessment: 69 yo F found down at home by brother. Pharmacy consulted 10/22 to start TPN for nutrition. She was intubated 10/17-10/23 and NGT placement unsuccessful secondary to hiatal hernia.   PMH: large hiatal hernia, schatski ring, hyponatremia, smoker   Glucose / Insulin: no hx DM, CBG goal 100-150. BG readings in ~40's determined to be inaccurate. CBG monitoring transitioned to [place] and are not hypoglycemic per RN - SSI discontinued 10/28 Electrolytes: All lytes WNL including CorrCa (9.4) Renal: AKI resolved, SCr WNL/stable.  BUN slightly elevated Hepatic: Alk Phos trending down, AST/ALT WNL; Tbili WNL Prealbumin: 5.5 (10/23) > 7.4 (10/25). Alb low Triglycerides: 115, WNL (10/25) I/O: MIVF: None. LR stopped 10/25 AM GI Imaging: Surgeries / Procedures:   Significant Events: -PICC line leaking requiring replacement on 10/26, brief TPN interruption -10/26: Goal rate increased from 55 mL/hr --> 65 mL/hr based on revised goals  Central access: PICC placed 10/22 for TPN, replaced 10/26 TPN start date: 10/22  Nutritional Goals (per RD recommendation 10/25): kCal: 1610-1840, Protein: 75-85 grams, Fluid: >/= 1.6 L/day  Goal TPN rate is 65 mL/hr (provides 84 g of protein and 1617 kcal per day)  Current Nutrition:  -Regular diet. 15% meal intake charted -[employer] 1.4, Magic Cup BID ordered per dietician  -TPN at half-rate  Plan:  Per discussion with MD, will wean off of TPN today.  TPN already reduced to 1/2 of goal rate. Will expire at 1800 this evening. Discontinue TPN labs and nursing orders.  Pharmacy to sign off, please re-consult if needed.  Lenis Noon, PharmD 09/09/20 9:20 AM

## 2020-09-09 NOTE — Progress Notes (Addendum)
Nutrition Follow-up  DOCUMENTATION CODES:   Non-severe (moderate) malnutrition in context of chronic illness, Underweight  INTERVENTION:   -TPN management per Pharmacy (weaning off today)  -Anda [employer] 1.4 PO BID, each provides 455 kcals and 20g protein -Magic cup BID with meals, each supplement provides 290 kcal and 9 grams of protein -Beneprotein powder TID with meals, each provides 25 kcals and 6g protein  NUTRITION DIAGNOSIS:   Moderate Malnutrition related to chronic illness (chronic hyponatremia associated with lightheadedness and vomiting) as evidenced by moderate fat depletion, moderate muscle depletion, severe muscle depletion.  Ongoing.  GOAL:   Patient will meet greater than or equal to 90% of their needs  Progressing.  MONITOR:   Diet advancement, Labs, Weight trends, Other (Comment) (TPN regimen)  ASSESSMENT:   69 year-old female with medical history of large hiatal hernia, schatski ring, and hyponatremia. She was found down at home by her brother after she was last known normal 10/16. When EMS arrived, her O2 sats were in the 26s and she was having difficulty breathing. She was intubated in the ED on 10/17. CXR showed right sided atelectasis and likely infiltrate. In the ED, her brother reported that she has had frequent admissions for lightheadedness and confusion. 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 She has had a poor appetite and has episodes of dry heaving and vomiting, usually when she has low Na and low K. Brother reported significant weight loss over the past 1 year.  10/17: admitted d/t suspected aspiration pneumonia 10/22: PICC placed, TPN initiated 10/23: extubated 10/25: MBS, SLP recommends dysphagia 3 diet 10/26: PICC replaced, approved for regular diet  Currently pt is consuming 15-25% of meals. Pt is accepting [employer] supplements. Will continue as well as Magic cups. Will add Beneprotein powder to add to pudding or applesauce for  additional protein. With supplements alone (Magic cup, [employer] and Beneprotein) pt would meet >90% of estimated needs.  Per Pharmacy note, TPN currently at 40 ml/hr, will stop at 1800 today.  Per Palliative care note 10/27, pt does not want feeding tube per GOC.  Admission weight: 80 lbs. Current weight: 102 lbs.  I/Os: +10.7L since admit UOP: 1.8L x 24 hrs  Medications: KLOR-CON, IV Lasix  Labs reviewed: CBGs: 40-41 -deemed inaccurate Low Na  Diet Order:   Diet Order            Diet regular Room service appropriate? Yes; Fluid consistency: Thin  Diet effective now                 EDUCATION NEEDS:   No education needs have been identified at this time  Skin:  Skin Assessment: Reviewed RN Assessment  Last BM:  10/24 (type 7)  Height:   Ht Readings from Last 1 Encounters:  08/28/20 5\' 2"  (1.575 m)    Weight:   Wt Readings from Last 1 Encounters:  09/09/20 46.6 kg    BMI:  Body mass index is 18.79 kg/m.  Estimated Nutritional Needs:   Kcal:  1610-1840 kcal  Protein:  75-85 grams  Fluid:  >/= 1.6 L/day  Clayton Bibles, MS, RD, LDN Inpatient Clinical Dietitian Contact information available via Amion

## 2020-09-09 NOTE — Progress Notes (Signed)
Daily Progress Note   Patient Name: [name] [facility]       Date: 09/09/2020 DOB: 11/24/1950  Age: 69 y.o. MRN#: [id number] Attending Physician: Kayleen Memos, DO Primary Care Physician: Nicholes Rough, PA-C Admit Date: 08/28/2020  Reason for Consultation/Follow-up: Establishing goals of care  Subjective: [name] is more alert today- reports she is feeling better. Breathing is improved on room air now. She is having significant, painful, reflux.  Qiara did not have any questions or concerns regarding our meeting today. She continues to desire to continue aggressive life-prolonging medical care with limit set at DNR, no feeding tube.    Length of Stay: 12  Current Medications: Scheduled Meds:  . arformoterol  15 mcg Nebulization BID  . budesonide (PULMICORT) nebulizer solution  0.25 mg Nebulization BID  . cephALEXin  500 mg Oral Q8H  . chlorhexidine  15 mL Mouth Rinse BID  . Chlorhexidine Gluconate Cloth  6 each Topical Daily  . enoxaparin (LOVENOX) injection  30 mg Subcutaneous Q24H  . [START ON 09/10/2020] famotidine  20 mg Oral Daily  . feeding supplement (KATE [employer]ARMS STANDARD 1.4)  325 mL Oral BID BM  . furosemide  20 mg Intravenous Q6H  . gabapentin  100 mg Oral BID  . ipratropium-albuterol  3 mL Nebulization BID  . mouth rinse  15 mL Mouth Rinse q12n4p  . metoprolol tartrate  25 mg Oral BID  . nicotine  21 mg Transdermal Daily  . pantoprazole  40 mg Oral Daily  . potassium chloride  20 mEq Oral Daily  . protein supplement  6 g Oral TID WC  . sodium chloride  3 mL Nebulization BID    Continuous Infusions: . TPN ADULT (ION) 40 mL/hr at 09/08/20 2045    PRN Meds: acetaminophen, albuterol, docusate, metoprolol tartrate, morphine injection, polyethylene glycol, sodium chloride  flush, traMADol       Vital Signs: BP (!) 105/57 (BP Location: Right Arm)   Pulse 89   Temp 99.4 F (37.4 C)   Resp (!) 22   Ht 5\' 2"  (1.575 m)   Wt 46.6 kg   SpO2 93%   BMI 18.79 kg/m  SpO2: SpO2: 93 % O2 Device: O2 Device: Room Air O2 Flow Rate: O2 Flow Rate (L/min): 0 L/min  Intake/output summary:   Intake/Output Summary (Last 24  hours) at 09/09/2020 1408 Last data filed at 09/09/2020 1355 Gross per 24 hour  Intake 30 ml  Output 3250 ml  Net -3220 ml   LBM: Last BM Date: 09/08/20 Baseline Weight: Weight: 36.3 kg Most recent weight: Weight: 46.6 kg       Palliative Assessment/Data: PPS: 20%      Patient Active Problem List   Diagnosis Date Noted  . Acute respiratory failure ([place])   . Palliative care by specialist   . Goals of care, counseling/discussion   . Advanced care planning/counseling discussion   . Hiatal hernia   . Malnutrition of moderate degree 08/29/2020  . Respiratory failure ([place])   . Community acquired pneumonia of right lower lobe of lung   . Somnolence   . Failure to thrive in adult   . Acute and chr resp failure, unsp w hypoxia or hypercapnia ([place]) 08/28/2020  . Generalized weakness 05/19/2020  . Hypokalemia 05/19/2020  . Hyponatremia 02/03/2020  . Ambulatory dysfunction   . Prediabetes 03/23/2013  . Hyperlipidemia 03/23/2013  . Colon cancer screening 11/18/2012  . Hypertension 09/16/2012  . GERD (gastroesophageal reflux disease) 09/16/2012  . Tobacco use 09/16/2012    Palliative Care Assessment & Plan   Patient Profile:  69 y.o. female  with past medical history of hyponatremia of unknown origin presumed to be due to HCTZ which patient continues to take despite being told not to, hyperkalemia, or malnutrition, hiatal hernia with Schatzki's ring-not amenable to surgery admitted on 08/28/2020 from home after being found down by her brother.  When EMS arrived her O2 sats were in the 11s.  She was intubated in the ED, she was extubated  on 1023.  Chest x-ray showed atelectasis, likely pulmonary interstitial pulmonary edema, and pneumonia questionable aspiration versus HCAP.  SLP has been consulted and she has been placed on a dysphagia 3 diet with thin liquids.  She is now requiring TPN nutrition.  NG tube was attempted to be placed but unable due to hiatal hernia.  Her respiratory status continues to be tenuous with tachypnea.  Palliative medicine consulted for goals of care.  Assessment/Recommendations/Plan   Some improvement today  PMT will continue to follow  Noted PPI ordered for reflux- will add one time order for mylanta for acute reflux symptoms  Goals of Care and Additional Recommendations:  Limitations on Scope of Treatment: Full Scope Treatment  Code Status:  DNR  Prognosis:   Unable to determine  Discharge Planning:  To Be Determined  Care plan was discussed with patient.   Thank you for allowing the Palliative Medicine Team to assist in the care of this patient.   Total time: 38 mins Greater than 50%  of this time was spent counseling and coordinating care related to the above assessment and plan.  Mariana Kaufman, AGNP-C Palliative Medicine   Please contact Palliative Medicine Team phone at [phone] for questions and concerns.

## 2020-09-09 NOTE — Progress Notes (Signed)
   09/09/20 0600  Assess: MEWS Score  Temp 98.7 F (37.1 C)  BP (!) 99/55  Pulse Rate 83  ECG Heart Rate 86  Resp (!) 22  Level of Consciousness Alert  SpO2 100 %  O2 Device HFNC  Patient Activity (if Appropriate) In bed  O2 Flow Rate (L/min) 5 L/min  Assess: MEWS Score  MEWS Temp 0  MEWS Systolic 1  MEWS Pulse 0  MEWS RR 1  MEWS LOC 0  MEWS Score 2  MEWS Score Color Yellow  Assess: if the MEWS score is Yellow or Red  Were vital signs taken at a resting state? Yes  Focused Assessment No change from prior assessment  Early Detection of Sepsis Score *See Row Information* Low  MEWS guidelines implemented *See Row Information* Yes  Treat  MEWS Interventions Other (Comment)  Pain Scale 0-10  Pain Score Asleep  Take Vital Signs  Increase Vital Sign Frequency  Yellow: Q 2hr X 2 then Q 4hr X 2, if remains yellow, continue Q 4hrs  Escalate  MEWS: Escalate Yellow: discuss with charge nurse/RN and consider discussing with provider and RRT  Notify: Charge Nurse/RN  Name of Charge Nurse/RN Notified Debbie RN  Date Charge Nurse/RN Notified 09/09/20  Time Charge Nurse/RN Notified 0600  Notify: Provider  Provider Name/Title M. Sharlet Salina NP  Date Provider Notified 09/09/20  Time Provider Notified [phone]  Notification Type Page  Notification Reason Change in status

## 2020-09-09 NOTE — Progress Notes (Signed)
PT Cancellation Note  Patient Details Name: [name] MRN: [id number] DOB: Nov 23, 1950   Cancelled Treatment:      Pt stated her L foot was hurting too much to get OOB.  Upon inspection noted redness and swelling.  Pt stated "they know about it".     Rica Koyanagi  PTA Acute  Rehabilitation Services Pager      [phone] Office      [phone]

## 2020-09-09 NOTE — Progress Notes (Signed)
Pt obtains adequate pain control with 2mg  MSO4 for her left foot ankle pain. That foot/ankle is red and warm to touch.  Pt is asking to remove continuous pulse ox for the night as it keeps beeping at her. O2 sats drop <= to 85% on RA.  With deep breathing the O2 sat will rise to 90%. Will continue to monitor.

## 2020-09-09 NOTE — Progress Notes (Signed)
Pt requests for DNR and Fall risk bracelet to be removed. Pt educated on the importance of each bracelet but continued to insist that they be removed. Bracelets were removed and placed on pt's bed.

## 2020-09-10 LAB — CBC WITH DIFFERENTIAL/PLATELET
Abs Immature Granulocytes: 0.21 10*3/uL — ABNORMAL HIGH (ref 0.00–0.07)
Basophils Absolute: 0.1 10*3/uL (ref 0.0–0.1)
Basophils Relative: 0 %
Eosinophils Absolute: 0 10*3/uL (ref 0.0–0.5)
Eosinophils Relative: 0 %
HCT: 27.8 % — ABNORMAL LOW (ref 36.0–46.0)
Hemoglobin: 9.4 g/dL — ABNORMAL LOW (ref 12.0–15.0)
Immature Granulocytes: 1 %
Lymphocytes Relative: 9 %
Lymphs Abs: 1.9 10*3/uL (ref 0.7–4.0)
MCH: 30.6 pg (ref 26.0–34.0)
MCHC: 33.8 g/dL (ref 30.0–36.0)
MCV: 90.6 fL (ref 80.0–100.0)
Monocytes Absolute: 1.4 10*3/uL — ABNORMAL HIGH (ref 0.1–1.0)
Monocytes Relative: 6 %
Neutro Abs: 18.5 10*3/uL — ABNORMAL HIGH (ref 1.7–7.7)
Neutrophils Relative %: 84 %
Platelets: 408 10*3/uL — ABNORMAL HIGH (ref 150–400)
RBC: 3.07 MIL/uL — ABNORMAL LOW (ref 3.87–5.11)
RDW: 14.9 % (ref 11.5–15.5)
WBC: 22.1 10*3/uL — ABNORMAL HIGH (ref 4.0–10.5)
nRBC: 0 % (ref 0.0–0.2)

## 2020-09-10 LAB — BASIC METABOLIC PANEL
Anion gap: 10 (ref 5–15)
BUN: 30 mg/dL — ABNORMAL HIGH (ref 8–23)
CO2: 35 mmol/L — ABNORMAL HIGH (ref 22–32)
Calcium: 7.6 mg/dL — ABNORMAL LOW (ref 8.9–10.3)
Chloride: 86 mmol/L — ABNORMAL LOW (ref 98–111)
Creatinine, Ser: 0.68 mg/dL (ref 0.44–1.00)
GFR, Estimated: >60 mL/min (ref >60)
Glucose, Bld: 102 mg/dL — ABNORMAL HIGH (ref 70–99)
Potassium: 3.6 mmol/L (ref 3.5–5.1)
Sodium: 131 mmol/L — ABNORMAL LOW (ref 135–145)

## 2020-09-10 LAB — BODY FLUID CULTURE: Culture: NO GROWTH

## 2020-09-10 LAB — MRSA PCR SCREENING: MRSA by PCR: NEGATIVE

## 2020-09-10 MED ORDER — ALUM & MAG HYDROXIDE-SIMETH 200-200-20 MG/5ML PO SUSP
30.0000 mL | Freq: Once | ORAL | Status: AC
  Filled 2020-09-10: qty 30

## 2020-09-10 MED ORDER — PANTOPRAZOLE SODIUM 40 MG PO TBEC
40.0000 mg | DELAYED_RELEASE_TABLET | Freq: Two times a day (BID) | ORAL | Status: DC
  Administered 2020-09-10 – 2020-09-14 (×9): 40 mg via ORAL
  Filled 2020-09-10 (×10): qty 1

## 2020-09-10 MED ORDER — LIDOCAINE VISCOUS HCL 2 % MT SOLN
15.0000 mL | Freq: Once | OROMUCOSAL | Status: AC
  Administered 2020-09-10: 15 mL via ORAL
  Filled 2020-09-10: qty 15

## 2020-09-10 NOTE — Progress Notes (Signed)
   Consult requested. Dr Radford Pax aware and will see in am.   Rosaria Ferries, PA-C 09/10/2020 10:23 AM

## 2020-09-10 NOTE — Progress Notes (Signed)
PROGRESS NOTE  [name] EXN:[id number] DOB: 12/25/1950 DOA: 08/28/2020 PCP: Nicholes Rough, PA-C  HPI/Recap of past 24 hours: PCCM to [facility] transfer 09/06/2020: 69 year old female with history of hiatal hernia, Schatzki's ring, hyponatremia, tobacco abuse, suspected SIADH, who was found down at home by her brother after she was last known normal the day PTA.  When EMS arrived, she was hypoxic in the sixties and had difficulty breathing.  She was placed on NRB, and when assessed in the ED, decision was made to intubate.  She was admitted to ICU for acute respiratory failure with hypoxia due to suspected aspiration pneumonia versus CAP, acute metabolic encephalopathy and acute kidney injury.  Extubated 10/23 and transferred to telemetry 09/06/20.  Due to persistent tachypnea, PCCM reevaluated on 09/06/20. Patient lives alone, manages her own meds. She has had substantial weight loss over the last 1 year.  Elevate BNP>3000 with bilateral pleural effusions R>L.  Post right thoracentesis by IR with greater than 500 cc removed.  Started on IV Lasix 20 mg every 6 hours on 09/07/2020.  2D echo ordered and pending.  09/10/20: She feels better this morning, her left foot cellulitis pain is improving, on antibiotics and as needed analgesics.   Assessment/Plan: Active Problems:   Hyponatremia   Acute and chr resp failure, unsp w hypoxia or hypercapnia (HCC)   Malnutrition of moderate degree   Hiatal hernia   Acute respiratory failure (HCC)   Palliative care by specialist   Goals of care, counseling/discussion   Advanced care planning/counseling discussion  Resolved acute hypoxic hypercarbic respiratory failure likely multifactorial secondary to bilateral pleural effusion, mild pulmonary edema, suspected aspiration versus treated community-acquired pneumonia She presented from home severely hypoxic, was intubated on presentation 08/28/2020 and extubated on 09/03/2020. Not on oxygen supplementation  at baseline  Her hypoxemia has resolved, O2 saturation 94% on room air. Latest ABG with FiO2 44, 7.463/48.1/131 Home O2 evaluation prior to DC  Acute diastolic CHF Bilateral pleural effusion, pulmonary edema, BNP greater than 3000 Abnormal 2D echo done on 09/08/2020 showed regional wall motion abnormalities with basal inferior and basal to mid inferior lateral akinesis.  Left ventricular diastolic parameters are consistent with grade 1 diastolic dysfunction.  Left atrial size was moderately dilated.  Moderate mitral annular calcification. Ongoing diuresing, 20 mg IV Lasix every 6 hours Net I&O +9.4L unclear if accurate C/w strict I's and O's and daily weight Cardiology consult  Left foot cellulitis WBC 21.5 C/w Keflex 500 mg 3 times daily day 2/10 days Follow blood cultures x2 peripherally Demarcate cellulitic lesion to monitor improvement. MRSA screen Monitor fever curve and wbc  GERD PPI BID Last EGD 03/22/2015 w rec to take daily PPI C/w close monitoring  Resolving Bilateral pleural effusions right greater than left, mild pulmonary edema, likely cardiogenic, post right thoracentesis R thoracentesis by IR with greater than 500 cc removed BNP greater than 3000 on 09/07/2020 Continue IV Lasix 20 mg every 6 hours. Continue to monitor volume status and urine output.  Resolving Elevated liver chemistries, improving Trend alkaline phosphatase, AST and ALT LFTs are downtrending Continue to avoid hepatotoxic agents  Essential hypertension BP is at goal  Severe protein calorie malnutrition BMI 20 Albumin 1.9 Severe muscle mass loss Encourage increase in oral protein calorie intake Oral supplement Off TPN since 09/10/20  Dysphagia, improved Seen by speech therapist with recommendation for regular consistency diet and thin liquid Continue aspiration precaution  Physical debility PT assessment recommended SNF Continue PT OT with assistance of all  the questions TOC  assisting with SNF placement.  Goals of care Palliative care team following and assisting with establishing goals of care Currently she is DNR PMT:DNR with the caveat that patient is agreeable to ventilator if needed if her problem is respiratory only. -Continue current scope of care -No feeding tube -[name] is agreeable to SNF placement rehab short-term but but would not want long-term.     Code Status: DNR  Family Communication: None at bedside  Disposition Plan: SNF   Consultants:  PCCM  Palliative care medicine  Procedures: Intubation Extubation  Antimicrobials:  Keflex 09/09/20>>>  DVT prophylaxis: Subcu Lovenox daily  Status is: Inpatient    Dispo: The patient is from: Home.              Anticipated d/c is to: SNF.               Anticipated d/c date is: 09/11/2020.              Patient currently not stable for discharge due to ongoing evaluation and management of left foot cellulitis and abnormal 2D echo.        Objective: Vitals:   09/10/20 0427 09/10/20 0431 09/10/20 0734 09/10/20 1323  BP:  107/67  127/70  Pulse:  80  82  Resp:  20  14  Temp:  98.5 F (36.9 C)  98.8 F (37.1 C)  TempSrc:  Oral  Oral  SpO2:  92% 94% 90%  Weight: 45.6 kg     Height:        Intake/Output Summary (Last 24 hours) at 09/10/2020 1613 Last data filed at 09/10/2020 0900 Gross per 24 hour  Intake 100 ml  Output 1850 ml  Net -1750 ml   Filed Weights   09/08/20 0420 09/09/20 0358 09/10/20 0427  Weight: 48.2 kg 46.6 kg 45.6 kg    Exam:  . General: 69 y.o. year-old female Chronically ill appearing in NAD. A&O x 3 . Cardiovascular: RRR no rubs or gallops . Respiratory: CTA no wheezes or rales . Abdomen: Soft, non distended normal bowel sounds . Musculoskeletal: Trace LE edema. Left dorsal region erythema, warmth and tenderness with palpation.  Marland Kitchen Psychiatry: Mood is appropriate for condition.  Data Reviewed: CBC: Recent Labs  Lab 09/05/20 0430  09/05/20 0547 09/07/20 0230 09/07/20 1701 09/08/20 0758 09/09/20 0843 09/10/20 0800  WBC 13.8*   < > 21.0* 22.6* 19.6* 21.5* 22.1*  NEUTROABS 11.2*  --   --  19.5*  --  19.0* 18.5*  HGB 9.2*   < > 9.3* 8.9* 8.7* 7.9* 9.4*  HCT 29.1*   < > 28.9* 27.7* 26.0* 23.5* 27.8*  MCV 97.7   < > 93.8 94.5 92.9 93.3 90.6  PLT 255   < > 348 359 358 369 408*   < > = values in this interval not displayed.   Basic Metabolic Panel: Recent Labs  Lab 09/05/20 0430 09/05/20 0430 09/05/20 0547 09/05/20 0547 09/06/20 0500 09/06/20 0500 09/07/20 0230 09/08/20 0436 09/08/20 1902 09/09/20 1430 09/10/20 0800  NA 135   < > 142   < > 141   < > 140 136 134* 131* 131*  K 6.8*   < > 4.0   < > 4.3   < > 4.5 4.1 4.0 3.6 3.6  CL 104   < > 107   < > 106   < > 104 100 94* 90* 86*  CO2 27   < > 29   < > 30   < >  31 31 32 34* 35*  GLUCOSE 797*   < > 137*   < > 129*   < > 127* 114* 102* 192* 102*  BUN 21   < > 23   < > 24*   < > 28* 28* 27* 28* 30*  CREATININE 0.61   < > 0.51   < > 0.46   < > 0.52 0.45 0.52 0.61 0.68  CALCIUM 7.9*   < > 8.1*   < > 8.1*   < > 8.2* 7.7* 7.5* 7.4* 7.6*  MG 2.3  --  1.9  --  2.0  --  2.1 2.0  --   --   --   PHOS 5.0*  --  2.3*  --  3.2  --  3.3 3.4  --   --   --    < > = values in this interval not displayed.   GFR: Estimated Creatinine Clearance: 49.1 mL/min (by C-G formula based on SCr of 0.68 mg/dL). Liver Function Tests: Recent Labs  Lab 09/05/20 0430 09/05/20 0547 09/07/20 0230 09/08/20 0436 09/09/20 1430  AST 39 45* 48* 37 37  ALT 32 40 48* 38 36  ALKPHOS 140* 156* 213* 155* 138*  BILITOT <0.1* 0.3 0.3 0.4 0.4  PROT 4.2* 4.8* 4.9* 4.2* 4.5*  ALBUMIN 2.0* 2.2* 2.2* 1.9* 1.9*   No results for input(s): LIPASE, AMYLASE in the last 168 hours. No results for input(s): AMMONIA in the last 168 hours. Coagulation Profile: No results for input(s): INR, PROTIME in the last 168 hours. Cardiac Enzymes: No results for input(s): CKTOTAL, CKMB, CKMBINDEX, TROPONINI in the  last 168 hours. BNP (last 3 results) No results for input(s): PROBNP in the last 8760 hours. HbA1C: No results for input(s): HGBA1C in the last 72 hours. CBG: Recent Labs  Lab 09/08/20 1647 09/08/20 1718 09/08/20 1827 09/08/20 1832 09/08/20 2009  GLUCAP 43* 68* 32* 41* 40*   Lipid Profile: No results for input(s): CHOL, HDL, LDLCALC, TRIG, CHOLHDL, LDLDIRECT in the last 72 hours. Thyroid Function Tests: No results for input(s): TSH, T4TOTAL, FREET4, T3FREE, THYROIDAB in the last 72 hours. Anemia Panel: No results for input(s): VITAMINB12, FOLATE, FERRITIN, TIBC, IRON, RETICCTPCT in the last 72 hours. Urine analysis:    Component Value Date/Time   COLORURINE YELLOW 08/28/2020 2022   APPEARANCEUR HAZY (A) 08/28/2020 2022   LABSPEC 1.012 08/28/2020 2022   PHURINE 5.0 08/28/2020 2022   GLUCOSEU 50 (A) 08/28/2020 2022   HGBUR MODERATE (A) 08/28/2020 2022   BILIRUBINUR NEGATIVE 08/28/2020 2022   KETONESUR 5 (A) 08/28/2020 2022   PROTEINUR 30 (A) 08/28/2020 2022   UROBILINOGEN 0.2 05/25/2011 1337   NITRITE NEGATIVE 08/28/2020 2022   LEUKOCYTESUR TRACE (A) 08/28/2020 2022   Sepsis Labs: @LABRCNTIP (procalcitonin:4,lacticidven:4)  ) Recent Results (from the past 240 hour(s))  Body fluid culture     Status: None   Collection Time: 09/07/20  3:57 PM   Specimen: PATH Cytology Pleural fluid  Result Value Ref Range Status   Specimen Description   Final    PLEURAL Performed at [facility], [place] [address] [place], [place] [address]    Special Requests   Final    NONE Performed at [facility], [place] [address] [place], [place] [address]    Gram Stain   Final    WBC PRESENT,BOTH PMN AND MONONUCLEAR NO ORGANISMS SEEN CYTOSPIN SMEAR    Culture   Final    NO GROWTH Performed at [facility]  [facility], [address], [place], [place] [address]    Report Status 09/10/2020 FINAL  Final  Culture, blood (routine x 2)     Status: None  (Preliminary result)   Collection Time: 09/10/20  7:58 AM   Specimen: BLOOD  Result Value Ref Range Status   Specimen Description   Final    BLOOD RIGHT ARM Performed at [facility] [address] [place], [place] [address]    Special Requests   Final    BOTTLES DRAWN AEROBIC ONLY Blood Culture adequate volume Performed at [facility] [address] [place], [place] [address]    Culture   Final    NO GROWTH <12 HOURS Performed at [facility] [address], [place], [place] [address]    Report Status PENDING  Incomplete  Culture, blood (routine x 2)     Status: None (Preliminary result)   Collection Time: 09/10/20  7:59 AM   Specimen: BLOOD  Result Value Ref Range Status   Specimen Description   Final    BLOOD RIGHT ARM Performed at [facility] [address], [place], [place] [address]    Special Requests   Final    BOTTLES DRAWN AEROBIC ONLY Blood Culture adequate volume Performed at [facility] [address], [place], [place] [address]    Culture   Final    NO GROWTH <12 HOURS Performed at [facility] [address], [place], [place] [address]    Report Status PENDING  Incomplete  MRSA PCR Screening     Status: None   Collection Time: 09/10/20 11:46 AM   Specimen: Nasopharyngeal Wash  Result Value Ref Range Status   MRSA by PCR NEGATIVE NEGATIVE Final    Comment:        The GeneXpert MRSA Assay (FDA approved for NASAL specimens only), is one component of a comprehensive MRSA colonization surveillance program. It is not intended to diagnose MRSA infection nor to guide or monitor treatment for MRSA infections. Performed at [facility], [place] [address], [place], [place] [address]       Studies: No results found.  Scheduled Meds: . arformoterol  15 mcg Nebulization BID  . budesonide (PULMICORT) nebulizer solution  0.25 mg Nebulization BID    . cephALEXin  500 mg Oral Q8H  . chlorhexidine  15 mL Mouth Rinse BID  . Chlorhexidine Gluconate Cloth  6 each Topical Daily  . enoxaparin (LOVENOX) injection  30 mg Subcutaneous Q24H  . famotidine  20 mg Oral Daily  . feeding supplement ([employer] STANDARD 1.4)  325 mL Oral BID BM  . furosemide  20 mg Intravenous Q6H  . gabapentin  100 mg Oral BID  . ipratropium-albuterol  3 mL Nebulization BID  . mouth rinse  15 mL Mouth Rinse q12n4p  . metoprolol tartrate  25 mg Oral BID  . nicotine  21 mg Transdermal Daily  . pantoprazole  40 mg Oral BID  . potassium chloride  20 mEq Oral Daily  . protein supplement  6 g Oral TID WC  . sodium chloride  3 mL Nebulization BID    Continuous Infusions:    LOS: 13 days     Kayleen Memos, MD [facility] Pager [phone]  If 7PM-7AM, please contact night-coverage [email or web address] Password [email or web address] 09/10/2020, 4:13 PM

## 2020-09-11 ENCOUNTER — Inpatient Hospital Stay (HOSPITAL_COMMUNITY): Payer: Medicare Other

## 2020-09-11 DIAGNOSIS — I5031 Acute diastolic (congestive) heart failure: Secondary | ICD-10-CM

## 2020-09-11 DIAGNOSIS — I10 Essential (primary) hypertension: Secondary | ICD-10-CM

## 2020-09-11 DIAGNOSIS — E871 Hypo-osmolality and hyponatremia: Secondary | ICD-10-CM | POA: Diagnosis not present

## 2020-09-11 DIAGNOSIS — R931 Abnormal findings on diagnostic imaging of heart and coronary circulation: Secondary | ICD-10-CM | POA: Diagnosis not present

## 2020-09-11 DIAGNOSIS — J9620 Acute and chronic respiratory failure, unspecified whether with hypoxia or hypercapnia: Secondary | ICD-10-CM | POA: Diagnosis not present

## 2020-09-11 LAB — BASIC METABOLIC PANEL
Anion gap: 12 (ref 5–15)
BUN: 22 mg/dL (ref 8–23)
CO2: 35 mmol/L — ABNORMAL HIGH (ref 22–32)
Calcium: 7.6 mg/dL — ABNORMAL LOW (ref 8.9–10.3)
Chloride: 83 mmol/L — ABNORMAL LOW (ref 98–111)
Creatinine, Ser: 0.68 mg/dL (ref 0.44–1.00)
GFR, Estimated: >60 mL/min (ref >60)
Glucose, Bld: 102 mg/dL — ABNORMAL HIGH (ref 70–99)
Potassium: 3.3 mmol/L — ABNORMAL LOW (ref 3.5–5.1)
Sodium: 130 mmol/L — ABNORMAL LOW (ref 135–145)

## 2020-09-11 LAB — CBC WITH DIFFERENTIAL/PLATELET
Abs Immature Granulocytes: 0.1 10*3/uL — ABNORMAL HIGH (ref 0.00–0.07)
Basophils Absolute: 0.1 10*3/uL (ref 0.0–0.1)
Basophils Relative: 0 %
Eosinophils Absolute: 0 10*3/uL (ref 0.0–0.5)
Eosinophils Relative: 0 %
HCT: 24.3 % — ABNORMAL LOW (ref 36.0–46.0)
Hemoglobin: 8.3 g/dL — ABNORMAL LOW (ref 12.0–15.0)
Immature Granulocytes: 1 %
Lymphocytes Relative: 12 %
Lymphs Abs: 1.9 10*3/uL (ref 0.7–4.0)
MCH: 30.1 pg (ref 26.0–34.0)
MCHC: 34.2 g/dL (ref 30.0–36.0)
MCV: 88 fL (ref 80.0–100.0)
Monocytes Absolute: 1.5 10*3/uL — ABNORMAL HIGH (ref 0.1–1.0)
Monocytes Relative: 10 %
Neutro Abs: 12.2 10*3/uL — ABNORMAL HIGH (ref 1.7–7.7)
Neutrophils Relative %: 77 %
Platelets: 486 10*3/uL — ABNORMAL HIGH (ref 150–400)
RBC: 2.76 MIL/uL — ABNORMAL LOW (ref 3.87–5.11)
RDW: 14.7 % (ref 11.5–15.5)
WBC: 15.7 10*3/uL — ABNORMAL HIGH (ref 4.0–10.5)
nRBC: 0 % (ref 0.0–0.2)

## 2020-09-11 LAB — BRAIN NATRIURETIC PEPTIDE: B Natriuretic Peptide: 499 pg/mL — ABNORMAL HIGH (ref 0.0–100.0)

## 2020-09-11 LAB — ECHOCARDIOGRAM LIMITED
Height: 62 in
Weight: 1615.53 oz

## 2020-09-11 MED ORDER — FUROSEMIDE 10 MG/ML IJ SOLN
40.0000 mg | Freq: Two times a day (BID) | INTRAMUSCULAR | Status: DC
  Administered 2020-09-11 – 2020-09-13 (×5): 40 mg via INTRAVENOUS
  Filled 2020-09-11 (×5): qty 4

## 2020-09-11 MED ORDER — POTASSIUM CHLORIDE 20 MEQ/15ML (10%) PO SOLN
40.0000 meq | Freq: Two times a day (BID) | ORAL | Status: AC
  Administered 2020-09-11 (×2): 40 meq via ORAL
  Filled 2020-09-11 (×2): qty 30

## 2020-09-11 MED ORDER — PERFLUTREN LIPID MICROSPHERE
1.0000 mL | INTRAVENOUS | Status: AC | PRN
  Administered 2020-09-11: 3 mL via INTRAVENOUS
  Filled 2020-09-11: qty 10

## 2020-09-11 MED ORDER — LORAZEPAM 0.5 MG PO TABS
0.5000 mg | ORAL_TABLET | Freq: Four times a day (QID) | ORAL | Status: DC | PRN
  Administered 2020-09-11 – 2020-09-13 (×9): 0.5 mg via ORAL
  Filled 2020-09-11 (×9): qty 1

## 2020-09-11 NOTE — Consult Note (Addendum)
Cardiology Consultation:   Patient ID: Kennie Karapetian MRN: [id number]; DOB: 1951-03-01  Admit date: 08/28/2020 Date of Consult: 09/11/2020  Primary Care Provider: Nicholes Rough, PA-C [place] Cardiologist: None (NEW) [place] Electrophysiologist:  None    Patient Profile:   Jolina Symonds is a 69 y.o. female with a hx of hiatal hernia, Schatzki's ring, hyponatremia, tobacco abuse, suspected SIADH who is being seen today for the evaluation of CHF and wall motion abnormalities on echo at the request of Francia Greaves, DO.  History of Present Illness:   Ms. Agramonte is a 69 year old female with history of hiatal hernia, Schatzki's ring, hyponatremia, tobacco abuse, suspected SIADH, who was found down at home by her brother after she was last known normal the day PTA. When EMS arrived, she was hypoxic in the sixties and had difficulty breathing. She was placed on NRB, and when assessed in the ED, decision was made to intubate. She was admitted to ICU for acute respiratory failure with hypoxia due to suspected aspiration pneumonia versus CAP with Chest CT showing extensive lower lobe atelectasis with complete lobar collapse on the left. In the dependent right lung is patchy airspace opacity,question underlying aspiration.  Hospitalization complicated by acute metabolic encephalopathy and acute kidney injury. Extubated 10/23 and transferred to telemetry 09/06/20. Due to persistent tachypnea, PCCM reevaluated on 09/06/20. Patient lives alone, manages her own meds. She has had substantial weight loss over the last 1 year.  She was noted to have an elevated BNP>3000 with bilateral pleural effusions R>L.  Post right thoracentesis by IR with greater than 500 cc removed.  Started on IV Lasix 20 mg every 6 hours on 09/07/2020.  2D echo showed normal LVF with EF 55% with basal inferior and basal to mid inferolateral AK, G1DD, moderate LAE.  She has now been weaned off O2 with O2 sats 94% on RA.  She currently is on Lasix 20mg  IV q6hours and remains net + with UOP.  Cardiology is now asked to consult regarding wall motion abnormalities on echo and CHF.  She denies any chest pain or pressure, SOB prior to this admission, PND, orthopnea, LE edema, dizziness, palpitations or syncope.    Past Medical History:  Diagnosis Date  . Abdominal pain, epigastric   . Acid reflux    Pt was born with acute acid  . Anxiety   . GERD (gastroesophageal reflux disease)   . Heart murmur   . Hiatal hernia   . Hypertension   . Hyponatremia 02/10/2020  . Schatzki's ring   . Ulcer    8 years ago    Past Surgical History:  Procedure Laterality Date  . ABDOMINAL HYSTERECTOMY     complete for fibroids  . KNEE ARTHROSCOPY     right knee  . shoulder repalcement     right  . TONSILLECTOMY AND ADENOIDECTOMY       Home Medications:  Prior to Admission medications   Medication Sig Start Date End Date Taking? Authorizing Provider  baclofen (LIORESAL) 10 MG tablet Take 10 mg by mouth daily as needed for muscle spasms.    Yes [provider]  bisoprolol (ZEBETA) 5 MG tablet Take 1 tablet (5 mg total) by mouth daily. 05/21/20 08/28/20 Yes Antonieta Pert, MD  traMADol (ULTRAM) 50 MG tablet Take 50 mg by mouth every 6 (six) hours as needed for moderate pain.  08/22/20  Yes [provider]  traZODone (DESYREL) 50 MG tablet Take 50 mg by mouth at bedtime as needed  for sleep. 08/22/20  Yes [provider]  amLODipine (NORVASC) 10 MG tablet Take 1 tablet (10 mg total) by mouth daily. 05/20/20 08/28/20  Antonieta Pert, MD    Inpatient Medications: Scheduled Meds: . arformoterol  15 mcg Nebulization BID  . budesonide (PULMICORT) nebulizer solution  0.25 mg Nebulization BID  . cephALEXin  500 mg Oral Q8H  . chlorhexidine  15 mL Mouth Rinse BID  . Chlorhexidine Gluconate Cloth  6 each Topical Daily  . enoxaparin (LOVENOX) injection  30 mg Subcutaneous Q24H  . famotidine  20 mg Oral Daily  .  feeding supplement (KATE FARMS STANDARD 1.4)  325 mL Oral BID BM  . furosemide  20 mg Intravenous Q6H  . gabapentin  100 mg Oral BID  . ipratropium-albuterol  3 mL Nebulization BID  . mouth rinse  15 mL Mouth Rinse q12n4p  . metoprolol tartrate  25 mg Oral BID  . nicotine  21 mg Transdermal Daily  . pantoprazole  40 mg Oral BID  . potassium chloride  20 mEq Oral Daily  . protein supplement  6 g Oral TID WC  . sodium chloride  3 mL Nebulization BID   Continuous Infusions:  PRN Meds: acetaminophen, albuterol, alum & mag hydroxide-simeth, docusate, metoprolol tartrate, morphine injection, polyethylene glycol, sodium chloride flush, traMADol  Allergies:    Allergies  Allergen Reactions  . Iodine Shortness Of Breath and Rash  . Thiazide-Type Diuretics Other (See Comments)    Severe hyponatremia July 2021  . Shellfish Allergy     Causes rash    Social History:   Social History   Socioeconomic History  . Marital status: Single    Spouse name: Not on file  . Number of children: Not on file  . Years of education: Not on file  . Highest education level: Not on file  Occupational History  . Not on file  Tobacco Use  . Smoking status: Current Every Day Smoker    Packs/day: 0.50    Types: Cigarettes  . Smokeless tobacco: Never Used  . Tobacco comment: almost a pack a day;   Vaping Use  . Vaping Use: Never used  Substance and Sexual Activity  . Alcohol use: No    Alcohol/week: 0.0 standard drinks  . Drug use: No  . Sexual activity: Not on file  Other Topics Concern  . Not on file  Social History Narrative   She does have BS degree from [employer]. 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 She is very  Much afraid of losing her job and is very concerned about this. She has supportive friends and ex- sponsors   Social Determinants of Health   Financial Resource Strain:   . Difficulty of Paying Living Expenses: Not on file  Food Insecurity:   .  Worried About Charity fundraiser in the Last Year: Not on file  . Ran Out of Food in the Last Year: Not on file  Transportation Needs:   . Lack of Transportation (Medical): Not on file  . Lack of Transportation (Non-Medical): Not on file  Physical Activity:   . Days of Exercise per Week: Not on file  . Minutes of Exercise per Session: Not on file  Stress:   . Feeling of Stress : Not on file  Social Connections:   . Frequency of Communication with Friends and Family: Not on file  . Frequency of Social Gatherings with Friends and Family: Not on file  .  Attends Religious Services: Not on file  . Active Member of Clubs or Organizations: Not on file  . Attends Archivist Meetings: Not on file  . Marital Status: Not on file  Intimate Partner Violence:   . Fear of Current or Ex-Partner: Not on file  . Emotionally Abused: Not on file  . Physically Abused: Not on file  . Sexually Abused: Not on file    Family History:    Family History  Problem Relation Age of Onset  . Prostate cancer Father   . Anxiety disorder Mother   . Colon cancer Maternal Grandmother      ROS:  Please see the history of present illness.   All other ROS reviewed and negative.     Physical Exam/Data:   Vitals:   09/10/20 2117 09/11/20 0500 09/11/20 0538 09/11/20 0809  BP: 100/73  121/71   Pulse: 90  85   Resp: 18  18   Temp: 97.9 F (36.6 C)  98.6 F (37 C)   TempSrc: Oral  Oral   SpO2: 100%  90% (!) 89%  Weight:  45.8 kg    Height:        Intake/Output Summary (Last 24 hours) at 09/11/2020 0846 Last data filed at 09/11/2020 0753 Gross per 24 hour  Intake --  Output 2900 ml  Net -2900 ml   Last 3 Weights 09/11/2020 09/10/2020 09/09/2020  Weight (lbs) 100 lb 15.5 oz 100 lb 8.5 oz 102 lb 11.8 oz  Weight (kg) 45.8 kg 45.6 kg 46.6 kg  Some encounter information is confidential and restricted. Go to Review Flowsheets activity to see all data.     Body mass index is 18.47 kg/m.   General:  Well nourished, well developed, in no acute distress HEENT: normal Lymph: no adenopathy Neck: no JVD Endocrine:  No thryomegaly Vascular: No carotid bruits; FA pulses 2+ bilaterally without bruits  Cardiac:  normal S1, S2; RRR; no murmur  Lungs:  clear to auscultation bilaterally, no wheezing, rhonchi or rales  Abd: soft, nontender, no hepatomegaly  Ext: trace edema Musculoskeletal:  No deformities, BUE and BLE strength normal and equal Skin: warm and dry  Neuro:  CNs 2-12 intact, no focal abnormalities noted Psych:  Normal affect   EKG:  The EKG was personally reviewed and demonstrates:  Sinus tachycardia and LAE Telemetry:  Telemetry was personally reviewed and demonstrates:  NSR  Relevant CV Studies: 2D echo 09/08/2020 IMPRESSIONS    1. Left ventricular ejection fraction, by estimation, is 55%. The left  ventricle has normal function. The left ventricle demonstrates regional  wall motion abnormalities with basal inferior and basal to mid  inferolateral akinesis. Left ventricular  diastolic parameters are consistent with Grade I diastolic dysfunction  (impaired relaxation).  2. Right ventricular systolic function is normal. The right ventricular  size is normal. Tricuspid regurgitation signal is inadequate for assessing  PA pressure.  3. Left atrial size was moderately dilated.  4. The mitral valve is normal in structure. Trivial mitral valve  regurgitation. No evidence of mitral stenosis. Moderate mitral annular  calcification.  5. The aortic valve was not well visualized. Aortic valve regurgitation  is not visualized. No aortic stenosis is present.  6. The inferior vena cava is normal in size with greater than 50%  respiratory variability, suggesting right atrial pressure of 3 mmHg.  7. Technically difficult study with poor acoustic windows.   Laboratory Data:  High Sensitivity Troponin:  No results for input(s): TROPONINIHS in the  last 720 hours.    Chemistry Recent Labs  Lab 09/09/20 1430 09/10/20 0800 09/11/20 0812  NA 131* 131* 130*  K 3.6 3.6 3.3*  CL 90* 86* 83*  CO2 34* 35* 35*  GLUCOSE 192* 102* 102*  BUN 28* 30* 22  CREATININE 0.61 0.68 0.68  CALCIUM 7.4* 7.6* 7.6*  GFRNONAA >60 >60 >60  ANIONGAP 7 10 12     Recent Labs  Lab 09/07/20 0230 09/08/20 0436 09/09/20 1430  PROT 4.9* 4.2* 4.5*  ALBUMIN 2.2* 1.9* 1.9*  AST 48* 37 37  ALT 48* 38 36  ALKPHOS 213* 155* 138*  BILITOT 0.3 0.4 0.4   Hematology Recent Labs  Lab 09/09/20 0843 09/10/20 0800 09/11/20 0812  WBC 21.5* 22.1* 15.7*  RBC 2.52* 3.07* 2.76*  HGB 7.9* 9.4* 8.3*  HCT 23.5* 27.8* 24.3*  MCV 93.3 90.6 88.0  MCH 31.3 30.6 30.1  MCHC 33.6 33.8 34.2  RDW 15.0 14.9 14.7  PLT 369 408* 486*   BNP Recent Labs  Lab 09/07/20 1701  BNP 3,015.3*    DDimer No results for input(s): DDIMER in the last 168 hours.   Radiology/Studies:  DG Chest 1 View  Result Date: 09/07/2020 CLINICAL DATA:  69 year old female status post thoracentesis. EXAM: CHEST  1 VIEW COMPARISON:  Chest radiograph dated 09/06/2020. FINDINGS: Small bilateral pleural effusions relatively similar to the prior radiograph. There are bibasilar atelectasis or infiltrate. No pneumothorax. Stable cardiomegaly. Left-sided PICC with tip at the cavoatrial junction. No acute osseous pathology. Right shoulder arthroplasty. IMPRESSION: Small bilateral pleural effusions with bibasilar atelectasis or infiltrate. No pneumothorax. Electronically Signed   By: Anner Crete M.D.   On: 09/07/2020 16:16   ECHOCARDIOGRAM COMPLETE  Result Date: 09/08/2020    ECHOCARDIOGRAM REPORT   Patient Name:   LASHAY OSBORNE [facility] Date of Exam: 09/08/2020 Medical Rec #:  [id number]        Height:       62.0 in Accession #:    [id number]       Weight:       106.3 lb Date of Birth:  02/08/1951        BSA:          1.461 m Patient Age:    23 years         BP:           115/69 mmHg Patient Gender: F                HR:            57 bpm. Exam Location:  Inpatient Procedure: 2D Echo Indications:    CHF 428  History:        Patient has no prior history of Echocardiogram examinations.                 Risk Factors:Current Smoker and Hypertension.  Sonographer:    Jannett Celestine RDCS (AE) Referring Phys: [id number] Kayleen Memos  Sonographer Comments: Suboptimal parasternal window. very limited mobility. unable to turn on left side. IMPRESSIONS  1. Left ventricular ejection fraction, by estimation, is 55%. The left ventricle has normal function. The left ventricle demonstrates regional wall motion abnormalities with basal inferior and basal to mid inferolateral akinesis. Left ventricular diastolic parameters are consistent with Grade I diastolic dysfunction (impaired relaxation).  2. Right ventricular systolic function is normal. The right ventricular size is normal. Tricuspid regurgitation signal is inadequate for assessing PA pressure.  3. Left atrial size was moderately dilated.  4.  The mitral valve is normal in structure. Trivial mitral valve regurgitation. No evidence of mitral stenosis. Moderate mitral annular calcification.  5. The aortic valve was not well visualized. Aortic valve regurgitation is not visualized. No aortic stenosis is present.  6. The inferior vena cava is normal in size with greater than 50% respiratory variability, suggesting right atrial pressure of 3 mmHg.  7. Technically difficult study with poor acoustic windows. FINDINGS  Left Ventricle: Left ventricular ejection fraction, by estimation, is 55%. The left ventricle has normal function. The left ventricle demonstrates regional wall motion abnormalities. The left ventricular internal cavity size was normal in size. There is  no left ventricular hypertrophy. Left ventricular diastolic parameters are consistent with Grade I diastolic dysfunction (impaired relaxation). Right Ventricle: The right ventricular size is normal. No increase in right ventricular wall  thickness. Right ventricular systolic function is normal. Tricuspid regurgitation signal is inadequate for assessing PA pressure. Left Atrium: Left atrial size was moderately dilated. Right Atrium: Right atrial size was normal in size. Pericardium: There is no evidence of pericardial effusion. Mitral Valve: The mitral valve is normal in structure. Moderate mitral annular calcification. Trivial mitral valve regurgitation. No evidence of mitral valve stenosis. Tricuspid Valve: The tricuspid valve is normal in structure. Tricuspid valve regurgitation is not demonstrated. Aortic Valve: The aortic valve was not well visualized. Aortic valve regurgitation is not visualized. No aortic stenosis is present. Pulmonic Valve: The pulmonic valve was not well visualized. Pulmonic valve regurgitation is not visualized. Aorta: The aortic root was not well visualized. Venous: The inferior vena cava is normal in size with greater than 50% respiratory variability, suggesting right atrial pressure of 3 mmHg. IAS/Shunts: No atrial level shunt detected by color flow Doppler.  LEFT VENTRICLE PLAX 2D LVOT diam:     1.70 cm  Diastology LV SV:         51       LV e' medial:    5.33 cm/s LV SV Index:   35       LV E/e' medial:  13.9 LVOT Area:     2.27 cm LV e' lateral:   5.77 cm/s                         LV E/e' lateral: 12.8  RIGHT VENTRICLE RV S prime:     16.10 cm/s TAPSE (M-mode): 1.7 cm LEFT ATRIUM             Index LA Vol (A2C):   50.0 ml 34.22 ml/m LA Vol (A4C):   82.9 ml 56.74 ml/m LA Biplane Vol: 63.8 ml 43.66 ml/m  AORTIC VALVE LVOT Vmax:   143.00 cm/s LVOT Vmean:  82.700 cm/s LVOT VTI:    0.226 m MITRAL VALVE MV Area (PHT): 3.68 cm    SHUNTS MV Decel Time: 206 msec    Systemic VTI:  0.23 m MV E velocity: 74.10 cm/s  Systemic Diam: 1.70 cm Loralie Champagne MD Electronically signed by Loralie Champagne MD Signature Date/Time: 09/08/2020/5:25:22 PM    Final    US THORACENTESIS ASP PLEURAL SPACE W/IMG GUIDE  Result Date:  09/07/2020 INDICATION: Patient recently admitted for acute respiratory failure with hypoxia due to suspected aspiration pneumonia versus community acquired pneumonia. New onset bilateral pleural effusions. Interventional radiology asked to perform a therapeutic and diagnostic thoracentesis. EXAM: ULTRASOUND GUIDED THORACENTESIS MEDICATIONS: 1% lidocaine 10 mL COMPLICATIONS: None immediate. PROCEDURE: An ultrasound guided thoracentesis was thoroughly discussed with the patient and questions answered.  The benefits, risks, alternatives and complications were also discussed. The patient understands and wishes to proceed with the procedure. Written consent was obtained. Ultrasound was performed to localize and mark an adequate pocket of fluid in the right chest. The area was then prepped and draped in the normal sterile fashion. 1% Lidocaine was used for local anesthesia. Under ultrasound guidance a 6 Fr Safe-T-Centesis catheter was introduced. Thoracentesis was performed. The catheter was removed and a dressing applied. FINDINGS: A total of approximately 550 mL of clear yellow fluid was removed. Samples were sent to the laboratory as requested by the clinical team. IMPRESSION: Successful ultrasound guided right thoracentesis yielding 550 mL of pleural fluid. Read by: Soyla Dryer, NP Electronically Signed   By: Jacqulynn Cadet M.D.   On: 09/07/2020 16:42     Assessment and Plan:   1. Abnormal echo -2D echo showed overall normal LVF with focal wall motion abnormalities in the basal inferior and basal to mid inferolateral walls but images were of very poor quality -she has no fm hx of CAD but was a smoker -prior to admission she denied any hx of Chest pain or pressure and was quite active -will repeat limited echo with definity to make sure wall motion abnormalities are real before proceeding with any further cardiac workup as she has never had any anginal sx and EKG is not c/w infarct  2.  Acute  diastolic CHF -likely exacerbated by recent severe PNA with hypoxic respiratory failure -initial chest CT with no edema -s/p thoracentesis of 550cc clear fluid -BNP on 10/27 300 -Cxray on 10/27 showed small b/l pleural effusions -she is diuresing well on current lasix regimen and put out 2.4L yesterday and is still net+ 6.9L -Na low at 130 but has a hx of SIADH -SCr normal at 0.68 -I am not convinced she is that volume overloaded on exam -continue Lasix and change to 40mg  IV BID -continue to replete K+ to keep>4 and Mag>2 -repeat Cxray and BNP to help guide therapy  3.  Acute respiratory failure with hypoxia -due to aspiration PNA vs. CAP -initially required intubation but now extubated on RA -s/p thoracentesis   4.  HTN -BP controlled -continue Lopressor 25mg  BID        New York Heart Association (NYHA) Functional Class NYHA Class II        For questions or updates, please contact [facility] Please consult [email or web address] for contact info under    Signed, Fransico Him, MD  09/11/2020 8:46 AM

## 2020-09-11 NOTE — Progress Notes (Signed)
  Echocardiogram 2D Echocardiogram has been performed.  Jennette Dubin 09/11/2020, 1:59 PM

## 2020-09-11 NOTE — Progress Notes (Signed)
PROGRESS NOTE  Jake Goodson Ola GDJ:[id number] DOB: 1950/12/23 DOA: 08/28/2020 PCP: Nicholes Rough, PA-C  HPI/Recap of past 24 hours: PCCM to [facility] transfer 09/06/2020: 69 year old female with history of hiatal hernia, Schatzki's ring, hyponatremia, tobacco abuse, suspected SIADH, who was found down at home by her brother after she was last known normal the day PTA.  When EMS arrived, she was hypoxic in the sixties and had difficulty breathing.  She was placed on NRB, and when assessed in the ED, decision was made to intubate.  She was admitted to ICU for acute respiratory failure with hypoxia due to suspected aspiration pneumonia versus CAP, acute metabolic encephalopathy and acute kidney injury.  Extubated 10/23 and transferred to telemetry 09/06/20.  Due to persistent tachypnea, PCCM reevaluated on 09/06/20. Patient lives alone, manages her own meds. She has had substantial weight loss over the last 1 year.  Elevate BNP>3000 with bilateral pleural effusions R>L.  Post right thoracentesis by IR with greater than 500 cc removed.  Started on IV Lasix 20 mg every 6 hours on 09/07/2020.  2D echo ordered and pending.  09/11/20: Left foot cellulitis improved.  States she feels anxious and requests Ativan.  Seen by cardiology.  Appreciate recommendations.  Assessment/Plan: Active Problems:   Hyponatremia   Acute and chr resp failure, unsp w hypoxia or hypercapnia (HCC)   Malnutrition of moderate degree   Hiatal hernia   Acute respiratory failure (HCC)   Palliative care by specialist   Goals of care, counseling/discussion   Advanced care planning/counseling discussion  Resolved acute hypoxic hypercarbic respiratory failure likely multifactorial secondary to bilateral pleural effusion, mild pulmonary edema, suspected aspiration versus treated community-acquired pneumonia She presented from home severely hypoxic, was intubated on presentation 08/28/2020 and extubated on 09/03/2020. Not on oxygen  supplementation at baseline  Her hypoxemia has resolved, O2 saturation 94% on room air. Latest ABG with FiO2 44, 7.463/48.1/131 Home O2 evaluation prior to DC  Acute diastolic CHF Bilateral pleural effusion, pulmonary edema, BNP greater than 3000 Abnormal 2D echo done on 09/08/2020 showed regional wall motion abnormalities with basal inferior and basal to mid inferior lateral akinesis.  Left ventricular diastolic parameters are consistent with grade 1 diastolic dysfunction.  Left atrial size was moderately dilated.  Moderate mitral annular calcification. Ongoing diuresing, 20 mg IV Lasix every 6 hours Net I&O +9.4L unclear if accurate C/w strict I's and O's and daily weight Cardiology consult  Improving left foot cellulitis WBC 21.5>> 15.7. C/w Keflex 500 mg 3 times daily day 2/10 days Follow blood cultures x2 negative to date. MRSA negative Erythema, tenderness and warmth resolving  Anxiety, situational Requested Ativan as needed We will switch to hydroxyzine as needed  Hypokalemia Serum potassium 3.3 Repleted orally  GERD PPI BID Last EGD 03/22/2015 w rec to take daily PPI C/w close monitoring  Resolving Bilateral pleural effusions right greater than left, mild pulmonary edema, likely cardiogenic, post right thoracentesis R thoracentesis by IR with greater than 500 cc removed BNP greater than 3000 on 09/07/2020 Continue IV Lasix 20 mg every 6 hours. Continue to monitor volume status and urine output.  Resolving Elevated liver chemistries, improving Trend alkaline phosphatase, AST and ALT LFTs are downtrending Continue to avoid hepatotoxic agents  Essential hypertension BP is at goal  Severe protein calorie malnutrition BMI 20 Albumin 1.9 Severe muscle mass loss Encourage increase in oral protein calorie intake Oral supplement Off TPN since 09/10/20  Dysphagia, improved Seen by speech therapist with recommendation for regular consistency diet and  thin  liquid Continue aspiration precaution  Physical debility PT assessment recommended SNF Continue PT OT with assistance of all the questions TOC assisting with SNF placement.  Goals of care Palliative care team following and assisting with establishing goals of care Currently she is DNR PMT:DNR with the caveat that patient is agreeable to ventilator if needed if her problem is respiratory only. -Continue current scope of care -No feeding tube -Raniyah is agreeable to SNF placement rehab short-term but but would not want long-term.     Code Status: DNR  Family Communication: None at bedside  Disposition Plan: SNF   Consultants:  PCCM  Palliative care medicine  Procedures: Intubation Extubation  Antimicrobials:  Keflex 09/09/20>>>  DVT prophylaxis: Subcu Lovenox daily  Status is: Inpatient    Dispo: The patient is from: Home.              Anticipated d/c is to: SNF.               Anticipated d/c date is: 09/11/2020.              Patient currently not stable for discharge due to ongoing evaluation and management of left foot cellulitis and abnormal 2D echo.        Objective: Vitals:   09/11/20 0500 09/11/20 0538 09/11/20 0809 09/11/20 1332  BP:  121/71  (!) 145/84  Pulse:  85  89  Resp:  18  15  Temp:  98.6 F (37 C)  99.4 F (37.4 C)  TempSrc:  Oral  Oral  SpO2:  90% (!) 89% 98%  Weight: 45.8 kg     Height:        Intake/Output Summary (Last 24 hours) at 09/11/2020 1758 Last data filed at 09/11/2020 1300 Gross per 24 hour  Intake 360 ml  Output 2275 ml  Net -1915 ml   Filed Weights   09/09/20 0358 09/10/20 0427 09/11/20 0500  Weight: 46.6 kg 45.6 kg 45.8 kg    Exam:  . General: 69 y.o. year-old female chronically ill-appearing no acute distress.  Alert and oriented x3.   . Cardiovascular: Regular rate and rhythm no rubs or gallops.  Marland Kitchen Respiratory: Clear to auscultation no wheeze no rales.  . Abdomen: Soft nontender normal bowel  sounds present. . Musculoskeletal: No lower extremity edema bilaterally.  Left foot cellulitis less erythematous, minimal warmth or tenderness.  Marland Kitchen Psychiatry: Mood is anxious.  Data Reviewed: CBC: Recent Labs  Lab 09/05/20 0430 09/05/20 0547 09/07/20 1701 09/08/20 0758 09/09/20 0843 09/10/20 0800 09/11/20 0812  WBC 13.8*   < > 22.6* 19.6* 21.5* 22.1* 15.7*  NEUTROABS 11.2*  --  19.5*  --  19.0* 18.5* 12.2*  HGB 9.2*   < > 8.9* 8.7* 7.9* 9.4* 8.3*  HCT 29.1*   < > 27.7* 26.0* 23.5* 27.8* 24.3*  MCV 97.7   < > 94.5 92.9 93.3 90.6 88.0  PLT 255   < > 359 358 369 408* 486*   < > = values in this interval not displayed.   Basic Metabolic Panel: Recent Labs  Lab 09/05/20 0430 09/05/20 0430 09/05/20 0547 09/05/20 0547 09/06/20 0500 09/06/20 0500 09/07/20 0230 09/07/20 0230 09/08/20 0436 09/08/20 1902 09/09/20 1430 09/10/20 0800 09/11/20 0812  NA 135   < > 142   < > 141   < > 140   < > 136 134* 131* 131* 130*  K 6.8*   < > 4.0   < > 4.3   < >  4.5   < > 4.1 4.0 3.6 3.6 3.3*  CL 104   < > 107   < > 106   < > 104   < > 100 94* 90* 86* 83*  CO2 27   < > 29   < > 30   < > 31   < > 31 32 34* 35* 35*  GLUCOSE 797*   < > 137*   < > 129*   < > 127*   < > 114* 102* 192* 102* 102*  BUN 21   < > 23   < > 24*   < > 28*   < > 28* 27* 28* 30* 22  CREATININE 0.61   < > 0.51   < > 0.46   < > 0.52   < > 0.45 0.52 0.61 0.68 0.68  CALCIUM 7.9*   < > 8.1*   < > 8.1*   < > 8.2*   < > 7.7* 7.5* 7.4* 7.6* 7.6*  MG 2.3  --  1.9  --  2.0  --  2.1  --  2.0  --   --   --   --   PHOS 5.0*  --  2.3*  --  3.2  --  3.3  --  3.4  --   --   --   --    < > = values in this interval not displayed.   GFR: Estimated Creatinine Clearance: 49.3 mL/min (by C-G formula based on SCr of 0.68 mg/dL). Liver Function Tests: Recent Labs  Lab 09/05/20 0430 09/05/20 0547 09/07/20 0230 09/08/20 0436 09/09/20 1430  AST 39 45* 48* 37 37  ALT 32 40 48* 38 36  ALKPHOS 140* 156* 213* 155* 138*  BILITOT <0.1* 0.3 0.3  0.4 0.4  PROT 4.2* 4.8* 4.9* 4.2* 4.5*  ALBUMIN 2.0* 2.2* 2.2* 1.9* 1.9*   No results for input(s): LIPASE, AMYLASE in the last 168 hours. No results for input(s): AMMONIA in the last 168 hours. Coagulation Profile: No results for input(s): INR, PROTIME in the last 168 hours. Cardiac Enzymes: No results for input(s): CKTOTAL, CKMB, CKMBINDEX, TROPONINI in the last 168 hours. BNP (last 3 results) No results for input(s): PROBNP in the last 8760 hours. HbA1C: No results for input(s): HGBA1C in the last 72 hours. CBG: Recent Labs  Lab 09/08/20 1647 09/08/20 1718 09/08/20 1827 09/08/20 1832 09/08/20 2009  GLUCAP 43* 68* 32* 41* 40*   Lipid Profile: No results for input(s): CHOL, HDL, LDLCALC, TRIG, CHOLHDL, LDLDIRECT in the last 72 hours. Thyroid Function Tests: No results for input(s): TSH, T4TOTAL, FREET4, T3FREE, THYROIDAB in the last 72 hours. Anemia Panel: No results for input(s): VITAMINB12, FOLATE, FERRITIN, TIBC, IRON, RETICCTPCT in the last 72 hours. Urine analysis:    Component Value Date/Time   COLORURINE YELLOW 08/28/2020 2022   APPEARANCEUR HAZY (A) 08/28/2020 2022   LABSPEC 1.012 08/28/2020 2022   PHURINE 5.0 08/28/2020 2022   GLUCOSEU 50 (A) 08/28/2020 2022   HGBUR MODERATE (A) 08/28/2020 2022   BILIRUBINUR NEGATIVE 08/28/2020 2022   KETONESUR 5 (A) 08/28/2020 2022   PROTEINUR 30 (A) 08/28/2020 2022   UROBILINOGEN 0.2 05/25/2011 1337   NITRITE NEGATIVE 08/28/2020 2022   LEUKOCYTESUR TRACE (A) 08/28/2020 2022   Sepsis Labs: @LABRCNTIP (procalcitonin:4,lacticidven:4)  ) Recent Results (from the past 240 hour(s))  Body fluid culture     Status: None   Collection Time: 09/07/20  3:57 PM   Specimen: PATH Cytology Pleural fluid  Result  Value Ref Range Status   Specimen Description   Final    PLEURAL Performed at [facility] [address] [place], [place] [address]    Special Requests   Final    NONE Performed at [facility], [place] [address] [place], [place] [address]    Gram Stain   Final    WBC PRESENT,BOTH PMN AND MONONUCLEAR NO ORGANISMS SEEN CYTOSPIN SMEAR    Culture   Final    NO GROWTH Performed at [facility], [place] [address], [place], [place] [address]    Report Status 09/10/2020 FINAL  Final  Culture, blood (routine x 2)     Status: None (Preliminary result)   Collection Time: 09/10/20  7:58 AM   Specimen: BLOOD  Result Value Ref Range Status   Specimen Description   Final    BLOOD RIGHT ARM Performed at [facility] [address], [place], [place] [address]    Special Requests   Final    BOTTLES DRAWN AEROBIC ONLY Blood Culture adequate volume Performed at [facility] [address] [place], [place] [address]    Culture   Final    NO GROWTH < 24 HOURS Performed at [facility] [address] [place], [place] [address]    Report Status PENDING  Incomplete  Culture, blood (routine x 2)     Status: None (Preliminary result)   Collection Time: 09/10/20  7:59 AM   Specimen: BLOOD  Result Value Ref Range Status   Specimen Description   Final    BLOOD RIGHT ARM Performed at [place] [facility] [address], [place], [place] [address]    Special Requests   Final    BOTTLES DRAWN AEROBIC ONLY Blood Culture adequate volume Performed at [facility] [address] [place], [place] [address]    Culture   Final    NO GROWTH < 24 HOURS Performed at [facility] [address] [place], [place] [address]    Report Status PENDING  Incomplete  MRSA PCR Screening     Status: None   Collection Time: 09/10/20 11:46 AM   Specimen: Nasopharyngeal Wash  Result Value Ref Range Status   MRSA by PCR NEGATIVE NEGATIVE Final    Comment:        The GeneXpert MRSA Assay (FDA approved for NASAL specimens only), is one component of a comprehensive MRSA colonization surveillance  program. It is not intended to diagnose MRSA infection nor to guide or monitor treatment for MRSA infections. Performed at [facility], [place] [address], [place],  [address]       Studies: DG Chest 2 View  Result Date: 09/11/2020 CLINICAL DATA:  Weakness.  CHF. EXAM: CHEST - 2 VIEW COMPARISON:  09/07/2020 FINDINGS: Moderate bilateral pleural effusions obscure the hemidiaphragms and portions of the heart border, unchanged from the prior study. There is additional AC opacity at the lung bases that is likely atelectasis. No evidence of pulmonary edema. No pneumothorax. Cardiac silhouette is partly obscured, grossly normal in size. No mediastinal or hilar masses. Left sided PICC is stable, tip in the lower superior vena cava. IMPRESSION: 1. Findings similar to the prior exam. Moderate bilateral pleural effusions with basilar atelectasis. No convincing pneumonia and no pulmonary edema. Electronically Signed   By: Lajean Manes M.D.   On: 09/11/2020 11:51   ECHOCARDIOGRAM LIMITED  Result Date: 09/11/2020  ECHOCARDIOGRAM LIMITED REPORT   Patient Name:   ROSALIND GUIDO [facility] Date of Exam: 09/11/2020 Medical Rec #:  [id number]        Height:       62.0 in Accession #:    [id number]       Weight:       101.0 lb Date of Birth:  [birth year]        BSA:          1.430 m Patient Age:    49 years         BP:           121/71 mmHg Patient Gender: F                HR:           85 bpm. Exam Location:  Inpatient Procedure: Limited Echo and Intracardiac Opacification Agent Indications:    CHF- Acute Diastolic X79.39; Needs Definity to rule out focal                 wall motion abnormalities.  History:        Patient has prior history of Echocardiogram examinations, most                 recent 09/08/2020. Signs/Symptoms:Murmur; Risk                 Factors:Hypertension.  Sonographer:    Mikki Santee RDCS (AE) Referring Phys: [id number] TRACI R TURNER IMPRESSIONS  1. Left ventricular ejection  fraction, by estimation, is 60 to 65%. The left ventricle has normal function. There is akinesis of the left ventricular, basal inferior wall. The inferolateral wall appears normal. FINDINGS  Left Ventricle: Left ventricular ejection fraction, by estimation, is 60 to 65%. The left ventricle has normal function. Definity contrast agent was given IV to delineate the left ventricular endocardial borders. Fransico Him MD Electronically signed by Fransico Him MD Signature Date/Time: 09/11/2020/2:23:01 PM    Final     Scheduled Meds: . arformoterol  15 mcg Nebulization BID  . budesonide (PULMICORT) nebulizer solution  0.25 mg Nebulization BID  . cephALEXin  500 mg Oral Q8H  . chlorhexidine  15 mL Mouth Rinse BID  . Chlorhexidine Gluconate Cloth  6 each Topical Daily  . enoxaparin (LOVENOX) injection  30 mg Subcutaneous Q24H  . famotidine  20 mg Oral Daily  . feeding supplement (KATE FARMS STANDARD 1.4)  325 mL Oral BID BM  . furosemide  40 mg Intravenous BID  . gabapentin  100 mg Oral BID  . ipratropium-albuterol  3 mL Nebulization BID  . mouth rinse  15 mL Mouth Rinse q12n4p  . metoprolol tartrate  25 mg Oral BID  . nicotine  21 mg Transdermal Daily  . pantoprazole  40 mg Oral BID  . potassium chloride  40 mEq Oral BID  . protein supplement  6 g Oral TID WC  . sodium chloride  3 mL Nebulization BID    Continuous Infusions:    LOS: 14 days     Kayleen Memos, MD [facility] Pager [phone]  If 7PM-7AM, please contact night-coverage [email or web address] Password [facility] 09/11/2020, 5:58 PM

## 2020-09-12 DIAGNOSIS — I5031 Acute diastolic (congestive) heart failure: Secondary | ICD-10-CM | POA: Diagnosis not present

## 2020-09-12 DIAGNOSIS — I5030 Unspecified diastolic (congestive) heart failure: Secondary | ICD-10-CM

## 2020-09-12 DIAGNOSIS — I509 Heart failure, unspecified: Secondary | ICD-10-CM

## 2020-09-12 LAB — CBC WITH DIFFERENTIAL/PLATELET
Abs Immature Granulocytes: 0.07 10*3/uL (ref 0.00–0.07)
Basophils Absolute: 0.1 10*3/uL (ref 0.0–0.1)
Basophils Relative: 0 %
Eosinophils Absolute: 0 10*3/uL (ref 0.0–0.5)
Eosinophils Relative: 0 %
HCT: 25.8 % — ABNORMAL LOW (ref 36.0–46.0)
Hemoglobin: 8.7 g/dL — ABNORMAL LOW (ref 12.0–15.0)
Immature Granulocytes: 1 %
Lymphocytes Relative: 14 %
Lymphs Abs: 2.1 10*3/uL (ref 0.7–4.0)
MCH: 29.9 pg (ref 26.0–34.0)
MCHC: 33.7 g/dL (ref 30.0–36.0)
MCV: 88.7 fL (ref 80.0–100.0)
Monocytes Absolute: 1.4 10*3/uL — ABNORMAL HIGH (ref 0.1–1.0)
Monocytes Relative: 10 %
Neutro Abs: 10.8 10*3/uL — ABNORMAL HIGH (ref 1.7–7.7)
Neutrophils Relative %: 75 %
Platelets: 565 10*3/uL — ABNORMAL HIGH (ref 150–400)
RBC: 2.91 MIL/uL — ABNORMAL LOW (ref 3.87–5.11)
RDW: 14.6 % (ref 11.5–15.5)
WBC: 14.5 10*3/uL — ABNORMAL HIGH (ref 4.0–10.5)
nRBC: 0 % (ref 0.0–0.2)

## 2020-09-12 LAB — BASIC METABOLIC PANEL
Anion gap: 10 (ref 5–15)
BUN: 17 mg/dL (ref 8–23)
CO2: 34 mmol/L — ABNORMAL HIGH (ref 22–32)
Calcium: 7.8 mg/dL — ABNORMAL LOW (ref 8.9–10.3)
Chloride: 83 mmol/L — ABNORMAL LOW (ref 98–111)
Creatinine, Ser: 0.64 mg/dL (ref 0.44–1.00)
GFR, Estimated: >60 mL/min (ref >60)
Glucose, Bld: 103 mg/dL — ABNORMAL HIGH (ref 70–99)
Potassium: 3.5 mmol/L (ref 3.5–5.1)
Sodium: 127 mmol/L — ABNORMAL LOW (ref 135–145)

## 2020-09-12 LAB — GLUCOSE, CAPILLARY
Glucose-Capillary: 32 mg/dL — CL (ref 70–99)
Glucose-Capillary: 95 mg/dL (ref 70–99)

## 2020-09-12 LAB — MAGNESIUM: Magnesium: 2 mg/dL (ref 1.7–2.4)

## 2020-09-12 MED ORDER — POTASSIUM CHLORIDE 20 MEQ/15ML (10%) PO SOLN
40.0000 meq | Freq: Two times a day (BID) | ORAL | Status: DC
  Administered 2020-09-12 – 2020-09-14 (×5): 40 meq via ORAL
  Filled 2020-09-12 (×5): qty 30

## 2020-09-12 NOTE — Progress Notes (Signed)
PROGRESS NOTE  [name] XBM:[id number] DOB: 15-Mar-1951 DOA: 08/28/2020 PCP: Nicholes Rough, PA-C  HPI/Recap of past 24 hours: PCCM to [facility] transfer 09/06/2020: 69 year old female with history of hiatal hernia, Schatzki's ring, hyponatremia, tobacco abuse, suspected SIADH, who was found down at home by her brother after she was last known normal the day PTA.  When EMS arrived, she was hypoxic in the sixties and had difficulty breathing.  She was placed on NRB, and when assessed in the ED, decision was made to intubate.  She was admitted to ICU for acute respiratory failure with hypoxia due to suspected aspiration pneumonia versus CAP, acute metabolic encephalopathy and acute kidney injury.  Extubated 10/23 and transferred to telemetry 09/06/20.  Due to persistent tachypnea, PCCM reevaluated on 09/06/20. Patient lives alone, manages her own meds. She has had substantial weight loss over the last 1 year.  Elevate BNP>3000 with bilateral pleural effusions R>L.  Post right thoracentesis by IR with greater than 500 cc removed.  Started on IV Lasix 20 mg every 6 hours on 09/07/2020.  2D echo ordered and pending.  09/12/20:  Left foot cellulitis improving.  Denies chest pain.  Reports acid reflux prior to taking her scheduled PPI.  Denies dyspnea while at rest.  Reviewed CXR done on 09/11/20 showing bilaterally pleural effusions.  Advised to use her incentive spirometer and flutter valve.   Assessment/Plan: Active Problems:   Hyponatremia   Acute and chr resp failure, unsp w hypoxia or hypercapnia (HCC)   Malnutrition of moderate degree   Hiatal hernia   Acute respiratory failure (HCC)   Palliative care by specialist   Goals of care, counseling/discussion   Advanced care planning/counseling discussion   CHF (congestive heart failure) ([place])  Resolved acute hypoxic hypercarbic respiratory failure likely multifactorial secondary to bilateral pleural effusions, mild pulmonary edema, suspected  aspiration, treated community-acquired pneumonia She presented from home severely hypoxic, was intubated on presentation 08/28/2020 and extubated on 09/03/2020. Not on oxygen supplementation at baseline  Her hypoxemia has resolved, O2 saturation 94% on room air. Latest ABG with FiO2 44, 7.463/48.1/131 Home O2 evaluation prior to DC Use incentive spirometer q1H when awake and flutter valve q4H when awake Continue IV lasix as rec by cardiology  Bilateral pleural effusions likely from 3rd spacing in the setting of hypoalbuminemia Personally reviewed last CXR 09/11/20, showing B/L pleural effusions C/w IV diuretic + net I&O but improved IS and flutter valve as stated above  Acute diastolic CHF Bilateral pleural effusion, pulmonary edema, BNP greater than 3000 Abnormal 2D echo done on 09/08/2020 showed regional wall motion abnormalities with basal inferior and basal to mid inferior lateral akinesis.  Left ventricular diastolic parameters are consistent with grade 1 diastolic dysfunction.  Left atrial size was moderately dilated.  Moderate mitral annular calcification. Ongoing diuresing IV Lasix 40 mg BID- Net I&O +9.4L>> +2.5L on 09/12/20 C/w strict I's and O's and daily weight Cardiology following  Electrolytes derangement 2/2 diuretic Hypokalemia  Replace K+ while on Lasix Follow K+ and mg2+ levels  Hx of SIADH Acute drop in Na+ level 127 from 130 If worsens with symptoms can treat with salt tablets if ok with cardiology Continue to monitor serum Na+ while on diuretic  Improving left foot cellulitis WBC 21.5>> 15.7>> 14.5K. C/w Keflex 500 mg 3 times daily day 3/10 days Follow blood cultures x2 negative to date. MRSA negative Erythema, tenderness and warmth resolving  GERD Continue PPI Last EGD 03/22/2015 w rec to take daily PPI C/w close  monitoring  Anxiety, situational Requested Ativan as needed We will switch to hydroxyzine as needed  Resolved Hypokalemia Serum  potassium 3.3>3.5 Goal K+ 4.0 Continue to Replete orally  Post right thoracentesis R thoracentesis by IR with greater than 500 cc removed BNP greater than 3000 on 09/07/2020 Continue IV Lasix 40 mg BID  Resolving Elevated liver chemistries, improving Trend alkaline phosphatase, AST and ALT LFTs are downtrending Continue to avoid hepatotoxic agents  Essential hypertension BP is at goal  Severe protein calorie malnutrition BMI 20 Albumin 1.9 Severe muscle mass loss Encourage increase in oral protein calorie intake Oral supplement Off TPN since 09/10/20  Dysphagia, improved Seen by speech therapist with recommendation for regular consistency diet and thin liquid Continue aspiration precaution  Physical debility PT assessment recommended SNF Continue PT OT with assistance of all the questions TOC assisting with SNF placement.  Goals of care Palliative care team following and assisting with establishing goals of care Currently she is DNR PMT:DNR with the caveat that patient is agreeable to ventilator if needed if her problem is respiratory only. -Continue current scope of care -No feeding tube -Labrina is agreeable to SNF placement rehab short-term but but would not want long-term.     Code Status: DNR  Family Communication: None at bedside  Disposition Plan: SNF   Consultants:  PCCM  Palliative care medicine  Procedures: Intubation Extubation  Antimicrobials:  Keflex 09/09/20>>>  DVT prophylaxis: Subcu Lovenox daily  Status is: Inpatient    Dispo: The patient is from: Home.              Anticipated d/c is to: SNF.               Anticipated d/c date is: 09/13/2020.              Patient currently not stable for discharge due to ongoing evaluation and management of acute diastolic CHF.       Objective: Vitals:   09/12/20 0454 09/12/20 0500 09/12/20 0758 09/12/20 1427  BP: 119/69   136/74  Pulse: 88   (!) 49  Resp: (!) 22   20  Temp: 98.4  F (36.9 C)   (!) 97.4 F (36.3 C)  TempSrc:    Oral  SpO2: 92%  93% (!) 75%  Weight:  42.8 kg    Height:        Intake/Output Summary (Last 24 hours) at 09/12/2020 1533 Last data filed at 09/12/2020 0902 Gross per 24 hour  Intake 120 ml  Output 1400 ml  Net -1280 ml   Filed Weights   09/10/20 0427 09/11/20 0500 09/12/20 0500  Weight: 45.6 kg 45.8 kg 42.8 kg    Exam:  . General: 69 y.o. year-old female Pleasant chronically ill appearing in NAD. A&O x3.  . Cardiovascular: RRR no rubs or gallops . Respiratory: Mild rales at bases.  Poor inspiratory efforts  . Abdomen: Soft NT NBS x4 . Musculoskeletal: No LE edema B/L. Left foot cellulitis improving, less erythema, tenderness and warmth.  . Psychiatry:Mood is appropriate.  Data Reviewed: CBC: Recent Labs  Lab 09/07/20 1701 09/07/20 1701 09/08/20 0758 09/09/20 0843 09/10/20 0800 09/11/20 0812 09/12/20 0813  WBC 22.6*   < > 19.6* 21.5* 22.1* 15.7* 14.5*  NEUTROABS 19.5*  --   --  19.0* 18.5* 12.2* 10.8*  HGB 8.9*   < > 8.7* 7.9* 9.4* 8.3* 8.7*  HCT 27.7*   < > 26.0* 23.5* 27.8* 24.3* 25.8*  MCV 94.5   < >  92.9 93.3 90.6 88.0 88.7  PLT 359   < > 358 369 408* 486* 565*   < > = values in this interval not displayed.   Basic Metabolic Panel: Recent Labs  Lab 09/06/20 0500 09/06/20 0500 09/07/20 0230 09/07/20 0230 09/08/20 0436 09/08/20 0436 09/08/20 1902 09/09/20 1430 09/10/20 0800 09/11/20 0812 09/12/20 0813  NA 141   < > 140   < > 136   < > 134* 131* 131* 130* 127*  K 4.3   < > 4.5   < > 4.1   < > 4.0 3.6 3.6 3.3* 3.5  CL 106   < > 104   < > 100   < > 94* 90* 86* 83* 83*  CO2 30   < > 31   < > 31   < > 32 34* 35* 35* 34*  GLUCOSE 129*   < > 127*   < > 114*   < > 102* 192* 102* 102* 103*  BUN 24*   < > 28*   < > 28*   < > 27* 28* 30* 22 17  CREATININE 0.46   < > 0.52   < > 0.45   < > 0.52 0.61 0.68 0.68 0.64  CALCIUM 8.1*   < > 8.2*   < > 7.7*   < > 7.5* 7.4* 7.6* 7.6* 7.8*  MG 2.0  --  2.1  --  2.0  --    --   --   --   --  2.0  PHOS 3.2  --  3.3  --  3.4  --   --   --   --   --   --    < > = values in this interval not displayed.   GFR: Estimated Creatinine Clearance: 46.1 mL/min (by C-G formula based on SCr of 0.64 mg/dL). Liver Function Tests: Recent Labs  Lab 09/07/20 0230 09/08/20 0436 09/09/20 1430  AST 48* 37 37  ALT 48* 38 36  ALKPHOS 213* 155* 138*  BILITOT 0.3 0.4 0.4  PROT 4.9* 4.2* 4.5*  ALBUMIN 2.2* 1.9* 1.9*   No results for input(s): LIPASE, AMYLASE in the last 168 hours. No results for input(s): AMMONIA in the last 168 hours. Coagulation Profile: No results for input(s): INR, PROTIME in the last 168 hours. Cardiac Enzymes: No results for input(s): CKTOTAL, CKMB, CKMBINDEX, TROPONINI in the last 168 hours. BNP (last 3 results) No results for input(s): PROBNP in the last 8760 hours. HbA1C: No results for input(s): HGBA1C in the last 72 hours. CBG: Recent Labs  Lab 09/08/20 1647 09/08/20 1718 09/08/20 1827 09/08/20 1832 09/08/20 2009  GLUCAP 43* 68* 32* 41* 40*   Lipid Profile: No results for input(s): CHOL, HDL, LDLCALC, TRIG, CHOLHDL, LDLDIRECT in the last 72 hours. Thyroid Function Tests: No results for input(s): TSH, T4TOTAL, FREET4, T3FREE, THYROIDAB in the last 72 hours. Anemia Panel: No results for input(s): VITAMINB12, FOLATE, FERRITIN, TIBC, IRON, RETICCTPCT in the last 72 hours. Urine analysis:    Component Value Date/Time   COLORURINE YELLOW 08/28/2020 2022   APPEARANCEUR HAZY (A) 08/28/2020 2022   LABSPEC 1.012 08/28/2020 2022   PHURINE 5.0 08/28/2020 2022   GLUCOSEU 50 (A) 08/28/2020 2022   HGBUR MODERATE (A) 08/28/2020 2022   BILIRUBINUR NEGATIVE 08/28/2020 2022   KETONESUR 5 (A) 08/28/2020 2022   PROTEINUR 30 (A) 08/28/2020 2022   UROBILINOGEN 0.2 05/25/2011 1337   NITRITE NEGATIVE 08/28/2020 2022   LEUKOCYTESUR TRACE (A) 08/28/2020 2022  Sepsis Labs: @LABRCNTIP (procalcitonin:4,lacticidven:4)  ) Recent Results (from the past  240 hour(s))  Body fluid culture     Status: None   Collection Time: 09/07/20  3:57 PM   Specimen: PATH Cytology Pleural fluid  Result Value Ref Range Status   Specimen Description   Final    PLEURAL Performed at [facility] [address] [place], [place] [address]    Special Requests   Final    NONE Performed at [facility], [place] [address], [place], [place] [address]    Gram Stain   Final    WBC PRESENT,BOTH PMN AND MONONUCLEAR NO ORGANISMS SEEN CYTOSPIN SMEAR    Culture   Final    NO GROWTH Performed at [facility], [place] [address], [place], [place] [address]    Report Status 09/10/2020 FINAL  Final  Culture, blood (routine x 2)     Status: None (Preliminary result)   Collection Time: 09/10/20  7:58 AM   Specimen: BLOOD  Result Value Ref Range Status   Specimen Description   Final    BLOOD RIGHT ARM Performed at [facility] [address], [place], [place] [address]    Special Requests   Final    BOTTLES DRAWN AEROBIC ONLY Blood Culture adequate volume Performed at [facility] [address] [place], [place] [address]    Culture   Final    NO GROWTH 2 DAYS Performed at [facility] [address], [place], [place] [address]    Report Status PENDING  Incomplete  Culture, blood (routine x 2)     Status: None (Preliminary result)   Collection Time: 09/10/20  7:59 AM   Specimen: BLOOD  Result Value Ref Range Status   Specimen Description   Final    BLOOD RIGHT ARM Performed at [facility] [address], [place], [place] [address]    Special Requests   Final    BOTTLES DRAWN AEROBIC ONLY Blood Culture adequate volume Performed at [facility] [address], [place], [place] [address]    Culture   Final    NO GROWTH 2 DAYS Performed at [facility] [address] [place], [place] [address]    Report Status PENDING   Incomplete  MRSA PCR Screening     Status: None   Collection Time: 09/10/20 11:46 AM   Specimen: Nasopharyngeal Wash  Result Value Ref Range Status   MRSA by PCR NEGATIVE NEGATIVE Final    Comment:        The GeneXpert MRSA Assay (FDA approved for NASAL specimens only), is one component of a comprehensive MRSA colonization surveillance program. It is not intended to diagnose MRSA infection nor to guide or monitor treatment for MRSA infections. Performed at [facility], [place] [address] [place], [place] [address]       Studies: No results found.  Scheduled Meds: . arformoterol  15 mcg Nebulization BID  . budesonide (PULMICORT) nebulizer solution  0.25 mg Nebulization BID  . cephALEXin  500 mg Oral Q8H  . chlorhexidine  15 mL Mouth Rinse BID  . Chlorhexidine Gluconate Cloth  6 each Topical Daily  . enoxaparin (LOVENOX) injection  30 mg Subcutaneous Q24H  . famotidine  20 mg Oral Daily  . feeding supplement (KATE FARMS STANDARD 1.4)  325 mL Oral BID BM  . furosemide  40 mg Intravenous BID  . gabapentin  100 mg  Oral BID  . ipratropium-albuterol  3 mL Nebulization BID  . mouth rinse  15 mL Mouth Rinse q12n4p  . metoprolol tartrate  25 mg Oral BID  . nicotine  21 mg Transdermal Daily  . pantoprazole  40 mg Oral BID  . potassium chloride  40 mEq Oral BID  . protein supplement  6 g Oral TID WC  . sodium chloride  3 mL Nebulization BID    Continuous Infusions:    LOS: 15 days     Kayleen Memos, MD [facility] Pager [phone]  If 7PM-7AM, please contact night-coverage [email or web address] Password [facility] 09/12/2020, 3:33 PM

## 2020-09-12 NOTE — Care Management Important Message (Signed)
Important Message  Patient Details IM Letter given to the Patient Name: [name] MRN: [id number] Date of Birth: 1951/08/08   Medicare Important Message Given:  Yes     Kerin Salen 09/12/2020, 9:52 AM

## 2020-09-12 NOTE — Plan of Care (Signed)

## 2020-09-12 NOTE — Progress Notes (Signed)
Daily Progress Note   Patient Name: [name] [facility]       Date: 09/12/2020 DOB: 1951-07-08  Age: 69 y.o. MRN#: [id number] Attending Physician: Kayleen Memos, DO Primary Care Physician: Nicholes Rough, PA-C Admit Date: 08/28/2020  Reason for Consultation/Follow-up: Establishing goals of care  Subjective: [name] is noticeably brighter today. Reports she has been able to get out of bed to sit up in the chair. She feels her eating has becoming much easier with dietary changes. Noted cardiology consult- ECHO with wall motion defects- considering ischemic workup.  [name] said she is looking forward to discharge and hoping to start rehab so she can get stronger.   Review of Systems  Constitutional:       Frail  Respiratory: Negative for shortness of breath.     Length of Stay: 15  Current Medications: Scheduled Meds:  . arformoterol  15 mcg Nebulization BID  . budesonide (PULMICORT) nebulizer solution  0.25 mg Nebulization BID  . cephALEXin  500 mg Oral Q8H  . chlorhexidine  15 mL Mouth Rinse BID  . Chlorhexidine Gluconate Cloth  6 each Topical Daily  . enoxaparin (LOVENOX) injection  30 mg Subcutaneous Q24H  . famotidine  20 mg Oral Daily  . feeding supplement (KATE FARMS STANDARD 1.4)  325 mL Oral BID BM  . furosemide  40 mg Intravenous BID  . gabapentin  100 mg Oral BID  . ipratropium-albuterol  3 mL Nebulization BID  . mouth rinse  15 mL Mouth Rinse q12n4p  . metoprolol tartrate  25 mg Oral BID  . nicotine  21 mg Transdermal Daily  . pantoprazole  40 mg Oral BID  . protein supplement  6 g Oral TID WC  . sodium chloride  3 mL Nebulization BID    Continuous Infusions:   PRN Meds: acetaminophen, albuterol, alum & mag hydroxide-simeth, docusate, LORazepam, metoprolol tartrate,  morphine injection, polyethylene glycol, sodium chloride flush, traMADol  Physical Exam Vitals and nursing note reviewed.  Constitutional:      Comments: frail  Pulmonary:     Effort: Pulmonary effort is normal.  Neurological:     Mental Status: She is alert and oriented to person, place, and time.             Vital Signs: BP 119/69 (BP Location: Right Arm)  Pulse 88   Temp 98.4 F (36.9 C)   Resp (!) 22   Ht 5\' 2"  (1.575 m)   Wt 42.8 kg   SpO2 93%   BMI 17.26 kg/m  SpO2: SpO2: 93 % O2 Device: O2 Device: Room Air O2 Flow Rate: O2 Flow Rate (L/min): 0 L/min  Intake/output summary:   Intake/Output Summary (Last 24 hours) at 09/12/2020 1110 Last data filed at 09/12/2020 0902 Gross per 24 hour  Intake 240 ml  Output 1775 ml  Net -1535 ml   LBM: Last BM Date: 09/10/20 Baseline Weight: Weight: 36.3 kg Most recent weight: Weight: 42.8 kg       Palliative Assessment/Data: PPS: 50%     Patient Active Problem List   Diagnosis Date Noted  . Acute respiratory failure ([place])   . Palliative care by specialist   . Goals of care, counseling/discussion   . Advanced care planning/counseling discussion   . Hiatal hernia   . Malnutrition of moderate degree 08/29/2020  . Respiratory failure ([place])   . Community acquired pneumonia of right lower lobe of lung   . Somnolence   . Failure to thrive in adult   . Acute and chr resp failure, unsp w hypoxia or hypercapnia ([place]) 08/28/2020  . Generalized weakness 05/19/2020  . Hypokalemia 05/19/2020  . Hyponatremia 02/03/2020  . Ambulatory dysfunction   . Prediabetes 03/23/2013  . Hyperlipidemia 03/23/2013  . Colon cancer screening 11/18/2012  . Hypertension 09/16/2012  . GERD (gastroesophageal reflux disease) 09/16/2012  . Tobacco use 09/16/2012    Palliative Care Assessment & Plan   Patient Profile: Patient Profile: 69 y.o.femalewith past medical history of hyponatremia of unknown origin presumed to be due to HCTZ  which patient continues to take despite being told not to, hyperkalemia, or malnutrition, hiatal hernia with Schatzki's ring-not amenable to surgeryadmitted on 10/17/2021from home after being found down by her brother. When EMS arrived her O2 sats were in the 28s. She was intubated in the ED,she was extubated on 1023. Chest x-ray showed atelectasis,likely pulmonary interstitial pulmonary edema,and pneumonia questionable aspiration versus HCAP. SLP has been consulted and she has been placed on a dysphagia 3 diet with thin liquids. She is now requiring TPN nutrition. NG tube was attempted to be placed but unable due to hiatal hernia. Her respiratory status continues to be tenuous with tachypnea. Palliative medicine consulted for goals of care.  Assessment/Recommendations/Plan   Continue with current scope of care  Oral intake is improving- TPN has been weaned  GOC- continue to treat what is treatable- hopeful for discharge soon and to start rehab  Recommend outpatient Palliative  Goals of Care and Additional Recommendations:  Limitations on Scope of Treatment: Full Scope Treatment  Code Status:  DNR  Prognosis:   Unable to determine  Discharge Planning:  [name] for rehab with Palliative care service follow-up  Care plan was discussed with patient.   Thank you for allowing the Palliative Medicine Team to assist in the care of this patient.  Total time: 18 mins  Greater than 50%  of this time was spent counseling and coordinating care related to the above assessment and plan.  Mariana Kaufman, AGNP-C Palliative Medicine   Please contact Palliative Medicine Team phone at [phone] for questions and concerns.

## 2020-09-12 NOTE — Progress Notes (Addendum)
Progress Note  Patient Name: [name] Date of Encounter: 09/12/2020  [facility] Cardiologist: New (Dr. Radford Pax)  Subjective   No acute overnight events. Patient reports improvement in breathing. No chest pain.   Mental status improvement from admission but reported [name] was in [place]. Otherwise, oriented.  Inpatient Medications    Scheduled Meds:  arformoterol  15 mcg Nebulization BID   budesonide (PULMICORT) nebulizer solution  0.25 mg Nebulization BID   cephALEXin  500 mg Oral Q8H   chlorhexidine  15 mL Mouth Rinse BID   Chlorhexidine Gluconate Cloth  6 each Topical Daily   enoxaparin (LOVENOX) injection  30 mg Subcutaneous Q24H   famotidine  20 mg Oral Daily   feeding supplement (KATE FARMS STANDARD 1.4)  325 mL Oral BID BM   furosemide  40 mg Intravenous BID   gabapentin  100 mg Oral BID   ipratropium-albuterol  3 mL Nebulization BID   mouth rinse  15 mL Mouth Rinse q12n4p   metoprolol tartrate  25 mg Oral BID   nicotine  21 mg Transdermal Daily   pantoprazole  40 mg Oral BID   protein supplement  6 g Oral TID WC   sodium chloride  3 mL Nebulization BID   Continuous Infusions:   PRN Meds: acetaminophen, albuterol, alum & mag hydroxide-simeth, docusate, LORazepam, metoprolol tartrate, morphine injection, polyethylene glycol, sodium chloride flush, traMADol   Vital Signs    Vitals:   09/11/20 2101 09/12/20 0454 09/12/20 0500 09/12/20 0758  BP: 132/83 119/69    Pulse: 94 88    Resp: 19 (!) 22    Temp: 98.7 F (37.1 C) 98.4 F (36.9 C)    TempSrc: Oral     SpO2: 99% 92%  93%  Weight:   42.8 kg   Height:        Intake/Output Summary (Last 24 hours) at 09/12/2020 0924 Last data filed at 09/12/2020 0902 Gross per 24 hour  Intake 240 ml  Output 1775 ml  Net -1535 ml   Last 3 Weights 09/12/2020 09/11/2020 09/10/2020  Weight (lbs) 94 lb 5.7 oz 100 lb 15.5 oz 100 lb 8.5 oz  Weight (kg) 42.8 kg 45.8 kg 45.6 kg  Some encounter  information is confidential and restricted. Go to Review Flowsheets activity to see all data.      Telemetry    Sinus rhythm with rates in the 80's to 110's. - Personally Reviewed  ECG    No new ECG tracing today. - Personally Reviewed  Physical Exam   GEN: No acute distress.   Neck: Unable to assess JVD due to patient position. Cardiac: Tachycardic with regular rhythm. No murmurs, rubs, or gallops.  Respiratory: Absent/decreased breath sounds in bases. No crackles, wheezes, or rhonchi. GI: Soft, non-tender, non-distended  MS: No lower extremity edema; No deformity. Neuro:  No focal deficits. Psych: Normal affect   Labs    High Sensitivity Troponin:  No results for input(s): TROPONINIHS in the last 720 hours.    Chemistry Recent Labs  Lab 09/07/20 0230 09/07/20 0230 09/08/20 0436 09/08/20 1902 09/09/20 1430 09/09/20 1430 09/10/20 0800 09/11/20 0812 09/12/20 0813  NA 140   < > 136   < > 131*   < > 131* 130* 127*  K 4.5   < > 4.1   < > 3.6   < > 3.6 3.3* 3.5  CL 104   < > 100   < > 90*   < > 86* 83* 83*  CO2 31   < > 31   < > 34*   < > 35* 35* 34*  GLUCOSE 127*   < > 114*   < > 192*   < > 102* 102* 103*  BUN 28*   < > 28*   < > 28*   < > 30* 22 17  CREATININE 0.52   < > 0.45   < > 0.61   < > 0.68 0.68 0.64  CALCIUM 8.2*   < > 7.7*   < > 7.4*   < > 7.6* 7.6* 7.8*  PROT 4.9*  --  4.2*  --  4.5*  --   --   --   --   ALBUMIN 2.2*  --  1.9*  --  1.9*  --   --   --   --   AST 48*  --  37  --  37  --   --   --   --   ALT 48*  --  38  --  36  --   --   --   --   ALKPHOS 213*  --  155*  --  138*  --   --   --   --   BILITOT 0.3  --  0.4  --  0.4  --   --   --   --   GFRNONAA >60   < > >60   < > >60   < > >60 >60 >60  ANIONGAP 5   < > 5   < > 7   < > 10 12 10    < > = values in this interval not displayed.     Hematology Recent Labs  Lab 09/10/20 0800 09/11/20 0812 09/12/20 0813  WBC 22.1* 15.7* 14.5*  RBC 3.07* 2.76* 2.91*  HGB 9.4* 8.3* 8.7*  HCT 27.8* 24.3*  25.8*  MCV 90.6 88.0 88.7  MCH 30.6 30.1 29.9  MCHC 33.8 34.2 33.7  RDW 14.9 14.7 14.6  PLT 408* 486* 565*    BNP Recent Labs  Lab 09/07/20 1701 09/11/20 0812  BNP 3,015.3* 499.0*     DDimer No results for input(s): DDIMER in the last 168 hours.   Radiology    DG Chest 2 View  Result Date: 09/11/2020 CLINICAL DATA:  Weakness.  CHF. EXAM: CHEST - 2 VIEW COMPARISON:  09/07/2020 FINDINGS: Moderate bilateral pleural effusions obscure the hemidiaphragms and portions of the heart border, unchanged from the prior study. There is additional AC opacity at the lung bases that is likely atelectasis. No evidence of pulmonary edema. No pneumothorax. Cardiac silhouette is partly obscured, grossly normal in size. No mediastinal or hilar masses. Left sided PICC is stable, tip in the lower superior vena cava. IMPRESSION: 1. Findings similar to the prior exam. Moderate bilateral pleural effusions with basilar atelectasis. No convincing pneumonia and no pulmonary edema. Electronically Signed   By: [name] M.D.   On: 09/11/2020 11:51   ECHOCARDIOGRAM LIMITED  Result Date: 09/11/2020    ECHOCARDIOGRAM LIMITED REPORT   Patient Name:   [name] Date of Exam: 09/11/2020 Medical Rec #:  [id number]        Height:       62.0 in Accession #:    [id number]       Weight:       101.0 lb Date of Birth:  Dec 24, 1950        BSA:  1.430 m Patient Age:    40 years         BP:           121/71 mmHg Patient Gender: F                HR:           85 bpm. Exam Location:  Inpatient Procedure: Limited Echo and Intracardiac Opacification Agent Indications:    CHF- Acute Diastolic Z66.29; Needs Definity to rule out focal                 wall motion abnormalities.  History:        Patient has prior history of Echocardiogram examinations, most                 recent 09/08/2020. Signs/Symptoms:Murmur; Risk                 Factors:Hypertension.  Sonographer:    [name] RDCS (AE) Referring Phys: [id number] Crystal  R Mcmillan IMPRESSIONS  1. Left ventricular ejection fraction, by estimation, is 60 to 65%. The left ventricle has normal function. There is akinesis of the left ventricular, basal inferior wall. The inferolateral wall appears normal. FINDINGS  Left Ventricle: Left ventricular ejection fraction, by estimation, is 60 to 65%. The left ventricle has normal function. Definity contrast agent was given IV to delineate the left ventricular endocardial borders. Fransico Him MD Electronically signed by Fransico Him MD Signature Date/Time: 09/11/2020/2:23:01 PM    Final     Cardiac Studies   Complete Echo 09/08/2020:  1. Left ventricular ejection fraction, by estimation, is 55%. The left  ventricle has normal function. The left ventricle demonstrates regional  wall motion abnormalities with basal inferior and basal to mid  inferolateral akinesis. Left ventricular  diastolic parameters are consistent with Grade I diastolic dysfunction  (impaired relaxation).   2. Right ventricular systolic function is normal. The right ventricular  size is normal. Tricuspid regurgitation signal is inadequate for assessing  PA pressure.   3. Left atrial size was moderately dilated.   4. The mitral valve is normal in structure. Trivial mitral valve  regurgitation. No evidence of mitral stenosis. Moderate mitral annular  calcification.   5. The aortic valve was not well visualized. Aortic valve regurgitation  is not visualized. No aortic stenosis is present.   6. The inferior vena cava is normal in size with greater than 50%  respiratory variability, suggesting right atrial pressure of 3 mmHg.   7. Technically difficult study with poor acoustic windows. _______________  Limited Echo 09/11/2020:  1. Left ventricular ejection fraction, by estimation, is 60 to 65%. The  left ventricle has normal function. There is akinesis of the left  ventricular, basal inferior wall. The inferolateral wall appears normal.    Patient  Profile     69 y.o. 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 Patient was admitted on 08/28/2020 for acute hypoxic respiratory failure felt to be due to aspiration pneumonia vs CAP after being found down at home. Initially required intubation but was extubated on 09/03/2020. Hospitalization complicated by acute metabolic encephalopathy and AKI. Cardiology consulted for further evaluation of CHF and wall motion abnormalities on Echo at the request of Dr. [place] Crane.  Assessment & Plan    Acute Diastolic CHF - BNP on 47/65 elevated in the 3000's and repeat yesterday 499. - Repeat chest x-ray yesterday showed moderate bilateral pleural effusion (  considered small on 10/27) with basilar atelectasis. No overt pneumonia or pulmonary edema. - S/p right thorcentensis on 10/27 with removal of 550 mL of clear yellow fluid. - Currently on IV Lasix 40mg  twice daily. Documented urinary output of 2.2 L over the last 24 hours but still net positive 2.5 L since admission. Weight down 6 lbs from yesterday if accurate. Renal function stable. - Sodium down to 127 although she does have a history of hyponatremia. - She has already received morning dose of Lasix. Will discuss additional diuresis with MD given sodium level. - Continue to monitor daily weights, strict I/O's, and renal function closely.  Abnormal Echo - Limited Echo on 09/11/2020 confirmed akinesis of basal inferior wall. - No chest pain.  - Will discuss ischemic evaluation with MD.  Acute Hypoxic Respiratory Failure - Secondary to aspiration pneumonia vs CAP. - Initially required intubation but now extubated and on room air. - S/p thoracentesis on 10/27. - Management per primary team.  Hypertension - BP well controlled. - Continue Lopressor 25mg  twice daily.  Otherwise, per primary team. - Lower extremity cellulitis - Hyponatremia - GERD - Severe protein  malnutrition - Dysphagia - Physical debility: SNF recommended  For questions or updates, please contact [facility] Please consult [email or web address] for contact info under        Signed, Darreld Mclean, PA-C  09/12/2020, 9:24 AM    Patient examined chart reviewed discussed care with PA Exam with chronically ill female Chapped lips on oxygen continued rhonchi / exp wheezing Echo review with preserved EF and not impressed with RWMA described No need for inpatient ischemic evaluation continue diuresis with bid lasix given elevated BNP which is improved and pleural effusions. Low albumin contributes to 3rd spacing in setting of hypoxic respiratory failure and intubation from aspiration   Jenkins Rouge MD [facility]

## 2020-09-12 NOTE — Progress Notes (Signed)
Physical Therapy Treatment Patient Details Name: [name] MRN: [id number] DOB: [birth year] Today's Date: 09/12/2020    History of Present Illness 69 yo female admitted after found down with acute on chronic respiratory failure, acute metabolic encephalopathy. Extubated 10/23. Hx of hiatal hernia, hyponatremia, Schatzki's ring    PT Comments    The patient reports left foot is less painful, has had pain medication. Patient tolerated standing and pivot to recliner with mod assistance. Decreased weight on left foot tolerated but did tolerate standing and pivot. Continue PT for progressive mobility..  Follow Up Recommendations  SNF     Equipment Recommendations  None recommended by PT    Recommendations for Other Services       Precautions / Restrictions Precautions Precautions: Fall Precaution Comments: monitor O2    Mobility  Bed Mobility   Bed Mobility: Supine to Sit     Supine to sit: Min assist     General bed mobility comments: min A for trunk support to sitting   Transfers Overall transfer level: Needs assistance Equipment used: None Transfers: Stand Pivot Transfers Sit to Stand: Mod assist Stand pivot transfers: Mod assist       General transfer comment: mod A to power up and safely complete pivot to recliner with limited eccentric control   Ambulation/Gait                 Stairs             Wheelchair Mobility    Modified Rankin (Stroke Patients Only)       Balance Overall balance assessment: Needs assistance Sitting-balance support: Feet supported Sitting balance-Leahy Scale: Fair Sitting balance - Comments: Lean to R side in supported sitting   Standing balance support: During functional activity Standing balance-Leahy Scale: Poor Standing balance comment: reliant on external assistance                            Cognition Arousal/Alertness: Awake/alert Behavior During Therapy: WFL for tasks  assessed/performed Overall Cognitive Status: No family/caregiver present to determine baseline cognitive functioning                                 General Comments: extra time and effort to mobilize      Exercises      General Comments        Pertinent Vitals/Pain Faces Pain Scale: Hurts little more Pain Location: L foot Pain Descriptors / Indicators: Aching Pain Intervention(s): Monitored during session;Repositioned;Premedicated before session    Home Living                      Prior Function            PT Goals (current goals can now be found in the care plan section) Progress towards PT goals: Progressing toward goals    Frequency    Min 2X/week      PT Plan Current plan remains appropriate    Co-evaluation              AM-PAC PT "6 Clicks" Mobility   Outcome Measure  Help needed turning from your back to your side while in a flat bed without using bedrails?: A Little Help needed moving from lying on your back to sitting on the side of a flat bed without using bedrails?: A Little Help needed moving to and from  a bed to a chair (including a wheelchair)?: A Lot Help needed standing up from a chair using your arms (e.g., wheelchair or bedside chair)?: A Lot Help needed to walk in hospital room?: Total Help needed climbing 3-5 steps with a railing? : Total 6 Click Score: 12    End of Session Equipment Utilized During Treatment: Oxygen Activity Tolerance: Patient limited by fatigue Patient left: in chair;with call bell/phone within reach;with chair alarm set Nurse Communication: Mobility status PT Visit Diagnosis: Muscle weakness (generalized) (M62.81);Difficulty in walking, not elsewhere classified (R26.2)     Time: 9824-2998 PT Time Calculation (min) (ACUTE ONLY): 12 min  Charges:  $Therapeutic Activity: 8-22 mins                     Tresa Endo PT Acute Rehabilitation Services Pager [phone] Office  [phone]    Claretha Cooper 09/12/2020, 1:29 PM

## 2020-09-13 DIAGNOSIS — I5031 Acute diastolic (congestive) heart failure: Secondary | ICD-10-CM | POA: Diagnosis not present

## 2020-09-13 LAB — BASIC METABOLIC PANEL
Anion gap: 7 (ref 5–15)
BUN: 14 mg/dL (ref 8–23)
CO2: 37 mmol/L — ABNORMAL HIGH (ref 22–32)
Calcium: 8 mg/dL — ABNORMAL LOW (ref 8.9–10.3)
Chloride: 86 mmol/L — ABNORMAL LOW (ref 98–111)
Creatinine, Ser: 0.68 mg/dL (ref 0.44–1.00)
GFR, Estimated: >60 mL/min (ref >60)
Glucose, Bld: 92 mg/dL (ref 70–99)
Potassium: 4.2 mmol/L (ref 3.5–5.1)
Sodium: 130 mmol/L — ABNORMAL LOW (ref 135–145)

## 2020-09-13 LAB — GLUCOSE, CAPILLARY: Glucose-Capillary: 102 mg/dL — ABNORMAL HIGH (ref 70–99)

## 2020-09-13 MED ORDER — HYDROXYZINE HCL 25 MG PO TABS
25.0000 mg | ORAL_TABLET | Freq: Three times a day (TID) | ORAL | Status: DC | PRN
  Administered 2020-09-14: 25 mg via ORAL
  Filled 2020-09-13: qty 1

## 2020-09-13 NOTE — Progress Notes (Signed)
PROGRESS NOTE  [name] ATF:[id number] DOB: Jun 16, 1951 DOA: 08/28/2020 PCP: Nicholes Rough, PA-C  HPI/Recap of past 24 hours: PCCM transfer for 09/06/2020:  69 year old female with history of hiatal hernia, Schatzki's ring, SIADH, tobacco use disorder, who was found down at home by her brother after she was last known normal the day PTA.  When EMS arrived, she was hypoxic in the sixties and had difficulty breathing.  She was placed on NRB, and when assessed in the ED, decision was made to intubate.  Admitted to ICU for acute respiratory failure with hypoxia 2/2 to aspiration pneumonia versus CAP, acute metabolic encephalopathy and acute kidney injury.  Extubated 09/03/20 and transferred to telemetry 09/06/20.  Due to persistent tachypnea, PCCM reevaluated on 09/06/20.  She lives alone, manages her own meds. She has had substantial weight loss over the last 1 year.  Hospital course complicated by hypoxemia.  Work up revealed volume overload, B/L pleural effusions R>L, and pulmonary edema.  BNP>3000 which prompted 2D echo- abnormal- Cards following- No plan for invasive intervention.  Post right thoracentesis by IR with greater than 500 cc removed on 09/07/20.  Ongoing diuresing with IV lasix.  Also complicated by left foot cellulitis which is resolving on Keflex.  MRSA screen negative.   09/13/20: No new complaints.  Mild intermittent cough, much improved from previous a few days ago.  Ongoing diuresing.  Net I&O +701cc   Assessment/Plan: Active Problems:   Hyponatremia   Acute and chr resp failure, unsp w hypoxia or hypercapnia (HCC)   Malnutrition of moderate degree   Hiatal hernia   Acute respiratory failure (HCC)   Palliative care by specialist   Goals of care, counseling/discussion   Advanced care planning/counseling discussion   CHF (congestive heart failure) ([place])  Resolved acute hypoxic hypercarbic respiratory failure likely multifactorial secondary to bilateral pleural  effusions, mild pulmonary edema, possible aspiration, community-acquired pneumonia She presented from home severely hypoxic, was intubated on presentation 08/28/2020 and extubated on 09/03/2020. Not on oxygen supplementation at baseline  Her hypoxemia has resolved, O2 saturation 99% on room air. Latest ABG with FiO2 44, 7.463/48.1/131 She has cold fingers, pulse oxymetry when placed on her fingers not always accurate for O2 sats, CBGs also not always accurate for same reason. Encourage to Use incentive spirometer q1H when awake and flutter valve q4H when awake to improve B/L pleural effusions seen on her last CXR Continue IV lasix as rec by cardiology  Bilateral pleural effusions likely from 3rd spacing in the setting of hypoalbuminemia Personally reviewed last CXR 09/11/20, showing B/L pleural effusions C/w IV diuretic + net I&O but improved IS and flutter valve as stated above  Acute diastolic CHF Bilateral pleural effusion, pulmonary edema, BNP greater than 3000 Abnormal 2D echo done on 09/08/2020 showed regional wall motion abnormalities with basal inferior and basal to mid inferior lateral akinesis.  Left ventricular diastolic parameters are consistent with grade 1 diastolic dysfunction.  Left atrial size was moderately dilated.  Moderate mitral annular calcification. Ongoing diuresing IV Lasix 40 mg BID- Net I&O +9.4L>> +2.5L on 09/12/20>> +701CC on 09/13/20 C/w strict I's and O's and daily weight Cardiology following  Electrolytes derangement 2/2 diuretic Resolved Hypokalemia  Replace K+ while on Lasix Follow K+ and mg2+ levels when on IV diuretics  Hx of SIADH Acute drop in Na+ level improving to 130 from 127. Continue to monitor serum Na+ while on diuretic  Improving left foot cellulitis WBC 21.5>> 15.7>> 14.5K. C/w Keflex 500 mg 3 times  daily day 4/10 days Follow blood cultures x2 negative to date. MRSA negative Erythema, tenderness and warmth resolving Repeat CBC  AM  GERD Continue PPI Last EGD 03/22/2015 w rec to take daily PPI C/w close monitoring  Anxiety, situational Requested Ativan as needed Switch to hydroxyzine as needed  Resolved Hypokalemia Serum potassium 3.3>3.5> 4.2  Post right thoracentesis R thoracentesis by IR with greater than 500 cc removed BNP greater than 3000 on 09/07/2020>> 499 on 09/11/20 Continue IV Lasix 40 mg BID  Resolving Elevated liver chemistries, improving Trend alkaline phosphatase, AST and ALT LFTs are downtrending Continue to avoid hepatotoxic agents  Essential hypertension BP is at goal  Severe protein calorie malnutrition BMI 20 Albumin 1.9 Severe muscle mass loss Encourage increase in oral protein calorie intake Oral supplement Off TPN since 09/10/20  Dysphagia, improved Seen by speech therapist with recommendation for regular consistency diet and thin liquid Continue aspiration precaution  Physical debility PT assessment recommended SNF Continue PT OT with assistance of all the questions TOC assisting with SNF placement.  Goals of care Palliative care team following and assisting with establishing goals of care Currently she is DNR PMT:DNR with the caveat that patient is agreeable to ventilator if needed if her problem is respiratory only. -Continue current scope of care -No feeding tube -[name] is agreeable to SNF placement rehab short-term but but would not want long-term.     Code Status: DNR  Family Communication: None at bedside  Disposition Plan: SNF when cards signs off   Consultants:  PCCM  Palliative care medicine  Procedures: Intubation Extubation  Antimicrobials:  Keflex 09/09/20>>>  DVT prophylaxis: Subcu Lovenox daily  Status is: Inpatient    Dispo: The patient is from: Home.              Anticipated d/c is to: SNF.               Anticipated d/c date is: 09/14/2020.              Patient currently not stable for discharge due to ongoing  evaluation and management of acute diastolic CHF, ongoing diuresing.       Objective: Vitals:   09/12/20 1956 09/13/20 0339 09/13/20 0500 09/13/20 1303  BP:  136/84  (!) 148/83  Pulse:  80  84  Resp:  18  16  Temp:  97.7 F (36.5 C)  98.2 F (36.8 C)  TempSrc:  Oral  Oral  SpO2: 96% 99%  (!) 73%  Weight:   44 kg   Height:        Intake/Output Summary (Last 24 hours) at 09/13/2020 1753 Last data filed at 09/13/2020 1524 Gross per 24 hour  Intake 369 ml  Output 1400 ml  Net -1031 ml   Filed Weights   09/11/20 0500 09/12/20 0500 09/13/20 0500  Weight: 45.8 kg 42.8 kg 44 kg    Exam:  . General: 69 y.o. year-old female frail chronically ill-appearing no acute distress.  Pleasant alert and oriented x3.   . Cardiovascular: Regular rate and rhythm no rubs or gallops.  Marland Kitchen Respiratory: Clear to auscultation no wheezes no rales. . Abdomen: Soft nontender normal bowel sounds present.  . Musculoskeletal: No lower extremity edema bilaterally.  Left foot cellulitis nearly resolved. Marland Kitchen Psychiatry: Mood is appropriate for condition and setting.  Data Reviewed: CBC: Recent Labs  Lab 09/07/20 1701 09/07/20 1701 09/08/20 0758 09/09/20 0843 09/10/20 0800 09/11/20 0812 09/12/20 0813  WBC 22.6*   < > 19.6* 21.5*  22.1* 15.7* 14.5*  NEUTROABS 19.5*  --   --  19.0* 18.5* 12.2* 10.8*  HGB 8.9*   < > 8.7* 7.9* 9.4* 8.3* 8.7*  HCT 27.7*   < > 26.0* 23.5* 27.8* 24.3* 25.8*  MCV 94.5   < > 92.9 93.3 90.6 88.0 88.7  PLT 359   < > 358 369 408* 486* 565*   < > = values in this interval not displayed.   Basic Metabolic Panel: Recent Labs  Lab 09/07/20 0230 09/07/20 0230 09/08/20 0436 09/08/20 1902 09/09/20 1430 09/10/20 0800 09/11/20 0812 09/12/20 0813 09/13/20 0430  NA 140   < > 136   < > 131* 131* 130* 127* 130*  K 4.5   < > 4.1   < > 3.6 3.6 3.3* 3.5 4.2  CL 104   < > 100   < > 90* 86* 83* 83* 86*  CO2 31   < > 31   < > 34* 35* 35* 34* 37*  GLUCOSE 127*   < > 114*   < > 192*  102* 102* 103* 92  BUN 28*   < > 28*   < > 28* 30* 22 17 14   CREATININE 0.52   < > 0.45   < > 0.61 0.68 0.68 0.64 0.68  CALCIUM 8.2*   < > 7.7*   < > 7.4* 7.6* 7.6* 7.8* 8.0*  MG 2.1  --  2.0  --   --   --   --  2.0  --   PHOS 3.3  --  3.4  --   --   --   --   --   --    < > = values in this interval not displayed.   GFR: Estimated Creatinine Clearance: 47.4 mL/min (by C-G formula based on SCr of 0.68 mg/dL). Liver Function Tests: Recent Labs  Lab 09/07/20 0230 09/08/20 0436 09/09/20 1430  AST 48* 37 37  ALT 48* 38 36  ALKPHOS 213* 155* 138*  BILITOT 0.3 0.4 0.4  PROT 4.9* 4.2* 4.5*  ALBUMIN 2.2* 1.9* 1.9*   No results for input(s): LIPASE, AMYLASE in the last 168 hours. No results for input(s): AMMONIA in the last 168 hours. Coagulation Profile: No results for input(s): INR, PROTIME in the last 168 hours. Cardiac Enzymes: No results for input(s): CKTOTAL, CKMB, CKMBINDEX, TROPONINI in the last 168 hours. BNP (last 3 results) No results for input(s): PROBNP in the last 8760 hours. HbA1C: No results for input(s): HGBA1C in the last 72 hours. CBG: Recent Labs  Lab 09/08/20 1718 09/08/20 1827 09/08/20 1832 09/08/20 2009 09/12/20 2117  GLUCAP 68* 32* 41* 40* 95   Lipid Profile: No results for input(s): CHOL, HDL, LDLCALC, TRIG, CHOLHDL, LDLDIRECT in the last 72 hours. Thyroid Function Tests: No results for input(s): TSH, T4TOTAL, FREET4, T3FREE, THYROIDAB in the last 72 hours. Anemia Panel: No results for input(s): VITAMINB12, FOLATE, FERRITIN, TIBC, IRON, RETICCTPCT in the last 72 hours. Urine analysis:    Component Value Date/Time   COLORURINE YELLOW 08/28/2020 2022   APPEARANCEUR HAZY (A) 08/28/2020 2022   LABSPEC 1.012 08/28/2020 2022   PHURINE 5.0 08/28/2020 2022   GLUCOSEU 50 (A) 08/28/2020 2022   HGBUR MODERATE (A) 08/28/2020 2022   BILIRUBINUR NEGATIVE 08/28/2020 2022   KETONESUR 5 (A) 08/28/2020 2022   PROTEINUR 30 (A) 08/28/2020 2022   UROBILINOGEN  0.2 05/25/2011 1337   NITRITE NEGATIVE 08/28/2020 2022   LEUKOCYTESUR TRACE (A) 08/28/2020 2022   Sepsis Labs: @  LABRCNTIP(procalcitonin:4,lacticidven:4)  ) Recent Results (from the past 240 hour(s))  Body fluid culture     Status: None   Collection Time: 09/07/20  3:57 PM   Specimen: PATH Cytology Pleural fluid  Result Value Ref Range Status   Specimen Description   Final    PLEURAL Performed at [facility] [address] [place], [place] [address]    Special Requests   Final    NONE Performed at [facility], [place] [address] [place], [place] [address]    Gram Stain   Final    WBC PRESENT,BOTH PMN AND MONONUCLEAR NO ORGANISMS SEEN CYTOSPIN SMEAR    Culture   Final    NO GROWTH Performed at [facility], [place] [address], [place], [place] [address]    Report Status 09/10/2020 FINAL  Final  Culture, blood (routine x 2)     Status: None (Preliminary result)   Collection Time: 09/10/20  7:58 AM   Specimen: BLOOD  Result Value Ref Range Status   Specimen Description   Final    BLOOD RIGHT ARM Performed at [facility] [address], [place], [place] [address]    Special Requests   Final    BOTTLES DRAWN AEROBIC ONLY Blood Culture adequate volume Performed at [facility] [address] [place], [place] [address]    Culture   Final    NO GROWTH 3 DAYS Performed at [facility], [place] [address] [place], [place] [address]    Report Status PENDING  Incomplete  Culture, blood (routine x 2)     Status: None (Preliminary result)   Collection Time: 09/10/20  7:59 AM   Specimen: BLOOD  Result Value Ref Range Status   Specimen Description   Final    BLOOD RIGHT ARM Performed at [facility] [address], [place], [place] [address]    Special Requests   Final    BOTTLES DRAWN AEROBIC ONLY Blood Culture adequate volume Performed at [facility] [address] [place], [place] [address]    Culture   Final    NO GROWTH 3 DAYS Performed at [facility], [place] [address] [place], [place] [address]    Report Status PENDING  Incomplete  MRSA PCR Screening     Status: None   Collection Time: 09/10/20 11:46 AM   Specimen: Nasopharyngeal Wash  Result Value Ref Range Status   MRSA by PCR NEGATIVE NEGATIVE Final    Comment:        The GeneXpert MRSA Assay (FDA approved for NASAL specimens only), is one component of a comprehensive MRSA colonization surveillance program. It is not intended to diagnose MRSA infection nor to guide or monitor treatment for MRSA infections. Performed at [facility], [place] [address], [place], [place] [address]       Studies: No results found.  Scheduled Meds: . cephALEXin  500 mg Oral Q8H  . chlorhexidine  15 mL Mouth Rinse BID  . Chlorhexidine Gluconate Cloth  6 each Topical Daily  . enoxaparin (LOVENOX) injection  30 mg Subcutaneous Q24H  . famotidine  20 mg Oral Daily  . feeding supplement ([employer] STANDARD 1.4)  325 mL Oral BID BM  . furosemide  40 mg Intravenous BID  . gabapentin  100 mg Oral BID  . ipratropium-albuterol  3 mL Nebulization BID  . mouth rinse  15 mL Mouth Rinse q12n4p  .  metoprolol tartrate  25 mg Oral BID  . nicotine  21 mg Transdermal Daily  . pantoprazole  40 mg Oral BID  . potassium chloride  40 mEq Oral BID  . protein supplement  6 g Oral TID WC    Continuous Infusions:    LOS: 16 days     Kayleen Memos, MD [facility] Pager [phone]  If 7PM-7AM, please contact night-coverage [email or web address] Password [facility] 09/13/2020, 5:53 PM

## 2020-09-13 NOTE — TOC Progression Note (Signed)
Transition of Care [facility]) - Progression Note    Patient Details  Name: [name] MRN: [id number] Date of Birth: [birth year]  Transition of Care [facility]) CM/SW [place], [place] Phone Number: 09/13/2020, 12:31 PM  Clinical Narrative:   Damaris Schooner with Perrin Smack at [facility].  Patient has still not been reviewed by [employer]-have not even had a reviewer assigned yet.  They promised her it would be assigned by end of day.  In the meantime, she will let me know if she needs any more notes or anything else to update reviewer when it is assigned. TOC will continue to follow during the course of hospitalization.     Expected Discharge Plan: Skilled Nursing Facility Barriers to Discharge: Other (comment) ([facility])  Expected Discharge Plan and Services Expected Discharge Plan: [place]   Discharge Planning Services: CM Consult   Living arrangements for the past 2 months: Apartment                                       Social Determinants of Health (SDOH) Interventions    Readmission Risk Interventions No flowsheet data found.

## 2020-09-13 NOTE — Progress Notes (Signed)
Occupational Therapy Treatment Patient Details Name: [name] MRN: [id number] DOB: [birth year] Today's Date: 09/13/2020    History of present illness 69 yo female admitted after found down with acute on chronic respiratory failure, acute metabolic encephalopathy. Extubated 10/23. Hx of hiatal hernia, hyponatremia, Schatzki's ring   OT comments  Patient showed no observable progress today towards OT goals, but is willing to participate with EOB and OOB ADLs and functional mobility with increased time and max encouragement.   Patient limited by cognitive impairments/decreased judgment, decreased awareness of impairments, severe generalized weakness with posterior LOB in standing and RT lateral LOB in sitting EOBB, along with deficits noted below. Pt continues to demonstrate good rehab potential and would benefit from continued skilled OT to increase safety and independence with ADLs and functional transfers to allow pt to return home safely and reduce caregiver burden and fall risk.   Follow Up Recommendations  SNF;Supervision/Assistance - 24 hour    Equipment Recommendations       Recommendations for Other Services      Precautions / Restrictions Precautions Precautions: Fall Restrictions Weight Bearing Restrictions: No       Mobility Bed Mobility Overal bed mobility: Needs Assistance Bed Mobility: Supine to Sit     Supine to sit: Min assist     General bed mobility comments: Pt with poor initiation to task. Needed increased time and verbal cues to begin mobility. Began with Max As to bring LEs to EOB, and Max As at trunk, however pt suddenly pulled her trunk off bed without assistance.  Transfers Overall transfer level: Needs assistance Equipment used: Rolling walker (2 wheeled) Transfers: Sit to/from [facility] Sit to Stand: Mod assist Stand pivot transfers: Max assist       General transfer comment: Pt initially refused RW, and stood from EOB  with poor balance and posterior loss of balance, and lowered back to EOB. Pt agreed to RW with ed that it may help LT ankle pain. Pt stood 2nd time with Mod As and required Max As to pivot to chair due to LEs buckling and posterior LOB. Max As for eccentric control to recliner.    Balance Overall balance assessment: Needs assistance Sitting-balance support: Feet supported;Single extremity supported Sitting balance-Leahy Scale: Fair Sitting balance - Comments: Leaning far to RT side. Postural control: Right lateral lean Standing balance support: During functional activity;Bilateral upper extremity supported Standing balance-Leahy Scale: Poor Standing balance comment: reliant on external assistance and RW.                           ADL either performed or assessed with clinical judgement   ADL Overall ADL's : Needs assistance/impaired Eating/Feeding: Set up;Sitting   Grooming: Set up;Sitting Grooming Details (indicate cue type and reason): Verbal cues to initiate.         Upper Body Dressing : Min guard;Set up;Sitting Upper Body Dressing Details (indicate cue type and reason): Min guard at EOB due to mild confusion. Lower Body Dressing: Total assistance;Sit to/from stand;+2 for physical assistance Lower Body Dressing Details (indicate cue type and reason): Pt required BUE support on RW with very poor standing balance, and wouldn require total assist of 2nd person to perform LE dressing task in standing.   Toilet Transfer Details (indicate cue type and reason): See Mobility section for pivot to recliner.         Functional mobility during ADLs: Cueing for safety;Cueing for sequencing;Maximal assistance;Minimal assistance General ADL  Comments: Min Assist supine to sit. Max As for stand pivot to chair with RW.     Vision   Vision Assessment?: No apparent visual deficits   Perception     Praxis      Cognition Arousal/Alertness: Awake/alert Behavior During Therapy:  Flat affect Overall Cognitive Status: No family/caregiver present to determine baseline cognitive functioning                                 General Comments: Delayed processing, difficulty responding appropriately to questions and staying on topic.        Exercises Other Exercises Other Exercises: Pt educated on importance of moving her arms and LEs ad lib while in bed to maintain strength and endurance. Pt declined HEP today due to lunch arriving.   Shoulder Instructions       General Comments      Pertinent Vitals/ Pain       Faces Pain Scale: Hurts a little bit Pain Location: L foot Pain Intervention(s): Limited activity within patient's tolerance;Monitored during session;Repositioned  Home Living                                          Prior Functioning/Environment              Frequency  Min 2X/week        Progress Toward Goals  OT Goals(current goals can now be found in the care plan section)  Progress towards OT goals: Not progressing toward goals - comment (See Clinical impression.)  Acute Rehab OT Goals OT Goal Formulation: With patient Time For Goal Achievement: 09/22/20 Potential to Achieve Goals: Good  Plan Discharge plan remains appropriate    Co-evaluation                 AM-PAC OT "6 Clicks" Daily Activity     Outcome Measure   Help from another person eating meals?: A Little Help from another person taking care of personal grooming?: A Little Help from another person toileting, which includes using toliet, bedpan, or urinal?: A Lot Help from another person bathing (including washing, rinsing, drying)?: A Little Help from another person to put on and taking off regular upper body clothing?: A Little Help from another person to put on and taking off regular lower body clothing?: A Lot 6 Click Score: 16    End of Session Equipment Utilized During Treatment: Gait belt;Rolling walker  OT Visit  Diagnosis: Other abnormalities of gait and mobility (R26.89);Muscle weakness (generalized) (M62.81);History of falling (Z91.81);Pain Pain - Right/Left: Left Pain - part of body: Ankle and joints of foot   Activity Tolerance Patient tolerated treatment well   Patient Left in chair;with call bell/phone within reach;with chair alarm set   Nurse Communication  (Nurse in room as OT beginning and aware of LT ankle pain.)        Time: 0867-6195 OT Time Calculation (min): 29 min  Charges: OT General Charges $OT Visit: 1 Visit OT Treatments $Self Care/Home Management : 8-22 mins $Therapeutic Activity: 8-22 mins  Anderson [place], [place] Office: [phone] 09/13/2020    Julien Girt 09/13/2020, 12:42 PM

## 2020-09-13 NOTE — Progress Notes (Addendum)
Progress Note  Patient Name: [name] [facility] Date of Encounter: 09/13/2020  [facility] Cardiologist: Fransico Him, MD  Subjective   No acute overnight events. Patient states breathing has improved. No significant shortness of breath. No chest pain or palpitations. Her main concern this morning is why her sodium continues to be low.  Inpatient Medications    Scheduled Meds:  cephALEXin  500 mg Oral Q8H   chlorhexidine  15 mL Mouth Rinse BID   Chlorhexidine Gluconate Cloth  6 each Topical Daily   enoxaparin (LOVENOX) injection  30 mg Subcutaneous Q24H   famotidine  20 mg Oral Daily   feeding supplement (KATE FARMS STANDARD 1.4)  325 mL Oral BID BM   furosemide  40 mg Intravenous BID   gabapentin  100 mg Oral BID   ipratropium-albuterol  3 mL Nebulization BID   mouth rinse  15 mL Mouth Rinse q12n4p   metoprolol tartrate  25 mg Oral BID   nicotine  21 mg Transdermal Daily   pantoprazole  40 mg Oral BID   potassium chloride  40 mEq Oral BID   protein supplement  6 g Oral TID WC   Continuous Infusions:   PRN Meds: acetaminophen, albuterol, alum & mag hydroxide-simeth, docusate, LORazepam, metoprolol tartrate, morphine injection, polyethylene glycol, sodium chloride flush, traMADol   Vital Signs    Vitals:   09/12/20 1941 09/12/20 1956 09/13/20 0339 09/13/20 0500  BP: 130/77  136/84   Pulse: 93  80   Resp: 18  18   Temp: 98.3 F (36.8 C)  97.7 F (36.5 C)   TempSrc: Oral  Oral   SpO2: 98% 96% 99%   Weight:    44 kg  Height:        Intake/Output Summary (Last 24 hours) at 09/13/2020 0845 Last data filed at 09/13/2020 0500 Gross per 24 hour  Intake 360 ml  Output 1950 ml  Net -1590 ml   Last 3 Weights 09/13/2020 09/12/2020 09/11/2020  Weight (lbs) 97 lb 94 lb 5.7 oz 100 lb 15.5 oz  Weight (kg) 44 kg 42.8 kg 45.8 kg  Some encounter information is confidential and restricted. Go to Review Flowsheets activity to see all data.      Telemetry    Normal sinus  rhythm with rates in the 70's to 90's. Occasional PVC/couplets noted. - Personally Reviewed  ECG    No new ECG tracing today. - Personally Reviewed  Physical Exam   GEN: No acute distress.   Neck: Supple. Cardiac: RRR. Soft murmur noted. No rubs or gallops. Radial and distal pedal pulses 2+ and equal bilaterally. Respiratory: No increased work of breathing. Mild decreased breath sounds in bases but improved from yesterday. No wheezes, rhonchi, or rales.  GI: Soft, non-tender, non-distended  MS: No lower extremity edema. No deformity. Skin: Warm and dry. Neuro:  No focal deficits. Psych: Normal affect. Responds appropriately.  Labs    High Sensitivity Troponin:  No results for input(s): TROPONINIHS in the last 720 hours.    Chemistry Recent Labs  Lab 09/07/20 0230 09/07/20 0230 09/08/20 0436 09/08/20 1902 09/09/20 1430 09/10/20 0800 09/11/20 0812 09/12/20 0813 09/13/20 0430  NA 140   < > 136   < > 131*   < > 130* 127* 130*  K 4.5   < > 4.1   < > 3.6   < > 3.3* 3.5 4.2  CL 104   < > 100   < > 90*   < > 83*  83* 86*  CO2 31   < > 31   < > 34*   < > 35* 34* 37*  GLUCOSE 127*   < > 114*   < > 192*   < > 102* 103* 92  BUN 28*   < > 28*   < > 28*   < > 22 17 14   CREATININE 0.52   < > 0.45   < > 0.61   < > 0.68 0.64 0.68  CALCIUM 8.2*   < > 7.7*   < > 7.4*   < > 7.6* 7.8* 8.0*  PROT 4.9*  --  4.2*  --  4.5*  --   --   --   --   ALBUMIN 2.2*  --  1.9*  --  1.9*  --   --   --   --   AST 48*  --  37  --  37  --   --   --   --   ALT 48*  --  38  --  36  --   --   --   --   ALKPHOS 213*  --  155*  --  138*  --   --   --   --   BILITOT 0.3  --  0.4  --  0.4  --   --   --   --   GFRNONAA >60   < > >60   < > >60   < > >60 >60 >60  ANIONGAP 5   < > 5   < > 7   < > 12 10 7    < > = values in this interval not displayed.     Hematology Recent Labs  Lab 09/10/20 0800 09/11/20 0812 09/12/20 0813  WBC 22.1* 15.7* 14.5*  RBC 3.07* 2.76* 2.91*  HGB 9.4* 8.3* 8.7*  HCT 27.8* 24.3*  25.8*  MCV 90.6 88.0 88.7  MCH 30.6 30.1 29.9  MCHC 33.8 34.2 33.7  RDW 14.9 14.7 14.6  PLT 408* 486* 565*    BNP Recent Labs  Lab 09/07/20 1701 09/11/20 0812  BNP 3,015.3* 499.0*     DDimer No results for input(s): DDIMER in the last 168 hours.   Radiology    DG Chest 2 View  Result Date: 09/11/2020 CLINICAL DATA:  Weakness.  CHF. EXAM: CHEST - 2 VIEW COMPARISON:  09/07/2020 FINDINGS: Moderate bilateral pleural effusions obscure the hemidiaphragms and portions of the heart border, unchanged from the prior study. There is additional AC opacity at the lung bases that is likely atelectasis. No evidence of pulmonary edema. No pneumothorax. Cardiac silhouette is partly obscured, grossly normal in size. No mediastinal or hilar masses. Left sided PICC is stable, tip in the lower superior vena cava. IMPRESSION: 1. Findings similar to the prior exam. Moderate bilateral pleural effusions with basilar atelectasis. No convincing pneumonia and no pulmonary edema. Electronically Signed   By: Lajean Manes M.D.   On: 09/11/2020 11:51   ECHOCARDIOGRAM LIMITED  Result Date: 09/11/2020    ECHOCARDIOGRAM LIMITED REPORT   Patient Name:   [name] [facility] Date of Exam: 09/11/2020 Medical Rec #:  [id number]        Height:       62.0 in Accession #:    [id number]       Weight:       101.0 lb Date of Birth:  November 25, 1950        BSA:  1.430 m Patient Age:    69 years         BP:           121/71 mmHg Patient Gender: F                HR:           85 bpm. Exam Location:  Inpatient Procedure: Limited Echo and Intracardiac Opacification Agent Indications:    CHF- Acute Diastolic V03.50; Needs Definity to rule out focal                 wall motion abnormalities.  History:        Patient has prior history of Echocardiogram examinations, most                 recent 09/08/2020. Signs/Symptoms:Murmur; Risk                 Factors:Hypertension.  Sonographer:    Mikki Santee RDCS (AE) Referring Phys: [id number] TRACI  R TURNER IMPRESSIONS  1. Left ventricular ejection fraction, by estimation, is 60 to 65%. The left ventricle has normal function. There is akinesis of the left ventricular, basal inferior wall. The inferolateral wall appears normal. FINDINGS  Left Ventricle: Left ventricular ejection fraction, by estimation, is 60 to 65%. The left ventricle has normal function. Definity contrast agent was given IV to delineate the left ventricular endocardial borders. Fransico Him MD Electronically signed by Fransico Him MD Signature Date/Time: 09/11/2020/2:23:01 PM    Final     Cardiac Studies     Complete Echo 09/08/2020:  1. Left ventricular ejection fraction, by estimation, is 55%. The left  ventricle has normal function. The left ventricle demonstrates regional  wall motion abnormalities with basal inferior and basal to mid  inferolateral akinesis. Left ventricular  diastolic parameters are consistent with Grade I diastolic dysfunction  (impaired relaxation).   2. Right ventricular systolic function is normal. The right ventricular  size is normal. Tricuspid regurgitation signal is inadequate for assessing  PA pressure.   3. Left atrial size was moderately dilated.   4. The mitral valve is normal in structure. Trivial mitral valve  regurgitation. No evidence of mitral stenosis. Moderate mitral annular  calcification.   5. The aortic valve was not well visualized. Aortic valve regurgitation  is not visualized. No aortic stenosis is present.   6. The inferior vena cava is normal in size with greater than 50%  respiratory variability, suggesting right atrial pressure of 3 mmHg.   7. Technically difficult study with poor acoustic windows. _______________   Limited Echo 09/11/2020:  1. Left ventricular ejection fraction, by estimation, is 60 to 65%. The  left ventricle has normal function. There is akinesis of the left  ventricular, basal inferior wall. The inferolateral wall appears normal.   Patient  Profile     69 y.o. 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 Patient was admitted on 08/28/2020 for acute hypoxic respiratory failure felt to be due to aspiration pneumonia vs CAP after being found down at home. Initially required intubation but was extubated on 09/03/2020. Hospitalization complicated by acute metabolic encephalopathy and AKI. Cardiology consulted for further evaluation of CHF and wall motion abnormalities on Echo at the request of Dr. Nevada Crane.  Assessment & Plan    Acute Diastolic CHF - BNP on 09/38 elevated in the 3000's and repeat on 10/31 499. - Repeat chest x-ray on 10/31 showed  moderate bilateral pleural effusion (considered small on 10/27) with basilar atelectasis. No overt pneumonia or pulmonary edema. - S/p right thorcentensis on 10/27 with removal of 550 mL of clear yellow fluid. - Currently on IV Lasix 40mg  twice daily. Documented urinary output of 1.95 L over the last 24 hours but still net positive 1.3 L since admission. Don't think weights during this admission are accurate. - Continue current dose of IV Lasix. Hyponatremia improved from yesterday. - Continue to monitor daily weights, strict I/O's, and renal function closely.   Abnormal Echo - Limited Echo on 09/11/2020 confirmed akinesis of basal inferior wall. - No chest pain.  - Dr. Johnsie Cancel reviewed Echo and did not think regional wall motion abnormality was that significant. No plans for inpatient ischemic evaluation.   Acute Hypoxic Respiratory Failure - Secondary to aspiration pneumonia vs CAP. - Initially required intubation but now extubated and on room air. - S/p thoracentesis on 10/27. - Management per primary team.   Hypertension - BP well controlled. - Continue Lopressor 25mg  twice daily.   Otherwise, per primary team. - Lower extremity cellulitis - Hyponatremia - GERD - Severe protein malnutrition/low  albumin - Dysphagia - Physical debility: SNF recommended  For questions or updates, please contact [place] Please consult [email or web address] for contact info under        Signed, Darreld Mclean, PA-C  09/13/2020, 8:45 AM    Patient examined chart reviewed Can probably change to PO lasix in next 48 hours f/u CXR in am for pleural effusions slow clinical progress in regard to volume status  Jenkins Rouge MD [facility]

## 2020-09-14 LAB — CBC WITH DIFFERENTIAL/PLATELET
Abs Immature Granulocytes: 0.03 10*3/uL (ref 0.00–0.07)
Basophils Absolute: 0.1 10*3/uL (ref 0.0–0.1)
Basophils Relative: 1 %
Eosinophils Absolute: 0.1 10*3/uL (ref 0.0–0.5)
Eosinophils Relative: 1 %
HCT: 22.8 % — ABNORMAL LOW (ref 36.0–46.0)
Hemoglobin: 7.6 g/dL — ABNORMAL LOW (ref 12.0–15.0)
Immature Granulocytes: 0 %
Lymphocytes Relative: 25 %
Lymphs Abs: 1.9 10*3/uL (ref 0.7–4.0)
MCH: 29.9 pg (ref 26.0–34.0)
MCHC: 33.3 g/dL (ref 30.0–36.0)
MCV: 89.8 fL (ref 80.0–100.0)
Monocytes Absolute: 0.7 10*3/uL (ref 0.1–1.0)
Monocytes Relative: 10 %
Neutro Abs: 4.6 10*3/uL (ref 1.7–7.7)
Neutrophils Relative %: 63 %
Platelets: 543 10*3/uL — ABNORMAL HIGH (ref 150–400)
RBC: 2.54 MIL/uL — ABNORMAL LOW (ref 3.87–5.11)
RDW: 14.6 % (ref 11.5–15.5)
WBC: 7.4 10*3/uL (ref 4.0–10.5)
nRBC: 0 % (ref 0.0–0.2)

## 2020-09-14 LAB — BASIC METABOLIC PANEL
Anion gap: 6 (ref 5–15)
BUN: 13 mg/dL (ref 8–23)
CO2: 38 mmol/L — ABNORMAL HIGH (ref 22–32)
Calcium: 7.9 mg/dL — ABNORMAL LOW (ref 8.9–10.3)
Chloride: 86 mmol/L — ABNORMAL LOW (ref 98–111)
Creatinine, Ser: 0.66 mg/dL (ref 0.44–1.00)
GFR, Estimated: >60 mL/min (ref >60)
Glucose, Bld: 133 mg/dL — ABNORMAL HIGH (ref 70–99)
Potassium: 3.7 mmol/L (ref 3.5–5.1)
Sodium: 130 mmol/L — ABNORMAL LOW (ref 135–145)

## 2020-09-14 MED ORDER — DOCUSATE SODIUM 50 MG/5ML PO LIQD: 100.0000 mg | Freq: Two times a day (BID) | ORAL | 0 refills | Status: DC | PRN

## 2020-09-14 MED ORDER — FUROSEMIDE 20 MG PO TABS
20.0000 mg | ORAL_TABLET | Freq: Every day | ORAL | Status: DC
  Administered 2020-09-14: 20 mg via ORAL
  Filled 2020-09-14: qty 1

## 2020-09-14 MED ORDER — RESOURCE INSTANT PROTEIN PO PWD PACKET
6.0000 g | Freq: Three times a day (TID) | ORAL | Status: DC
Start: 2020-09-14 — End: 2020-10-03

## 2020-09-14 MED ORDER — TRAMADOL HCL 50 MG PO TABS: 50.0000 mg | ORAL_TABLET | Freq: Two times a day (BID) | ORAL | Status: DC | PRN

## 2020-09-14 MED ORDER — FUROSEMIDE 20 MG PO TABS
20.0000 mg | ORAL_TABLET | Freq: Every day | ORAL | Status: DC
Start: 2020-09-15 — End: 2020-10-03

## 2020-09-14 MED ORDER — POTASSIUM CHLORIDE 20 MEQ/15ML (10%) PO SOLN: 40.0000 meq | Freq: Two times a day (BID) | ORAL | 0 refills | Status: DC

## 2020-09-14 MED ORDER — PANTOPRAZOLE SODIUM 40 MG PO TBEC
40.0000 mg | DELAYED_RELEASE_TABLET | Freq: Two times a day (BID) | ORAL | Status: DC
Start: 2020-09-14 — End: 2020-10-03

## 2020-09-14 MED ORDER — HYDROXYZINE HCL 25 MG PO TABS: 25.0000 mg | ORAL_TABLET | Freq: Three times a day (TID) | ORAL | 0 refills | Status: DC | PRN

## 2020-09-14 MED ORDER — GABAPENTIN 100 MG PO CAPS
100.0000 mg | ORAL_CAPSULE | Freq: Two times a day (BID) | ORAL | Status: DC
Start: 2020-09-14 — End: 2020-10-03

## 2020-09-14 MED ORDER — METOPROLOL TARTRATE 25 MG PO TABS
25.0000 mg | ORAL_TABLET | Freq: Two times a day (BID) | ORAL | Status: DC
Start: 2020-09-14 — End: 2020-10-03

## 2020-09-14 MED ORDER — KATE FARMS STANDARD 1.4 PO LIQD
325.0000 mL | Freq: Two times a day (BID) | ORAL | Status: DC
Start: 2020-09-14 — End: 2020-09-15

## 2020-09-14 MED ORDER — TRAMADOL HCL 50 MG PO TABS: 50.0000 mg | ORAL_TABLET | Freq: Two times a day (BID) | ORAL | 0 refills | Status: DC | PRN

## 2020-09-14 MED ORDER — FAMOTIDINE 20 MG PO TABS
20.0000 mg | ORAL_TABLET | Freq: Every day | ORAL | Status: DC
Start: 2020-09-15 — End: 2020-10-03

## 2020-09-14 MED ORDER — NICOTINE 21 MG/24HR TD PT24: 21.0000 mg | MEDICATED_PATCH | Freq: Every day | TRANSDERMAL | 0 refills | Status: DC

## 2020-09-14 MED ORDER — ALUM & MAG HYDROXIDE-SIMETH 200-200-20 MG/5ML PO SUSP: 30.0000 mL | ORAL | 0 refills | Status: AC | PRN

## 2020-09-14 MED ORDER — POLYETHYLENE GLYCOL 3350 17 G PO PACK: 17.0000 g | PACK | Freq: Every day | ORAL | 0 refills | Status: DC | PRN

## 2020-09-14 MED ORDER — ACETAMINOPHEN 325 MG PO TABS: 650.0000 mg | ORAL_TABLET | Freq: Four times a day (QID) | ORAL | Status: DC | PRN

## 2020-09-14 NOTE — TOC Transition Note (Addendum)
Transition of Care [facility]) - CM/SW Discharge Note   Patient Details  Name: [name] MRN: [id number] Date of Birth: 02/02/1951  [facility] CM/SW Contact:  Trish Mage, LCSW Phone Number: 09/14/2020, 11:04 AM   Clinical Narrative:   Received call from [place] at [facility] saying that insurance authorization has been received.  OK to transfer today.  MD alerted. Brother alerted.  PTAR arranged.  Nursing, please call [phone], room 119 for report. TOC sign off.    Final next level of care: Skilled Nursing Facility Barriers to Discharge: Barriers Resolved   Patient Goals and CMS Choice Patient states their goals for this hospitalization and ongoing recovery are:: unable to state on vent      Discharge Placement                       Discharge Plan and Services   Discharge Planning Services: CM Consult                                 Social Determinants of Health (SDOH) Interventions     Readmission Risk Interventions No flowsheet data found.

## 2020-09-14 NOTE — Discharge Instructions (Signed)
Community-Acquired Pneumonia, Adult Pneumonia is a type of lung infection that causes swelling in the airways of the lungs. Mucus and fluid may also build up inside the airways. This may cause coughing and difficulty breathing. There are different types of pneumonia. One type can develop while a person is in a hospital. A different type is called community-acquired pneumonia. It develops in people who are not, and have not recently been, in the hospital or another type of health care facility. What are the causes? This condition may be caused by:  Viruses. This is the most common cause of pneumonia.  Bacteria. Community-acquired pneumonia is often caused by Streptococcus pneumoniae bacteria. These bacteria are often passed from one person to another by breathing in droplets from the cough or sneeze of an infected person.  Fungi. This is the least common cause of pneumonia. What increases the risk? The following factors may make you more likely to develop this condition:  Having a chronic disease, such as chronic obstructive pulmonary disease (COPD), asthma, congestive heart failure, cystic fibrosis, diabetes, or kidney disease.  Having early-stage or late-stage HIV.  Having sickle cell disease.  Having had your spleen removed (splenectomy).  Having poor dental hygiene.  Having a medical condition that increases the risk of breathing in (aspirating) secretions from your own mouth and nose.  Having a weakened body defense system (immune system).  Being a smoker.  Traveling to areas where pneumonia-causing germs commonly exist.  Being around animal habitats or animals that have pneumonia-causing germs, including birds, bats, rabbits, cats, and farm animals. What are the signs or symptoms? Symptoms of this condition include:  A dry cough.  A wet (productive) cough.  Fever.  Sweating.  Chest pain, especially when breathing deeply or coughing.  Rapid breathing or difficulty  breathing.  Shortness of breath.  Shaking chills.  Fatigue.  Muscle aches. How is this diagnosed? This condition may be diagnosed based on:  Your medical history.  A physical exam. You may also have tests, including:  Chest X-rays.  Tests of your blood oxygen level and other blood gases.  Tests on blood, mucus (sputum), fluid around your lungs (pleural fluid), and urine. If your pneumonia is severe, other tests may be done to find the exact cause of your illness. How is this treated? Treatment for this condition depends on many factors, such as the cause of your pneumonia, the medicines you take, and other medical conditions that you have. For most adults, treatment and recovery from pneumonia may occur at home. In some cases, treatment must happen in a hospital. Treatment may include:  Medicines that are given by mouth or through an IV, including: ? Antibiotic medicines, if the pneumonia was caused by bacteria. ? Antiviral medicines, if the pneumonia was caused by a virus.  Being given extra oxygen.  Respiratory therapy. Although rare, treating severe pneumonia may include:  Using a machine to help you breathe (mechanical ventilation). This is done if you are not breathing well on your own and you cannot maintain a safe blood oxygen level.  Thoracentesis. This is a procedure to remove fluid from around one lung or both lungs to help you breathe better. Follow these instructions at home:  Medicines  Take over-the-counter and prescription medicines only as told by your health care provider. ? Only take cough medicine if you are losing sleep. Be aware that cough medicine can prevent your body's natural ability to remove mucus from your lungs.  If you were prescribed an antibiotic   medicine, take it as told by your health care provider. Do not stop taking the antibiotic even if you start to feel better. General instructions  Sleep in a semi-upright position at night. Try  sleeping in a reclining chair, or place a few pillows under your head.  Rest as needed and get at least 8 hours of sleep each night.  Drink enough water to keep your urine pale yellow. This will help to thin out mucus secretions in your lungs.  Eat a healthy diet that includes plenty of vegetables, fruits, whole grains, low-fat dairy products, and lean protein.  Do not use any products that contain nicotine or tobacco, such as cigarettes, e-cigarettes, and chewing tobacco. If you need help quitting, ask your health care provider.  Keep all follow-up visits as told by your health care provider. This is important. How is this prevented? You can lower your risk of developing community-acquired pneumonia by:  Getting a pneumococcal vaccine. There are different types and schedules of pneumococcal vaccines. Ask your health care provider which option is best for you. Consider getting the vaccine if: ? You are older than 67 years of age. ? You are older than 67 years of age and are undergoing cancer treatment, have chronic lung disease, or have other medical conditions that affect your immune system. Ask your health care provider if this applies to you.  Getting an influenza vaccine every year. Ask your health care provider which type of vaccine is best for you.  Getting regular checkups from your dentist.  Washing your hands often. If soap and water are not available, use hand sanitizer. Contact a health care provider if:  You have a fever.  You are losing sleep because you cannot control your cough with cough medicine. Get help right away if:  You have worsening shortness of breath.  You have increased chest pain.  Your sickness becomes worse, especially if you are an older adult or have a weakened immune system.  You cough up blood. Summary  Pneumonia is an infection of the lungs.  Community-acquired pneumonia develops in people who have not been in the hospital. It can be caused  by bacteria, viruses, or fungi.  This condition may be treated with antibiotics or antiviral medicines.  Severe cases may require hospitalization, mechanical ventilation, and other procedures to drain fluid from the lungs. This information is not intended to replace advice given to you by your health care provider. Make sure you discuss any questions you have with your health care provider. Document Revised: 06/26/2018 Document Reviewed: 06/26/2018 [employer]vier Patient Education  2020 [employer]  

## 2020-09-14 NOTE — Discharge Summary (Signed)
DISCHARGE SUMMARY  Shifra Swartzentruber [facility]  MR#: [id number]  DOB:08/29/1951  Date of Admission: 08/28/2020 Date of Discharge: 09/14/2020  Attending Physician:Phuong Hillary Hennie Duos, MD  Patient's ALP:FXTK, Barbera Setters, PA-C  Consults: PCCM   Disposition: D/C to SNF    Follow-up Appts:  Contact information for after-discharge care    Destination    [facility] Preferred SNF .   Service: Skilled Nursing Contact information: [address] [address] [phone]                  Tests Needing Follow-up: -Monitor potassium -Monitor volume status with potential need to titrate diuretic -Monitor sodium level with history of SIADH and use of diuretic -Monitor left foot with serial exam w/ recent tx for cellulitis  Discharge Diagnoses: Multifactorial acute hypoxic and hypercarbic respiratory failure Aspiration versus community-acquired pneumonia Bilateral pleural effusions Acute diastolic CHF exacerbation -pulmonary edema Hypokalemia History of SIADH Left foot cellulitis GERD Situational anxiety HTN Severe protein calorie malnutrition Physical debility  Initial presentation: 69 year old with a history of hiatal hernia, Schatzki's ring, SIADH, and tobacco abuse who was found down at home by her brother after having been seen normal the day prior.  EMS found her to be hypoxic in the 60s and clearly dyspneic.  Upon arrival in the ED she was intubated and admitted to the ICU with aspiration versus community-acquired pneumonia, acute metabolic encephalopathy, and acute kidney injury.  Hospital Course: 10/17 to ED via EMS -intubated -admit to ICU 10/23 extubated 10/26 transfer to telemetry bed  Multifactorial acute hypoxic and hypercarbic respiratory failure Hypoxia and hypercarbia now resolved - see contributing details listed below - on RA at time of d/c   Aspiration versus community-acquired pneumonia Completed a course of abx  tx during this admission - no overt signs of aspiration on SLP eval therefore diet not modified   Bilateral pleural effusions Felt to be due to third spacing in the setting of severe hypoalbuminemia -status post right-sided thoracentesis with >500 cc removed - cont diuretic use and close monitoring   Acute diastolic CHF exacerbation -pulmonary edema Grade 1 diastolic CHF per TTE 68/34/1962 -continue diuretic and follow volume status closely as outpt   Hypokalemia Corrected with supplementation - will need to be monitored intermittently in setting of ongoing diuretic use   History of SIADH Monitor sodium intermittently with ongoing use of diuretic  Left foot cellulitis Has completed 5 days of Keflex -clinically resolved - no indication for ongoing abx use at this time   GERD Continue usual PPI  Situational anxiety As needed hydroxyzine  HTN Blood pressure well controlled at time of discharge  Severe protein calorie malnutrition  Physical debility PT/OT recommend SNF placement   Allergies as of 09/14/2020      Reactions   Iodine Shortness Of Breath, Rash   Thiazide-type Diuretics Other (See Comments)   Severe hyponatremia July 2021   Shellfish Allergy    Causes rash      Medication List    STOP taking these medications   amLODipine 10 MG tablet Commonly known as: NORVASC   baclofen 10 MG tablet Commonly known as: LIORESAL   bisoprolol 5 MG tablet Commonly known as: ZEBETA   traZODone 50 MG tablet Commonly known as: DESYREL     TAKE these medications   acetaminophen 325 MG tablet Commonly known as: TYLENOL Take 2 tablets (650 mg total) by mouth every 6 (six) hours as needed for mild pain, moderate pain, fever or headache.  alum & mag hydroxide-simeth 200-200-20 MG/5ML suspension Commonly known as: MAALOX/MYLANTA Take 30 mLs by mouth every 4 (four) hours as needed for indigestion or heartburn.   docusate 50 MG/5ML liquid Commonly known as:  COLACE Take 10 mLs (100 mg total) by mouth 2 (two) times daily as needed for mild constipation.   famotidine 20 MG tablet Commonly known as: PEPCID Take 1 tablet (20 mg total) by mouth daily. Start taking on: September 15, 2020   feeding supplement (KATE FARMS STANDARD 1.4) Liqd liquid Take 325 mLs by mouth 2 (two) times daily between meals.   furosemide 20 MG tablet Commonly known as: LASIX Take 1 tablet (20 mg total) by mouth daily. Start taking on: September 15, 2020   gabapentin 100 MG capsule Commonly known as: NEURONTIN Take 1 capsule (100 mg total) by mouth 2 (two) times daily.   hydrOXYzine 25 MG tablet Commonly known as: ATARAX/VISTARIL Take 1 tablet (25 mg total) by mouth 3 (three) times daily as needed for anxiety.   metoprolol tartrate 25 MG tablet Commonly known as: LOPRESSOR Take 1 tablet (25 mg total) by mouth 2 (two) times daily.   nicotine 21 mg/24hr patch Commonly known as: NICODERM CQ - dosed in mg/24 hours Place 1 patch (21 mg total) onto the skin daily. Start taking on: September 15, 2020   pantoprazole 40 MG tablet Commonly known as: PROTONIX Take 1 tablet (40 mg total) by mouth 2 (two) times daily.   polyethylene glycol 17 g packet Commonly known as: MIRALAX / GLYCOLAX Take 17 g by mouth daily as needed for moderate constipation.   potassium chloride 20 MEQ/15ML (10%) Soln Take 30 mLs (40 mEq total) by mouth 2 (two) times daily.   protein supplement 6 g Powd Commonly known as: RESOURCE BENEPROTEIN Take 1 Scoop (6 g total) by mouth 3 (three) times daily with meals.   traMADol 50 MG tablet Commonly known as: ULTRAM Take 1 tablet (50 mg total) by mouth every 12 (twelve) hours as needed for severe pain. What changed:   when to take this  reasons to take this       Day of Discharge BP 118/72 (BP Location: Right Arm)   Pulse 84   Temp 98 F (36.7 C) (Oral)   Resp (!) 21   Ht 5\' 2"  (1.575 m)   Wt 42.2 kg   SpO2 (!) 73%   BMI 17.02 kg/m    Physical Exam: General: No acute respiratory distress Lungs: Clear to auscultation bilaterally without wheezes or crackles Cardiovascular: Regular rate and rhythm without murmur gallop or rub normal S1 and S2 Abdomen: Nontender, nondistended, soft, bowel sounds positive, no rebound, no ascites, no appreciable mass Extremities: No significant cyanosis, clubbing, or edema bilateral lower extremities  Basic Metabolic Panel: Recent Labs  Lab 09/08/20 0436 09/08/20 1902 09/10/20 0800 09/11/20 0812 09/12/20 0813 09/13/20 0430 09/14/20 0415  NA 136   < > 131* 130* 127* 130* 130*  K 4.1   < > 3.6 3.3* 3.5 4.2 3.7  CL 100   < > 86* 83* 83* 86* 86*  CO2 31   < > 35* 35* 34* 37* 38*  GLUCOSE 114*   < > 102* 102* 103* 92 133*  BUN 28*   < > 30* 22 17 14 13   CREATININE 0.45   < > 0.68 0.68 0.64 0.68 0.66  CALCIUM 7.7*   < > 7.6* 7.6* 7.8* 8.0* 7.9*  MG 2.0  --   --   --  2.0  --   --   PHOS 3.4  --   --   --   --   --   --    < > = values in this interval not displayed.    Liver Function Tests: Recent Labs  Lab 09/08/20 0436 09/09/20 1430  AST 37 37  ALT 38 36  ALKPHOS 155* 138*  BILITOT 0.4 0.4  PROT 4.2* 4.5*  ALBUMIN 1.9* 1.9*    CBC: Recent Labs  Lab 09/09/20 0843 09/10/20 0800 09/11/20 0812 09/12/20 0813 09/14/20 0415  WBC 21.5* 22.1* 15.7* 14.5* 7.4  NEUTROABS 19.0* 18.5* 12.2* 10.8* 4.6  HGB 7.9* 9.4* 8.3* 8.7* 7.6*  HCT 23.5* 27.8* 24.3* 25.8* 22.8*  MCV 93.3 90.6 88.0 88.7 89.8  PLT 369 408* 486* 565* 543*    BNP (last 3 results) Recent Labs    02/10/20 0756 09/07/20 1701 09/11/20 0812  BNP 547.1* 3,015.3* 499.0*    CBG: Recent Labs  Lab 09/08/20 1827 09/08/20 1832 09/08/20 2009 09/12/20 2117 09/13/20 2115  GLUCAP 32* 41* 40* 95 102*    Recent Results (from the past 240 hour(s))  Body fluid culture     Status: None   Collection Time: 09/07/20  3:57 PM   Specimen: PATH Cytology Pleural fluid  Result Value Ref Range Status   Specimen  Description   Final    PLEURAL Performed at [facility], [place] [address] [place], [place] [address]    Special Requests   Final    NONE Performed at [facility], [place] [address], [place], [place] [address]    Gram Stain   Final    WBC PRESENT,BOTH PMN AND MONONUCLEAR NO ORGANISMS SEEN CYTOSPIN SMEAR    Culture   Final    NO GROWTH Performed at [facility], [place] [address] [place], [place] [address]    Report Status 09/10/2020 FINAL  Final  Culture, blood (routine x 2)     Status: None (Preliminary result)   Collection Time: 09/10/20  7:58 AM   Specimen: BLOOD  Result Value Ref Range Status   Specimen Description   Final    BLOOD RIGHT ARM Performed at [place] [address] [place], [place] [address]    Special Requests   Final    BOTTLES DRAWN AEROBIC ONLY Blood Culture adequate volume Performed at [place] [address], [place], [place] [address]    Culture   Final    NO GROWTH 4 DAYS Performed at [facility], [place] [address], [place], [place] [address]    Report Status PENDING  Incomplete  Culture, blood (routine x 2)     Status: None (Preliminary result)   Collection Time: 09/10/20  7:59 AM   Specimen: BLOOD  Result Value Ref Range Status   Specimen Description   Final    BLOOD RIGHT ARM Performed at [place] [address] [place], [place] [address]    Special Requests   Final    BOTTLES DRAWN AEROBIC ONLY Blood Culture adequate volume Performed at [place] [address] [place], [place] [address]    Culture   Final    NO GROWTH 4 DAYS Performed at [facility], [place] [address] [place], [place] [address]    Report Status PENDING  Incomplete  MRSA PCR Screening     Status: None   Collection Time: 09/10/20 11:46 AM  Specimen: Nasopharyngeal Wash  Result Value Ref Range Status   MRSA by PCR NEGATIVE  NEGATIVE Final    Comment:        The GeneXpert MRSA Assay (FDA approved for NASAL specimens only), is one component of a comprehensive MRSA colonization surveillance program. It is not intended to diagnose MRSA infection nor to guide or monitor treatment for MRSA infections. Performed at [facility], [place] [address] [place], [place] [address]       Time spent in discharge (includes decision making & examination of pt): 35 minutes  09/14/2020, 11:25 AM   Cherene Altes, MD [facility] Office  [phone]

## 2020-09-14 NOTE — Progress Notes (Signed)
Progress Note  Patient Name: [name] Date of Encounter: 09/14/2020  [facility] Cardiologist: Fransico Him, MD  Subjective   Continues to do well Weak when trying to ambulate  Needs rehab  Inpatient Medications    Scheduled Meds: . cephALEXin  500 mg Oral Q8H  . chlorhexidine  15 mL Mouth Rinse BID  . Chlorhexidine Gluconate Cloth  6 each Topical Daily  . enoxaparin (LOVENOX) injection  30 mg Subcutaneous Q24H  . famotidine  20 mg Oral Daily  . feeding supplement (KATE FARMS STANDARD 1.4)  325 mL Oral BID BM  . furosemide  20 mg Oral Daily  . gabapentin  100 mg Oral BID  . mouth rinse  15 mL Mouth Rinse q12n4p  . metoprolol tartrate  25 mg Oral BID  . nicotine  21 mg Transdermal Daily  . pantoprazole  40 mg Oral BID  . potassium chloride  40 mEq Oral BID  . protein supplement  6 g Oral TID WC   Continuous Infusions:  PRN Meds: acetaminophen, albuterol, alum & mag hydroxide-simeth, docusate, hydrOXYzine, metoprolol tartrate, morphine injection, polyethylene glycol, sodium chloride flush, traMADol   Vital Signs    Vitals:   09/13/20 1957 09/13/20 2311 09/14/20 0356 09/14/20 0500  BP: 136/80 (!) 154/94 118/72   Pulse: 95 96 84   Resp: 18  (!) 21   Temp: 98.3 F (36.8 C)  98 F (36.7 C)   TempSrc:   Oral   SpO2:      Weight:    42.2 kg  Height:        Intake/Output Summary (Last 24 hours) at 09/14/2020 0846 Last data filed at 09/14/2020 0544 Gross per 24 hour  Intake 739 ml  Output 2650 ml  Net -1911 ml   Last 3 Weights 09/14/2020 09/13/2020 09/12/2020  Weight (lbs) 93 lb 0.6 oz 97 lb 94 lb 5.7 oz  Weight (kg) 42.2 kg 44 kg 42.8 kg  Some encounter information is confidential and restricted. Go to Review Flowsheets activity to see all data.      Telemetry    Normal sinus rhythm with rates in the 70's to 90's. Occasional PVC/couplets noted. - Personally Reviewed  ECG    No new ECG tracing today. - Personally Reviewed  Physical Exam    Affect appropriate Chronically ill white female  HEENT: chapped lips healing  Neck supple with no adenopathy JVP normal no bruits no thyromegaly Lungs clear with no wheezing and good diaphragmatic motion Heart:  S1/S2 no murmur, no rub, gallop or click PMI normal Abdomen: benighn, BS positve, no tenderness, no AAA no bruit.  No HSM or HJR Distal pulses intact with no bruits No edema Neuro non-focal LLE cellulitis improved with duoderm in place no erythema No muscular weakness Central line under right clavicle     Labs    High Sensitivity Troponin:  No results for input(s): TROPONINIHS in the last 720 hours.    Chemistry Recent Labs  Lab 09/08/20 0436 09/08/20 1902 09/09/20 1430 09/10/20 0800 09/12/20 0813 09/13/20 0430 09/14/20 0415  NA 136   < > 131*   < > 127* 130* 130*  K 4.1   < > 3.6   < > 3.5 4.2 3.7  CL 100   < > 90*   < > 83* 86* 86*  CO2 31   < > 34*   < > 34* 37* 38*  GLUCOSE 114*   < > 192*   < > 103* 92  133*  BUN 28*   < > 28*   < > 17 14 13   CREATININE 0.45   < > 0.61   < > 0.64 0.68 0.66  CALCIUM 7.7*   < > 7.4*   < > 7.8* 8.0* 7.9*  PROT 4.2*  --  4.5*  --   --   --   --   ALBUMIN 1.9*  --  1.9*  --   --   --   --   AST 37  --  37  --   --   --   --   ALT 38  --  36  --   --   --   --   ALKPHOS 155*  --  138*  --   --   --   --   BILITOT 0.4  --  0.4  --   --   --   --   GFRNONAA >60   < > >60   < > >60 >60 >60  ANIONGAP 5   < > 7   < > 10 7 6    < > = values in this interval not displayed.     Hematology Recent Labs  Lab 09/11/20 0812 09/12/20 0813 09/14/20 0415  WBC 15.7* 14.5* 7.4  RBC 2.76* 2.91* 2.54*  HGB 8.3* 8.7* 7.6*  HCT 24.3* 25.8* 22.8*  MCV 88.0 88.7 89.8  MCH 30.1 29.9 29.9  MCHC 34.2 33.7 33.3  RDW 14.7 14.6 14.6  PLT 486* 565* 543*    BNP Recent Labs  Lab 09/07/20 1701 09/11/20 0812  BNP 3,015.3* 499.0*     DDimer No results for input(s): DDIMER in the last 168 hours.   Radiology    No results  found.  Cardiac Studies     Complete Echo 09/08/2020:  1. Left ventricular ejection fraction, by estimation, is 55%. The left  ventricle has normal function. The left ventricle demonstrates regional  wall motion abnormalities with basal inferior and basal to mid  inferolateral akinesis. Left ventricular  diastolic parameters are consistent with Grade I diastolic dysfunction  (impaired relaxation).   2. Right ventricular systolic function is normal. The right ventricular  size is normal. Tricuspid regurgitation signal is inadequate for assessing  PA pressure.   3. Left atrial size was moderately dilated.   4. The mitral valve is normal in structure. Trivial mitral valve  regurgitation. No evidence of mitral stenosis. Moderate mitral annular  calcification.   5. The aortic valve was not well visualized. Aortic valve regurgitation  is not visualized. No aortic stenosis is present.   6. The inferior vena cava is normal in size with greater than 50%  respiratory variability, suggesting right atrial pressure of 3 mmHg.   7. Technically difficult study with poor acoustic windows. _______________   Limited Echo 09/11/2020:  1. Left ventricular ejection fraction, by estimation, is 60 to 65%. The  left ventricle has normal function. There is akinesis of the left  ventricular, basal inferior wall. The inferolateral wall appears normal.   Patient Profile     69 y.o. 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 Patient was admitted on 08/28/2020 for acute hypoxic respiratory failure felt to be due to aspiration pneumonia vs CAP after being found down at home. Initially required intubation but was extubated on 09/03/2020. Hospitalization complicated by acute metabolic encephalopathy and AKI. Cardiology consulted for further evaluation of CHF and wall motion  abnormalities on Echo at the request of Dr.  N[place] Crane.  Assessment & Plan    Acute Diastolic CHF - BNP on 19/75 elevated in the 3000's and repeat on 10/31 499. - Repeat chest x-ray on 10/31 showed moderate bilateral pleural effusion (considered small on 10/27) with basilar atelectasis. No overt pneumonia or pulmonary edema. - S/p right thorcentensis on 10/27 with removal of 550 mL of clear yellow fluid. - euvolemic currently change to lasix 20 mg daily and follow I/O's    Abnormal Echo - Limited Echo on 09/11/2020 confirmed akinesis of basal inferior wall. - No chest pain.  - Reviewed Echo and did not think regional wall motion abnormality was that significant. No plans for inpatient ischemic evaluation.   Acute Hypoxic Respiratory Failure - Secondary to aspiration pneumonia vs CAP. - Initially required intubation but now extubated and on room air. - S/p thoracentesis on 10/27. - Management per primary team.   Hypertension - BP well controlled. - Continue Lopressor 25mg  twice daily.   Otherwise, per primary team. - Lower extremity cellulitis - Hyponatremia - GERD - Severe protein malnutrition/low albumin - Dysphagia - Physical debility: SNF recommended  Cardiology will sign off  For questions or updates, please contact [place] Please consult [email or web address] for contact info under      Signed, Jenkins Rouge, MD  09/14/2020, 8:46 AM

## 2020-09-15 ENCOUNTER — Non-Acute Institutional Stay (INDEPENDENT_AMBULATORY_CARE_PROVIDER_SITE_OTHER): Payer: Self-pay | Admitting: Internal Medicine

## 2020-09-15 ENCOUNTER — Encounter: Payer: Self-pay | Admitting: Internal Medicine

## 2020-09-15 DIAGNOSIS — N179 Acute kidney failure, unspecified: Secondary | ICD-10-CM

## 2020-09-15 DIAGNOSIS — J9601 Acute respiratory failure with hypoxia: Secondary | ICD-10-CM

## 2020-09-15 DIAGNOSIS — E440 Moderate protein-calorie malnutrition: Secondary | ICD-10-CM

## 2020-09-15 DIAGNOSIS — J9602 Acute respiratory failure with hypercapnia: Secondary | ICD-10-CM

## 2020-09-15 DIAGNOSIS — R627 Adult failure to thrive: Secondary | ICD-10-CM

## 2020-09-15 DIAGNOSIS — I5031 Acute diastolic (congestive) heart failure: Secondary | ICD-10-CM

## 2020-09-15 HISTORY — DX: Acute kidney failure, unspecified: N17.9

## 2020-09-15 LAB — CULTURE, BLOOD (ROUTINE X 2)
Culture: NO GROWTH
Culture: NO GROWTH
Special Requests: ADEQUATE
Special Requests: ADEQUATE

## 2020-09-15 NOTE — Assessment & Plan Note (Signed)
Nutrition consult with supplementation. PT/OT at SNF.

## 2020-09-15 NOTE — Patient Instructions (Signed)
See assessment and plan under each diagnosis in the problem list and acutely for this visit 

## 2020-09-15 NOTE — Assessment & Plan Note (Signed)
Nutrition consult

## 2020-09-15 NOTE — Assessment & Plan Note (Addendum)
Monitor closely for electrolyte imbalance because of history of SIADH with severe hyponatremia &  hypokalemia with diuretics.

## 2020-09-15 NOTE — Progress Notes (Signed)
NURSING HOME LOCATION:  [facility] ROOM NUMBER:  119/A  CODE STATUS:  DNR  PCP:  Nicholes Rough, PA-C   This is a comprehensive admission note to [facility] performed on this date less than 30 days from date of admission. Included are preadmission medical/surgical history; reconciled medication list; family history; social history and comprehensive review of systems.  Corrections and additions to the records were documented. Comprehensive physical exam was also performed. Additionally a clinical summary was entered for each active diagnosis pertinent to this admission in the Problem List to enhance continuity of care.  HPI: Patient was hospitalized 10/17-11/01/2020 with multifactorial acute hypoxic and hypercarbic respiratory failure in the context of aspiration versus community-acquired pneumonia.  This was associated with acute diastolic congestive heart failure with pulmonary edema. She was found down at home by her brother a day after apparently exhibiting no symptoms.  EMS documented hypoxia with O2 sats in the 60s with associated dyspnea.  Upon arrival in the ED she was intubated and transferred to the ICU. Also present were AKI, hypokalemia,severe hypoalbuminemia and left foot cellulitis.  Diuresis was continued; electrolyte monitoring was indicated as she has a history of SIADH with diuretic induced hyponatremia. The hypoalbunemia was felt to be contributing to bilateral pleural effusions.  Right thoracentesis resulted in removal of > 500 cc.  Effusion and pulmonary edema was attributed to acute diastolic congestive heart failure exacerbation.  TTE revealed grade 1 diastolic CHF. Patient was extubated on 10/23 and transferred to a telemetry bed 10/26. Speech therapy did not document overt aspiration and diet was not modified. Full course of antibiotics for CAP & foot cellulitis were completed during the hospitalization.   Discharge to SNF was recommended because of physical  debility.  Past medical and surgical history: Includes history of ulcer, chronic GERD, hiatal hernia/Schatzki's ring, thiazide induced hyponatremia, essential hypertension, and anxiety. Surgical procedures include hysterectomy , right TKA, and shoulder surgery.  Social history: Former social alcohol consumption;former half a pack a day smoker until 28 days ago. She is presently on NicoDerm.  Family history: Reviewed   Review of systems: She validates having fallen several times at home prior to the admission.  She had no definite cardiac or neurologic prodrome prior to the falls.  She feels that this may have been related to low potassium which she feels causes weakness, headache, and loss of appetite.  Significant dyspepsia remains an issue.  She feels as if the left foot is swollen without definite neuropathic symptoms.  Constitutional: No fever, significant weight change Eyes: No redness, discharge, pain, vision change ENT/mouth: No nasal congestion, purulent discharge, earache, change in hearing, sore throat  Cardiovascular: No chest pain, palpitations, paroxysmal nocturnal dyspnea, claudication, edema  Respiratory: No cough, sputum production, hemoptysis,  significant snoring, apnea  Gastrointestinal: No dysphagia, abdominal pain, nausea /vomiting, rectal bleeding, melena, change in bowels Genitourinary: No dysuria, hematuria, pyuria, incontinence, nocturia Musculoskeletal: No joint stiffness, joint swelling, pain Dermatologic: No rash, pruritus, change in appearance of skin Neurologic: No seizures, numbness, tingling Psychiatric: No significant anxiety, depression, insomnia Endocrine: No change in hair/skin/nails, excessive thirst, excessive hunger, excessive urination  Hematologic/lymphatic: No significant bruising, lymphadenopathy, abnormal bleeding Allergy/immunology: No itchy/watery eyes, significant sneezing, urticaria, angioedema  Physical exam:  Pertinent or positive findings:  She appears chronically malnourished and frankly cachectic.  Hair is disheveled.  Dental  work is immaculate surprisingly. Marked thoracic spine kyphosis present. Breath sounds are decreased at the bases.  She has minor rales superiorly.  A rumbling  grade 4-2.5 systolic murmur is noted at the right base.  The second heart sound is increased.  Pedal pulses are decreased.  She has trace edema at the ankles.  Limbs as noted are atrophic.  There is interosseous wasting.  She has weakness to opposition in all limbs.  There is a suggestion of bunion versus osteoma at the base of the right great toe.  She has keratotic hyperpigmented changes with eschar over the left shin.  Fingernails are short & deformed.  General appearance: no acute distress, increased work of breathing is present.   Lymphatic: No lymphadenopathy about the head, neck, axilla. Eyes: No conjunctival inflammation or lid edema is present. There is no scleral icterus. Ears:  External ear exam shows no significant lesions or deformities.   Nose:  External nasal examination shows no deformity or inflammation. Nasal mucosa are pink and moist without lesions, exudates Oral exam: Lips and gums are healthy appearing.There is no oropharyngeal erythema or exudate. Neck:  No thyromegaly, masses, tenderness noted.    Heart:  Normal rate and regular rhythm. S1 normal . No gallop, click, rub.  Lungs: without wheezes, rhonchi,  rubs. Abdomen: Bowel sounds are normal.  Abdomen is soft and nontender with no organomegaly, hernias, masses. GU: Deferred  Extremities:  No cyanosis, clubbing. Neurologic exam: Balance, Rhomberg, finger to nose testing could not be completed due to clinical state Deep tendon reflexes are equal Skin: Warm & dry w/o tenting. No significant rash.  See clinical summary under each active problem in the Problem List with associated updated therapeutic plan

## 2020-09-15 NOTE — Assessment & Plan Note (Signed)
Clinically stable at present.  Clinically she has advanced COPD.

## 2020-09-15 NOTE — Assessment & Plan Note (Signed)
09/14/2020 creat 0.66/ GFR > 60

## 2020-09-20 ENCOUNTER — Encounter: Payer: Self-pay | Admitting: Internal Medicine

## 2020-09-20 DIAGNOSIS — R29818 Other symptoms and signs involving the nervous system: Secondary | ICD-10-CM | POA: Insufficient documentation

## 2020-09-20 DIAGNOSIS — R4189 Other symptoms and signs involving cognitive functions and awareness: Secondary | ICD-10-CM | POA: Insufficient documentation

## 2020-09-23 ENCOUNTER — Encounter: Payer: Self-pay | Admitting: Adult Health

## 2020-09-23 ENCOUNTER — Non-Acute Institutional Stay (INDEPENDENT_AMBULATORY_CARE_PROVIDER_SITE_OTHER): Payer: Self-pay | Admitting: Adult Health

## 2020-09-23 DIAGNOSIS — F418 Other specified anxiety disorders: Secondary | ICD-10-CM

## 2020-09-23 DIAGNOSIS — I5031 Acute diastolic (congestive) heart failure: Secondary | ICD-10-CM

## 2020-09-23 DIAGNOSIS — E876 Hypokalemia: Secondary | ICD-10-CM

## 2020-09-23 DIAGNOSIS — E440 Moderate protein-calorie malnutrition: Secondary | ICD-10-CM

## 2020-09-23 DIAGNOSIS — Z8639 Personal history of other endocrine, nutritional and metabolic disease: Secondary | ICD-10-CM

## 2020-09-23 NOTE — Progress Notes (Signed)
Location:  [facility] Room Number: 119-A Place of Service:  SNF (31) Provider:  Durenda Age, DNP, FNP-BC  Patient Care Team: Randa Evens as PCP - General (Physician Assistant) Sueanne Margarita, MD as PCP - Cardiology (Cardiology)  Extended Emergency Contact Information Primary Emergency Contact: Groome,Byrd Address: [address]          [place], [place] [address] Johnnette Litter of [employer] Phone: [phone] Relation: Brother  Code Status:  FULL CODE  Goals of care: Advanced Directive information Advanced Directives 09/15/2020  Does Patient Have a Medical Advance Directive? Yes  Type of Advance Directive Out of facility DNR (pink MOST or yellow form)  Does patient want to make changes to medical advance directive? No - Patient declined  Would patient like information on creating a medical advance directive? -     Chief Complaint  Patient presents with   Medical Management of Chronic Issues    Short-term rehabilitation visit    HPI:  Pt is a 69 y.o. female seen today for medical chronic diseases.  She is a short-term rehabilitation resident of [facility].  She has a PMH of post, chronic GERD, hiatal hernia/Schatzki's ring, thiazide-induced hyponatremia, essential hypertension and anxiety.  She was seen today in her room.  She complained of nasal dryness.  No reported SOB.  She was admitted to [facility] on 09/14/20 pos [facility] hospitalization 08/12/20 to 09/14/20 for multifactorial acute hypoxic and hypercarbic respiratory failure, aspiration versus community-acquired pneumonia, bilateral pleural effusions and left foot cellulitis.  She was admitted for a short-term rehabilitation.  Past Medical History:  Diagnosis Date   Abdominal pain, epigastric    Acid reflux    Pt was born with acute acid   AKI (acute kidney injury) ([place]) 09/15/2020   Anxiety    GERD (gastroesophageal reflux  disease)    Heart murmur    Hiatal hernia    Hypertension    Hyponatremia 02/10/2020   Schatzki's ring    Ulcer    8 years ago   Past Surgical History:  Procedure Laterality Date   ABDOMINAL HYSTERECTOMY     complete for fibroids   KNEE ARTHROSCOPY     right knee   shoulder repalcement     right   TONSILLECTOMY AND ADENOIDECTOMY      Allergies  Allergen Reactions   Iodine Shortness Of Breath and Rash   Thiazide-Type Diuretics Other (See Comments)    Severe hyponatremia July 2021   Shellfish Allergy     Causes rash    Outpatient Encounter Medications as of 09/23/2020  Medication Sig   acetaminophen (TYLENOL) 325 MG tablet Take 2 tablets (650 mg total) by mouth every 6 (six) hours as needed for mild pain, moderate pain, fever or headache.   alum & mag hydroxide-simeth (MAALOX/MYLANTA) 200-200-20 MG/5ML suspension Take 30 mLs by mouth every 4 (four) hours as needed for indigestion or heartburn.   docusate (COLACE) 50 MG/5ML liquid Take 10 mLs (100 mg total) by mouth 2 (two) times daily as needed for mild constipation.   famotidine (PEPCID) 20 MG tablet Take 1 tablet (20 mg total) by mouth daily.   furosemide (LASIX) 20 MG tablet Take 1 tablet (20 mg total) by mouth daily.   gabapentin (NEURONTIN) 100 MG capsule Take 1 capsule (100 mg total) by mouth 2 (two) times daily.   hydrOXYzine (ATARAX/VISTARIL) 25 MG tablet Take 25 mg by mouth every 8 (eight) hours as needed for anxiety.  metoprolol tartrate (LOPRESSOR) 25 MG tablet Take 1 tablet (25 mg total) by mouth 2 (two) times daily.   nicotine (NICODERM CQ - DOSED IN MG/24 HOURS) 21 mg/24hr patch Place 1 patch (21 mg total) onto the skin daily.   pantoprazole (PROTONIX) 40 MG tablet Take 1 tablet (40 mg total) by mouth 2 (two) times daily.   polyethylene glycol (MIRALAX / GLYCOLAX) 17 g packet Take 17 g by mouth daily as needed for moderate constipation.   potassium chloride 20 MEQ/15ML (10%) SOLN Take 30  mLs (40 mEq total) by mouth 2 (two) times daily.   protein supplement (RESOURCE BENEPROTEIN) 6 g POWD Take 1 Scoop (6 g total) by mouth 3 (three) times daily with meals.   traMADol (ULTRAM) 50 MG tablet Take 1 tablet (50 mg total) by mouth every 12 (twelve) hours as needed for severe pain.   No facility-administered encounter medications on file as of 09/23/2020.    Review of Systems  GENERAL: No change in appetite, no fatigue, no weight changes, no fever, chills or weakness MOUTH and THROAT: Denies oral discomfort, gingival pain or bleeding RESPIRATORY: no cough, SOB, DOE, wheezing, hemoptysis CARDIAC: No chest pain, edema or palpitations GI: No abdominal pain, diarrhea, constipation, heart burn, nausea or vomiting GU: Denies dysuria, frequency, hematuria, incontinence, or discharge NEUROLOGICAL: Denies dizziness, syncope, numbness, or headache PSYCHIATRIC: Denies feelings of depression or anxiety. No report of hallucinations, insomnia, paranoia, or agitation   Immunization History  Administered Date(s) Administered   Influenza Split 07/01/2015, 09/16/2017   PFIZER SARS-COV-2 Vaccination 01/14/2020, 02/13/2020   Pneumococcal Polysaccharide-23 07/01/2015   Tdap 05/03/2015, 05/18/2020   Zoster 07/01/2015   Pertinent  Health Maintenance Due  Topic Date Due   MAMMOGRAM  12/19/2014   DEXA SCAN  Never done   PNA vac Low Risk Adult (1 of 2 - PCV13) 08/12/2018   COLONOSCOPY  03/21/2020   INFLUENZA VACCINE  06/12/2020    Vitals:   09/23/20 1146  BP: 134/74  Pulse: 74  Resp: 18  Temp: 98.6 F (37 C)  TempSrc: Oral  SpO2: 97%  Weight: 86 lb 12.8 oz (39.4 kg)  Height: 5\' 2"  (1.575 m)   Body mass index is 15.88 kg/m.  Physical Exam  GENERAL APPEARANCE:  In no acute distress.  SKIN:  Skin is warm and dry.  MOUTH and THROAT: Lips are without lesions. Oral mucosa is moist and without lesions. Tongue is normal in shape, size, and color and without  lesions RESPIRATORY: Breathing is even & unlabored, BS CTAB CARDIAC: RRR, no murmur,no extra heart sounds, no edema GI: Abdomen soft, normal BS, no masses, no tenderness EXTREMITIES:  Able to move X 4 extremities NEUROLOGICAL: There is no tremor. Speech is clear. Alert and oriented X 3. PSYCHIATRIC:  Affect and behavior are appropriate  Labs reviewed: Recent Labs    09/06/20 0500 09/06/20 0500 09/07/20 0230 09/07/20 0230 09/08/20 0436 09/08/20 1902 09/12/20 0813 09/13/20 0430 09/14/20 0415  NA 141   < > 140   < > 136   < > 127* 130* 130*  K 4.3   < > 4.5   < > 4.1   < > 3.5 4.2 3.7  CL 106   < > 104   < > 100   < > 83* 86* 86*  CO2 30   < > 31   < > 31   < > 34* 37* 38*  GLUCOSE 129*   < > 127*   < > 114*   < >  103* 92 133*  BUN 24*   < > 28*   < > 28*   < > 17 14 13   CREATININE 0.46   < > 0.52   < > 0.45   < > 0.64 0.68 0.66  CALCIUM 8.1*   < > 8.2*   < > 7.7*   < > 7.8* 8.0* 7.9*  MG 2.0   < > 2.1  --  2.0  --  2.0  --   --   PHOS 3.2  --  3.3  --  3.4  --   --   --   --    < > = values in this interval not displayed.   Recent Labs    09/07/20 0230 09/08/20 0436 09/09/20 1430  AST 48* 37 37  ALT 48* 38 36  ALKPHOS 213* 155* 138*  BILITOT 0.3 0.4 0.4  PROT 4.9* 4.2* 4.5*  ALBUMIN 2.2* 1.9* 1.9*   Recent Labs    09/11/20 0812 09/12/20 0813 09/14/20 0415  WBC 15.7* 14.5* 7.4  NEUTROABS 12.2* 10.8* 4.6  HGB 8.3* 8.7* 7.6*  HCT 24.3* 25.8* 22.8*  MCV 88.0 88.7 89.8  PLT 486* 565* 543*   Lab Results  Component Value Date   TSH 1.536 09/07/2020   Lab Results  Component Value Date   HGBA1C 5.2 08/28/2020   Lab Results  Component Value Date   CHOL 276 (H) 09/11/2013   HDL 61.40 09/11/2013   LDLCALC 59 05/26/2011   LDLDIRECT 207.0 09/11/2013   TRIG 115 09/05/2020   CHOLHDL 4 09/11/2013    Significant Diagnostic Results in last 30 days:  EEG  Result Date: 08/29/2020 Lora Havens, MD     08/29/2020  5:42 PM Patient Name: Akyah [facility] MRN:  [id number] Epilepsy Attending: Lora Havens Referring Physician/Provider: Dr Harvie Bridge Date: 08/30/2020 Duration: 24.34 mins Patient history: 69yo F with ams, upward gaze preference. EEG to evaluate for seizure. Level of alertness: lethargic AEDs during EEG study: None Technical aspects: This EEG study was done with scalp electrodes positioned according to the 10-20 International system of electrode placement. Electrical activity was acquired at a sampling rate of 500Hz  and reviewed with a high frequency filter of 70Hz  and a low frequency filter of 1Hz . EEG data were recorded continuously and digitally stored. Description: EEG showed continuous generalized 3 to 6 Hz theta-delta slowing. Brief 2-3 seconds of generalized eeg attenuation were also noted. Hyperventilation and photic stimulation were not performed.   ABNORMALITY - Continuous slow, generalized - Background attenuation, generalized IMPRESSION: This study is suggestive of severe diffuse encephalopathy, nonspecific etiology.  No seizures or epileptiform discharges were seen throughout the recording. Lora Havens   DG Chest 1 View  Result Date: 09/07/2020 CLINICAL DATA:  69 year old female status post thoracentesis. EXAM: CHEST  1 VIEW COMPARISON:  Chest radiograph dated 09/06/2020. FINDINGS: Small bilateral pleural effusions relatively similar to the prior radiograph. There are bibasilar atelectasis or infiltrate. No pneumothorax. Stable cardiomegaly. Left-sided PICC with tip at the cavoatrial junction. No acute osseous pathology. Right shoulder arthroplasty. IMPRESSION: Small bilateral pleural effusions with bibasilar atelectasis or infiltrate. No pneumothorax. Electronically Signed   By: Anner Crete M.D.   On: 09/07/2020 16:16   DG Chest 1 View  Result Date: 09/06/2020 CLINICAL DATA:  Decreased oxygen saturation and patient responsiveness. EXAM: CHEST  1 VIEW COMPARISON:  09/04/2020 FINDINGS: Stable right arm PICC line. There  is considerable rotation artifact. Moderate to large bilateral pleural effusions are unchanged.  Mild interstitial edema identified. IMPRESSION: 1. Bilateral pleural effusions and mild interstitial edema, unchanged. Electronically Signed   By: Kerby Moors M.D.   On: 09/06/2020 08:32   DG Chest 2 View  Result Date: 09/11/2020 CLINICAL DATA:  Weakness.  CHF. EXAM: CHEST - 2 VIEW COMPARISON:  09/07/2020 FINDINGS: Moderate bilateral pleural effusions obscure the hemidiaphragms and portions of the heart border, unchanged from the prior study. There is additional AC opacity at the lung bases that is likely atelectasis. No evidence of pulmonary edema. No pneumothorax. Cardiac silhouette is partly obscured, grossly normal in size. No mediastinal or hilar masses. Left sided PICC is stable, tip in the lower superior vena cava. IMPRESSION: 1. Findings similar to the prior exam. Moderate bilateral pleural effusions with basilar atelectasis. No convincing pneumonia and no pulmonary edema. Electronically Signed   By: Lajean Manes M.D.   On: 09/11/2020 11:51   DG Abd 1 View  Result Date: 08/30/2020 CLINICAL DATA:  NG tube placement EXAM: ABDOMEN - 1 VIEW COMPARISON:  08/30/2020 chest CT 08/29/2020 FINDINGS: Endotracheal tube tip just above the carina. Tortuous course of the esophageal tube with the tip positioned over the lower chest presumably within known hiatal hernia. IMPRESSION: Tortuous course of the esophageal tube with the tip projecting over the lower chest, presumably within known hiatal hernia. Endotracheal tube tip just above the carina. Electronically Signed   By: Donavan Foil M.D.   On: 08/30/2020 18:18   DG Abd 1 View  Result Date: 08/30/2020 CLINICAL DATA:  Nasogastric tube placement EXAM: ABDOMEN - 1 VIEW COMPARISON:  08/29/2020 FINDINGS: Previously placed nasogastric tube has been removed. A new nasoenteric feeding tube is seen with its tip within the intrathoracic stomach within the hiatal  hernia. Large hiatal hernia again noted. Right basilar atelectasis or infiltrate is present. Endotracheal tube is seen 1.5 cm above the carina. Nonspecific abdominal gas pattern. IMPRESSION: Nasoenteric feeding tube tip within the intrathoracic stomach. Electronically Signed   By: Fidela Salisbury MD   On: 08/30/2020 16:30   CT HEAD WO CONTRAST  Result Date: 08/29/2020 CLINICAL DATA:  Altered mental status EXAM: CT HEAD WITHOUT CONTRAST TECHNIQUE: Contiguous axial images were obtained from the base of the skull through the vertex without intravenous contrast. COMPARISON:  05/19/2020 FINDINGS: Brain: No evidence of acute infarction, hemorrhage, hydrocephalus, extra-axial collection or mass lesion/mass effect. Patchy white matter low-density, chronic and likely from small vessel disease. Vascular: Atheromatous calcification Skull: Normal. Negative for fracture or focal lesion. Sinuses/Orbits: Fluid level in the right maxillary sinus and opacified right middle ear. IMPRESSION: 1. No acute intracranial finding or change from prior. 2. Chronic white matter disease. 3. Right maxillary sinus and middle ear opacification. Electronically Signed   By: Monte Fantasia M.D.   On: 08/29/2020 05:31   CT CHEST WO CONTRAST  Result Date: 08/29/2020 CLINICAL DATA:  Chronic dyspnea.  Altered mental status. EXAM: CT CHEST WITHOUT CONTRAST TECHNIQUE: Multidetector CT imaging of the chest was performed following the standard protocol without IV contrast. COMPARISON:  02/10/2020 FINDINGS: Cardiovascular: Normal heart size. No pericardial effusion. Aortic and great vessel atherosclerotic calcification. Mediastinum/Nodes: Large hiatal hernia, sliding type. Lungs/Pleura: Ground-glass and reticular opacities in the right more than left lung, preferentially dependent. Extensive opacity and volume loss in the lower lobes, worse on the left. The apical lungs appear hyperinflated. No effusion or pneumothorax Upper Abdomen: Prominent liver  steatosis. Musculoskeletal: Remote compression fractures of T4, T6, T7, T8, T9, and T11. No acute fracture. Glenohumeral arthroplasty on the  right. IMPRESSION: 1. Extensive lower lobe atelectasis with complete lobar collapse on the left. In the dependent right lung is patchy airspace opacity, question underlying aspiration. 2. Large hiatal hernia. 3. Multiple remote compression fractures. 4. Hepatic steatosis. Electronically Signed   By: Monte Fantasia M.D.   On: 08/29/2020 05:35   DG Fluoro Rm 1-60 Min  Result Date: 09/01/2020 CLINICAL DATA:  Feeding tube placement attempt EXAM: INTRAOPERATIVE CHEST RADIOGRAPH: 1 V COMPARISON:  Chest radiograph September 01, 2020 FINDINGS: Single apparent fluoroscopic image shows feeding tube in region of distal esophagus, unchanged from radiographic examination earlier in the day. Endotracheal tube present with tip 2.1 cm above the carina. IMPRESSION: Feeding tube tip in distal esophagus, stable from earlier in the day. Endotracheal tube as described. Electronically Signed   By: Lowella Grip III M.D.   On: 09/01/2020 16:08   DG Chest Port 1 View  Result Date: 09/04/2020 CLINICAL DATA:  Shortness of breath EXAM: PORTABLE CHEST 1 VIEW COMPARISON:  Yesterday FINDINGS: Extubation with stable inflation. Layering pleural effusions. Prominent rightward mediastinal convexity presumably from rotation. No aortic aneurysm or hilar mass on recent chest CT. Right PICC with tip at the SVC. IMPRESSION: Continued hazy opacity at the bases where there was atelectasis and airspace disease on recent chest CT. There is also bilateral pleural effusion. Electronically Signed   By: Monte Fantasia M.D.   On: 09/04/2020 11:00   DG CHEST PORT 1 VIEW  Result Date: 09/03/2020 CLINICAL DATA:  Acute respiratory failure with hypoxia. EXAM: PORTABLE CHEST 1 VIEW COMPARISON:  Chest x-rays dated 09/01/2020 and 08/30/2020. FINDINGS: Endotracheal tube is stable in position with tip above the  level of the carina. Heart size and mediastinal contours appear stable. RIGHT-sided PICC line is now in place with tip at the expected level of the cavoatrial junction. Veiled opacities are seen at each lung base, likely atelectasis and/or small pleural effusions. No pneumothorax is seen. IMPRESSION: 1. Endotracheal tube is stable in position with tip above the level of the carina. 2. New RIGHT-sided PICC line appears appropriately positioned. 3. Veiled opacities at each lung base, likely bilateral atelectasis and/or small pleural effusions. Electronically Signed   By: Franki Cabot M.D.   On: 09/03/2020 07:05   DG CHEST PORT 1 VIEW  Result Date: 09/01/2020 CLINICAL DATA:  Respiratory failure. EXAM: PORTABLE CHEST 1 VIEW COMPARISON:  08/30/2020 CT chest 08/29/2020. FINDINGS: Endotracheal tube tip 9 mm above the lower portion of the carina. Retraction of approximately 1-2 cm should be considered. Feeding tube noted with its tip over the lower esophagus/hiatal hernia. Heart size stable. Persistent prominent atelectasis and infiltrate right lung base. Persistent infiltrate left lung base. Small bilateral pleural effusions noted. No pneumothorax. IMPRESSION: 1. Endotracheal tube tip 9 mm above the lower portion of the carina. Retraction of approximately 1-2 cm should be considered. Feeding tube noted with its tip over the lower esophagus/hiatal hernia. Advancement may be desired. 2. Persistent prominent atelectasis and infiltrate right lung base. Persistent infiltrate left lung base. Small bilateral pleural effusions noted. Electronically Signed   By: Marcello Moores  Register   On: 09/01/2020 05:46   DG Chest Port 1 View  Result Date: 08/30/2020 CLINICAL DATA:  Respiratory failure EXAM: PORTABLE CHEST 1 VIEW COMPARISON:  Two days ago FINDINGS: The enteric tube is looped in a hiatal hernia. The endotracheal tube tip is just below the clavicular heads. Right more than left pulmonary opacity with volume loss as seen on  chest CT from yesterday. No visible effusion  or pneumothorax. IMPRESSION: 1. Unremarkable hardware positioning when accounting for hiatal hernia. 2. Stable degree of right more than left airspace disease. Electronically Signed   By: Monte Fantasia M.D.   On: 08/30/2020 05:51   DG Chest Port 1 View  Result Date: 08/28/2020 CLINICAL DATA:  68 year old female with concern for sepsis. EXAM: PORTABLE CHEST 1 VIEW COMPARISON:  Chest radiograph dated 02/10/2020 and CT dated 02/10/2020 FINDINGS: Endotracheal tube with tip above the carina. An enteric tube is noted with side-port over the mediastinum and tip at the level of the diaphragm to the right of the midline. Although the tip of the enteric tube may be in the distal esophagus the right main stem bronchus intubation is not excluded. Recommend retraction and repositioning of the NG. There is right mid to lower lung field density with overall decreased right lung volume, likely atelectasis. Pneumonia or aspiration is not excluded. The left lung is clear. No pleural effusion pneumothorax. The cardiac silhouette is within limits. Atherosclerotic calcification of the aorta. No acute osseous pathology. Degenerative changes of the spine. Right shoulder arthroplasty. IMPRESSION: 1. Endotracheal tube with tip above the carina. 2. Possible positioning of the NG in the right mainstem bronchus. Recommend retraction and repositioning of the NG. 3. Right mid to lower lung field density, likely atelectasis. Aspiration is not excluded. These results were called by telephone at the time of interpretation on 08/28/2020 at 8:06 pm to nurse Rountree who verbally acknowledged these results. Electronically Signed   By: Anner Crete M.D.   On: 08/28/2020 20:10   DG Abd Portable 1V  Result Date: 08/29/2020 CLINICAL DATA:  Feeding tube placement EXAM: PORTABLE ABDOMEN - 1 VIEW COMPARISON:  08/29/2020 FINDINGS: Similar positioning of enteric tube which is looped with distal tip  terminating within the expected location of the proximal stomach within patient's known hiatal hernia. Visualized bowel gas pattern is nonobstructive. Right basilar atelectasis. Trace bilateral pleural effusions. IMPRESSION: Similar positioning of enteric tube which is looped with distal tip terminating within the proximal stomach within patient's known hiatal hernia. Electronically Signed   By: Davina Poke D.O.   On: 08/29/2020 14:26   DG Abd Portable 1V  Result Date: 08/29/2020 CLINICAL DATA:  Nasogastric tube placement EXAM: PORTABLE ABDOMEN - 1 VIEW COMPARISON:  08/28/2020 FINDINGS: Nasogastric tube appears looped within the stomach with its tip within the gastroesophageal junction and its proximal side hole within the proximal body of the stomach. Visualized abdominal gas pattern is unremarkable. Lower pelvis is excluded from view. Right basilar collapse has significantly improved. Small hiatal hernia is suspected. IMPRESSION: Nasogastric tube looped within the gastric lumen as above. Electronically Signed   By: Fidela Salisbury MD   On: 08/29/2020 13:38   DG Abd Portable 1 View  Result Date: 08/28/2020 CLINICAL DATA:  Check gastric catheter placement EXAM: PORTABLE ABDOMEN - 1 VIEW COMPARISON:  Chest film from earlier in the same day. FINDINGS: Gastric catheter has been advanced and is now coiled upon itself in the distal esophagus with the tip in the mid to distal esophagus and a loop of catheter within the stomach. This should be withdrawn and readvanced. Persistent opacity in the left base is noted. IMPRESSION: Gastric catheter looped upon itself with the tip in the distal esophagus. This should be withdrawn and readvanced. Electronically Signed   By: Inez Catalina M.D.   On: 08/28/2020 21:31   DG Swallowing Func-Speech Pathology  Result Date: 09/05/2020 Objective Swallowing Evaluation: Type of Study: MBS-Modified Barium Swallow Study  Patient Details Name: Ariele Vidrio MRN: [id number]  Date of Birth: 04-29-1951 Today's Date: 09/05/2020 Time: SLP Start Time (ACUTE ONLY): 1205 -SLP Stop Time (ACUTE ONLY): 1230 SLP Time Calculation (min) (ACUTE ONLY): 25 min Past Medical History: Past Medical History: Diagnosis Date  Abdominal pain, epigastric   Acid reflux   Pt was born with acute acid  Anxiety   GERD (gastroesophageal reflux disease)   Heart murmur   Hiatal hernia   Hypertension   Hyponatremia 02/10/2020  Schatzki's ring   Ulcer   8 years ago Past Surgical History: Past Surgical History: Procedure Laterality Date  ABDOMINAL HYSTERECTOMY    complete for fibroids  KNEE ARTHROSCOPY    right knee  shoulder repalcement    right  TONSILLECTOMY AND ADENOIDECTOMY   HPI: 69 yo F with a history of hiatal hernia, schatski ring, hyponatremia who was found down at home by her brother after she was last known normal yesterday.  When EMS arrived, her o2 sats were in the 49s and had difficulty breathing.  She was thus placed on NRB, and when assessed in the ED, decision was made to intubate.   CXR showed right sided atelectasis and likely infiltrate 09/04/20 CXR indicated: Continued hazy opacity at the bases where there was atelectasis and .airspace disease on recent chest CT. There is also bilateral pleural  Subjective: Pt seen in radiology for MBS. States she is "very weak" today Assessment / Plan / Recommendation CHL IP CLINICAL IMPRESSIONS 09/05/2020 Clinical Impression Pt exhibits normal oral and pharyngeal swallow function. No oral residue or anterior leakage, and no penetration/aspiration or post-swallow residue seen. Esophageal sweep revealed stasis throughout the upper-mid esophagus several minutes after PO trials had ended. Recommend dys 3 diet with solids cut into small pieces (for energy conservation), thin liquids, meds one at a time. Pt was given written behavioral and dietary strategies for management of esophageal dysmotility. SLP will follow to assess diet tolerance and continue  education.  SLP Visit Diagnosis Dysphagia, pharyngoesophageal phase (R13.14)     Impact on safety and function Mild - Moderate aspiration risk Risk for inadequate nutrition/hydration     Prognosis 09/05/2020 Prognosis for Safe Diet Advancement Fair Barriers to Reach Goals Severity of deficits Barriers/Prognosis Comment Significant esophageal dysmotility observed CHL IP DIET RECOMMENDATION 09/05/2020 SLP Diet Recommendations Dysphagia 3 (Mech soft) solids;Thin liquid Liquid Administration via Cup;Straw Medication Administration Whole meds with liquid Compensations Minimize environmental distractions Slow rate Small sips/bites Postural Changes Seated upright at 90 degrees Remain semi-upright after after meals    CHL IP OTHER RECOMMENDATIONS 09/05/2020   Oral Care Recommendations Oral care BID            CHL IP ORAL PHASE 09/05/2020 Oral Phase WFL    CHL IP PHARYNGEAL PHASE 09/05/2020 Pharyngeal Phase WFL    CHL IP CERVICAL ESOPHAGEAL PHASE 09/05/2020 Cervical Esophageal Phase Impaired   Celia B. Quentin Ore, [facility], [place] Speech Language Pathologist Office: [phone] Pager: [phone] Shonna Chock 09/05/2020, 12:52 PM              ECHOCARDIOGRAM COMPLETE  Result Date: 09/08/2020    ECHOCARDIOGRAM REPORT   Patient Name:   RAYANA GEURIN [facility] Date of Exam: 09/08/2020 Medical Rec #:  [id number]        Height:       62.0 in Accession #:    [id number]       Weight:       106.3 lb Date of Birth:  10-11-1951  BSA:          1.461 m Patient Age:    70 years         BP:           115/69 mmHg Patient Gender: F                HR:           57 bpm. Exam Location:  Inpatient Procedure: 2D Echo Indications:    CHF 428  History:        Patient has no prior history of Echocardiogram examinations.                 Risk Factors:Current Smoker and Hypertension.  Sonographer:    Jannett Celestine RDCS (AE) Referring Phys: [id number] Kayleen Memos  Sonographer Comments: Suboptimal parasternal window. very limited mobility. unable to  turn on left side. IMPRESSIONS  1. Left ventricular ejection fraction, by estimation, is 55%. The left ventricle has normal function. The left ventricle demonstrates regional wall motion abnormalities with basal inferior and basal to mid inferolateral akinesis. Left ventricular diastolic parameters are consistent with Grade I diastolic dysfunction (impaired relaxation).  2. Right ventricular systolic function is normal. The right ventricular size is normal. Tricuspid regurgitation signal is inadequate for assessing PA pressure.  3. Left atrial size was moderately dilated.  4. The mitral valve is normal in structure. Trivial mitral valve regurgitation. No evidence of mitral stenosis. Moderate mitral annular calcification.  5. The aortic valve was not well visualized. Aortic valve regurgitation is not visualized. No aortic stenosis is present.  6. The inferior vena cava is normal in size with greater than 50% respiratory variability, suggesting right atrial pressure of 3 mmHg.  7. Technically difficult study with poor acoustic windows. FINDINGS  Left Ventricle: Left ventricular ejection fraction, by estimation, is 55%. The left ventricle has normal function. The left ventricle demonstrates regional wall motion abnormalities. The left ventricular internal cavity size was normal in size. There is  no left ventricular hypertrophy. Left ventricular diastolic parameters are consistent with Grade I diastolic dysfunction (impaired relaxation). Right Ventricle: The right ventricular size is normal. No increase in right ventricular wall thickness. Right ventricular systolic function is normal. Tricuspid regurgitation signal is inadequate for assessing PA pressure. Left Atrium: Left atrial size was moderately dilated. Right Atrium: Right atrial size was normal in size. Pericardium: There is no evidence of pericardial effusion. Mitral Valve: The mitral valve is normal in structure. Moderate mitral annular calcification. Trivial  mitral valve regurgitation. No evidence of mitral valve stenosis. Tricuspid Valve: The tricuspid valve is normal in structure. Tricuspid valve regurgitation is not demonstrated. Aortic Valve: The aortic valve was not well visualized. Aortic valve regurgitation is not visualized. No aortic stenosis is present. Pulmonic Valve: The pulmonic valve was not well visualized. Pulmonic valve regurgitation is not visualized. Aorta: The aortic root was not well visualized. Venous: The inferior vena cava is normal in size with greater than 50% respiratory variability, suggesting right atrial pressure of 3 mmHg. IAS/Shunts: No atrial level shunt detected by color flow Doppler.  LEFT VENTRICLE PLAX 2D LVOT diam:     1.70 cm  Diastology LV SV:         51       LV e' medial:    5.33 cm/s LV SV Index:   35       LV E/e' medial:  13.9 LVOT Area:     2.27 cm LV e' lateral:  5.77 cm/s                         LV E/e' lateral: 12.8  RIGHT VENTRICLE RV S prime:     16.10 cm/s TAPSE (M-mode): 1.7 cm LEFT ATRIUM             Index LA Vol (A2C):   50.0 ml 34.22 ml/m LA Vol (A4C):   82.9 ml 56.74 ml/m LA Biplane Vol: 63.8 ml 43.66 ml/m  AORTIC VALVE LVOT Vmax:   143.00 cm/s LVOT Vmean:  82.700 cm/s LVOT VTI:    0.226 m MITRAL VALVE MV Area (PHT): 3.68 cm    SHUNTS MV Decel Time: 206 msec    Systemic VTI:  0.23 m MV E velocity: 74.10 cm/s  Systemic Diam: 1.70 cm Loralie Champagne MD Electronically signed by Loralie Champagne MD Signature Date/Time: 09/08/2020/5:25:22 PM    Final    ECHOCARDIOGRAM LIMITED  Result Date: 09/11/2020    ECHOCARDIOGRAM LIMITED REPORT   Patient Name:   EMMAH BRATCHER [facility] Date of Exam: 09/11/2020 Medical Rec #:  [id number]        Height:       62.0 in Accession #:    [id number]       Weight:       101.0 lb Date of Birth:  07-16-1951        BSA:          1.430 m Patient Age:    84 years         BP:           121/71 mmHg Patient Gender: F                HR:           85 bpm. Exam Location:  Inpatient Procedure: Limited  Echo and Intracardiac Opacification Agent Indications:    CHF- Acute Diastolic O27.74; Needs Definity to rule out focal                 wall motion abnormalities.  History:        Patient has prior history of Echocardiogram examinations, most                 recent 09/08/2020. Signs/Symptoms:Murmur; Risk                 Factors:Hypertension.  Sonographer:    Mikki Santee RDCS (AE) Referring Phys: [id number] TRACI R TURNER IMPRESSIONS  1. Left ventricular ejection fraction, by estimation, is 60 to 65%. The left ventricle has normal function. There is akinesis of the left ventricular, basal inferior wall. The inferolateral wall appears normal. FINDINGS  Left Ventricle: Left ventricular ejection fraction, by estimation, is 60 to 65%. The left ventricle has normal function. Definity contrast agent was given IV to delineate the left ventricular endocardial borders. Fransico Him MD Electronically signed by Fransico Him MD Signature Date/Time: 09/11/2020/2:23:01 PM    Final    [place] EKG SITE RITE  Result Date: 09/06/2020 If Site Rite image not attached, placement could not be confirmed due to current cardiac rhythm.  [place] EKG SITE RITE  Result Date: 09/02/2020 If Site Rite image not attached, placement could not be confirmed due to current cardiac rhythm.  US THORACENTESIS ASP PLEURAL SPACE W/IMG GUIDE  Result Date: 09/07/2020 INDICATION: Patient recently admitted for acute respiratory failure with hypoxia due to suspected aspiration pneumonia versus community acquired pneumonia. New onset bilateral pleural effusions. Interventional radiology asked  to perform a therapeutic and diagnostic thoracentesis. EXAM: ULTRASOUND GUIDED THORACENTESIS MEDICATIONS: 1% lidocaine 10 mL COMPLICATIONS: None immediate. PROCEDURE: An ultrasound guided thoracentesis was thoroughly discussed with the patient and questions answered. The benefits, risks, alternatives and complications were also discussed. The patient understands and  wishes to proceed with the procedure. Written consent was obtained. Ultrasound was performed to localize and mark an adequate pocket of fluid in the right chest. The area was then prepped and draped in the normal sterile fashion. 1% Lidocaine was used for local anesthesia. Under ultrasound guidance a 6 Fr Safe-T-Centesis catheter was introduced. Thoracentesis was performed. The catheter was removed and a dressing applied. FINDINGS: A total of approximately 550 mL of clear yellow fluid was removed. Samples were sent to the laboratory as requested by the clinical team. IMPRESSION: Successful ultrasound guided right thoracentesis yielding 550 mL of pleural fluid. Read by: Soyla Dryer, NP Electronically Signed   By: Jacqulynn Cadet M.D.   On: 09/07/2020 16:42    Assessment/Plan  1. Acute diastolic congestive heart failure (HCC) -No SOB, continue furosemide and metoprolol tartrate  2. Malnutrition of severe degree Wt Readings from Last 3 Encounters:  09/23/20 86 lb 12.8 oz (39.4 kg)  09/15/20 93 lb 4.8 oz (42.3 kg)  09/14/20 93 lb 0.6 oz (42.2 kg)   Body mass index is 15.88 kg/m.  -Continue supplementation with Magic cup  3. History of SIADH BMP Latest Ref Rng & Units 09/14/2020 09/13/2020 09/12/2020  Glucose 70 - 99 mg/dL 133(H) 92 103(H)  BUN 8 - 23 mg/dL 13 14 17   Creatinine 0.44 - 1.00 mg/dL 0.66 0.68 0.64  Sodium 135 - 145 mmol/L 130(L) 130(L) 127(L)  Potassium 3.5 - 5.1 mmol/L 3.7 4.2 3.5  Chloride 98 - 111 mmol/L 86(L) 86(L) 83(L)  CO2 22 - 32 mmol/L 38(H) 37(H) 34(H)  Calcium 8.9 - 10.3 mg/dL 7.9(L) 8.0(L) 7.8(L)   -  Re-check BMP  4. Hypokalemia Lab Results  Component Value Date   K 3.7 09/14/2020   -  Will monitor  5. Situational anxiety -  Mood is stable, continue PRN hydroxyzine    Family/ staff Communication: Discussed plan of care with resident and charge nurse.  Labs/tests ordered: CBC and BMP  Goals of care:   Short-term care   Durenda Age,  DNP, MSN, FNP-BC [facility] [phone] (Monday-Friday 8:00 a.m. - 5:00 p.m.) [phone] (after hours)

## 2020-09-28 ENCOUNTER — Telehealth: Payer: Self-pay

## 2020-09-28 NOTE — Telephone Encounter (Signed)
NOTES ON FILE FROM [facility]IOLOGY [facility] [id number], SENT REFERRAL TO SCHEDULING

## 2020-09-29 LAB — CBC AND DIFFERENTIAL
HCT: 28 — AB (ref 36–46)
Hemoglobin: 9.3 — AB (ref 12.0–16.0)
Neutrophils Absolute: 6.1
Platelets: 684 — AB (ref 150–399)
WBC: 9.8

## 2020-09-29 LAB — BASIC METABOLIC PANEL
BUN: 10 (ref 4–21)
CO2: 29 — AB (ref 13–22)
Chloride: 93 — AB (ref 99–108)
Creatinine: 0.8 (ref 0.5–1.1)
Glucose: 94
Potassium: 5.1 (ref 3.4–5.3)
Sodium: 131 — AB (ref 137–147)

## 2020-09-29 LAB — COMPREHENSIVE METABOLIC PANEL
Calcium: 9.3 (ref 8.7–10.7)
GFR calc Af Amer: 90
GFR calc non Af Amer: 82.67

## 2020-09-29 LAB — CBC: RBC: 3.26 — AB (ref 3.87–5.11)

## 2020-10-03 ENCOUNTER — Non-Acute Institutional Stay (INDEPENDENT_AMBULATORY_CARE_PROVIDER_SITE_OTHER): Payer: Self-pay | Admitting: Adult Health

## 2020-10-03 ENCOUNTER — Encounter: Payer: Self-pay | Admitting: Adult Health

## 2020-10-03 DIAGNOSIS — Z8639 Personal history of other endocrine, nutritional and metabolic disease: Secondary | ICD-10-CM

## 2020-10-03 DIAGNOSIS — Z720 Tobacco use: Secondary | ICD-10-CM

## 2020-10-03 DIAGNOSIS — E876 Hypokalemia: Secondary | ICD-10-CM

## 2020-10-03 DIAGNOSIS — J189 Pneumonia, unspecified organism: Secondary | ICD-10-CM

## 2020-10-03 DIAGNOSIS — K219 Gastro-esophageal reflux disease without esophagitis: Secondary | ICD-10-CM

## 2020-10-03 DIAGNOSIS — I5031 Acute diastolic (congestive) heart failure: Secondary | ICD-10-CM

## 2020-10-03 DIAGNOSIS — F418 Other specified anxiety disorders: Secondary | ICD-10-CM

## 2020-10-03 DIAGNOSIS — J9601 Acute respiratory failure with hypoxia: Secondary | ICD-10-CM

## 2020-10-03 DIAGNOSIS — G629 Polyneuropathy, unspecified: Secondary | ICD-10-CM

## 2020-10-03 DIAGNOSIS — J9602 Acute respiratory failure with hypercapnia: Secondary | ICD-10-CM

## 2020-10-03 NOTE — Progress Notes (Deleted)
Location:      Place of Service:    Provider:  Durenda Age, DNP, FNP-BC  Patient Care Team: Randa Evens as PCP - General (Physician Assistant) Sueanne Margarita, MD as PCP - Cardiology (Cardiology)  Extended Emergency Contact Information Primary Emergency Contact: Theissen,Byrd Address: [address]          [place], [place] [address] Johnnette Litter of [employer] Phone: [phone] Relation: Brother  Code Status:  ***  Goals of care: Advanced Directive information Advanced Directives 09/15/2020  Does Patient Have a Medical Advance Directive? Yes  Type of Advance Directive Out of facility DNR (pink MOST or yellow form)  Does patient want to make changes to medical advance directive? No - Patient declined  Would patient like information on creating a medical advance directive? -     No chief complaint on file.   HPI:  Pt is a 68 y.o. female seen today for medical management of chronic diseases.     Past Medical History:  Diagnosis Date  . Abdominal pain, epigastric   . Acid reflux    Pt was born with acute acid  . AKI (acute kidney injury) ([place]) 09/15/2020  . Anxiety   . GERD (gastroesophageal reflux disease)   . Heart murmur   . Hiatal hernia   . Hypertension   . Hyponatremia 02/10/2020  . Schatzki's ring   . Ulcer    8 years ago   Past Surgical History:  Procedure Laterality Date  . ABDOMINAL HYSTERECTOMY     complete for fibroids  . KNEE ARTHROSCOPY     right knee  . shoulder repalcement     right  . TONSILLECTOMY AND ADENOIDECTOMY      Allergies  Allergen Reactions  . Iodine Shortness Of Breath and Rash  . Thiazide-Type Diuretics Other (See Comments)    Severe hyponatremia July 2021  . Shellfish Allergy     Causes rash    Outpatient Encounter Medications as of 10/04/2020  Medication Sig  . acetaminophen (TYLENOL) 325 MG tablet Take 2 tablets (650 mg total) by mouth every 6 (six) hours as needed for mild pain, moderate pain, fever  or headache.  Marland Kitchen alum & mag hydroxide-simeth (MAALOX/MYLANTA) 200-200-20 MG/5ML suspension Take 30 mLs by mouth every 4 (four) hours as needed for indigestion or heartburn.  . docusate (COLACE) 50 MG/5ML liquid Take 10 mLs (100 mg total) by mouth 2 (two) times daily as needed for mild constipation.  . Ensure (ENSURE) Take 237 mLs by mouth 2 (two) times daily between meals.  . famotidine (PEPCID) 20 MG tablet Take 1 tablet (20 mg total) by mouth daily.  . furosemide (LASIX) 20 MG tablet Take 1 tablet (20 mg total) by mouth daily.  Marland Kitchen gabapentin (NEURONTIN) 100 MG capsule Take 1 capsule (100 mg total) by mouth 2 (two) times daily.  . metoprolol tartrate (LOPRESSOR) 25 MG tablet Take 1 tablet (25 mg total) by mouth 2 (two) times daily.  . nicotine (NICODERM CQ - DOSED IN MG/24 HOURS) 21 mg/24hr patch Place 1 patch (21 mg total) onto the skin daily.  . Nutritional Supplements (NUTRITIONAL SUPPLEMENT PO) Take 1 each by mouth daily. Magic Cup  . pantoprazole (PROTONIX) 40 MG tablet Take 1 tablet (40 mg total) by mouth 2 (two) times daily.  . polyethylene glycol (MIRALAX / GLYCOLAX) 17 g packet Take 17 g by mouth daily as needed for moderate constipation.  . potassium chloride 20 MEQ/15ML (10%) SOLN Take 30 mLs (40 mEq  total) by mouth 2 (two) times daily.  . protein supplement (RESOURCE BENEPROTEIN) 6 g POWD Take 1 Scoop (6 g total) by mouth 3 (three) times daily with meals.  . traMADol (ULTRAM) 50 MG tablet Take 1 tablet (50 mg total) by mouth every 12 (twelve) hours as needed for severe pain.   No facility-administered encounter medications on file as of 10/04/2020.    Review of Systems  GENERAL: No change in appetite, no fatigue, no weight changes, no fever, chills or weakness SKIN: Denies rash, itching, wounds, ulcer sores, or nail abnormalities EYES: Denies change in vision, dry eyes, eye pain, itching or discharge EARS: Denies change in hearing, ringing in ears, or earache NOSE: Denies nasal  congestion or epistaxis MOUTH and THROAT: Denies oral discomfort, gingival pain or bleeding, pain from teeth or hoarseness   RESPIRATORY: no cough, SOB, DOE, wheezing, hemoptysis CARDIAC: No chest pain, edema or palpitations GI: No abdominal pain, diarrhea, constipation, heart burn, nausea or vomiting GU: Denies dysuria, frequency, hematuria, incontinence, or discharge MUSCULOSKELETAL: Denies joint pain, muscle pain, back pain, restricted movement, or unusual weakness CIRCULATION: Denies claudication, edema of legs, varicosities, or cold extremities NEUROLOGICAL: Denies dizziness, syncope, numbness, or headache PSYCHIATRIC: Denies feelings of depression or anxiety. No report of hallucinations, insomnia, paranoia, or agitation ENDOCRINE: Denies polyphagia, polyuria, polydipsia, heat or cold intolerance HEME/LYMPH: Denies excessive bruising, petechia, enlarged lymph nodes, or bleeding problems IMMUNOLOGIC: Denies history of frequent infections, AIDS, or use of immunosuppressive agents   Immunization History  Administered Date(s) Administered  . Influenza Split 07/01/2015, 09/16/2017  . PFIZER SARS-COV-2 Vaccination 01/14/2020, 02/13/2020  . Pneumococcal Polysaccharide-23 07/01/2015  . Tdap 05/03/2015, 05/18/2020  . Zoster 07/01/2015   Pertinent  Health Maintenance Due  Topic Date Due  . MAMMOGRAM  12/19/2014  . DEXA SCAN  Never done  . PNA vac Low Risk Adult (1 of 2 - PCV13) 08/12/2018  . COLONOSCOPY  03/21/2020  . INFLUENZA VACCINE  06/12/2020   No flowsheet data found.   There were no vitals filed for this visit. There is no height or weight on file to calculate BMI.  Physical Exam  GENERAL APPEARANCE: Well nourished. In no acute distress. Normal body habitus SKIN:  Skin is warm and dry. There are no suspicious lesions or rash HEAD: Normal in size and contour. No evidence of trauma EYES: Lids open and close normally. No blepharitis, entropion or ectropion. PERRL.  Conjunctivae are clear and sclerae are white. Lenses are without opacity EARS: Pinnae are normal. Patient hears normal voice tunes of the examiner MOUTH and THROAT: Lips are without lesions. Oral mucosa is moist and without lesions. Tongue is normal in shape, size, and color and without lesions NECK: supple, trachea midline, no neck masses, no thyroid tenderness, no thyromegaly LYMPHATICS: No LAN in the neck, no supraclavicular LAN RESPIRATORY: Breathing is even & unlabored, BS CTAB CARDIAC: RRR, no murmur,no extra heart sounds, no edema GI: Abdomen soft, normal BS, no masses, no tenderness, no hepatomegaly, no splenomegaly MUSCULOSKELETAL: No deformities. Movement at each extremity is full and painless. Strength is 5/5 at each extremity. Back is without kyphosis or scoliosis CIRCULATION: Pedal pulses are 2+. There is no edema of the legs, ankles and feet NEUROLOGICAL: There is no tremor. Speech is clear PSYCHIATRIC: Alert and oriented X 3. Affect and behavior are appropriate  Labs reviewed: Recent Labs    09/06/20 0500 09/06/20 0500 09/07/20 0230 09/07/20 0230 09/08/20 0436 09/08/20 1902 09/12/20 0813 09/13/20 0430 09/14/20 0415  NA 141   < >  140   < > 136   < > 127* 130* 130*  K 4.3   < > 4.5   < > 4.1   < > 3.5 4.2 3.7  CL 106   < > 104   < > 100   < > 83* 86* 86*  CO2 30   < > 31   < > 31   < > 34* 37* 38*  GLUCOSE 129*   < > 127*   < > 114*   < > 103* 92 133*  BUN 24*   < > 28*   < > 28*   < > 17 14 13   CREATININE 0.46   < > 0.52   < > 0.45   < > 0.64 0.68 0.66  CALCIUM 8.1*   < > 8.2*   < > 7.7*   < > 7.8* 8.0* 7.9*  MG 2.0   < > 2.1  --  2.0  --  2.0  --   --   PHOS 3.2  --  3.3  --  3.4  --   --   --   --    < > = values in this interval not displayed.   Recent Labs    09/07/20 0230 09/08/20 0436 09/09/20 1430  AST 48* 37 37  ALT 48* 38 36  ALKPHOS 213* 155* 138*  BILITOT 0.3 0.4 0.4  PROT 4.9* 4.2* 4.5*  ALBUMIN 2.2* 1.9* 1.9*   Recent Labs    09/11/20 0812  09/12/20 0813 09/14/20 0415  WBC 15.7* 14.5* 7.4  NEUTROABS 12.2* 10.8* 4.6  HGB 8.3* 8.7* 7.6*  HCT 24.3* 25.8* 22.8*  MCV 88.0 88.7 89.8  PLT 486* 565* 543*   Lab Results  Component Value Date   TSH 1.536 09/07/2020   Lab Results  Component Value Date   HGBA1C 5.2 08/28/2020   Lab Results  Component Value Date   CHOL 276 (H) 09/11/2013   HDL 61.40 09/11/2013   LDLCALC 59 05/26/2011   LDLDIRECT 207.0 09/11/2013   TRIG 115 09/05/2020   CHOLHDL 4 09/11/2013    Significant Diagnostic Results in last 30 days:  DG Chest 1 View  Result Date: 09/07/2020 CLINICAL DATA:  69 year old female status post thoracentesis. EXAM: CHEST  1 VIEW COMPARISON:  Chest radiograph dated 09/06/2020. FINDINGS: Small bilateral pleural effusions relatively similar to the prior radiograph. There are bibasilar atelectasis or infiltrate. No pneumothorax. Stable cardiomegaly. Left-sided PICC with tip at the cavoatrial junction. No acute osseous pathology. Right shoulder arthroplasty. IMPRESSION: Small bilateral pleural effusions with bibasilar atelectasis or infiltrate. No pneumothorax. Electronically Signed   By: Anner Crete M.D.   On: 09/07/2020 16:16   DG Chest 1 View  Result Date: 09/06/2020 CLINICAL DATA:  Decreased oxygen saturation and patient responsiveness. EXAM: CHEST  1 VIEW COMPARISON:  09/04/2020 FINDINGS: Stable right arm PICC line. There is considerable rotation artifact. Moderate to large bilateral pleural effusions are unchanged. Mild interstitial edema identified. IMPRESSION: 1. Bilateral pleural effusions and mild interstitial edema, unchanged. Electronically Signed   By: Kerby Moors M.D.   On: 09/06/2020 08:32   DG Chest 2 View  Result Date: 09/11/2020 CLINICAL DATA:  Weakness.  CHF. EXAM: CHEST - 2 VIEW COMPARISON:  09/07/2020 FINDINGS: Moderate bilateral pleural effusions obscure the hemidiaphragms and portions of the heart border, unchanged from the prior study. There is  additional AC opacity at the lung bases that is likely atelectasis. No evidence of pulmonary edema. No pneumothorax. Cardiac  silhouette is partly obscured, grossly normal in size. No mediastinal or hilar masses. Left sided PICC is stable, tip in the lower superior vena cava. IMPRESSION: 1. Findings similar to the prior exam. Moderate bilateral pleural effusions with basilar atelectasis. No convincing pneumonia and no pulmonary edema. Electronically Signed   By: Lajean Manes M.D.   On: 09/11/2020 11:51   DG Chest Port 1 View  Result Date: 09/04/2020 CLINICAL DATA:  Shortness of breath EXAM: PORTABLE CHEST 1 VIEW COMPARISON:  Yesterday FINDINGS: Extubation with stable inflation. Layering pleural effusions. Prominent rightward mediastinal convexity presumably from rotation. No aortic aneurysm or hilar mass on recent chest CT. Right PICC with tip at the SVC. IMPRESSION: Continued hazy opacity at the bases where there was atelectasis and airspace disease on recent chest CT. There is also bilateral pleural effusion. Electronically Signed   By: Monte Fantasia M.D.   On: 09/04/2020 11:00   DG Swallowing Func-Speech Pathology  Result Date: 09/05/2020 Objective Swallowing Evaluation: Type of Study: MBS-Modified Barium Swallow Study  Patient Details Name: Eudora Guevarra MRN: [id number] Date of Birth: [birth year] Today's Date: 09/05/2020 Time: SLP Start Time (ACUTE ONLY): 1205 -SLP Stop Time (ACUTE ONLY): 1230 SLP Time Calculation (min) (ACUTE ONLY): 25 min Past Medical History: Past Medical History: Diagnosis Date . Abdominal pain, epigastric  . Acid reflux   Pt was born with acute acid . Anxiety  . GERD (gastroesophageal reflux disease)  . Heart murmur  . Hiatal hernia  . Hypertension  . Hyponatremia 02/10/2020 . Schatzki's ring  . Ulcer   8 years ago Past Surgical History: Past Surgical History: Procedure Laterality Date . ABDOMINAL HYSTERECTOMY    complete for fibroids . KNEE ARTHROSCOPY    right knee . shoulder  repalcement    right . TONSILLECTOMY AND ADENOIDECTOMY   HPI: 69 yo F with a history of hiatal hernia, schatski ring, hyponatremia who was found down at home by her brother after she was last known normal yesterday.  When EMS arrived, her o2 sats were in the 63s and had difficulty breathing.  She was thus placed on NRB, and when assessed in the ED, decision was made to intubate.   CXR showed right sided atelectasis and likely infiltrate 09/04/20 CXR indicated: Continued hazy opacity at the bases where there was atelectasis and .airspace disease on recent chest CT. There is also bilateral pleural  Subjective: Pt seen in radiology for MBS. States she is "very weak" today Assessment / Plan / Recommendation CHL IP CLINICAL IMPRESSIONS 09/05/2020 Clinical Impression Pt exhibits normal oral and pharyngeal swallow function. No oral residue or anterior leakage, and no penetration/aspiration or post-swallow residue seen. Esophageal sweep revealed stasis throughout the upper-mid esophagus several minutes after PO trials had ended. Recommend dys 3 diet with solids cut into small pieces (for energy conservation), thin liquids, meds one at a time. Pt was given written behavioral and dietary strategies for management of esophageal dysmotility. SLP will follow to assess diet tolerance and continue education.  SLP Visit Diagnosis Dysphagia, pharyngoesophageal phase (R13.14)     Impact on safety and function Mild - Moderate aspiration risk Risk for inadequate nutrition/hydration     Prognosis 09/05/2020 Prognosis for Safe Diet Advancement Fair Barriers to Reach Goals Severity of deficits Barriers/Prognosis Comment Significant esophageal dysmotility observed CHL IP DIET RECOMMENDATION 09/05/2020 SLP Diet Recommendations Dysphagia 3 (Mech soft) solids;Thin liquid Liquid Administration via Cup;Straw Medication Administration Whole meds with liquid Compensations Minimize environmental distractions Slow rate Small sips/bites Postural  Changes Seated  upright at 90 degrees Remain semi-upright after after meals    CHL IP OTHER RECOMMENDATIONS 09/05/2020   Oral Care Recommendations Oral care BID            CHL IP ORAL PHASE 09/05/2020 Oral Phase WFL    CHL IP PHARYNGEAL PHASE 09/05/2020 Pharyngeal Phase WFL    CHL IP CERVICAL ESOPHAGEAL PHASE 09/05/2020 Cervical Esophageal Phase Impaired   Celia B. Quentin Ore, [facility], [place] Speech Language Pathologist Office: [phone] Pager: [phone] Shonna Chock 09/05/2020, 12:52 PM              ECHOCARDIOGRAM COMPLETE  Result Date: 09/08/2020    ECHOCARDIOGRAM REPORT   Patient Name:   YUN GUTIERREZ [facility] Date of Exam: 09/08/2020 Medical Rec #:  [id number]        Height:       62.0 in Accession #:    [id number]       Weight:       106.3 lb Date of Birth:  [birth year]        BSA:          1.461 m Patient Age:    24 years         BP:           115/69 mmHg Patient Gender: F                HR:           57 bpm. Exam Location:  Inpatient Procedure: 2D Echo Indications:    CHF 428  History:        Patient has no prior history of Echocardiogram examinations.                 Risk Factors:Current Smoker and Hypertension.  Sonographer:    Jannett Celestine RDCS (AE) Referring Phys: [id number] Kayleen Memos  Sonographer Comments: Suboptimal parasternal window. very limited mobility. unable to turn on left side. IMPRESSIONS  1. Left ventricular ejection fraction, by estimation, is 55%. The left ventricle has normal function. The left ventricle demonstrates regional wall motion abnormalities with basal inferior and basal to mid inferolateral akinesis. Left ventricular diastolic parameters are consistent with Grade I diastolic dysfunction (impaired relaxation).  2. Right ventricular systolic function is normal. The right ventricular size is normal. Tricuspid regurgitation signal is inadequate for assessing PA pressure.  3. Left atrial size was moderately dilated.  4. The mitral valve is normal in structure. Trivial mitral  valve regurgitation. No evidence of mitral stenosis. Moderate mitral annular calcification.  5. The aortic valve was not well visualized. Aortic valve regurgitation is not visualized. No aortic stenosis is present.  6. The inferior vena cava is normal in size with greater than 50% respiratory variability, suggesting right atrial pressure of 3 mmHg.  7. Technically difficult study with poor acoustic windows. FINDINGS  Left Ventricle: Left ventricular ejection fraction, by estimation, is 55%. The left ventricle has normal function. The left ventricle demonstrates regional wall motion abnormalities. The left ventricular internal cavity size was normal in size. There is  no left ventricular hypertrophy. Left ventricular diastolic parameters are consistent with Grade I diastolic dysfunction (impaired relaxation). Right Ventricle: The right ventricular size is normal. No increase in right ventricular wall thickness. Right ventricular systolic function is normal. Tricuspid regurgitation signal is inadequate for assessing PA pressure. Left Atrium: Left atrial size was moderately dilated. Right Atrium: Right atrial size was normal in size. Pericardium: There is no evidence of pericardial effusion. Mitral Valve: The mitral valve  is normal in structure. Moderate mitral annular calcification. Trivial mitral valve regurgitation. No evidence of mitral valve stenosis. Tricuspid Valve: The tricuspid valve is normal in structure. Tricuspid valve regurgitation is not demonstrated. Aortic Valve: The aortic valve was not well visualized. Aortic valve regurgitation is not visualized. No aortic stenosis is present. Pulmonic Valve: The pulmonic valve was not well visualized. Pulmonic valve regurgitation is not visualized. Aorta: The aortic root was not well visualized. Venous: The inferior vena cava is normal in size with greater than 50% respiratory variability, suggesting right atrial pressure of 3 mmHg. IAS/Shunts: No atrial level  shunt detected by color flow Doppler.  LEFT VENTRICLE PLAX 2D LVOT diam:     1.70 cm  Diastology LV SV:         51       LV e' medial:    5.33 cm/s LV SV Index:   35       LV E/e' medial:  13.9 LVOT Area:     2.27 cm LV e' lateral:   5.77 cm/s                         LV E/e' lateral: 12.8  RIGHT VENTRICLE RV S prime:     16.10 cm/s TAPSE (M-mode): 1.7 cm LEFT ATRIUM             Index LA Vol (A2C):   50.0 ml 34.22 ml/m LA Vol (A4C):   82.9 ml 56.74 ml/m LA Biplane Vol: 63.8 ml 43.66 ml/m  AORTIC VALVE LVOT Vmax:   143.00 cm/s LVOT Vmean:  82.700 cm/s LVOT VTI:    0.226 m MITRAL VALVE MV Area (PHT): 3.68 cm    SHUNTS MV Decel Time: 206 msec    Systemic VTI:  0.23 m MV E velocity: 74.10 cm/s  Systemic Diam: 1.70 cm Loralie Champagne MD Electronically signed by Loralie Champagne MD Signature Date/Time: 09/08/2020/5:25:22 PM    Final    ECHOCARDIOGRAM LIMITED  Result Date: 09/11/2020    ECHOCARDIOGRAM LIMITED REPORT   Patient Name:   TANIYA DASHER [facility] Date of Exam: 09/11/2020 Medical Rec #:  [id number]        Height:       62.0 in Accession #:    [id number]       Weight:       101.0 lb Date of Birth:  January 22, 1951        BSA:          1.430 m Patient Age:    70 years         BP:           121/71 mmHg Patient Gender: F                HR:           85 bpm. Exam Location:  Inpatient Procedure: Limited Echo and Intracardiac Opacification Agent Indications:    CHF- Acute Diastolic V37.10; Needs Definity to rule out focal                 wall motion abnormalities.  History:        Patient has prior history of Echocardiogram examinations, most                 recent 09/08/2020. Signs/Symptoms:Murmur; Risk                 Factors:Hypertension.  Sonographer:    Mikki Santee RDCS (AE) Referring Phys: [id number]  TRACI R TURNER IMPRESSIONS  1. Left ventricular ejection fraction, by estimation, is 60 to 65%. The left ventricle has normal function. There is akinesis of the left ventricular, basal inferior wall. The inferolateral wall  appears normal. FINDINGS  Left Ventricle: Left ventricular ejection fraction, by estimation, is 60 to 65%. The left ventricle has normal function. Definity contrast agent was given IV to delineate the left ventricular endocardial borders. Fransico Him MD Electronically signed by Fransico Him MD Signature Date/Time: 09/11/2020/2:23:01 PM    Final    [place] EKG SITE RITE  Result Date: 09/06/2020 If Site Rite image not attached, placement could not be confirmed due to current cardiac rhythm.  US THORACENTESIS ASP PLEURAL SPACE W/IMG GUIDE  Result Date: 09/07/2020 INDICATION: Patient recently admitted for acute respiratory failure with hypoxia due to suspected aspiration pneumonia versus community acquired pneumonia. New onset bilateral pleural effusions. Interventional radiology asked to perform a therapeutic and diagnostic thoracentesis. EXAM: ULTRASOUND GUIDED THORACENTESIS MEDICATIONS: 1% lidocaine 10 mL COMPLICATIONS: None immediate. PROCEDURE: An ultrasound guided thoracentesis was thoroughly discussed with the patient and questions answered. The benefits, risks, alternatives and complications were also discussed. The patient understands and wishes to proceed with the procedure. Written consent was obtained. Ultrasound was performed to localize and mark an adequate pocket of fluid in the right chest. The area was then prepped and draped in the normal sterile fashion. 1% Lidocaine was used for local anesthesia. Under ultrasound guidance a 6 Fr Safe-T-Centesis catheter was introduced. Thoracentesis was performed. The catheter was removed and a dressing applied. FINDINGS: A total of approximately 550 mL of clear yellow fluid was removed. Samples were sent to the laboratory as requested by the clinical team. IMPRESSION: Successful ultrasound guided right thoracentesis yielding 550 mL of pleural fluid. Read by: Soyla Dryer, NP Electronically Signed   By: Jacqulynn Cadet M.D.   On: 09/07/2020 16:42     Assessment/Plan ***   Family/ staff Communication: ***  Labs/tests ordered:  ***  Goals of care:   Short-term rehabilitation.    Durenda Age, DNP, MSN, FNP-BC [facility] [phone] (Monday-Friday 8:00 a.m. - 5:00 p.m.) [phone] (after hours)

## 2020-10-03 NOTE — Progress Notes (Signed)
Location:  [facility] Room Number: 119-A Place of Service:  SNF (31) Provider:  Durenda Age, DNP, FNP-BC  Patient Care Team: Randa Evens as PCP - General (Physician Assistant) Sueanne Margarita, MD as PCP - Cardiology (Cardiology)  Extended Emergency Contact Information Primary Emergency Contact: Mcburney,Byrd Address: [address]          [place], [place] [address] Johnnette Litter of [employer] Phone: [phone] Relation: Brother  Code Status:  DNR  Goals of care: Advanced Directive information Advanced Directives 09/15/2020  Does Patient Have a Medical Advance Directive? Yes  Type of Advance Directive Out of facility DNR (pink MOST or yellow form)  Does patient want to make changes to medical advance directive? No - Patient declined  Would patient like information on creating a medical advance directive? -     Chief Complaint  Patient presents with   Discharge Note    Patient is seen for discharge from SNF    HPI:  Pt is a 69 y.o. female who is for discharge home on 10/04/20 with Home health PT and OT.  She was admitted to [facility] on 09/14/2020 post [facility] hospitalization 08/28/2020 to 09/14/2020 for multifactorial acute hypoxic and hypercarbic respiratory failure, aspiration versus community-acquired pneumonia, bilateral pleural effusions and left foot cellulitis.  She was intubated on 08/28/20 and extubated on 09/03/2020.  She has completed antibiotics for pneumonia.  She had right-sided thoracentesis with > 500 cc removed.  Patient was admitted to this facility for short-term rehabilitation after the patient's recent hospitalization.  Patient has completed SNF rehabilitation and therapy has cleared the patient for discharge.   Past Medical History:  Diagnosis Date   Abdominal pain, epigastric    Acid reflux    Pt was born with acute acid   AKI (acute kidney injury) ([place]) 09/15/2020   Anxiety    GERD  (gastroesophageal reflux disease)    Heart murmur    Hiatal hernia    Hypertension    Hyponatremia 02/10/2020   Schatzki's ring    Ulcer    8 years ago   Past Surgical History:  Procedure Laterality Date   ABDOMINAL HYSTERECTOMY     complete for fibroids   KNEE ARTHROSCOPY     right knee   shoulder repalcement     right   TONSILLECTOMY AND ADENOIDECTOMY      Allergies  Allergen Reactions   Iodine Shortness Of Breath and Rash   Thiazide-Type Diuretics Other (See Comments)    Severe hyponatremia July 2021   Shellfish Allergy     Causes rash    Outpatient Encounter Medications as of 10/03/2020  Medication Sig   acetaminophen (TYLENOL) 325 MG tablet Take 2 tablets (650 mg total) by mouth every 6 (six) hours as needed for mild pain, moderate pain, fever or headache.   alum & mag hydroxide-simeth (MAALOX/MYLANTA) 200-200-20 MG/5ML suspension Take 30 mLs by mouth every 4 (four) hours as needed for indigestion or heartburn.   docusate (COLACE) 50 MG/5ML liquid Take 10 mLs (100 mg total) by mouth 2 (two) times daily as needed for mild constipation.   Ensure (ENSURE) Take 237 mLs by mouth 2 (two) times daily between meals.   famotidine (PEPCID) 20 MG tablet Take 1 tablet (20 mg total) by mouth daily.   furosemide (LASIX) 20 MG tablet Take 1 tablet (20 mg total) by mouth daily.   gabapentin (NEURONTIN) 100 MG capsule Take 1 capsule (100 mg total) by mouth 2 (  two) times daily.   hydrOXYzine (ATARAX/VISTARIL) 25 MG tablet Take 25 mg by mouth every 8 (eight) hours as needed.   metoprolol tartrate (LOPRESSOR) 25 MG tablet Take 1 tablet (25 mg total) by mouth 2 (two) times daily.   nicotine (NICODERM CQ - DOSED IN MG/24 HOURS) 21 mg/24hr patch Place 1 patch (21 mg total) onto the skin daily.   Nutritional Supplements (NUTRITIONAL SUPPLEMENT PO) Take 1 each by mouth daily. Magic Cup   pantoprazole (PROTONIX) 40 MG tablet Take 1 tablet (40 mg total) by mouth 2 (two)  times daily.   polyethylene glycol (MIRALAX / GLYCOLAX) 17 g packet Take 17 g by mouth daily as needed for moderate constipation.   sodium chloride (OCEAN) 0.65 % nasal spray Place 2 sprays into the nose in the morning, at noon, and at bedtime.   traMADol (ULTRAM) 50 MG tablet Take 50 mg by mouth every 8 (eight) hours as needed for severe pain.   [DISCONTINUED] potassium chloride 20 MEQ/15ML (10%) SOLN Take 30 mLs (40 mEq total) by mouth 2 (two) times daily.   [DISCONTINUED] protein supplement (RESOURCE BENEPROTEIN) 6 g POWD Take 1 Scoop (6 g total) by mouth 3 (three) times daily with meals.   [DISCONTINUED] traMADol (ULTRAM) 50 MG tablet Take 1 tablet (50 mg total) by mouth every 12 (twelve) hours as needed for severe pain.   No facility-administered encounter medications on file as of 10/03/2020.    Review of Systems  GENERAL: No change in appetite, no fatigue, no fever, chills or weakness MOUTH and THROAT: Denies oral discomfort, gingival pain or bleeding RESPIRATORY: no cough, SOB, DOE, wheezing, hemoptysis CARDIAC: No chest pain, edema or palpitations GI: No abdominal pain, diarrhea, constipation, heart burn, nausea or vomiting GU: Denies dysuria, frequency, hematuria, incontinence, or discharge NEUROLOGICAL: Denies dizziness, syncope, numbness, or headache PSYCHIATRIC: Denies feelings of depression or anxiety. No report of hallucinations, insomnia, paranoia, or agitation    Immunization History  Administered Date(s) Administered   Influenza Split 07/01/2015, 09/16/2017   PFIZER SARS-COV-2 Vaccination 01/14/2020, 02/13/2020   Pneumococcal Polysaccharide-23 07/01/2015   Tdap 05/03/2015, 05/18/2020   Zoster 07/01/2015   Pertinent  Health Maintenance Due  Topic Date Due   MAMMOGRAM  12/19/2014   DEXA SCAN  Never done   PNA vac Low Risk Adult (1 of 2 - PCV13) 08/12/2018   COLONOSCOPY  03/21/2020   INFLUENZA VACCINE  06/12/2020   No flowsheet data  found.   Vitals:   10/03/20 1409  BP: 114/68  Pulse: 80  Resp: 18  Temp: (!) 96.6 F (35.9 C)  TempSrc: Oral  Weight: 88 lb (39.9 kg)  Height: 5\' 2"  (1.575 m)   Body mass index is 16.1 kg/m.  Physical Exam  GENERAL APPEARANCE:  In no acute distress.  SKIN:  Skin is warm and dry.  MOUTH and THROAT: Lips are without lesions. Oral mucosa is moist and without lesions. Tongue is normal in shape, size, and color and without lesions RESPIRATORY: Breathing is even & unlabored, BS CTAB CARDIAC: RRR, no murmur,no extra heart sounds, no edema GI: Abdomen soft, normal BS, no masses, no tenderness EXTREMITIES:  Able to move X 4 extremities NEUROLOGICAL: There is no tremor. Speech is clear. Alert and oriented X 3. PSYCHIATRIC:  Affect and behavior are appropriate  Labs reviewed: Recent Labs    09/06/20 0500 09/06/20 0500 09/07/20 0230 09/07/20 0230 09/08/20 0436 09/08/20 1902 09/12/20 0813 09/12/20 0813 09/13/20 0430 09/14/20 0415 09/29/20 0000  NA 141   < >  140   < > 136   < > 127*   < > 130* 130* 131*  K 4.3   < > 4.5   < > 4.1   < > 3.5   < > 4.2 3.7 5.1  CL 106   < > 104   < > 100   < > 83*   < > 86* 86* 93*  CO2 30   < > 31   < > 31   < > 34*   < > 37* 38* 29*  GLUCOSE 129*   < > 127*   < > 114*   < > 103*  --  92 133*  --   BUN 24*   < > 28*   < > 28*   < > 17   < > 14 13 10   CREATININE 0.46   < > 0.52   < > 0.45   < > 0.64   < > 0.68 0.66 0.8  CALCIUM 8.1*   < > 8.2*   < > 7.7*   < > 7.8*   < > 8.0* 7.9* 9.3  MG 2.0   < > 2.1  --  2.0  --  2.0  --   --   --   --   PHOS 3.2  --  3.3  --  3.4  --   --   --   --   --   --    < > = values in this interval not displayed.   Recent Labs    09/07/20 0230 09/08/20 0436 09/09/20 1430  AST 48* 37 37  ALT 48* 38 36  ALKPHOS 213* 155* 138*  BILITOT 0.3 0.4 0.4  PROT 4.9* 4.2* 4.5*  ALBUMIN 2.2* 1.9* 1.9*   Recent Labs    09/11/20 0812 09/11/20 0812 09/12/20 0813 09/14/20 0415 09/29/20 0000  WBC 15.7*   < > 14.5*  7.4 9.8  NEUTROABS 12.2*   < > 10.8* 4.6 6.10  HGB 8.3*   < > 8.7* 7.6* 9.3*  HCT 24.3*   < > 25.8* 22.8* 28*  MCV 88.0  --  88.7 89.8  --   PLT 486*   < > 565* 543* 684*   < > = values in this interval not displayed.   Lab Results  Component Value Date   TSH 1.536 09/07/2020   Lab Results  Component Value Date   HGBA1C 5.2 08/28/2020   Lab Results  Component Value Date   CHOL 276 (H) 09/11/2013   HDL 61.40 09/11/2013   LDLCALC 59 05/26/2011   LDLDIRECT 207.0 09/11/2013   TRIG 115 09/05/2020   CHOLHDL 4 09/11/2013    Significant Diagnostic Results in last 30 days:  DG Chest 1 View  Result Date: 09/07/2020 CLINICAL DATA:  69 year old female status post thoracentesis. EXAM: CHEST  1 VIEW COMPARISON:  Chest radiograph dated 09/06/2020. FINDINGS: Small bilateral pleural effusions relatively similar to the prior radiograph. There are bibasilar atelectasis or infiltrate. No pneumothorax. Stable cardiomegaly. Left-sided PICC with tip at the cavoatrial junction. No acute osseous pathology. Right shoulder arthroplasty. IMPRESSION: Small bilateral pleural effusions with bibasilar atelectasis or infiltrate. No pneumothorax. Electronically Signed   By: Anner Crete M.D.   On: 09/07/2020 16:16   DG Chest 1 View  Result Date: 09/06/2020 CLINICAL DATA:  Decreased oxygen saturation and patient responsiveness. EXAM: CHEST  1 VIEW COMPARISON:  09/04/2020 FINDINGS: Stable right arm PICC line. There is considerable rotation artifact. Moderate to  large bilateral pleural effusions are unchanged. Mild interstitial edema identified. IMPRESSION: 1. Bilateral pleural effusions and mild interstitial edema, unchanged. Electronically Signed   By: Kerby Moors M.D.   On: 09/06/2020 08:32   DG Chest 2 View  Result Date: 09/11/2020 CLINICAL DATA:  Weakness.  CHF. EXAM: CHEST - 2 VIEW COMPARISON:  09/07/2020 FINDINGS: Moderate bilateral pleural effusions obscure the hemidiaphragms and portions of the  heart border, unchanged from the prior study. There is additional AC opacity at the lung bases that is likely atelectasis. No evidence of pulmonary edema. No pneumothorax. Cardiac silhouette is partly obscured, grossly normal in size. No mediastinal or hilar masses. Left sided PICC is stable, tip in the lower superior vena cava. IMPRESSION: 1. Findings similar to the prior exam. Moderate bilateral pleural effusions with basilar atelectasis. No convincing pneumonia and no pulmonary edema. Electronically Signed   By: Lajean Manes M.D.   On: 09/11/2020 11:51   DG Swallowing Func-Speech Pathology  Result Date: 09/05/2020 Objective Swallowing Evaluation: Type of Study: MBS-Modified Barium Swallow Study  Patient Details Name: [name] MRN: [id number] Date of Birth: 29-May-1951 Today's Date: 09/05/2020 Time: SLP Start Time (ACUTE ONLY): 36 -SLP Stop Time (ACUTE ONLY): 1230 SLP Time Calculation (min) (ACUTE ONLY): 25 min Past Medical History: Past Medical History: Diagnosis Date  Abdominal pain, epigastric   Acid reflux   Pt was born with acute acid  Anxiety   GERD (gastroesophageal reflux disease)   Heart murmur   Hiatal hernia   Hypertension   Hyponatremia 02/10/2020  Schatzki's ring   Ulcer   8 years ago Past Surgical History: Past Surgical History: Procedure Laterality Date  ABDOMINAL HYSTERECTOMY    complete for fibroids  KNEE ARTHROSCOPY    right knee  shoulder repalcement    right  TONSILLECTOMY AND ADENOIDECTOMY   HPI: 69 yo F with a history of hiatal hernia, schatski ring, hyponatremia who was found down at home by her brother after she was last known normal yesterday.  When EMS arrived, her o2 sats were in the 67s and had difficulty breathing.  She was thus placed on NRB, and when assessed in the ED, decision was made to intubate.   CXR showed right sided atelectasis and likely infiltrate 09/04/20 CXR indicated: Continued hazy opacity at the bases where there was atelectasis and  .airspace disease on recent chest CT. There is also bilateral pleural  Subjective: Pt seen in radiology for MBS. States she is "very weak" today Assessment / Plan / Recommendation CHL IP CLINICAL IMPRESSIONS 09/05/2020 Clinical Impression Pt exhibits normal oral and pharyngeal swallow function. No oral residue or anterior leakage, and no penetration/aspiration or post-swallow residue seen. Esophageal sweep revealed stasis throughout the upper-mid esophagus several minutes after PO trials had ended. Recommend dys 3 diet with solids cut into small pieces (for energy conservation), thin liquids, meds one at a time. Pt was given written behavioral and dietary strategies for management of esophageal dysmotility. SLP will follow to assess diet tolerance and continue education.  SLP Visit Diagnosis Dysphagia, pharyngoesophageal phase (R13.14)     Impact on safety and function Mild - Moderate aspiration risk Risk for inadequate nutrition/hydration     Prognosis 09/05/2020 Prognosis for Safe Diet Advancement Fair Barriers to Reach Goals Severity of deficits Barriers/Prognosis Comment Significant esophageal dysmotility observed CHL IP DIET RECOMMENDATION 09/05/2020 SLP Diet Recommendations Dysphagia 3 (Mech soft) solids;Thin liquid Liquid Administration via Cup;Straw Medication Administration Whole meds with liquid Compensations Minimize environmental distractions Slow rate Small sips/bites  Postural Changes Seated upright at 90 degrees Remain semi-upright after after meals    CHL IP OTHER RECOMMENDATIONS 09/05/2020   Oral Care Recommendations Oral care BID            CHL IP ORAL PHASE 09/05/2020 Oral Phase WFL    CHL IP PHARYNGEAL PHASE 09/05/2020 Pharyngeal Phase WFL    CHL IP CERVICAL ESOPHAGEAL PHASE 09/05/2020 Cervical Esophageal Phase Impaired   Celia B. Quentin Ore, [facility], [place] Speech Language Pathologist Office: [phone] Pager: [phone] Shonna Chock 09/05/2020, 12:52 PM              ECHOCARDIOGRAM  COMPLETE  Result Date: 09/08/2020    ECHOCARDIOGRAM REPORT   Patient Name:   CATALAYA GARR [facility] Date of Exam: 09/08/2020 Medical Rec #:  [id number]        Height:       62.0 in Accession #:    [id number]       Weight:       106.3 lb Date of Birth:  1951-04-26        BSA:          1.461 m Patient Age:    78 years         BP:           115/69 mmHg Patient Gender: F                HR:           57 bpm. Exam Location:  Inpatient Procedure: 2D Echo Indications:    CHF 428  History:        Patient has no prior history of Echocardiogram examinations.                 Risk Factors:Current Smoker and Hypertension.  Sonographer:    Jannett Celestine RDCS (AE) Referring Phys: [id number] Kayleen Memos  Sonographer Comments: Suboptimal parasternal window. very limited mobility. unable to turn on left side. IMPRESSIONS  1. Left ventricular ejection fraction, by estimation, is 55%. The left ventricle has normal function. The left ventricle demonstrates regional wall motion abnormalities with basal inferior and basal to mid inferolateral akinesis. Left ventricular diastolic parameters are consistent with Grade I diastolic dysfunction (impaired relaxation).  2. Right ventricular systolic function is normal. The right ventricular size is normal. Tricuspid regurgitation signal is inadequate for assessing PA pressure.  3. Left atrial size was moderately dilated.  4. The mitral valve is normal in structure. Trivial mitral valve regurgitation. No evidence of mitral stenosis. Moderate mitral annular calcification.  5. The aortic valve was not well visualized. Aortic valve regurgitation is not visualized. No aortic stenosis is present.  6. The inferior vena cava is normal in size with greater than 50% respiratory variability, suggesting right atrial pressure of 3 mmHg.  7. Technically difficult study with poor acoustic windows. FINDINGS  Left Ventricle: Left ventricular ejection fraction, by estimation, is 55%. The left ventricle has normal  function. The left ventricle demonstrates regional wall motion abnormalities. The left ventricular internal cavity size was normal in size. There is  no left ventricular hypertrophy. Left ventricular diastolic parameters are consistent with Grade I diastolic dysfunction (impaired relaxation). Right Ventricle: The right ventricular size is normal. No increase in right ventricular wall thickness. Right ventricular systolic function is normal. Tricuspid regurgitation signal is inadequate for assessing PA pressure. Left Atrium: Left atrial size was moderately dilated. Right Atrium: Right atrial size was normal in size. Pericardium: There is no evidence of pericardial effusion. Mitral  Valve: The mitral valve is normal in structure. Moderate mitral annular calcification. Trivial mitral valve regurgitation. No evidence of mitral valve stenosis. Tricuspid Valve: The tricuspid valve is normal in structure. Tricuspid valve regurgitation is not demonstrated. Aortic Valve: The aortic valve was not well visualized. Aortic valve regurgitation is not visualized. No aortic stenosis is present. Pulmonic Valve: The pulmonic valve was not well visualized. Pulmonic valve regurgitation is not visualized. Aorta: The aortic root was not well visualized. Venous: The inferior vena cava is normal in size with greater than 50% respiratory variability, suggesting right atrial pressure of 3 mmHg. IAS/Shunts: No atrial level shunt detected by color flow Doppler.  LEFT VENTRICLE PLAX 2D LVOT diam:     1.70 cm  Diastology LV SV:         51       LV e' medial:    5.33 cm/s LV SV Index:   35       LV E/e' medial:  13.9 LVOT Area:     2.27 cm LV e' lateral:   5.77 cm/s                         LV E/e' lateral: 12.8  RIGHT VENTRICLE RV S prime:     16.10 cm/s TAPSE (M-mode): 1.7 cm LEFT ATRIUM             Index LA Vol (A2C):   50.0 ml 34.22 ml/m LA Vol (A4C):   82.9 ml 56.74 ml/m LA Biplane Vol: 63.8 ml 43.66 ml/m  AORTIC VALVE LVOT Vmax:   143.00  cm/s LVOT Vmean:  82.700 cm/s LVOT VTI:    0.226 m MITRAL VALVE MV Area (PHT): 3.68 cm    SHUNTS MV Decel Time: 206 msec    Systemic VTI:  0.23 m MV E velocity: 74.10 cm/s  Systemic Diam: 1.70 cm Loralie Champagne MD Electronically signed by Loralie Champagne MD Signature Date/Time: 09/08/2020/5:25:22 PM    Final    ECHOCARDIOGRAM LIMITED  Result Date: 09/11/2020    ECHOCARDIOGRAM LIMITED REPORT   Patient Name:   ZYKERIA LAGUARDIA [facility] Date of Exam: 09/11/2020 Medical Rec #:  [id number]        Height:       62.0 in Accession #:    [id number]       Weight:       101.0 lb Date of Birth:  1951/02/25        BSA:          1.430 m Patient Age:    83 years         BP:           121/71 mmHg Patient Gender: F                HR:           85 bpm. Exam Location:  Inpatient Procedure: Limited Echo and Intracardiac Opacification Agent Indications:    CHF- Acute Diastolic V61.60; Needs Definity to rule out focal                 wall motion abnormalities.  History:        Patient has prior history of Echocardiogram examinations, most                 recent 09/08/2020. Signs/Symptoms:Murmur; Risk                 Factors:Hypertension.  Sonographer:    Mikki Santee RDCS (  AE) Referring Phys: [place]  1. Left ventricular ejection fraction, by estimation, is 60 to 65%. The left ventricle has normal function. There is akinesis of the left ventricular, basal inferior wall. The inferolateral wall appears normal. FINDINGS  Left Ventricle: Left ventricular ejection fraction, by estimation, is 60 to 65%. The left ventricle has normal function. Definity contrast agent was given IV to delineate the left ventricular endocardial borders. Fransico Him MD Electronically signed by Fransico Him MD Signature Date/Time: 09/11/2020/2:23:01 PM    Final    [place] EKG SITE RITE  Result Date: 09/06/2020 If Site Rite image not attached, placement could not be confirmed due to current cardiac rhythm.  US THORACENTESIS ASP PLEURAL SPACE  W/IMG GUIDE  Result Date: 09/07/2020 INDICATION: Patient recently admitted for acute respiratory failure with hypoxia due to suspected aspiration pneumonia versus community acquired pneumonia. New onset bilateral pleural effusions. Interventional radiology asked to perform a therapeutic and diagnostic thoracentesis. EXAM: ULTRASOUND GUIDED THORACENTESIS MEDICATIONS: 1% lidocaine 10 mL COMPLICATIONS: None immediate. PROCEDURE: An ultrasound guided thoracentesis was thoroughly discussed with the patient and questions answered. The benefits, risks, alternatives and complications were also discussed. The patient understands and wishes to proceed with the procedure. Written consent was obtained. Ultrasound was performed to localize and mark an adequate pocket of fluid in the right chest. The area was then prepped and draped in the normal sterile fashion. 1% Lidocaine was used for local anesthesia. Under ultrasound guidance a 6 Fr Safe-T-Centesis catheter was introduced. Thoracentesis was performed. The catheter was removed and a dressing applied. FINDINGS: A total of approximately 550 mL of clear yellow fluid was removed. Samples were sent to the laboratory as requested by the clinical team. IMPRESSION: Successful ultrasound guided right thoracentesis yielding 550 mL of pleural fluid. Read by: Soyla Dryer, NP Electronically Signed   By: Jacqulynn Cadet M.D.   On: 09/07/2020 16:42    Assessment/Plan  1. Acute diastolic congestive heart failure (HCC) - furosemide (LASIX) 20 MG tablet; Take 1 tablet (20 mg total) by mouth daily.  Dispense: 30 tablet; Refill: 0 - metoprolol tartrate (LOPRESSOR) 25 MG tablet; Take 1 tablet (25 mg total) by mouth 2 (two) times daily.  Dispense: 60 tablet; Refill: 0  2. Community acquired pneumonia of right lower lobe of lung -  Completed antibiotic treatment in the hospital, resolved  3. Acute respiratory failure with hypoxia and hypercapnia (HCC) - was intubated in the  hospital and now on room air, resolved  4. Hypokalemia Lab Results  Component Value Date   K 5.1 09/29/2020   - decreased frequency from BID to daily - potassium chloride 20 MEQ/15ML (10%) SOLN; Take 15 mLs (20 mEq total) by mouth daily.  Dispense: 473 mL; Refill: 0  5. History of SIADH Lab Results  Component Value Date   NA 131 (A) 09/29/2020   K 5.1 09/29/2020   CO2 29 (A) 09/29/2020   GLUCOSE 133 (H) 09/14/2020   BUN 10 09/29/2020   CREATININE 0.8 09/29/2020   CALCIUM 9.3 09/29/2020   GFRNONAA 82.67 09/29/2020   GFRAA 90 09/29/2020   -  Will need to be monitored  6. Gastroesophageal reflux disease without esophagitis - famotidine (PEPCID) 20 MG tablet; Take 1 tablet (20 mg total) by mouth daily.  Dispense: 30 tablet; Refill: 0 - pantoprazole (PROTONIX) 40 MG tablet; Take 1 tablet (40 mg total) by mouth 2 (two) times daily.  Dispense: 60 tablet; Refill: 0  7. Tobacco use - nicotine (NICODERM CQ -  DOSED IN MG/24 HOURS) 21 mg/24hr patch; Place 1 patch (21 mg total) onto the skin daily.  Dispense: 28 patch; Refill: 0  8. Situational anxiety - hydrOXYzine (ATARAX/VISTARIL) 25 MG tablet; Take 1 tablet (25 mg total) by mouth every 8 (eight) hours as needed for up to 14 days.  Dispense: 42 tablet; Refill: 0  9. Neuropathy - gabapentin (NEURONTIN) 100 MG capsule; Take 1 capsule (100 mg total) by mouth 2 (two) times daily.  Dispense: 60 capsule; Refill: 0 - traMADol (ULTRAM) 50 MG tablet; Take 1 tablet (50 mg total) by mouth every 8 (eight) hours as needed for severe pain.  Dispense: 30 tablet; Refill: 0      I have filled out patient's discharge paperwork and e-prescribed medications.  Patient will receive home health PT and OT.  DME provided:  Shower bench  Total discharge time: Greater than 30 minutes Greater than 50% was spent in counseling and coordination of care.   Discharge time involved coordination of the discharge process with social worker, nursing staff and  therapy department. Medical justification for home health services/DME verified.    Durenda Age, DNP, MSN, FNP-BC [facility] [phone] (Monday-Friday 8:00 a.m. - 5:00 p.m.) [phone] (after hours)

## 2020-10-03 NOTE — Progress Notes (Deleted)
Location:  Comfort Room Number: 119-A Place of Service:  SNF (31) Provider:  Durenda Age, DNP, FNP-BC  Patient Care Team: Randa Evens as PCP - General (Physician Assistant) Sueanne Margarita, MD as PCP - Cardiology (Cardiology)  Extended Emergency Contact Information Primary Emergency Contact: Chaffin,Byrd Address: [address]          [place], [place] [address] Johnnette Litter of [employer] Phone: [phone] Relation: Brother  Code Status:  FULL CODE  Goals of care: Advanced Directive information Advanced Directives 09/15/2020  Does Patient Have a Medical Advance Directive? Yes  Type of Advance Directive Out of facility DNR (pink MOST or yellow form)  Does patient want to make changes to medical advance directive? No - Patient declined  Would patient like information on creating a medical advance directive? -     Chief Complaint  Patient presents with  . Discharge Note    Patient is seen for discharge from SNF    HPI:  Pt is a 69 y.o. female seen today for discharge from SNF. She has a PMH of ulcer, chronic GERD, hiatal hernia/Schatzki's ring, thiazide-induced hyponatremia, essential HTN, and anxiety. ***    Past Medical History:  Diagnosis Date  . Abdominal pain, epigastric   . Acid reflux    Pt was born with acute acid  . AKI (acute kidney injury) ([place]) 09/15/2020  . Anxiety   . GERD (gastroesophageal reflux disease)   . Heart murmur   . Hiatal hernia   . Hypertension   . Hyponatremia 02/10/2020  . Schatzki's ring   . Ulcer    8 years ago   Past Surgical History:  Procedure Laterality Date  . ABDOMINAL HYSTERECTOMY     complete for fibroids  . KNEE ARTHROSCOPY     right knee  . shoulder repalcement     right  . TONSILLECTOMY AND ADENOIDECTOMY      Allergies  Allergen Reactions  . Iodine Shortness Of Breath and Rash  . Thiazide-Type Diuretics Other (See Comments)    Severe hyponatremia July 2021  . Shellfish  Allergy     Causes rash    Outpatient Encounter Medications as of 10/03/2020  Medication Sig  . acetaminophen (TYLENOL) 325 MG tablet Take 2 tablets (650 mg total) by mouth every 6 (six) hours as needed for mild pain, moderate pain, fever or headache.  Marland Kitchen alum & mag hydroxide-simeth (MAALOX/MYLANTA) 200-200-20 MG/5ML suspension Take 30 mLs by mouth every 4 (four) hours as needed for indigestion or heartburn.  . docusate (COLACE) 50 MG/5ML liquid Take 10 mLs (100 mg total) by mouth 2 (two) times daily as needed for mild constipation.  . Ensure (ENSURE) Take 237 mLs by mouth 2 (two) times daily between meals.  . famotidine (PEPCID) 20 MG tablet Take 1 tablet (20 mg total) by mouth daily.  . furosemide (LASIX) 20 MG tablet Take 1 tablet (20 mg total) by mouth daily.  Marland Kitchen gabapentin (NEURONTIN) 100 MG capsule Take 1 capsule (100 mg total) by mouth 2 (two) times daily.  . hydrOXYzine (ATARAX/VISTARIL) 25 MG tablet Take 25 mg by mouth every 8 (eight) hours as needed.  . metoprolol tartrate (LOPRESSOR) 25 MG tablet Take 1 tablet (25 mg total) by mouth 2 (two) times daily.  . nicotine (NICODERM CQ - DOSED IN MG/24 HOURS) 21 mg/24hr patch Place 1 patch (21 mg total) onto the skin daily.  . Nutritional Supplements (NUTRITIONAL SUPPLEMENT PO) Take 1 each by mouth daily. Magic Cup  .  pantoprazole (PROTONIX) 40 MG tablet Take 1 tablet (40 mg total) by mouth 2 (two) times daily.  . polyethylene glycol (MIRALAX / GLYCOLAX) 17 g packet Take 17 g by mouth daily as needed for moderate constipation.  . potassium chloride 20 MEQ/15ML (10%) SOLN Take 30 mLs (40 mEq total) by mouth 2 (two) times daily.  . sodium chloride (OCEAN) 0.65 % nasal spray Place 2 sprays into the nose in the morning, at noon, and at bedtime.  . traMADol (ULTRAM) 50 MG tablet Take 50 mg by mouth every 8 (eight) hours as needed for severe pain.  . [DISCONTINUED] protein supplement (RESOURCE BENEPROTEIN) 6 g POWD Take 1 Scoop (6 g total) by mouth  3 (three) times daily with meals.  . [DISCONTINUED] traMADol (ULTRAM) 50 MG tablet Take 1 tablet (50 mg total) by mouth every 12 (twelve) hours as needed for severe pain.   No facility-administered encounter medications on file as of 10/03/2020.    Review of Systems  GENERAL: No change in appetite, no fatigue, no weight changes, no fever, chills or weakness SKIN: Denies rash, itching, wounds, ulcer sores, or nail abnormalities EYES: Denies change in vision, dry eyes, eye pain, itching or discharge EARS: Denies change in hearing, ringing in ears, or earache NOSE: Denies nasal congestion or epistaxis MOUTH and THROAT: Denies oral discomfort, gingival pain or bleeding, pain from teeth or hoarseness   RESPIRATORY: no cough, SOB, DOE, wheezing, hemoptysis CARDIAC: No chest pain, edema or palpitations GI: No abdominal pain, diarrhea, constipation, heart burn, nausea or vomiting GU: Denies dysuria, frequency, hematuria, incontinence, or discharge MUSCULOSKELETAL: Denies joint pain, muscle pain, back pain, restricted movement, or unusual weakness CIRCULATION: Denies claudication, edema of legs, varicosities, or cold extremities NEUROLOGICAL: Denies dizziness, syncope, numbness, or headache PSYCHIATRIC: Denies feelings of depression or anxiety. No report of hallucinations, insomnia, paranoia, or agitation ENDOCRINE: Denies polyphagia, polyuria, polydipsia, heat or cold intolerance HEME/LYMPH: Denies excessive bruising, petechia, enlarged lymph nodes, or bleeding problems IMMUNOLOGIC: Denies history of frequent infections, AIDS, or use of immunosuppressive agents   Immunization History  Administered Date(s) Administered  . Influenza Split 07/01/2015, 09/16/2017  . PFIZER SARS-COV-2 Vaccination 01/14/2020, 02/13/2020  . Pneumococcal Polysaccharide-23 07/01/2015  . Tdap 05/03/2015, 05/18/2020  . Zoster 07/01/2015   Pertinent  Health Maintenance Due  Topic Date Due  . MAMMOGRAM  12/19/2014   . DEXA SCAN  Never done  . PNA vac Low Risk Adult (1 of 2 - PCV13) 08/12/2018  . COLONOSCOPY  03/21/2020  . INFLUENZA VACCINE  06/12/2020    Vitals:   10/03/20 1409  BP: 114/68  Pulse: 80  Resp: 18  Temp: (!) 96.6 F (35.9 C)  TempSrc: Oral  Weight: 88 lb (39.9 kg)  Height: 5\' 2"  (1.575 m)   Body mass index is 16.1 kg/m.  Physical Exam  GENERAL APPEARANCE: Well nourished. In no acute distress. Normal body habitus SKIN:  Skin is warm and dry. There are no suspicious lesions or rash HEAD: Normal in size and contour. No evidence of trauma EYES: Lids open and close normally. No blepharitis, entropion or ectropion. PERRL. Conjunctivae are clear and sclerae are white. Lenses are without opacity EARS: Pinnae are normal. Patient hears normal voice tunes of the examiner MOUTH and THROAT: Lips are without lesions. Oral mucosa is moist and without lesions. Tongue is normal in shape, size, and color and without lesions NECK: supple, trachea midline, no neck masses, no thyroid tenderness, no thyromegaly LYMPHATICS: No LAN in the neck, no supraclavicular LAN  RESPIRATORY: Breathing is even & unlabored, BS CTAB CARDIAC: RRR, no murmur,no extra heart sounds, no edema GI: Abdomen soft, normal BS, no masses, no tenderness, no hepatomegaly, no splenomegaly MUSCULOSKELETAL: No deformities. Movement at each extremity is full and painless. Strength is 5/5 at each extremity. Back is without kyphosis or scoliosis CIRCULATION: Pedal pulses are 2+. There is no edema of the legs, ankles and feet NEUROLOGICAL: There is no tremor. Speech is clear PSYCHIATRIC: Alert and oriented X 3. Affect and behavior are appropriate  Labs reviewed: Recent Labs    09/06/20 0500 09/06/20 0500 09/07/20 0230 09/07/20 0230 09/08/20 0436 09/08/20 1902 09/12/20 0813 09/13/20 0430 09/14/20 0415  NA 141   < > 140   < > 136   < > 127* 130* 130*  K 4.3   < > 4.5   < > 4.1   < > 3.5 4.2 3.7  CL 106   < > 104   < > 100    < > 83* 86* 86*  CO2 30   < > 31   < > 31   < > 34* 37* 38*  GLUCOSE 129*   < > 127*   < > 114*   < > 103* 92 133*  BUN 24*   < > 28*   < > 28*   < > 17 14 13   CREATININE 0.46   < > 0.52   < > 0.45   < > 0.64 0.68 0.66  CALCIUM 8.1*   < > 8.2*   < > 7.7*   < > 7.8* 8.0* 7.9*  MG 2.0   < > 2.1  --  2.0  --  2.0  --   --   PHOS 3.2  --  3.3  --  3.4  --   --   --   --    < > = values in this interval not displayed.   Recent Labs    09/07/20 0230 09/08/20 0436 09/09/20 1430  AST 48* 37 37  ALT 48* 38 36  ALKPHOS 213* 155* 138*  BILITOT 0.3 0.4 0.4  PROT 4.9* 4.2* 4.5*  ALBUMIN 2.2* 1.9* 1.9*   Recent Labs    09/11/20 0812 09/12/20 0813 09/14/20 0415  WBC 15.7* 14.5* 7.4  NEUTROABS 12.2* 10.8* 4.6  HGB 8.3* 8.7* 7.6*  HCT 24.3* 25.8* 22.8*  MCV 88.0 88.7 89.8  PLT 486* 565* 543*   Lab Results  Component Value Date   TSH 1.536 09/07/2020   Lab Results  Component Value Date   HGBA1C 5.2 08/28/2020   Lab Results  Component Value Date   CHOL 276 (H) 09/11/2013   HDL 61.40 09/11/2013   LDLCALC 59 05/26/2011   LDLDIRECT 207.0 09/11/2013   TRIG 115 09/05/2020   CHOLHDL 4 09/11/2013    Significant Diagnostic Results in last 30 days:  DG Chest 1 View  Result Date: 09/07/2020 CLINICAL DATA:  69 year old female status post thoracentesis. EXAM: CHEST  1 VIEW COMPARISON:  Chest radiograph dated 09/06/2020. FINDINGS: Small bilateral pleural effusions relatively similar to the prior radiograph. There are bibasilar atelectasis or infiltrate. No pneumothorax. Stable cardiomegaly. Left-sided PICC with tip at the cavoatrial junction. No acute osseous pathology. Right shoulder arthroplasty. IMPRESSION: Small bilateral pleural effusions with bibasilar atelectasis or infiltrate. No pneumothorax. Electronically Signed   By: Anner Crete M.D.   On: 09/07/2020 16:16   DG Chest 1 View  Result Date: 09/06/2020 CLINICAL DATA:  Decreased oxygen saturation and patient responsiveness.  EXAM: CHEST  1 VIEW COMPARISON:  09/04/2020 FINDINGS: Stable right arm PICC line. There is considerable rotation artifact. Moderate to large bilateral pleural effusions are unchanged. Mild interstitial edema identified. IMPRESSION: 1. Bilateral pleural effusions and mild interstitial edema, unchanged. Electronically Signed   By: Kerby Moors M.D.   On: 09/06/2020 08:32   DG Chest 2 View  Result Date: 09/11/2020 CLINICAL DATA:  Weakness.  CHF. EXAM: CHEST - 2 VIEW COMPARISON:  09/07/2020 FINDINGS: Moderate bilateral pleural effusions obscure the hemidiaphragms and portions of the heart border, unchanged from the prior study. There is additional AC opacity at the lung bases that is likely atelectasis. No evidence of pulmonary edema. No pneumothorax. Cardiac silhouette is partly obscured, grossly normal in size. No mediastinal or hilar masses. Left sided PICC is stable, tip in the lower superior vena cava. IMPRESSION: 1. Findings similar to the prior exam. Moderate bilateral pleural effusions with basilar atelectasis. No convincing pneumonia and no pulmonary edema. Electronically Signed   By: Lajean Manes M.D.   On: 09/11/2020 11:51   DG Chest Port 1 View  Result Date: 09/04/2020 CLINICAL DATA:  Shortness of breath EXAM: PORTABLE CHEST 1 VIEW COMPARISON:  Yesterday FINDINGS: Extubation with stable inflation. Layering pleural effusions. Prominent rightward mediastinal convexity presumably from rotation. No aortic aneurysm or hilar mass on recent chest CT. Right PICC with tip at the SVC. IMPRESSION: Continued hazy opacity at the bases where there was atelectasis and airspace disease on recent chest CT. There is also bilateral pleural effusion. Electronically Signed   By: Monte Fantasia M.D.   On: 09/04/2020 11:00   DG Swallowing Func-Speech Pathology  Result Date: 09/05/2020 Objective Swallowing Evaluation: Type of Study: MBS-Modified Barium Swallow Study  Patient Details Name: Yanelly Cantrelle MRN:  [id number] Date of Birth: [birth year] Today's Date: 09/05/2020 Time: SLP Start Time (ACUTE ONLY): 1205 -SLP Stop Time (ACUTE ONLY): 1230 SLP Time Calculation (min) (ACUTE ONLY): 25 min Past Medical History: Past Medical History: Diagnosis Date . Abdominal pain, epigastric  . Acid reflux   Pt was born with acute acid . Anxiety  . GERD (gastroesophageal reflux disease)  . Heart murmur  . Hiatal hernia  . Hypertension  . Hyponatremia 02/10/2020 . Schatzki's ring  . Ulcer   8 years ago Past Surgical History: Past Surgical History: Procedure Laterality Date . ABDOMINAL HYSTERECTOMY    complete for fibroids . KNEE ARTHROSCOPY    right knee . shoulder repalcement    right . TONSILLECTOMY AND ADENOIDECTOMY   HPI: 69 yo F with a history of hiatal hernia, schatski ring, hyponatremia who was found down at home by her brother after she was last known normal yesterday.  When EMS arrived, her o2 sats were in the 10s and had difficulty breathing.  She was thus placed on NRB, and when assessed in the ED, decision was made to intubate.   CXR showed right sided atelectasis and likely infiltrate 09/04/20 CXR indicated: Continued hazy opacity at the bases where there was atelectasis and .airspace disease on recent chest CT. There is also bilateral pleural  Subjective: Pt seen in radiology for MBS. States she is "very weak" today Assessment / Plan / Recommendation CHL IP CLINICAL IMPRESSIONS 09/05/2020 Clinical Impression Pt exhibits normal oral and pharyngeal swallow function. No oral residue or anterior leakage, and no penetration/aspiration or post-swallow residue seen. Esophageal sweep revealed stasis throughout the upper-mid esophagus several minutes after PO trials had ended. Recommend dys 3 diet with solids cut into small pieces (for  energy conservation), thin liquids, meds one at a time. Pt was given written behavioral and dietary strategies for management of esophageal dysmotility. SLP will follow to assess diet tolerance and  continue education.  SLP Visit Diagnosis Dysphagia, pharyngoesophageal phase (R13.14)     Impact on safety and function Mild - Moderate aspiration risk Risk for inadequate nutrition/hydration     Prognosis 09/05/2020 Prognosis for Safe Diet Advancement Fair Barriers to Reach Goals Severity of deficits Barriers/Prognosis Comment Significant esophageal dysmotility observed CHL IP DIET RECOMMENDATION 09/05/2020 SLP Diet Recommendations Dysphagia 3 (Mech soft) solids;Thin liquid Liquid Administration via Cup;Straw Medication Administration Whole meds with liquid Compensations Minimize environmental distractions Slow rate Small sips/bites Postural Changes Seated upright at 90 degrees Remain semi-upright after after meals    CHL IP OTHER RECOMMENDATIONS 09/05/2020   Oral Care Recommendations Oral care BID            CHL IP ORAL PHASE 09/05/2020 Oral Phase WFL    CHL IP PHARYNGEAL PHASE 09/05/2020 Pharyngeal Phase WFL    CHL IP CERVICAL ESOPHAGEAL PHASE 09/05/2020 Cervical Esophageal Phase Impaired   Celia B. Quentin Ore, [facility], [place] Speech Language Pathologist Office: [phone] Pager: [phone] Shonna Chock 09/05/2020, 12:52 PM              ECHOCARDIOGRAM COMPLETE  Result Date: 09/08/2020    ECHOCARDIOGRAM REPORT   Patient Name:   LOVIE ZARLING [facility] Date of Exam: 09/08/2020 Medical Rec #:  [id number]        Height:       62.0 in Accession #:    [id number]       Weight:       106.3 lb Date of Birth:  20-Apr-1951        BSA:          1.461 m Patient Age:    4 years         BP:           115/69 mmHg Patient Gender: F                HR:           57 bpm. Exam Location:  Inpatient Procedure: 2D Echo Indications:    CHF 428  History:        Patient has no prior history of Echocardiogram examinations.                 Risk Factors:Current Smoker and Hypertension.  Sonographer:    Jannett Celestine RDCS (AE) Referring Phys: [id number] Kayleen Memos  Sonographer Comments: Suboptimal parasternal window. very limited mobility.  unable to turn on left side. IMPRESSIONS  1. Left ventricular ejection fraction, by estimation, is 55%. The left ventricle has normal function. The left ventricle demonstrates regional wall motion abnormalities with basal inferior and basal to mid inferolateral akinesis. Left ventricular diastolic parameters are consistent with Grade I diastolic dysfunction (impaired relaxation).  2. Right ventricular systolic function is normal. The right ventricular size is normal. Tricuspid regurgitation signal is inadequate for assessing PA pressure.  3. Left atrial size was moderately dilated.  4. The mitral valve is normal in structure. Trivial mitral valve regurgitation. No evidence of mitral stenosis. Moderate mitral annular calcification.  5. The aortic valve was not well visualized. Aortic valve regurgitation is not visualized. No aortic stenosis is present.  6. The inferior vena cava is normal in size with greater than 50% respiratory variability, suggesting right atrial pressure of 3 mmHg.  7. Technically difficult study with  poor acoustic windows. FINDINGS  Left Ventricle: Left ventricular ejection fraction, by estimation, is 55%. The left ventricle has normal function. The left ventricle demonstrates regional wall motion abnormalities. The left ventricular internal cavity size was normal in size. There is  no left ventricular hypertrophy. Left ventricular diastolic parameters are consistent with Grade I diastolic dysfunction (impaired relaxation). Right Ventricle: The right ventricular size is normal. No increase in right ventricular wall thickness. Right ventricular systolic function is normal. Tricuspid regurgitation signal is inadequate for assessing PA pressure. Left Atrium: Left atrial size was moderately dilated. Right Atrium: Right atrial size was normal in size. Pericardium: There is no evidence of pericardial effusion. Mitral Valve: The mitral valve is normal in structure. Moderate mitral annular  calcification. Trivial mitral valve regurgitation. No evidence of mitral valve stenosis. Tricuspid Valve: The tricuspid valve is normal in structure. Tricuspid valve regurgitation is not demonstrated. Aortic Valve: The aortic valve was not well visualized. Aortic valve regurgitation is not visualized. No aortic stenosis is present. Pulmonic Valve: The pulmonic valve was not well visualized. Pulmonic valve regurgitation is not visualized. Aorta: The aortic root was not well visualized. Venous: The inferior vena cava is normal in size with greater than 50% respiratory variability, suggesting right atrial pressure of 3 mmHg. IAS/Shunts: No atrial level shunt detected by color flow Doppler.  LEFT VENTRICLE PLAX 2D LVOT diam:     1.70 cm  Diastology LV SV:         51       LV e' medial:    5.33 cm/s LV SV Index:   35       LV E/e' medial:  13.9 LVOT Area:     2.27 cm LV e' lateral:   5.77 cm/s                         LV E/e' lateral: 12.8  RIGHT VENTRICLE RV S prime:     16.10 cm/s TAPSE (M-mode): 1.7 cm LEFT ATRIUM             Index LA Vol (A2C):   50.0 ml 34.22 ml/m LA Vol (A4C):   82.9 ml 56.74 ml/m LA Biplane Vol: 63.8 ml 43.66 ml/m  AORTIC VALVE LVOT Vmax:   143.00 cm/s LVOT Vmean:  82.700 cm/s LVOT VTI:    0.226 m MITRAL VALVE MV Area (PHT): 3.68 cm    SHUNTS MV Decel Time: 206 msec    Systemic VTI:  0.23 m MV E velocity: 74.10 cm/s  Systemic Diam: 1.70 cm Loralie Champagne MD Electronically signed by Loralie Champagne MD Signature Date/Time: 09/08/2020/5:25:22 PM    Final    ECHOCARDIOGRAM LIMITED  Result Date: 09/11/2020    ECHOCARDIOGRAM LIMITED REPORT   Patient Name:   NEJLA REASOR [facility] Date of Exam: 09/11/2020 Medical Rec #:  [id number]        Height:       62.0 in Accession #:    [id number]       Weight:       101.0 lb Date of Birth:  May 06, 1951        BSA:          1.430 m Patient Age:    46 years         BP:           121/71 mmHg Patient Gender: F                HR:  85 bpm. Exam Location:   Inpatient Procedure: Limited Echo and Intracardiac Opacification Agent Indications:    CHF- Acute Diastolic Y24.82; Needs Definity to rule out focal                 wall motion abnormalities.  History:        Patient has prior history of Echocardiogram examinations, most                 recent 09/08/2020. Signs/Symptoms:Murmur; Risk                 Factors:Hypertension.  Sonographer:    Mikki Santee RDCS (AE) Referring Phys: [id number] TRACI R TURNER IMPRESSIONS  1. Left ventricular ejection fraction, by estimation, is 60 to 65%. The left ventricle has normal function. There is akinesis of the left ventricular, basal inferior wall. The inferolateral wall appears normal. FINDINGS  Left Ventricle: Left ventricular ejection fraction, by estimation, is 60 to 65%. The left ventricle has normal function. Definity contrast agent was given IV to delineate the left ventricular endocardial borders. Fransico Him MD Electronically signed by Fransico Him MD Signature Date/Time: 09/11/2020/2:23:01 PM    Final    [place] EKG SITE RITE  Result Date: 09/06/2020 If Site Rite image not attached, placement could not be confirmed due to current cardiac rhythm.  US THORACENTESIS ASP PLEURAL SPACE W/IMG GUIDE  Result Date: 09/07/2020 INDICATION: Patient recently admitted for acute respiratory failure with hypoxia due to suspected aspiration pneumonia versus community acquired pneumonia. New onset bilateral pleural effusions. Interventional radiology asked to perform a therapeutic and diagnostic thoracentesis. EXAM: ULTRASOUND GUIDED THORACENTESIS MEDICATIONS: 1% lidocaine 10 mL COMPLICATIONS: None immediate. PROCEDURE: An ultrasound guided thoracentesis was thoroughly discussed with the patient and questions answered. The benefits, risks, alternatives and complications were also discussed. The patient understands and wishes to proceed with the procedure. Written consent was obtained. Ultrasound was performed to localize and mark an  adequate pocket of fluid in the right chest. The area was then prepped and draped in the normal sterile fashion. 1% Lidocaine was used for local anesthesia. Under ultrasound guidance a 6 Fr Safe-T-Centesis catheter was introduced. Thoracentesis was performed. The catheter was removed and a dressing applied. FINDINGS: A total of approximately 550 mL of clear yellow fluid was removed. Samples were sent to the laboratory as requested by the clinical team. IMPRESSION: Successful ultrasound guided right thoracentesis yielding 550 mL of pleural fluid. Read by: Soyla Dryer, NP Electronically Signed   By: Jacqulynn Cadet M.D.   On: 09/07/2020 16:42    Assessment/Plan ***   Family/ staff Communication: ***  Labs/tests ordered:  ***  Goals of care:  Discharge.   Durenda Age, DNP, MSN, FNP-BC [facility] [phone] (Monday-Friday 8:00 a.m. - 5:00 p.m.) [phone] (after hours)

## 2020-10-04 ENCOUNTER — Encounter: Payer: Self-pay | Admitting: General Practice

## 2020-10-04 ENCOUNTER — Encounter: Payer: Self-pay | Admitting: Adult Health

## 2020-10-04 MED ORDER — FAMOTIDINE 20 MG PO TABS: 20.0000 mg | ORAL_TABLET | Freq: Every day | ORAL | 0 refills | Status: AC

## 2020-10-04 MED ORDER — PANTOPRAZOLE SODIUM 40 MG PO TBEC: 40.0000 mg | DELAYED_RELEASE_TABLET | Freq: Two times a day (BID) | ORAL | 0 refills | Status: AC

## 2020-10-04 MED ORDER — HYDROXYZINE HCL 25 MG PO TABS: 25.0000 mg | ORAL_TABLET | Freq: Three times a day (TID) | ORAL | 0 refills | Status: AC | PRN

## 2020-10-04 MED ORDER — FUROSEMIDE 20 MG PO TABS: 20.0000 mg | ORAL_TABLET | Freq: Every day | ORAL | 0 refills | Status: AC

## 2020-10-04 MED ORDER — TRAMADOL HCL 50 MG PO TABS: 50.0000 mg | ORAL_TABLET | Freq: Three times a day (TID) | ORAL | 0 refills | Status: AC | PRN

## 2020-10-04 MED ORDER — POTASSIUM CHLORIDE 20 MEQ/15ML (10%) PO SOLN: 20.0000 meq | Freq: Every day | ORAL | 0 refills | Status: AC

## 2020-10-04 MED ORDER — NICOTINE 21 MG/24HR TD PT24: 21.0000 mg | MEDICATED_PATCH | Freq: Every day | TRANSDERMAL | 0 refills | Status: DC

## 2020-10-04 MED ORDER — METOPROLOL TARTRATE 25 MG PO TABS: 25.0000 mg | ORAL_TABLET | Freq: Two times a day (BID) | ORAL | 0 refills | Status: AC

## 2020-10-04 MED ORDER — GABAPENTIN 100 MG PO CAPS: 100.0000 mg | ORAL_CAPSULE | Freq: Two times a day (BID) | ORAL | 0 refills | Status: AC

## 2020-10-04 NOTE — Progress Notes (Signed)
This encounter was created in error - please disregard.

## 2020-10-13 ENCOUNTER — Other Ambulatory Visit: Payer: Self-pay

## 2020-10-13 ENCOUNTER — Emergency Department (HOSPITAL_COMMUNITY): Payer: Medicare Other

## 2020-10-13 ENCOUNTER — Encounter (HOSPITAL_COMMUNITY): Payer: Self-pay | Admitting: Internal Medicine

## 2020-10-13 ENCOUNTER — Observation Stay (HOSPITAL_COMMUNITY)
Admission: EM | Admit: 2020-10-13 | Discharge: 2020-10-15 | Disposition: A | Discharge status: Home / Self Care | Payer: Medicare Other | Attending: Internal Medicine | Admitting: Internal Medicine

## 2020-10-13 DIAGNOSIS — I10 Essential (primary) hypertension: Secondary | ICD-10-CM

## 2020-10-13 DIAGNOSIS — Z79899 Other long term (current) drug therapy: Secondary | ICD-10-CM | POA: Diagnosis not present

## 2020-10-13 DIAGNOSIS — Z96611 Presence of right artificial shoulder joint: Secondary | ICD-10-CM | POA: Diagnosis not present

## 2020-10-13 DIAGNOSIS — K219 Gastro-esophageal reflux disease without esophagitis: Secondary | ICD-10-CM | POA: Diagnosis present

## 2020-10-13 DIAGNOSIS — I509 Heart failure, unspecified: Secondary | ICD-10-CM | POA: Diagnosis not present

## 2020-10-13 DIAGNOSIS — I110 Hypertensive heart disease with heart failure: Secondary | ICD-10-CM | POA: Insufficient documentation

## 2020-10-13 DIAGNOSIS — W541XXA Struck by dog, initial encounter: Secondary | ICD-10-CM | POA: Insufficient documentation

## 2020-10-13 DIAGNOSIS — S79912A Unspecified injury of left hip, initial encounter: Secondary | ICD-10-CM | POA: Diagnosis present

## 2020-10-13 DIAGNOSIS — R262 Difficulty in walking, not elsewhere classified: Secondary | ICD-10-CM

## 2020-10-13 DIAGNOSIS — E785 Hyperlipidemia, unspecified: Secondary | ICD-10-CM | POA: Diagnosis present

## 2020-10-13 DIAGNOSIS — Z20822 Contact with and (suspected) exposure to covid-19: Secondary | ICD-10-CM | POA: Insufficient documentation

## 2020-10-13 DIAGNOSIS — F17210 Nicotine dependence, cigarettes, uncomplicated: Secondary | ICD-10-CM | POA: Insufficient documentation

## 2020-10-13 DIAGNOSIS — S3210XA Unspecified fracture of sacrum, initial encounter for closed fracture: Secondary | ICD-10-CM

## 2020-10-13 DIAGNOSIS — W19XXXA Unspecified fall, initial encounter: Secondary | ICD-10-CM

## 2020-10-13 DIAGNOSIS — S322XXA Fracture of coccyx, initial encounter for closed fracture: Secondary | ICD-10-CM | POA: Diagnosis not present

## 2020-10-13 DIAGNOSIS — E871 Hypo-osmolality and hyponatremia: Secondary | ICD-10-CM

## 2020-10-13 DIAGNOSIS — Z720 Tobacco use: Secondary | ICD-10-CM | POA: Diagnosis present

## 2020-10-13 LAB — BASIC METABOLIC PANEL
Anion gap: 10 (ref 5–15)
BUN: 15 mg/dL (ref 8–23)
CO2: 27 mmol/L (ref 22–32)
Calcium: 9 mg/dL (ref 8.9–10.3)
Chloride: 93 mmol/L — ABNORMAL LOW (ref 98–111)
Creatinine, Ser: 1.04 mg/dL — ABNORMAL HIGH (ref 0.44–1.00)
GFR, Estimated: 59 mL/min — ABNORMAL LOW (ref >60)
Glucose, Bld: 116 mg/dL — ABNORMAL HIGH (ref 70–99)
Potassium: 4.9 mmol/L (ref 3.5–5.1)
Sodium: 130 mmol/L — ABNORMAL LOW (ref 135–145)

## 2020-10-13 LAB — CBC WITH DIFFERENTIAL/PLATELET
Abs Immature Granulocytes: 0.06 10*3/uL (ref 0.00–0.07)
Basophils Absolute: 0.1 10*3/uL (ref 0.0–0.1)
Basophils Relative: 1 %
Eosinophils Absolute: 0 10*3/uL (ref 0.0–0.5)
Eosinophils Relative: 0 %
HCT: 27.2 % — ABNORMAL LOW (ref 36.0–46.0)
Hemoglobin: 8.7 g/dL — ABNORMAL LOW (ref 12.0–15.0)
Immature Granulocytes: 0 %
Lymphocytes Relative: 19 %
Lymphs Abs: 3 10*3/uL (ref 0.7–4.0)
MCH: 26.7 pg (ref 26.0–34.0)
MCHC: 32 g/dL (ref 30.0–36.0)
MCV: 83.4 fL (ref 80.0–100.0)
Monocytes Absolute: 1.3 10*3/uL — ABNORMAL HIGH (ref 0.1–1.0)
Monocytes Relative: 8 %
Neutro Abs: 11.3 10*3/uL — ABNORMAL HIGH (ref 1.7–7.7)
Neutrophils Relative %: 72 %
Platelets: 529 10*3/uL — ABNORMAL HIGH (ref 150–400)
RBC: 3.26 MIL/uL — ABNORMAL LOW (ref 3.87–5.11)
RDW: 16 % — ABNORMAL HIGH (ref 11.5–15.5)
WBC: 15.8 10*3/uL — ABNORMAL HIGH (ref 4.0–10.5)
nRBC: 0 % (ref 0.0–0.2)

## 2020-10-13 LAB — RESP PANEL BY RT-PCR (FLU A&B, COVID) ARPGX2
Influenza A by PCR: NEGATIVE
Influenza B by PCR: NEGATIVE
SARS Coronavirus 2 by RT PCR: NEGATIVE

## 2020-10-13 MED ORDER — FENTANYL CITRATE (PF) 100 MCG/2ML IJ SOLN: 25.0000 ug | INTRAMUSCULAR | Status: DC | PRN

## 2020-10-13 MED ORDER — LIDOCAINE 5 % EX PTCH
1.0000 | MEDICATED_PATCH | CUTANEOUS | Status: DC
  Administered 2020-10-13 – 2020-10-14 (×2): 1 via TRANSDERMAL
  Filled 2020-10-13 (×2): qty 1

## 2020-10-13 MED ORDER — ONDANSETRON HCL 4 MG/2ML IJ SOLN
4.0000 mg | Freq: Four times a day (QID) | INTRAMUSCULAR | Status: DC | PRN
  Administered 2020-10-14 – 2020-10-15 (×4): 4 mg via INTRAVENOUS
  Filled 2020-10-13 (×4): qty 2

## 2020-10-13 MED ORDER — POLYETHYLENE GLYCOL 3350 17 G PO PACK
17.0000 g | PACK | Freq: Every day | ORAL | Status: DC
  Administered 2020-10-13 – 2020-10-14 (×2): 17 g via ORAL
  Filled 2020-10-13 (×3): qty 1

## 2020-10-13 MED ORDER — HYDROCODONE-ACETAMINOPHEN 5-325 MG PO TABS
1.0000 | ORAL_TABLET | ORAL | Status: DC | PRN
  Administered 2020-10-13 – 2020-10-14 (×4): 1 via ORAL
  Filled 2020-10-13 (×4): qty 1

## 2020-10-13 MED ORDER — NICOTINE 21 MG/24HR TD PT24: 21.0000 mg | MEDICATED_PATCH | Freq: Every day | TRANSDERMAL | Status: DC

## 2020-10-13 MED ORDER — METOPROLOL TARTRATE 25 MG PO TABS
25.0000 mg | ORAL_TABLET | Freq: Two times a day (BID) | ORAL | Status: DC
  Administered 2020-10-13 – 2020-10-15 (×4): 25 mg via ORAL
  Filled 2020-10-13 (×4): qty 1

## 2020-10-13 MED ORDER — SALINE SPRAY 0.65 % NA SOLN
2.0000 | NASAL | Status: DC | PRN
  Filled 2020-10-13: qty 44

## 2020-10-13 MED ORDER — ONDANSETRON 4 MG PO TBDP
4.0000 mg | ORAL_TABLET | Freq: Once | ORAL | Status: AC
  Administered 2020-10-13: 4 mg via ORAL
  Filled 2020-10-13: qty 1

## 2020-10-13 MED ORDER — ACETAMINOPHEN 325 MG PO TABS
650.0000 mg | ORAL_TABLET | Freq: Once | ORAL | Status: AC
  Administered 2020-10-13: 650 mg via ORAL
  Filled 2020-10-13: qty 2

## 2020-10-13 MED ORDER — DOCUSATE SODIUM 100 MG PO CAPS
100.0000 mg | ORAL_CAPSULE | Freq: Two times a day (BID) | ORAL | Status: DC
  Administered 2020-10-13 – 2020-10-15 (×4): 100 mg via ORAL
  Filled 2020-10-13 (×4): qty 1

## 2020-10-13 MED ORDER — ACETAMINOPHEN 325 MG PO TABS: 650.0000 mg | ORAL_TABLET | Freq: Four times a day (QID) | ORAL | Status: DC | PRN

## 2020-10-13 MED ORDER — HYDROCODONE-ACETAMINOPHEN 5-325 MG PO TABS
1.0000 | ORAL_TABLET | ORAL | Status: DC | PRN
  Administered 2020-10-13: 1 via ORAL
  Filled 2020-10-13: qty 1

## 2020-10-13 MED ORDER — ENOXAPARIN SODIUM 30 MG/0.3ML ~~LOC~~ SOLN
30.0000 mg | SUBCUTANEOUS | Status: DC
  Administered 2020-10-13 – 2020-10-14 (×2): 30 mg via SUBCUTANEOUS
  Filled 2020-10-13 (×2): qty 0.3

## 2020-10-13 MED ORDER — FENTANYL CITRATE (PF) 100 MCG/2ML IJ SOLN
25.0000 ug | INTRAMUSCULAR | Status: DC | PRN
  Administered 2020-10-13 (×2): 25 ug via INTRAVENOUS
  Filled 2020-10-13 (×2): qty 2

## 2020-10-13 MED ORDER — ALUM & MAG HYDROXIDE-SIMETH 200-200-20 MG/5ML PO SUSP
30.0000 mL | ORAL | Status: DC | PRN
  Administered 2020-10-14: 30 mL via ORAL
  Filled 2020-10-13 (×3): qty 30

## 2020-10-13 MED ORDER — PANTOPRAZOLE SODIUM 40 MG PO TBEC
40.0000 mg | DELAYED_RELEASE_TABLET | Freq: Two times a day (BID) | ORAL | Status: DC
  Administered 2020-10-13 – 2020-10-15 (×4): 40 mg via ORAL
  Filled 2020-10-13 (×4): qty 1

## 2020-10-13 MED ORDER — ONDANSETRON HCL 4 MG PO TABS
4.0000 mg | ORAL_TABLET | Freq: Four times a day (QID) | ORAL | Status: DC | PRN
  Administered 2020-10-14: 4 mg via ORAL
  Filled 2020-10-13: qty 1

## 2020-10-13 MED ORDER — FUROSEMIDE 20 MG PO TABS
20.0000 mg | ORAL_TABLET | Freq: Every day | ORAL | Status: DC
  Administered 2020-10-13 – 2020-10-14 (×2): 20 mg via ORAL
  Filled 2020-10-13 (×3): qty 1

## 2020-10-13 MED ORDER — TRAMADOL HCL 50 MG PO TABS
50.0000 mg | ORAL_TABLET | Freq: Three times a day (TID) | ORAL | Status: DC | PRN
  Administered 2020-10-13 – 2020-10-14 (×3): 50 mg via ORAL
  Filled 2020-10-13 (×3): qty 1

## 2020-10-13 MED ORDER — FAMOTIDINE 20 MG PO TABS
20.0000 mg | ORAL_TABLET | Freq: Every day | ORAL | Status: DC
  Administered 2020-10-13 – 2020-10-15 (×3): 20 mg via ORAL
  Filled 2020-10-13 (×3): qty 1

## 2020-10-13 NOTE — H&P (Signed)
History and Physical    [name] HEN:[id number] DOB: 1950/12/03 DOA: 10/13/2020  PCP: Nicholes Rough, PA-C  Patient coming from: Home  I have personally briefly reviewed patient's old medical records available.   Chief Complaint: Left hip pain  HPI: [name] is a 69 y.o. female with medical history significant of multiple medical issues and recent multiple hospitalizations.  She has history of GERD, hypertension, SIADH and chronic hyponatremia who presents to the emergency room with left hip pain and left shoulder pain when she fell on her back after she was bumped by a [place].  Patient is independent.  She is just finished her rehab and went home 10 days ago.  She is already back to independent working.  Yesterday, she was in her apartment and of very big [place] on her and she fell on her back.  She had some pain went to her apartment.  She was not able to walk today so came to the ER.  Complains of severe sharp pain on her left posterior hip, some discomfort on the left shoulder.  No radiation of pain.  Hip pain is about 9 out of 10 on mobility.  Left shoulder pain is about 4 out of 10.  ED Course: Hemodynamically stable.  Skeletal survey negative.  CT scan of the pelvis showed nondisplaced left sacral alla fracture.  Given multiple rounds of medications.  Patient unable to ambulate so admission requested.  Review of Systems: all systems are reviewed and pertinent positive as per HPI otherwise rest are negative.    Past Medical History:  Diagnosis Date  . Abdominal pain, epigastric   . Acid reflux    Pt was born with acute acid  . AKI (acute kidney injury) ([place]) 09/15/2020  . Anxiety   . GERD (gastroesophageal reflux disease)   . Heart murmur   . Hiatal hernia   . Hypertension   . Hyponatremia 02/10/2020  . Schatzki's ring   . Ulcer    8 years ago    Past Surgical History:  Procedure Laterality Date  . ABDOMINAL HYSTERECTOMY     complete  for fibroids  . KNEE ARTHROSCOPY     right knee  . shoulder repalcement     right  . TONSILLECTOMY AND ADENOIDECTOMY       reports that she has been smoking cigarettes. She has been smoking about 0.50 packs per day. She has never used smokeless tobacco. She reports that she does not drink alcohol and does not use drugs.  Allergies  Allergen Reactions  . Iodine Shortness Of Breath and Rash  . Thiazide-Type Diuretics Other (See Comments)    Severe hyponatremia July 2021  . Shellfish Allergy     Causes rash    Family History  Problem Relation Age of Onset  . Prostate cancer Father   . Anxiety disorder Mother   . Colon cancer Maternal Grandmother      Prior to Admission medications   Medication Sig Start Date End Date Taking? Authorizing Provider  acetaminophen (TYLENOL) 325 MG tablet Take 2 tablets (650 mg total) by mouth every 6 (six) hours as needed for mild pain, moderate pain, fever or headache. 09/14/20   Cherene Altes, MD  alum & mag hydroxide-simeth (MAALOX/MYLANTA) 200-200-20 MG/5ML suspension Take 30 mLs by mouth every 4 (four) hours as needed for indigestion or heartburn. 09/14/20   Cherene Altes, MD  docusate (COLACE) 50 MG/5ML liquid Take 10 mLs (100 mg total) by mouth  2 (two) times daily as needed for mild constipation. 09/14/20   Cherene Altes, MD  Ensure (ENSURE) Take 237 mLs by mouth 2 (two) times daily between meals.    [provider]  famotidine (PEPCID) 20 MG tablet Take 1 tablet (20 mg total) by mouth daily. 10/04/20   Medina-Vargas, Monina C, NP  furosemide (LASIX) 20 MG tablet Take 1 tablet (20 mg total) by mouth daily. 10/04/20   Medina-Vargas, Monina C, NP  gabapentin (NEURONTIN) 100 MG capsule Take 1 capsule (100 mg total) by mouth 2 (two) times daily. 10/04/20   Medina-Vargas, Monina C, NP  hydrOXYzine (ATARAX/VISTARIL) 25 MG tablet Take 1 tablet (25 mg total) by mouth every 8 (eight) hours as needed for up to 14 days. 10/04/20 10/18/20   Medina-Vargas, Monina C, NP  metoprolol tartrate (LOPRESSOR) 25 MG tablet Take 1 tablet (25 mg total) by mouth 2 (two) times daily. 10/04/20   Medina-Vargas, Monina C, NP  nicotine (NICODERM CQ - DOSED IN MG/24 HOURS) 21 mg/24hr patch Place 1 patch (21 mg total) onto the skin daily. 10/04/20   Medina-Vargas, Monina C, NP  Nutritional Supplements (NUTRITIONAL SUPPLEMENT PO) Take 1 each by mouth daily. Magic Cup    [provider]  pantoprazole (PROTONIX) 40 MG tablet Take 1 tablet (40 mg total) by mouth 2 (two) times daily. 10/04/20   Medina-Vargas, Monina C, NP  polyethylene glycol (MIRALAX / GLYCOLAX) 17 g packet Take 17 g by mouth daily as needed for moderate constipation. 09/14/20   Cherene Altes, MD  potassium chloride 20 MEQ/15ML (10%) SOLN Take 15 mLs (20 mEq total) by mouth daily. 10/04/20   Medina-Vargas, Monina C, NP  sodium chloride (OCEAN) 0.65 % nasal spray Place 2 sprays into the nose in the morning, at noon, and at bedtime.    [provider]  traMADol (ULTRAM) 50 MG tablet Take 1 tablet (50 mg total) by mouth every 8 (eight) hours as needed for severe pain. 10/04/20   Medina-Vargas, Jaymes Graff C, NP    Physical Exam: Vitals:   10/13/20 0909 10/13/20 1118 10/13/20 1321 10/13/20 1522  BP: (!) 152/96 (!) 149/83 (!) 139/91 126/90  Pulse: 88 90 89 91  Resp: 16 18 17 15   Temp: 98.5 F (36.9 C) 98.6 F (37 C) 98.4 F (36.9 C) 98.7 F (37.1 C)  TempSrc: Oral Oral Oral Oral  SpO2: 100% 99% 91% 99%    Constitutional: NAD, calm, comfortable Vitals:   10/13/20 0909 10/13/20 1118 10/13/20 1321 10/13/20 1522  BP: (!) 152/96 (!) 149/83 (!) 139/91 126/90  Pulse: 88 90 89 91  Resp: 16 18 17 15   Temp: 98.5 F (36.9 C) 98.6 F (37 C) 98.4 F (36.9 C) 98.7 F (37.1 C)  TempSrc: Oral Oral Oral Oral  SpO2: 100% 99% 91% 99%   Eyes: PERRL, lids and conjunctivae normal ENMT: Mucous membranes are moist. Posterior pharynx clear of any exudate or lesions.Normal  dentition.  Neck: normal, supple, no masses, no thyromegaly Respiratory: clear to auscultation bilaterally, no wheezing, no crackles. Normal respiratory effort. No accessory muscle use.  Cardiovascular: Regular rate and rhythm, no murmurs / rubs / gallops. No extremity edema. 2+ pedal pulses. No carotid bruits.  Abdomen: no tenderness, no masses palpated. No hepatosplenomegaly. Bowel sounds positive.  Musculoskeletal: no clubbing / cyanosis. No joint deformity upper and lower extremities. Good ROM, no contractures. Normal muscle tone.  Skin: no rashes, lesions, ulcers. No induration Neurologic: CN 2-12 grossly intact. Sensation intact, DTR normal. Strength  5/5 in all 4.  Psychiatric: Normal judgment and insight. Alert and oriented x 3. Normal mood.  No deformity or tenderness on the left shoulder. She has some palpable pain on her left posterior hip, no deformity.  Range of motion normal.    Labs on Admission: I have personally reviewed following labs and imaging studies  CBC: Recent Labs  Lab 10/13/20 1515  WBC 15.8*  NEUTROABS 11.3*  HGB 8.7*  HCT 27.2*  MCV 83.4  PLT 656*   Basic Metabolic Panel: Recent Labs  Lab 10/13/20 1515  NA 130*  K 4.9  CL 93*  CO2 27  GLUCOSE 116*  BUN 15  CREATININE 1.04*  CALCIUM 9.0   GFR: Estimated Creatinine Clearance: 33.1 mL/min (A) (by C-G formula based on SCr of 1.04 mg/dL (H)). Liver Function Tests: No results for input(s): AST, ALT, ALKPHOS, BILITOT, PROT, ALBUMIN in the last 168 hours. No results for input(s): LIPASE, AMYLASE in the last 168 hours. No results for input(s): AMMONIA in the last 168 hours. Coagulation Profile: No results for input(s): INR, PROTIME in the last 168 hours. Cardiac Enzymes: No results for input(s): CKTOTAL, CKMB, CKMBINDEX, TROPONINI in the last 168 hours. BNP (last 3 results) No results for input(s): PROBNP in the last 8760 hours. HbA1C: No results for input(s): HGBA1C in the last 72  hours. CBG: No results for input(s): GLUCAP in the last 168 hours. Lipid Profile: No results for input(s): CHOL, HDL, LDLCALC, TRIG, CHOLHDL, LDLDIRECT in the last 72 hours. Thyroid Function Tests: No results for input(s): TSH, T4TOTAL, FREET4, T3FREE, THYROIDAB in the last 72 hours. Anemia Panel: No results for input(s): VITAMINB12, FOLATE, FERRITIN, TIBC, IRON, RETICCTPCT in the last 72 hours. Urine analysis:    Component Value Date/Time   COLORURINE YELLOW 08/28/2020 2022   APPEARANCEUR HAZY (A) 08/28/2020 2022   LABSPEC 1.012 08/28/2020 2022   PHURINE 5.0 08/28/2020 2022   GLUCOSEU 50 (A) 08/28/2020 2022   HGBUR MODERATE (A) 08/28/2020 2022   BILIRUBINUR NEGATIVE 08/28/2020 2022   KETONESUR 5 (A) 08/28/2020 2022   PROTEINUR 30 (A) 08/28/2020 2022   UROBILINOGEN 0.2 05/25/2011 1337   NITRITE NEGATIVE 08/28/2020 2022   LEUKOCYTESUR TRACE (A) 08/28/2020 2022    Radiological Exams on Admission: DG Lumbar Spine Complete  Result Date: 10/13/2020 CLINICAL DATA:  Low back pain after fall last night. EXAM: LUMBAR SPINE - COMPLETE 4+ VIEW COMPARISON:  None. FINDINGS: Diffuse osteopenia is noted. No fracture or spondylolisthesis is noted. Mild degenerative disc disease is noted at L1-2 and L2-3. Moderate degenerative disc disease is noted at L5-S1. IMPRESSION: Multilevel degenerative disc disease. No acute abnormality seen in the lumbar spine. Electronically Signed   By: Marijo Conception M.D.   On: 10/13/2020 12:03   DG Shoulder Left  Result Date: 10/13/2020 CLINICAL DATA:  69 year old female with fall EXAM: LEFT SHOULDER - 2+ VIEW COMPARISON:  None. FINDINGS: No acute displaced fracture. Glenohumeral joint appears congruent. Degenerative changes at the [facility] joint. IMPRESSION: Negative for acute bony abnormality Electronically Signed   By: Corrie Mckusick D.O.   On: 10/13/2020 12:01   DG Hip Unilat W or Wo Pelvis 2-3 Views Left  Result Date: 10/13/2020 CLINICAL DATA:  Left hip pain after  fall last night. EXAM: DG HIP (WITH OR WITHOUT PELVIS) 2-3V LEFT COMPARISON:  None. FINDINGS: There is no evidence of hip fracture or dislocation. There is no evidence of arthropathy or other focal bone abnormality. IMPRESSION: Negative. Electronically Signed   By: Jeneen Rinks  Murlean Caller M.D.   On: 10/13/2020 12:02    EKG: Independently reviewed.  Not done.  Assessment/Plan Principal Problem:   Sacrum and coccyx fracture (HCC) Active Problems:   Hypertension   GERD (gastroesophageal reflux disease)   Tobacco use   Hyperlipidemia   Hyponatremia   Ambulatory dysfunction     1.  Sacral alla fracture traumatic and closed/ambulatory dysfunction: Agree with monitoring given some improvement in symptoms. IV and oral opiates for pain relief, local lidocaine patch.  Laxatives to avoid constipation. Ambulate as tolerated.  Work with PT OT.  2.  SIADH: Sodium is fairly normal and at her baseline.  3.  Hypertension: Blood pressure stable.  Resume home medications.  4.  GERD: On PPI continue.  5.  Smoker: Quit 11 days ago.  Does not need a nicotine patch.  Congratulated.  6.  Anemia of chronic disease: She has chronic anemia.  Hemoglobin at baseline.  7.  Leukocytosis: Has baseline leukocytosis.  Cause unknown.  Probably stress related.  UA pending.  Asymptomatic.  DVT prophylaxis: Lovenox subcu Code Status: Full code.  She was DNR on previous admission and she does not want to know more. Family Communication: None at the bedside. Disposition Plan: Home with home health PT Consults called: None Admission status: Observation, MedSurg.   Barb Merino MD [facility] Pager [phone]

## 2020-10-13 NOTE — ED Provider Notes (Signed)
[place] DEPT Provider Note   CSN: [id number] Arrival date & time: 10/13/20  0900     History Chief Complaint  Patient presents with  . Hip Pain  . Shoulder Injury    Latayna Ritchie is a 69 y.o. female.  HPI Patient presents 1 day after a fall, now with inability to ambulate due to pain in her left hip.  Patient also has left shoulder pain. Patient was in her usual state of health until yesterday. She notes that she was bumped by a [place].  She fell to the ground, landing on her left hip. Since that time she has been nonambulatory, with severe sharp pain in her left posterior hip.  She does have some discomfort in her left shoulder, but no level pain in comparison to her left hip. She has no inability to use the left arm, where as she is unable to walk, cannot lift her left leg. No abdominal pain, chest pain, other complaints. No relief with tramadol.   Past Medical History:  Diagnosis Date  . Abdominal pain, epigastric   . Acid reflux    Pt was born with acute acid  . AKI (acute kidney injury) ([place]) 09/15/2020  . Anxiety   . GERD (gastroesophageal reflux disease)   . Heart murmur   . Hiatal hernia   . Hypertension   . Hyponatremia 02/10/2020  . Schatzki's ring   . Ulcer    8 years ago    Patient Active Problem List   Diagnosis Date Noted  . Neurocognitive deficits 09/20/2020  . AKI (acute kidney injury) ([place]) 09/15/2020  . CHF (congestive heart failure) ([place])   . Hiatal hernia   . Malnutrition of severe degree 08/29/2020  . Respiratory failure ([place])   . Community acquired pneumonia of right lower lobe of lung   . Somnolence   . Failure to thrive in adult   . Generalized weakness 05/19/2020  . Hypokalemia 05/19/2020  . Hyponatremia 02/03/2020  . Ambulatory dysfunction   . Prediabetes 03/23/2013  . Hyperlipidemia 03/23/2013  . Colon cancer screening 11/18/2012  . Hypertension 09/16/2012  . GERD (gastroesophageal  reflux disease) 09/16/2012  . Tobacco use 09/16/2012    Past Surgical History:  Procedure Laterality Date  . ABDOMINAL HYSTERECTOMY     complete for fibroids  . KNEE ARTHROSCOPY     right knee  . shoulder repalcement     right  . TONSILLECTOMY AND ADENOIDECTOMY       OB History   No obstetric history on file.     Family History  Problem Relation Age of Onset  . Prostate cancer Father   . Anxiety disorder Mother   . Colon cancer Maternal Grandmother     Social History   Tobacco Use  . Smoking status: Current Every Day Smoker    Packs/day: 0.50    Types: Cigarettes  . Smokeless tobacco: Never Used  . Tobacco comment: almost a pack a day;   Vaping Use  . Vaping Use: Never used  Substance Use Topics  . Alcohol use: No    Alcohol/week: 0.0 standard drinks  . Drug use: No    Home Medications Prior to Admission medications   Medication Sig Start Date End Date Taking? Authorizing Provider  acetaminophen (TYLENOL) 325 MG tablet Take 2 tablets (650 mg total) by mouth every 6 (six) hours as needed for mild pain, moderate pain, fever or headache. 09/14/20   Cherene Altes, MD  alum & Iris Pert  hydroxide-simeth (MAALOX/MYLANTA) 200-200-20 MG/5ML suspension Take 30 mLs by mouth every 4 (four) hours as needed for indigestion or heartburn. 09/14/20   Cherene Altes, MD  docusate (COLACE) 50 MG/5ML liquid Take 10 mLs (100 mg total) by mouth 2 (two) times daily as needed for mild constipation. 09/14/20   Cherene Altes, MD  Ensure (ENSURE) Take 237 mLs by mouth 2 (two) times daily between meals.    [provider]  famotidine (PEPCID) 20 MG tablet Take 1 tablet (20 mg total) by mouth daily. 10/04/20   Medina-Vargas, Monina C, NP  furosemide (LASIX) 20 MG tablet Take 1 tablet (20 mg total) by mouth daily. 10/04/20   Medina-Vargas, Monina C, NP  gabapentin (NEURONTIN) 100 MG capsule Take 1 capsule (100 mg total) by mouth 2 (two) times daily. 10/04/20   Medina-Vargas,  Monina C, NP  hydrOXYzine (ATARAX/VISTARIL) 25 MG tablet Take 1 tablet (25 mg total) by mouth every 8 (eight) hours as needed for up to 14 days. 10/04/20 10/18/20  Medina-Vargas, Monina C, NP  metoprolol tartrate (LOPRESSOR) 25 MG tablet Take 1 tablet (25 mg total) by mouth 2 (two) times daily. 10/04/20   Medina-Vargas, Monina C, NP  nicotine (NICODERM CQ - DOSED IN MG/24 HOURS) 21 mg/24hr patch Place 1 patch (21 mg total) onto the skin daily. 10/04/20   Medina-Vargas, Monina C, NP  Nutritional Supplements (NUTRITIONAL SUPPLEMENT PO) Take 1 each by mouth daily. Magic Cup    [provider]  pantoprazole (PROTONIX) 40 MG tablet Take 1 tablet (40 mg total) by mouth 2 (two) times daily. 10/04/20   Medina-Vargas, Monina C, NP  polyethylene glycol (MIRALAX / GLYCOLAX) 17 g packet Take 17 g by mouth daily as needed for moderate constipation. 09/14/20   Cherene Altes, MD  potassium chloride 20 MEQ/15ML (10%) SOLN Take 15 mLs (20 mEq total) by mouth daily. 10/04/20   Medina-Vargas, Monina C, NP  sodium chloride (OCEAN) 0.65 % nasal spray Place 2 sprays into the nose in the morning, at noon, and at bedtime.    [provider]  traMADol (ULTRAM) 50 MG tablet Take 1 tablet (50 mg total) by mouth every 8 (eight) hours as needed for severe pain. 10/04/20   Medina-Vargas, Monina C, NP    Allergies    Iodine, Thiazide-type diuretics, and Shellfish allergy  Review of Systems   Review of Systems  Constitutional:       Per HPI, otherwise negative  HENT:       Per HPI, otherwise negative  Respiratory:       Per HPI, otherwise negative  Cardiovascular:       Per HPI, otherwise negative  Gastrointestinal: Negative for vomiting.  Endocrine:       Negative aside from HPI  Genitourinary:       Neg aside from HPI   Musculoskeletal:       Per HPI, otherwise negative  Skin: Negative.   Neurological: Negative for syncope.    Physical Exam Updated Vital Signs BP (!) 149/83 (BP Location:  Right Arm)   Pulse 90   Temp 98.6 F (37 C) (Oral)   Resp 18   SpO2 99%   Physical Exam Vitals and nursing note reviewed.  Constitutional:      General: She is not in acute distress.    Appearance: She is well-developed.  HENT:     Head: Normocephalic and atraumatic.  Eyes:     Conjunctiva/sclera: Conjunctivae normal.  Cardiovascular:     Rate  and Rhythm: Normal rate and regular rhythm.  Pulmonary:     Effort: Pulmonary effort is normal. No respiratory distress.     Breath sounds: Normal breath sounds. No stridor.  Abdominal:     General: There is no distension.  Musculoskeletal:       Arms:       Legs:  Skin:    General: Skin is warm and dry.  Neurological:     Mental Status: She is alert and oriented to person, place, and time.     Cranial Nerves: No cranial nerve deficit.      ED Results / Procedures / Treatments   Labs (all labs ordered are listed, but only abnormal results are displayed) Labs Reviewed  BASIC METABOLIC PANEL - Abnormal; Notable for the following components:      Result Value   Sodium 130 (*)    Chloride 93 (*)    Glucose, Bld 116 (*)    Creatinine, Ser 1.04 (*)    GFR, Estimated 59 (*)    All other components within normal limits  CBC WITH DIFFERENTIAL/PLATELET - Abnormal; Notable for the following components:   WBC 15.8 (*)    RBC 3.26 (*)    Hemoglobin 8.7 (*)    HCT 27.2 (*)    RDW 16.0 (*)    Platelets 529 (*)    Neutro Abs 11.3 (*)    Monocytes Absolute 1.3 (*)    All other components within normal limits  RESP PANEL BY RT-PCR (FLU A&B, COVID) [id number]     Radiology DG Lumbar Spine Complete  Result Date: 10/13/2020 CLINICAL DATA:  Low back pain after fall last night. EXAM: LUMBAR SPINE - COMPLETE 4+ VIEW COMPARISON:  None. FINDINGS: Diffuse osteopenia is noted. No fracture or spondylolisthesis is noted. Mild degenerative disc disease is noted at L1-2 and L2-3. Moderate degenerative disc disease is noted at L5-S1. IMPRESSION:  Multilevel degenerative disc disease. No acute abnormality seen in the lumbar spine. Electronically Signed   By: Marijo Conception M.D.   On: 10/13/2020 12:03   DG Shoulder Left  Result Date: 10/13/2020 CLINICAL DATA:  69 year old female with fall EXAM: LEFT SHOULDER - 2+ VIEW COMPARISON:  None. FINDINGS: No acute displaced fracture. Glenohumeral joint appears congruent. Degenerative changes at the [facility] joint. IMPRESSION: Negative for acute bony abnormality Electronically Signed   By: Corrie Mckusick D.O.   On: 10/13/2020 12:01   DG Hip Unilat W or Wo Pelvis 2-3 Views Left  Result Date: 10/13/2020 CLINICAL DATA:  Left hip pain after fall last night. EXAM: DG HIP (WITH OR WITHOUT PELVIS) 2-3V LEFT COMPARISON:  None. FINDINGS: There is no evidence of hip fracture or dislocation. There is no evidence of arthropathy or other focal bone abnormality. IMPRESSION: Negative. Electronically Signed   By: Marijo Conception M.D.   On: 10/13/2020 12:02    Procedures Procedures (including critical care time)  Medications Ordered in ED Medications  ondansetron (ZOFRAN-ODT) disintegrating tablet 4 mg (4 mg Oral Given 10/13/20 1116)  acetaminophen (TYLENOL) tablet 650 mg (650 mg Oral Given 10/13/20 1116)    ED Course  I have reviewed the triage vital signs and the nursing notes.  Pertinent labs & imaging results that were available during my care of the patient were reviewed by me and considered in my medical decision making (see chart for details).   Update: Patient accompanied by her brother.  I discussed all findings, including CT evidence of sacrum fracture. Patient remains nonambulatory, has switched  from multiple doses of Norco, now receiving fentanyl for pain control. Labs pending.  Labs reviewed, notable for leukocytosis which is not unusual for this patient, she denies other complaints. Other labs generally reassuring.  Patient appropriate for admission for ongoing therapy for her sacrum fracture  sustained during fall that occurred yesterday.  MDM Rules/Calculators/A&P MDM Number of Diagnoses or Management Options Closed fracture of sacrum and coccyx, initial encounter [facility]): new, needed workup Fall, initial encounter: new, needed workup   Amount and/or Complexity of Data Reviewed Clinical lab tests: reviewed Tests in the radiology section of CPT: reviewed Tests in the medicine section of CPT: reviewed Decide to obtain previous medical records or to obtain history from someone other than the patient: yes Obtain history from someone other than the patient: yes Review and summarize past medical records: yes Discuss the patient with other providers: yes Independent visualization of images, tracings, or specimens: yes  Risk of Complications, Morbidity, and/or Mortality Presenting problems: high Diagnostic procedures: high Management options: high  Critical Care Total time providing critical care: < 30 minutes  Patient Progress Patient progress: stable  Final Clinical Impression(s) / ED Diagnoses Final diagnoses:  Fall, initial encounter  Closed fracture of sacrum and coccyx, initial encounter [facility])     Carmin Muskrat, MD 10/13/20 1648

## 2020-10-13 NOTE — ED Triage Notes (Signed)
Emergency Medicine Provider Triage Evaluation Note  [name] [facility] , a 69 y.o. female  was evaluated in triage.  Pt complains of pain to the left shoulder and left hip following a fall.  Patient states she was pushed backwards by a dog jumping up on her, landed on her left hip primarily.   Review of Systems  Positive: Left shoulder and hip pain; nausea Negative: Head injury, numbness, weakness, chest pain, shortness of breath, abdominal pain  Physical Exam  BP (!) 152/96 (BP Location: Right Arm)   Pulse 88   Temp 98.5 F (36.9 C) (Oral)   Resp 16   SpO2 100%  Gen:   Awake, no distress  HEENT:  Atraumatic Resp:  Normal effort Cardiac:  Normal rate Abd:   Nondistended, nontender MSK:   Tenderness in the left posterior hip.  No midline spinal tenderness.  No tenderness, deformity, swelling to the left shoulder.  Full range of motion left shoulder, though painful. Neuro:  Sensation light touch grossly intact in the extremities. Motor function intact in the extremities.  Medical Decision Making  Medically screening exam initiated at 11:01 AM.  Appropriate orders placed.  [name] was informed that the remainder of the evaluation will be completed by another provider, this initial triage assessment does not replace that evaluation, and the importance of remaining in the ED until their evaluation is complete.  Clinical Impression   Imaging studies were ordered, appropriate to the time of evaluation.   Lorayne Bender, PA-C 10/13/20 1113

## 2020-10-13 NOTE — ED Triage Notes (Signed)
Pt reports falling last night; pt c/o left hip and left shoulder pain at this time.

## 2020-10-14 DIAGNOSIS — S322XXA Fracture of coccyx, initial encounter for closed fracture: Secondary | ICD-10-CM | POA: Diagnosis not present

## 2020-10-14 DIAGNOSIS — R262 Difficulty in walking, not elsewhere classified: Secondary | ICD-10-CM | POA: Diagnosis not present

## 2020-10-14 DIAGNOSIS — S3210XA Unspecified fracture of sacrum, initial encounter for closed fracture: Secondary | ICD-10-CM | POA: Diagnosis not present

## 2020-10-14 LAB — GLUCOSE, CAPILLARY: Glucose-Capillary: 110 mg/dL — ABNORMAL HIGH (ref 70–99)

## 2020-10-14 MED ORDER — HYDROCODONE-ACETAMINOPHEN 5-325 MG PO TABS
1.0000 | ORAL_TABLET | ORAL | Status: DC | PRN
  Administered 2020-10-14 – 2020-10-15 (×4): 1 via ORAL
  Filled 2020-10-14 (×4): qty 1

## 2020-10-14 MED ORDER — ONDANSETRON HCL 4 MG/2ML IJ SOLN
4.0000 mg | Freq: Once | INTRAMUSCULAR | Status: AC
  Administered 2020-10-14: 4 mg via INTRAVENOUS
  Filled 2020-10-14: qty 2

## 2020-10-14 NOTE — Evaluation (Signed)
Occupational Therapy Evaluation Patient Details Name: [name] MRN: [id number] DOB: Oct 27, 1951 Today's Date: 10/14/2020    History of Present Illness Patient is a 69 year old female PMH recent multiple hospitalizations, GERD, hypertension, SIADH and chronic hyponatremia. Patient presents to ED with left hip and left shoulder pain after fall due being bumped by a [place]. XR show L sacral ala fx.    Clinical Impression   Patient lives alone in ground level apartment, recently D/C home ~10 days ago from rehab and reports doing well until being knocked down my another person's dog. Currently patient with increased pain, decreased balance and safety awareness. Patient impulsive requiring mod cues for safe hand placement, transfer technique and safe use of walker. Patient would have difficulty caring for herself independently at this time, therefore would recommend D/C with brother and [facility], if unable then short term rehab stay. Acute OT will continue to follow in order to maximize patient safety and independence with self care.    Follow Up Recommendations  Home health OT;Supervision/Assistance - 24 hour;Other (comment) (vs SNF if unable to stay with brother)    Equipment Recommendations  3 in 1 bedside commode;Other (comment) (also utilize as shower chair)       Precautions / Restrictions Precautions Precautions: Fall Restrictions Weight Bearing Restrictions: Yes LLE Weight Bearing: Weight bearing as tolerated      Mobility Bed Mobility Overal bed mobility: Needs Assistance Bed Mobility: Supine to Sit;Sit to Supine     Supine to sit: Supervision;HOB elevated Sit to supine: Min assist   General bed mobility comments: min A for safety back to bed due to patient laying down quickly with head near bed rail    Transfers Overall transfer level: Needs assistance Equipment used: Rolling walker (2 wheeled) Transfers: Sit to/from Stand Sit to Stand: Min guard          General transfer comment: patient stood from EOB before therapy ready, cue for safety and to utilize RW    Balance Overall balance assessment: Needs assistance Sitting-balance support: Feet supported Sitting balance-Leahy Scale: Good     Standing balance support: No upper extremity supported;Bilateral upper extremity supported Standing balance-Leahy Scale: Fair Standing balance comment: static standing patient maintained without UE support, increased pain/difficulty with ambulation therefore utilize walker                           ADL either performed or assessed with clinical judgement   ADL Overall ADL's : Needs assistance/impaired Eating/Feeding: Independent   Grooming: Set up;Sitting   Upper Body Bathing: Set up;Sitting   Lower Body Bathing: Minimal assistance;Sitting/lateral leans;Sit to/from stand   Upper Body Dressing : Set up;Sitting   Lower Body Dressing: Minimal assistance;Sitting/lateral leans;Sit to/from stand   Toilet Transfer: Minimal assistance;Ambulation;RW Toilet Transfer Details (indicate cue type and reason): simulated with functional ambulation, patient min A for safety due to impulsivity, verbal cues for safe hand placement  Toileting- Clothing Manipulation and Hygiene: Minimal assistance;Sit to/from stand       Functional mobility during ADLs: Minimal assistance;Cueing for safety;Rolling walker General ADL Comments: patient requiring increased assistance for self care tasks due to decrerased activity tolerance, safety awareness, balance and pain                   Pertinent Vitals/Pain Pain Assessment: 0-10 Pain Score: 7  Pain Location: L pelvis Pain Descriptors / Indicators: Grimacing;Guarding;Moaning;Discomfort Pain Intervention(s): Premedicated before session     Hand  Dominance Right   Extremity/Trunk Assessment Upper Extremity Assessment Upper Extremity Assessment: Overall WFL for tasks assessed   Lower Extremity  Assessment Lower Extremity Assessment: Defer to PT evaluation       Communication Communication Communication: No difficulties   Cognition Arousal/Alertness: Awake/alert Behavior During Therapy: Impulsive Overall Cognitive Status: Within Functional Limits for tasks assessed                                 General Comments: patient is tangential and impulsive requiring cues for safe hand placement on walker, during transfers              [place] expects to be discharged to:: Private residence Living Arrangements: Alone Available Help at Discharge: Family Type of Home: Apartment Home Access: Level entry     Home Layout: One level     Bathroom Shower/Tub: Teacher, early years/pre: Standard     Home Equipment: Environmental consultant - 2 wheels   Additional Comments: patient recently home from rehab ~10 days ago      Prior Functioning/Environment Level of Independence: Independent                 OT Problem List: Decreased activity tolerance;Impaired balance (sitting and/or standing);Decreased safety awareness;Pain      OT Treatment/Interventions: Self-care/ADL training;DME and/or AE instruction;Therapeutic activities;Patient/family education;Balance training    OT Goals(Current goals can be found in the care plan section) Acute Rehab OT Goals Patient Stated Goal: stay with my brother OT Goal Formulation: With patient Time For Goal Achievement: 10/28/20 Potential to Achieve Goals: Good  OT Frequency: Min 2X/week           Co-evaluation PT/OT/SLP Co-Evaluation/Treatment: Yes Reason for Co-Treatment: To address functional/ADL transfers;For patient/therapist safety   OT goals addressed during session: ADL's and self-care      AM-PAC OT "6 Clicks" Daily Activity     Outcome Measure Help from another person eating meals?: None Help from another person taking care of personal grooming?: A Little Help from another person  toileting, which includes using toliet, bedpan, or urinal?: A Little Help from another person bathing (including washing, rinsing, drying)?: A Little Help from another person to put on and taking off regular upper body clothing?: A Little Help from another person to put on and taking off regular lower body clothing?: A Little 6 Click Score: 19   End of Session Equipment Utilized During Treatment: Rolling walker;Gait belt Nurse Communication: Mobility status  Activity Tolerance: Patient tolerated treatment well Patient left: in bed;with call bell/phone within reach;with bed alarm set  OT Visit Diagnosis: Pain Pain - Right/Left: Left Pain - part of body: Hip                Time: 6468-0321 OT Time Calculation (min): 18 min Charges:  OT General Charges $OT Visit: 1 Visit OT Evaluation $OT Eval Low Complexity: 1 Low  Delbert Phenix OT OT pager: [place] 10/14/2020, 10:41 AM

## 2020-10-14 NOTE — Evaluation (Signed)
Physical Therapy Evaluation Patient Details Name: [name] MRN: [id number] DOB: Feb 22, 1951 Today's Date: 10/14/2020   History of Present Illness  Patient is a 69 year old female PMH recent multiple hospitalizations, GERD, hypertension, SIADH and chronic hyponatremia. Patient presents to ED with left hip and left shoulder pain after fall due being bumped by a [place]. XR show L sacral ala fx.   Clinical Impression  Pt admitted with above diagnosis. Pt slightly impulsive with mobility, reporting inability to stand or walk but then standing from EOB before RW in close proximity and educated by therapy. Pt limited in ambulation distance due to pain. Pt wanting to stay with brother to assist her while recovering instead of SNF, but unsure if pt's brother is in agreement; recommending HHPT if pt's brother able to assist and SNF if unable to assist. Pt currently with functional limitations due to the deficits listed below (see PT Problem List). Pt will benefit from skilled PT to increase their independence and safety with mobility to allow discharge to the venue listed below.       Follow Up Recommendations Home health PT;Supervision/Assistance - 24 hour;Other (comment) (vs SNF if unable to stay with brother)    Equipment Recommendations  Rolling walker with 5" wheels    Recommendations for Other Services       Precautions / Restrictions Precautions Precautions: Fall Restrictions Weight Bearing Restrictions: No LLE Weight Bearing: Weight bearing as tolerated      Mobility  Bed Mobility Overal bed mobility: Needs Assistance Bed Mobility: Supine to Sit;Sit to Supine  Supine to sit: Supervision;HOB elevated Sit to supine: Min assist   General bed mobility comments: min A for safety back to bed due to patient laying down quickly with head near bed rail    Transfers Overall transfer level: Needs assistance Equipment used: Rolling walker (2 wheeled) Transfers: Sit to/from  Stand Sit to Stand: Min guard    General transfer comment: patient stood from EOB before therapy ready, cue for safety and to utilize RW  Ambulation/Gait Ambulation/Gait assistance: Counsellor (Feet): 80 Feet Assistive device: Rolling walker (2 wheeled) Gait Pattern/deviations: Step-through pattern;Decreased stance time - left;Decreased stride length;Trunk flexed     General Gait Details: decreased weight-shift LLE, trunk flexed with improvement when cued, impulsive with turns and cued for RW management, limited due to L hip/sacral pain  Stairs            Wheelchair Mobility    Modified Rankin (Stroke Patients Only)       Balance Overall balance assessment: Needs assistance Sitting-balance support: Feet supported Sitting balance-Leahy Scale: Good Sitting balance - Comments: seated EOB   Standing balance support: No upper extremity supported;Bilateral upper extremity supported Standing balance-Leahy Scale: Fair Standing balance comment: static standing patient maintained without UE support, RW with ambulation due to increased pain/difficulty                Pertinent Vitals/Pain Pain Assessment: 0-10 Pain Score: 7  Pain Location: L pelvis Pain Descriptors / Indicators: Grimacing;Guarding;Moaning;Discomfort Pain Intervention(s): Limited activity within patient's tolerance;Monitored during session;Premedicated before session;Repositioned    Home Living Family/patient expects to be discharged to:: Private residence Living Arrangements: Alone Available Help at Discharge: Family Type of Home: Apartment Home Access: Level entry     Home Layout: One level Home Equipment: Environmental consultant - 2 wheels Additional Comments: patient recently home from rehab ~10 days ago    Prior Function Level of Independence: Independent  Comments: Pt reports independent with community ambulation and ADLs. Pt has dad's RW, unsure if adjusts to her height.     Hand  Dominance   Dominant Hand: Right    Extremity/Trunk Assessment   Upper Extremity Assessment Upper Extremity Assessment: Defer to OT evaluation    Lower Extremity Assessment Lower Extremity Assessment: Overall WFL for tasks assessed (L hip 3+/5)    Cervical / Trunk Assessment Cervical / Trunk Assessment: Normal  Communication   Communication: No difficulties  Cognition Arousal/Alertness: Awake/alert Behavior During Therapy: Impulsive Overall Cognitive Status: Within Functional Limits for tasks assessed  General Comments: patient is tangential and impulsive requiring cues for attention to task and safe hand placement on walker, during transfers      General Comments      Exercises     Assessment/Plan    PT Assessment Patient needs continued PT services  PT Problem List Decreased activity tolerance;Decreased balance;Decreased knowledge of use of DME;Decreased safety awareness;Pain       PT Treatment Interventions DME instruction;Gait training;Functional mobility training;Therapeutic activities;Therapeutic exercise;Balance training;Neuromuscular re-education;Patient/family education    PT Goals (Current goals can be found in the Care Plan section)  Acute Rehab PT Goals Patient Stated Goal: stay with my brother PT Goal Formulation: With patient Time For Goal Achievement: 10/28/20 Potential to Achieve Goals: Good    Frequency Min 3X/week   Barriers to discharge        Co-evaluation PT/OT/SLP Co-Evaluation/Treatment: Yes Reason for Co-Treatment: For patient/therapist safety;To address functional/ADL transfers PT goals addressed during session: Mobility/safety with mobility;Balance;Proper use of DME OT goals addressed during session: ADL's and self-care       AM-PAC PT "6 Clicks" Mobility  Outcome Measure Help needed turning from your back to your side while in a flat bed without using bedrails?: None Help needed moving from lying on your back to sitting on the  side of a flat bed without using bedrails?: None Help needed moving to and from a bed to a chair (including a wheelchair)?: A Little Help needed standing up from a chair using your arms (e.g., wheelchair or bedside chair)?: A Little Help needed to walk in hospital room?: A Little Help needed climbing 3-5 steps with a railing? : A Lot 6 Click Score: 19    End of Session Equipment Utilized During Treatment: Gait belt Activity Tolerance: Patient tolerated treatment well;Patient limited by pain Patient left: in bed;with call bell/phone within reach;with bed alarm set Nurse Communication: Mobility status;Weight bearing status PT Visit Diagnosis: Unsteadiness on feet (R26.81);Other abnormalities of gait and mobility (R26.89);Pain Pain - Right/Left: Left Pain - part of body: Hip    Time: 7371-0626 PT Time Calculation (min) (ACUTE ONLY): 18 min   Charges:   PT Evaluation $PT Eval Low Complexity: 1 Low           Tori Elan [place] PT, DPT 10/14/20, 1:44 PM

## 2020-10-14 NOTE — TOC Initial Note (Signed)
Transition of Care [facility]) - Initial/Assessment Note    Patient Details  Name: [name] MRN: [id number] Date of Birth: 26-Nov-1950  Transition of Care [facility]) CM/SW Contact:    Lia Hopping, [place] Phone Number: 10/14/2020, 3:33 PM  Clinical Narrative:    Re: Home Health/in care of her brother.               CSW met with the patient at bedside to discuss her disposition. Patient reports she was recently at [facility] for rehab. She says she does not any money to pay a copay for her stay. Patient is making arrangements for her brother to stay with her. Patient reports prior to falling again, she was "getting back to doing things for myself." Patient even started driving again. Patient confirms she has a RW and would like a 3 in1. She Patient notified CSW she had a call from home health [facility]. Patient allowed CSW to make a return call. Patient is active with [facility] for PT/OT services and the Occupational therapist had planned to visit the patient on 12/4 since the patient is in the hospital they will see the patient on Sunday.  CSW made a referral to [facility] for a 3 in1. DME will be delivered to the patient bedside.  Patient reports her brother will provide transport.   Expected Discharge Plan: [place] Barriers to Discharge: Barriers Resolved   Patient Goals and CMS Choice Patient states their goals for this hospitalization and ongoing recovery are:: home      Expected Discharge Plan and Services Expected Discharge Plan: [place] In-house Referral: Clinical Social Work Discharge Planning Services: CM Consult Post Acute Care Choice: [place] arrangements for the past 2 months: [place]                 DME Arranged: 3-N-1 DME Agency: [employer] Date DME Agency Contacted: 10/14/20 Time DME Agency Contacted: [phone] Representative spoke with at DME Agency: Freda Munro HH Arranged: PT, OT [place] Agency: [facility] Date [place]: 10/14/20 Time [place]: 72 Representative spoke with at [place]: [place]  Prior Living Arrangements/Services Living arrangements for the past 2 months: [place] with:: Self Patient language and need for interpreter reviewed:: Yes Do you feel safe going back to the place where you live?: Yes      Need for Family Participation in Patient Care: Yes (Comment) Care giver support system in place?: Yes (comment) Current home services: DME, Home OT, Home PT Criminal Activity/Legal Involvement Pertinent to Current Situation/Hospitalization: No - Comment as needed  Activities of Daily Living Home Assistive Devices/Equipment: None ADL Screening (condition at time of admission) Patient's cognitive ability adequate to safely complete daily activities?: Yes Is the patient deaf or have difficulty hearing?: No Does the patient have difficulty seeing, even when wearing glasses/contacts?: No Does the patient have difficulty concentrating, remembering, or making decisions?: No Patient able to express need for assistance with ADLs?: Yes Does the patient have difficulty dressing or bathing?: Yes Independently performs ADLs?: Yes (appropriate for developmental age) Does the patient have difficulty walking or climbing stairs?: Yes Weakness of Legs: Both Weakness of Arms/Hands: None  Permission Sought/Granted Permission sought to share information with : Case Manager Permission granted to share information with : Yes, Verbal Permission Granted              Emotional Assessment Appearance:: Appears stated age Attitude/Demeanor/Rapport: Engaged Affect (typically  observed): Accepting Orientation: : Oriented to Self, Oriented to Place, Oriented to  Time, Oriented to Situation Alcohol / Substance Use: Not Applicable Psych Involvement: No (comment)  Admission diagnosis:  Sacrum and coccyx fracture ([place]) [S32.10XA, S32.2XXA] Closed fracture of sacrum  and coccyx, initial encounter ([place]) [S32.10XA, S32.2XXA] Fall, initial encounter [W19.XXXA] Patient Active Problem List   Diagnosis Date Noted  . Sacrum and coccyx fracture ([place]) 10/13/2020  . Neurocognitive deficits 09/20/2020  . AKI (acute kidney injury) ([place]) 09/15/2020  . CHF (congestive heart failure) ([place])   . Hiatal hernia   . Malnutrition of severe degree 08/29/2020  . Respiratory failure ([place])   . Community acquired pneumonia of right lower lobe of lung   . Somnolence   . Failure to thrive in adult   . Generalized weakness 05/19/2020  . Hypokalemia 05/19/2020  . Hyponatremia 02/03/2020  . Ambulatory dysfunction   . Prediabetes 03/23/2013  . Hyperlipidemia 03/23/2013  . Colon cancer screening 11/18/2012  . Hypertension 09/16/2012  . GERD (gastroesophageal reflux disease) 09/16/2012  . Tobacco use 09/16/2012   PCP:  Nicholes Rough, PA-C Pharmacy:   [place], [place] - [address] [place] [place] [address] Phone: [phone] Fax: [phone]     Social Determinants of Health (SDOH) Interventions    Readmission Risk Interventions No flowsheet data found.

## 2020-10-14 NOTE — Progress Notes (Signed)
PROGRESS NOTE    [name]  ZOX:[id number] DOB: 1951/05/01 DOA: 10/13/2020 PCP: Nicholes Rough, PA-C    Brief Narrative:  Patient with history of GERD, ascites, hypertension and chronic hyponatremia presented to the ER with left hip pain and difficulty to walk after knocked down by a [place].  In the emergency room, she was hemodynamically stable.  Skeletal survey negative.  CT scan of the pelvis showed left sacral alla fracture undisplaced.  Was unable to ambulate so admitted to the hospital.   Assessment & Plan:   Principal Problem:   Sacrum and coccyx fracture (HCC) Active Problems:   Hypertension   GERD (gastroesophageal reflux disease)   Tobacco use   Hyperlipidemia   Hyponatremia   Ambulatory dysfunction  Traumatic closed sacral alla fracture with pain and ambulatory dysfunction: Adequate pain medications.  Will try to manage pain with oral opiates and local lidocaine patch.  As needed IV opiates available. Weightbearing as tolerated. Mobilize with PT OT. Will need supervision for safety.  If she is able to have family member stay with her, she will be able to go home with home health PT.  Otherwise will need referral to inpatient therapy at a skilled nursing facility.  SIADH: Chronic hyponatremia.  Sodium is stable.  Anemia of chronic disease: Stable.   DVT prophylaxis: enoxaparin (LOVENOX) injection 30 mg Start: 10/13/20 2200   Code Status: Full code Family Communication: None Disposition Plan: Status is: Observation  The patient remains OBS appropriate and will d/c before 2 midnights.  Dispo: The patient is from: Home              Anticipated d/c is to: Home with home health versus SNF.              Anticipated d/c date is: 2 days              Patient currently is medically stable to d/c.  Patient does not have safe disposition yet.         Consultants:   None  Procedures:   None  Antimicrobials:   None   Subjective: Patient  seen and examined.  She was working with PT OT and was with much pain while ambulating.  Pain is controlled with oral opiates only hurts on mobility.  She is ready to go home if her brother is able to take her home.  Objective: Vitals:   10/13/20 2105 10/13/20 2228 10/14/20 0225 10/14/20 0618  BP:  (!) 165/68 (!) 158/91 (!) 135/91  Pulse:  95 78 81  Resp:  18 18 17   Temp:  98.5 F (36.9 C) 98.8 F (37.1 C) 97.8 F (36.6 C)  TempSrc:  Oral Oral Oral  SpO2:  95% (!) 85% (!) 87%  Weight: 40.5 kg     Height: 5\' 2"  (1.575 m)       Intake/Output Summary (Last 24 hours) at 10/14/2020 1146 Last data filed at 10/14/2020 1010 Gross per 24 hour  Intake 180 ml  Output 900 ml  Net -720 ml   Filed Weights   10/13/20 2105  Weight: 40.5 kg    Examination:  General exam: Appears calm and comfortable  In mild distress while walking with pain and discomfort. Respiratory system: Clear to auscultation. Respiratory effort normal. Cardiovascular system: S1 & S2 heard, RRR. No JVD, murmurs, rubs, gallops or clicks. No pedal edema. Gastrointestinal system: Abdomen is nondistended, soft and nontender. No organomegaly or masses felt. Normal bowel sounds heard. Central nervous system:  Alert and oriented. No focal neurological deficits. Extremities: Symmetric 5 x 5 power. Some tenderness along the left side of the hip.  Range of motion is normal. Skin: No rashes, lesions or ulcers Psychiatry: Judgement and insight appear normal. Mood & affect appropriate.     Data Reviewed: I have personally reviewed following labs and imaging studies  CBC: Recent Labs  Lab 10/13/20 1515  WBC 15.8*  NEUTROABS 11.3*  HGB 8.7*  HCT 27.2*  MCV 83.4  PLT 175*   Basic Metabolic Panel: Recent Labs  Lab 10/13/20 1515  NA 130*  K 4.9  CL 93*  CO2 27  GLUCOSE 116*  BUN 15  CREATININE 1.04*  CALCIUM 9.0   GFR: Estimated Creatinine Clearance: 33.6 mL/min (A) (by C-G formula based on SCr of 1.04 mg/dL  (H)). Liver Function Tests: No results for input(s): AST, ALT, ALKPHOS, BILITOT, PROT, ALBUMIN in the last 168 hours. No results for input(s): LIPASE, AMYLASE in the last 168 hours. No results for input(s): AMMONIA in the last 168 hours. Coagulation Profile: No results for input(s): INR, PROTIME in the last 168 hours. Cardiac Enzymes: No results for input(s): CKTOTAL, CKMB, CKMBINDEX, TROPONINI in the last 168 hours. BNP (last 3 results) No results for input(s): PROBNP in the last 8760 hours. HbA1C: No results for input(s): HGBA1C in the last 72 hours. CBG: Recent Labs  Lab 10/14/20 0830  GLUCAP 110*   Lipid Profile: No results for input(s): CHOL, HDL, LDLCALC, TRIG, CHOLHDL, LDLDIRECT in the last 72 hours. Thyroid Function Tests: No results for input(s): TSH, T4TOTAL, FREET4, T3FREE, THYROIDAB in the last 72 hours. Anemia Panel: No results for input(s): VITAMINB12, FOLATE, FERRITIN, TIBC, IRON, RETICCTPCT in the last 72 hours. Sepsis Labs: No results for input(s): PROCALCITON, LATICACIDVEN in the last 168 hours.  Recent Results (from the past 240 hour(s))  Resp Panel by RT-PCR (Flu A&B, Covid) Nasopharyngeal Swab     Status: None   Collection Time: 10/13/20  3:15 PM   Specimen: Nasopharyngeal Swab; Nasopharyngeal(NP) swabs in vial transport medium  Result Value Ref Range Status   SARS Coronavirus 2 by RT PCR NEGATIVE NEGATIVE Final    Comment: (NOTE) SARS-CoV-2 target nucleic acids are NOT DETECTED.  The SARS-CoV-2 RNA is generally detectable in upper respiratory specimens during the acute phase of infection. The lowest concentration of SARS-CoV-2 viral copies this assay can detect is 138 copies/mL. A negative result does not preclude SARS-Cov-2 infection and should not be used as the sole basis for treatment or other patient management decisions. A negative result may occur with  improper specimen collection/handling, submission of specimen other than nasopharyngeal  swab, presence of viral mutation(s) within the areas targeted by this assay, and inadequate number of viral copies(<138 copies/mL). A negative result must be combined with clinical observations, patient history, and epidemiological information. The expected result is Negative.  Fact Sheet for Patients:  EntrepreneurPulse.com.au  Fact Sheet for Healthcare Providers:  IncredibleEmployment.be  This test is no t yet approved or cleared by the [place] FDA and  has been authorized for detection and/or diagnosis of SARS-CoV-2 by FDA under an Emergency Use Authorization (EUA). This EUA will remain  in effect (meaning this test can be used) for the duration of the COVID-19 declaration under Section 564(b)(1) of the Act, 21 U.S.C.section 360bbb-3(b)(1), unless the authorization is terminated  or revoked sooner.       Influenza A by PCR NEGATIVE NEGATIVE Final   Influenza B by PCR NEGATIVE NEGATIVE Final  Comment: (NOTE) The Xpert Xpress SARS-CoV-2/FLU/RSV plus assay is intended as an aid in the diagnosis of influenza from Nasopharyngeal swab specimens and should not be used as a sole basis for treatment. Nasal washings and aspirates are unacceptable for Xpert Xpress SARS-CoV-2/FLU/RSV testing.  Fact Sheet for Patients: EntrepreneurPulse.com.au  Fact Sheet for Healthcare Providers: IncredibleEmployment.be  This test is not yet approved or cleared by the [place] FDA and has been authorized for detection and/or diagnosis of SARS-CoV-2 by FDA under an Emergency Use Authorization (EUA). This EUA will remain in effect (meaning this test can be used) for the duration of the COVID-19 declaration under Section 564(b)(1) of the Act, 21 U.S.C. section 360bbb-3(b)(1), unless the authorization is terminated or revoked.  Performed at [facility], [place] [address], [place], [place] [address]           Radiology Studies: DG Lumbar Spine Complete  Result Date: 10/13/2020 CLINICAL DATA:  Low back pain after fall last night. EXAM: LUMBAR SPINE - COMPLETE 4+ VIEW COMPARISON:  None. FINDINGS: Diffuse osteopenia is noted. No fracture or spondylolisthesis is noted. Mild degenerative disc disease is noted at L1-2 and L2-3. Moderate degenerative disc disease is noted at L5-S1. IMPRESSION: Multilevel degenerative disc disease. No acute abnormality seen in the lumbar spine. Electronically Signed   By: Marijo Conception M.D.   On: 10/13/2020 12:03   CT PELVIS WO CONTRAST  Result Date: 10/13/2020 CLINICAL DATA:  Patient status post fall last night which she was knocked down by a [place]. Initial encounter. EXAM: CT PELVIS WITHOUT CONTRAST TECHNIQUE: Multidetector CT imaging of the pelvis was performed following the standard protocol without intravenous contrast. COMPARISON:  Plain films of the lumbar spine and left hip this same day. FINDINGS: Bones/Joint/Cartilage The patient has an acute, nondisplaced fracture of the left sacrum. No other fracture is identified. The hips are located. Bones are osteopenic. Hip joint spaces are preserved. Mild degenerative change at the symphysis pubis and about the SI joints noted. No lytic or sclerotic lesion. Ligaments Suboptimally assessed by CT. Muscles and Tendons Negative.  No acute or focal abnormality. Soft tissues Imaged intrapelvic contents demonstrate aortic atherosclerosis. The patient is status post hysterectomy. IMPRESSION: Acute, nondisplaced left sacral ala fracture. No other acute abnormality is identified. Osteopenia. Aortic Atherosclerosis (ICD10-I70.0). Electronically Signed   By: Inge Rise M.D.   On: 10/13/2020 13:04   DG Shoulder Left  Result Date: 10/13/2020 CLINICAL DATA:  70 year old female with fall EXAM: LEFT SHOULDER - 2+ VIEW COMPARISON:  None. FINDINGS: No acute displaced fracture. Glenohumeral joint appears congruent.  Degenerative changes at the [facility] joint. IMPRESSION: Negative for acute bony abnormality Electronically Signed   By: Corrie Mckusick D.O.   On: 10/13/2020 12:01   DG Hip Unilat W or Wo Pelvis 2-3 Views Left  Result Date: 10/13/2020 CLINICAL DATA:  Left hip pain after fall last night. EXAM: DG HIP (WITH OR WITHOUT PELVIS) 2-3V LEFT COMPARISON:  None. FINDINGS: There is no evidence of hip fracture or dislocation. There is no evidence of arthropathy or other focal bone abnormality. IMPRESSION: Negative. Electronically Signed   By: Marijo Conception M.D.   On: 10/13/2020 12:02        Scheduled Meds: . docusate sodium  100 mg Oral BID  . enoxaparin (LOVENOX) injection  30 mg Subcutaneous Q24H  . famotidine  20 mg Oral Daily  . furosemide  20 mg Oral Daily  . lidocaine  1 patch Transdermal Q24H  . metoprolol  tartrate  25 mg Oral BID  . pantoprazole  40 mg Oral BID  . polyethylene glycol  17 g Oral Daily   Continuous Infusions:   LOS: 0 days    Time spent: 25 minutes    Barb Merino, MD [facility] Pager [phone]

## 2020-10-15 DIAGNOSIS — S3210XA Unspecified fracture of sacrum, initial encounter for closed fracture: Secondary | ICD-10-CM | POA: Diagnosis not present

## 2020-10-15 DIAGNOSIS — S322XXA Fracture of coccyx, initial encounter for closed fracture: Secondary | ICD-10-CM | POA: Diagnosis not present

## 2020-10-15 LAB — GLUCOSE, CAPILLARY: Glucose-Capillary: 83 mg/dL (ref 70–99)

## 2020-10-15 MED ORDER — HYDROCODONE-ACETAMINOPHEN 5-325 MG PO TABS: 1.0000 | ORAL_TABLET | Freq: Four times a day (QID) | ORAL | 0 refills | Status: AC | PRN

## 2020-10-15 MED ORDER — LIDOCAINE 5 % EX PTCH: 1.0000 | MEDICATED_PATCH | CUTANEOUS | 0 refills | Status: AC

## 2020-10-15 NOTE — Discharge Summary (Signed)
Physician Discharge Summary  [name] STM:[id number] DOB: 08-19-1951 DOA: 10/13/2020  PCP: Nicholes Rough, PA-C  Admit date: 10/13/2020 Discharge date: 10/15/2020  Admitted From: Home Disposition: Home with home health  Recommendations for Outpatient Follow-up:  1. Follow up with PCP in 1-2 weeks 2. Weightbearing as tolerated.  Adequate pain medications and laxatives.  Home Health: Home health PT/OT Equipment/Devices: Bedside commode, wheeled walker  Discharge Condition: Stable CODE STATUS: Full code Diet recommendation: Regular diet  Discharge summary: 69 year old with history of GERD, ascites, hypertension and chronic hyponatremia presented to the ER with left hip pain and left shoulder pain after she was knocked down by a [place] and fell on her back.  Skeletal survey was negative.  A CT scan of the pelvis showed undisplaced uncomplicated left sacral alla fracture.  No other complications.  Patient was monitored in the hospital for adequate pain relief and therapy evaluations.  Plan: Pain managed with oral opiates and local lidocaine patches. Weightbearing as tolerated. She was mobilized with PT and OT.  Recommended continue physical therapy.  She has adequate support at home and she is going to her brother's house.  She is safe with supervision.  She will do therapies at home.  Discharge Diagnoses:  Principal Problem:   Sacrum and coccyx fracture (HCC) Active Problems:   Hypertension   GERD (gastroesophageal reflux disease)   Tobacco use   Hyperlipidemia   Hyponatremia   Ambulatory dysfunction    Discharge Instructions  Discharge Instructions    Call MD for:  severe uncontrolled pain   Complete by: As directed    Diet - low sodium heart healthy   Complete by: As directed    Discharge instructions   Complete by: As directed    Use laxatives with opioid pain medicines. Start taking your tramadol after finishing norco prescriptions   Increase activity  slowly   Complete by: As directed    Other Restrictions   Complete by: As directed    No weight bearing restrictions, mobilize as much you can     Allergies as of 10/15/2020      Reactions   Iodine Shortness Of Breath, Rash   Thiazide-type Diuretics Other (See Comments)   Severe hyponatremia July 2021   Shellfish Allergy    Causes rash      Medication List    STOP taking these medications   docusate 50 MG/5ML liquid Commonly known as: COLACE   Ensure   nicotine 21 mg/24hr patch Commonly known as: NICODERM CQ - dosed in mg/24 hours   polyethylene glycol 17 g packet Commonly known as: MIRALAX / GLYCOLAX     TAKE these medications   alum & mag hydroxide-simeth 200-200-20 MG/5ML suspension Commonly known as: MAALOX/MYLANTA Take 30 mLs by mouth every 4 (four) hours as needed for indigestion or heartburn.   baclofen 10 MG tablet Commonly known as: LIORESAL Take 10 mg by mouth 4 (four) times daily as needed for muscle spasms.   famotidine 20 MG tablet Commonly known as: PEPCID Take 1 tablet (20 mg total) by mouth daily. What changed:   when to take this  reasons to take this   furosemide 20 MG tablet Commonly known as: LASIX Take 1 tablet (20 mg total) by mouth daily.   gabapentin 100 MG capsule Commonly known as: NEURONTIN Take 1 capsule (100 mg total) by mouth 2 (two) times daily.   HYDROcodone-acetaminophen 5-325 MG tablet Commonly known as: NORCO/VICODIN Take 1 tablet by mouth every 6 (six) hours  as needed for up to 5 days for moderate pain or severe pain (use if tylenol is not effective).   hydrOXYzine 25 MG tablet Commonly known as: ATARAX/VISTARIL Take 1 tablet (25 mg total) by mouth every 8 (eight) hours as needed for up to 14 days. What changed: reasons to take this   lidocaine 5 % Commonly known as: LIDODERM Place 1 patch onto the skin daily for 15 days. Remove & Discard patch within 12 hours or as directed by MD   metoprolol tartrate 25 MG  tablet Commonly known as: LOPRESSOR Take 1 tablet (25 mg total) by mouth 2 (two) times daily.   pantoprazole 40 MG tablet Commonly known as: PROTONIX Take 1 tablet (40 mg total) by mouth 2 (two) times daily. What changed:   how much to take  when to take this   potassium chloride 20 MEQ/15ML (10%) Soln Take 15 mLs (20 mEq total) by mouth daily.   sodium chloride 0.65 % nasal spray Commonly known as: OCEAN Place 2 sprays into the nose in the morning, at noon, and at bedtime.   traMADol 50 MG tablet Commonly known as: ULTRAM Take 1 tablet (50 mg total) by mouth every 8 (eight) hours as needed for severe pain. What changed: reasons to take this   traZODone 50 MG tablet Commonly known as: DESYREL Take 50 mg by mouth at bedtime.            Durable Medical Equipment  (From admission, onward)         Start     Ordered   10/14/20 1549  For home use only DME 3 n 1  Once        10/14/20 1548   10/14/20 1431  For home use only DME 4 wheeled rolling walker with seat  Once       Question:  Patient needs a walker to treat with the following condition  Answer:  Sacrum and coccyx fracture ([place])   10/14/20 1430          Follow-up Information    Nicholes Rough, PA-C Follow up in 2 week(s).   Specialty: Physician Assistant Contact information: [address] [place] [address] [phone]              Allergies  Allergen Reactions  . Iodine Shortness Of Breath and Rash  . Thiazide-Type Diuretics Other (See Comments)    Severe hyponatremia July 2021  . Shellfish Allergy     Causes rash    Consultations:  None   Procedures/Studies: DG Lumbar Spine Complete  Result Date: 10/13/2020 CLINICAL DATA:  Low back pain after fall last night. EXAM: LUMBAR SPINE - COMPLETE 4+ VIEW COMPARISON:  None. FINDINGS: Diffuse osteopenia is noted. No fracture or spondylolisthesis is noted. Mild degenerative disc disease is noted at L1-2 and L2-3. Moderate degenerative disc  disease is noted at L5-S1. IMPRESSION: Multilevel degenerative disc disease. No acute abnormality seen in the lumbar spine. Electronically Signed   By: Marijo Conception M.D.   On: 10/13/2020 12:03   CT PELVIS WO CONTRAST  Result Date: 10/13/2020 CLINICAL DATA:  Patient status post fall last night which she was knocked down by a [place]. Initial encounter. EXAM: CT PELVIS WITHOUT CONTRAST TECHNIQUE: Multidetector CT imaging of the pelvis was performed following the standard protocol without intravenous contrast. COMPARISON:  Plain films of the lumbar spine and left hip this same day. FINDINGS: Bones/Joint/Cartilage The patient has an acute, nondisplaced fracture of the left sacrum.  No other fracture is identified. The hips are located. Bones are osteopenic. Hip joint spaces are preserved. Mild degenerative change at the symphysis pubis and about the SI joints noted. No lytic or sclerotic lesion. Ligaments Suboptimally assessed by CT. Muscles and Tendons Negative.  No acute or focal abnormality. Soft tissues Imaged intrapelvic contents demonstrate aortic atherosclerosis. The patient is status post hysterectomy. IMPRESSION: Acute, nondisplaced left sacral ala fracture. No other acute abnormality is identified. Osteopenia. Aortic Atherosclerosis (ICD10-I70.0). Electronically Signed   By: Inge Rise M.D.   On: 10/13/2020 13:04   DG Shoulder Left  Result Date: 10/13/2020 CLINICAL DATA:  69 year old female with fall EXAM: LEFT SHOULDER - 2+ VIEW COMPARISON:  None. FINDINGS: No acute displaced fracture. Glenohumeral joint appears congruent. Degenerative changes at the [facility] joint. IMPRESSION: Negative for acute bony abnormality Electronically Signed   By: Corrie Mckusick D.O.   On: 10/13/2020 12:01   DG Hip Unilat W or Wo Pelvis 2-3 Views Left  Result Date: 10/13/2020 CLINICAL DATA:  Left hip pain after fall last night. EXAM: DG HIP (WITH OR WITHOUT PELVIS) 2-3V LEFT COMPARISON:  None. FINDINGS: There is  no evidence of hip fracture or dislocation. There is no evidence of arthropathy or other focal bone abnormality. IMPRESSION: Negative. Electronically Signed   By: Marijo Conception M.D.   On: 10/13/2020 12:02    (Echo, Carotid, EGD, Colonoscopy, ERCP)    Subjective: Patient seen and examined.  No complaints at rest.  It hurts on her left hip on walking.  Eager to go home.  Her brother is picking her up.   Discharge Exam: Vitals:   10/15/20 0547 10/15/20 0845  BP: 122/79 (!) 148/94  Pulse: 77 88  Resp: 17 16  Temp: 98.7 F (37.1 C) 98.5 F (36.9 C)  SpO2: 96% (!) 89%   Vitals:   10/14/20 2117 10/15/20 0500 10/15/20 0547 10/15/20 0845  BP: (!) 144/91  122/79 (!) 148/94  Pulse: 95  77 88  Resp: 17  17 16   Temp: 98.9 F (37.2 C)  98.7 F (37.1 C) 98.5 F (36.9 C)  TempSrc: Oral     SpO2: 95%  96% (!) 89%  Weight:  39.2 kg    Height:        General: Pt is alert, awake, not in acute distress Cardiovascular: RRR, S1/S2 +, no rubs, no gallops Respiratory: CTA bilaterally, no wheezing, no rhonchi Abdominal: Soft, NT, ND, bowel sounds + Extremities: no edema, no cyanosis Patient does have some tenderness on the left posterior hip.  Range of motion is normal.    The results of significant diagnostics from this hospitalization (including imaging, microbiology, ancillary and laboratory) are listed below for reference.     Microbiology: Recent Results (from the past 240 hour(s))  Resp Panel by RT-PCR (Flu A&B, Covid) Nasopharyngeal Swab     Status: None   Collection Time: 10/13/20  3:15 PM   Specimen: Nasopharyngeal Swab; Nasopharyngeal(NP) swabs in vial transport medium  Result Value Ref Range Status   SARS Coronavirus 2 by RT PCR NEGATIVE NEGATIVE Final    Comment: (NOTE) SARS-CoV-2 target nucleic acids are NOT DETECTED.  The SARS-CoV-2 RNA is generally detectable in upper respiratory specimens during the acute phase of infection. The lowest concentration of SARS-CoV-2  viral copies this assay can detect is 138 copies/mL. A negative result does not preclude SARS-Cov-2 infection and should not be used as the sole basis for treatment or other patient management decisions. A negative result may  occur with  improper specimen collection/handling, submission of specimen other than nasopharyngeal swab, presence of viral mutation(s) within the areas targeted by this assay, and inadequate number of viral copies(<138 copies/mL). A negative result must be combined with clinical observations, patient history, and epidemiological information. The expected result is Negative.  Fact Sheet for Patients:  EntrepreneurPulse.com.au  Fact Sheet for Healthcare Providers:  IncredibleEmployment.be  This test is no t yet approved or cleared by the [place] FDA and  has been authorized for detection and/or diagnosis of SARS-CoV-2 by FDA under an Emergency Use Authorization (EUA). This EUA will remain  in effect (meaning this test can be used) for the duration of the COVID-19 declaration under Section 564(b)(1) of the Act, 21 U.S.C.section 360bbb-3(b)(1), unless the authorization is terminated  or revoked sooner.       Influenza A by PCR NEGATIVE NEGATIVE Final   Influenza B by PCR NEGATIVE NEGATIVE Final    Comment: (NOTE) The Xpert Xpress SARS-CoV-2/FLU/RSV plus assay is intended as an aid in the diagnosis of influenza from Nasopharyngeal swab specimens and should not be used as a sole basis for treatment. Nasal washings and aspirates are unacceptable for Xpert Xpress SARS-CoV-2/FLU/RSV testing.  Fact Sheet for Patients: EntrepreneurPulse.com.au  Fact Sheet for Healthcare Providers: IncredibleEmployment.be  This test is not yet approved or cleared by the [place] FDA and has been authorized for detection and/or diagnosis of SARS-CoV-2 by FDA under an Emergency Use Authorization  (EUA). This EUA will remain in effect (meaning this test can be used) for the duration of the COVID-19 declaration under Section 564(b)(1) of the Act, 21 U.S.C. section 360bbb-3(b)(1), unless the authorization is terminated or revoked.  Performed at [facility], [place] [address], [place],  [address]      Labs: BNP (last 3 results) Recent Labs    02/10/20 0756 09/07/20 1701 09/11/20 0812  BNP 547.1* 3,015.3* 932.6*   Basic Metabolic Panel: Recent Labs  Lab 10/13/20 1515  NA 130*  K 4.9  CL 93*  CO2 27  GLUCOSE 116*  BUN 15  CREATININE 1.04*  CALCIUM 9.0   Liver Function Tests: No results for input(s): AST, ALT, ALKPHOS, BILITOT, PROT, ALBUMIN in the last 168 hours. No results for input(s): LIPASE, AMYLASE in the last 168 hours. No results for input(s): AMMONIA in the last 168 hours. CBC: Recent Labs  Lab 10/13/20 1515  WBC 15.8*  NEUTROABS 11.3*  HGB 8.7*  HCT 27.2*  MCV 83.4  PLT 529*   Cardiac Enzymes: No results for input(s): CKTOTAL, CKMB, CKMBINDEX, TROPONINI in the last 168 hours. BNP: Invalid input(s): POCBNP CBG: Recent Labs  Lab 10/14/20 0830 10/15/20 0723  GLUCAP 110* 83   D-Dimer No results for input(s): DDIMER in the last 72 hours. Hgb A1c No results for input(s): HGBA1C in the last 72 hours. Lipid Profile No results for input(s): CHOL, HDL, LDLCALC, TRIG, CHOLHDL, LDLDIRECT in the last 72 hours. Thyroid function studies No results for input(s): TSH, T4TOTAL, T3FREE, THYROIDAB in the last 72 hours.  Invalid input(s): FREET3 Anemia work up No results for input(s): VITAMINB12, FOLATE, FERRITIN, TIBC, IRON, RETICCTPCT in the last 72 hours. Urinalysis    Component Value Date/Time   COLORURINE YELLOW 08/28/2020 2022   APPEARANCEUR HAZY (A) 08/28/2020 2022   LABSPEC 1.012 08/28/2020 2022   PHURINE 5.0 08/28/2020 2022   GLUCOSEU 50 (A) 08/28/2020 2022   HGBUR MODERATE (A) 08/28/2020 2022   BILIRUBINUR  NEGATIVE 08/28/2020 2022   KETONESUR 5 (  A) 08/28/2020 2022   PROTEINUR 30 (A) 08/28/2020 2022   UROBILINOGEN 0.2 05/25/2011 1337   NITRITE NEGATIVE 08/28/2020 2022   LEUKOCYTESUR TRACE (A) 08/28/2020 2022   Sepsis Labs Invalid input(s): PROCALCITONIN,  WBC,  LACTICIDVEN Microbiology Recent Results (from the past 240 hour(s))  Resp Panel by RT-PCR (Flu A&B, Covid) Nasopharyngeal Swab     Status: None   Collection Time: 10/13/20  3:15 PM   Specimen: Nasopharyngeal Swab; Nasopharyngeal(NP) swabs in vial transport medium  Result Value Ref Range Status   SARS Coronavirus 2 by RT PCR NEGATIVE NEGATIVE Final    Comment: (NOTE) SARS-CoV-2 target nucleic acids are NOT DETECTED.  The SARS-CoV-2 RNA is generally detectable in upper respiratory specimens during the acute phase of infection. The lowest concentration of SARS-CoV-2 viral copies this assay can detect is 138 copies/mL. A negative result does not preclude SARS-Cov-2 infection and should not be used as the sole basis for treatment or other patient management decisions. A negative result may occur with  improper specimen collection/handling, submission of specimen other than nasopharyngeal swab, presence of viral mutation(s) within the areas targeted by this assay, and inadequate number of viral copies(<138 copies/mL). A negative result must be combined with clinical observations, patient history, and epidemiological information. The expected result is Negative.  Fact Sheet for Patients:  EntrepreneurPulse.com.au  Fact Sheet for Healthcare Providers:  IncredibleEmployment.be  This test is no t yet approved or cleared by the [place] FDA and  has been authorized for detection and/or diagnosis of SARS-CoV-2 by FDA under an Emergency Use Authorization (EUA). This EUA will remain  in effect (meaning this test can be used) for the duration of the COVID-19 declaration under Section 564(b)(1) of  the Act, 21 U.S.C.section 360bbb-3(b)(1), unless the authorization is terminated  or revoked sooner.       Influenza A by PCR NEGATIVE NEGATIVE Final   Influenza B by PCR NEGATIVE NEGATIVE Final    Comment: (NOTE) The Xpert Xpress SARS-CoV-2/FLU/RSV plus assay is intended as an aid in the diagnosis of influenza from Nasopharyngeal swab specimens and should not be used as a sole basis for treatment. Nasal washings and aspirates are unacceptable for Xpert Xpress SARS-CoV-2/FLU/RSV testing.  Fact Sheet for Patients: EntrepreneurPulse.com.au  Fact Sheet for Healthcare Providers: IncredibleEmployment.be  This test is not yet approved or cleared by the [place] FDA and has been authorized for detection and/or diagnosis of SARS-CoV-2 by FDA under an Emergency Use Authorization (EUA). This EUA will remain in effect (meaning this test can be used) for the duration of the COVID-19 declaration under Section 564(b)(1) of the Act, 21 U.S.C. section 360bbb-3(b)(1), unless the authorization is terminated or revoked.  Performed at [facility], [place] [address] [place], [place] [address]      Time coordinating discharge: 32 minutes  SIGNED:   Barb Merino, MD  [facility] 10/15/2020, 9:08 AM

## 2020-10-15 NOTE — Plan of Care (Signed)
All discharge instructions were given to Pt. Discussed pain management.

## 2020-10-17 ENCOUNTER — Ambulatory Visit: Payer: Medicare Other | Admitting: Nurse Practitioner

## 2020-10-17 NOTE — Progress Notes (Deleted)
     10/17/2020 [name] [id number] [birth year]   Chief Complaint:  History of Present Illness:  [name]. [name] is a 69 year old female with a past medical history of anxiety, hypertension, SIADH, acute diastolic CHF, GERD, Schatzki's ring   She was admitted to the hospital 10/17 -09/14/2020: 69 year old with a history of hiatal hernia, Schatzki's ring, SIADH, and tobacco abuse who was found down at home by her brother after having been seen normal the day prior. EMS found her to be hypoxic in the 60s and clearly dyspneic. Upon arrival in the ED she was intubated and admitted to the ICU with aspiration versus community-acquired pneumonia, acute metabolic encephalopathy, and acute kidney injury.  Hospital Course: 10/17 to ED via EMS-intubated-admit to ICU 10/23 extubated 10/26 transfer to telemetry bed  Multifactorial acute hypoxic and hypercarbic respiratory failure Hypoxia and hypercarbia now resolved- see contributing details listed below - on RA at time of d/c   Aspiration versus community-acquired pneumonia Completed a course of abx tx during this admission   EGD 03/22/2015 by Dr. Hilarie Fredrickson:  Colonoscopy 03/22/2015 by Dr. Hilarie Fredrickson:    Past Medical History:  Diagnosis Date  . Abdominal pain, epigastric   . Acid reflux    Pt was born with acute acid  . AKI (acute kidney injury) ([place]) 09/15/2020  . Anxiety   . GERD (gastroesophageal reflux disease)   . Heart murmur   . Hiatal hernia   . Hypertension   . Hyponatremia 02/10/2020  . Schatzki's ring   . Ulcer    8 years ago       Current Medications, Allergies, Past Medical History, Past Surgical History, Family History and Social History were reviewed in [employer] record.   Review of Systems:   Constitutional: Negative for fever, sweats, chills or weight loss.  Respiratory: Negative for shortness of breath.   Cardiovascular: Negative for chest pain, palpitations and leg swelling.   Gastrointestinal: See HPI.  Musculoskeletal: Negative for back pain or muscle aches.  Neurological: Negative for dizziness, headaches or paresthesias.    Physical Exam: There were no vitals taken for this visit. General: Well developed, w   ***female in no acute distress. Head: Normocephalic and atraumatic. Eyes: No scleral icterus. Conjunctiva pink . Ears: Normal auditory acuity. Mouth: Dentition intact. No ulcers or lesions.  Lungs: Clear throughout to auscultation. Heart: Regular rate and rhythm, no murmur. Abdomen: Soft, nontender and nondistended. No masses or hepatomegaly. Normal bowel sounds x 4 quadrants.  Rectal: *** Musculoskeletal: Symmetrical with no gross deformities. Extremities: No edema. Neurological: Alert oriented x 4. No focal deficits.  Psychological: Alert and cooperative. Normal mood and affect  Assessment and Recommendations: ***

## 2020-10-21 ENCOUNTER — Emergency Department (HOSPITAL_COMMUNITY): Payer: Medicare Other

## 2020-10-21 ENCOUNTER — Other Ambulatory Visit: Payer: Self-pay

## 2020-10-21 ENCOUNTER — Inpatient Hospital Stay (HOSPITAL_COMMUNITY)
Admission: EM | Admit: 2020-10-21 | Discharge: 2020-10-24 | DRG: 070 | Disposition: A | Discharge status: Home / Self Care | Payer: Medicare Other | Attending: Internal Medicine | Admitting: Internal Medicine

## 2020-10-21 ENCOUNTER — Encounter (HOSPITAL_COMMUNITY): Payer: Self-pay

## 2020-10-21 DIAGNOSIS — R059 Cough, unspecified: Secondary | ICD-10-CM

## 2020-10-21 DIAGNOSIS — Z720 Tobacco use: Secondary | ICD-10-CM | POA: Diagnosis present

## 2020-10-21 DIAGNOSIS — S322XXA Fracture of coccyx, initial encounter for closed fracture: Secondary | ICD-10-CM | POA: Diagnosis present

## 2020-10-21 DIAGNOSIS — W19XXXD Unspecified fall, subsequent encounter: Secondary | ICD-10-CM | POA: Diagnosis present

## 2020-10-21 DIAGNOSIS — Z20822 Contact with and (suspected) exposure to covid-19: Secondary | ICD-10-CM | POA: Diagnosis present

## 2020-10-21 DIAGNOSIS — I10 Essential (primary) hypertension: Secondary | ICD-10-CM | POA: Diagnosis present

## 2020-10-21 DIAGNOSIS — G9341 Metabolic encephalopathy: Secondary | ICD-10-CM | POA: Diagnosis not present

## 2020-10-21 DIAGNOSIS — Z91013 Allergy to seafood: Secondary | ICD-10-CM

## 2020-10-21 DIAGNOSIS — R55 Syncope and collapse: Secondary | ICD-10-CM | POA: Diagnosis present

## 2020-10-21 DIAGNOSIS — E8809 Other disorders of plasma-protein metabolism, not elsewhere classified: Secondary | ICD-10-CM | POA: Diagnosis present

## 2020-10-21 DIAGNOSIS — Z888 Allergy status to other drugs, medicaments and biological substances status: Secondary | ICD-10-CM

## 2020-10-21 DIAGNOSIS — E43 Unspecified severe protein-calorie malnutrition: Secondary | ICD-10-CM | POA: Diagnosis present

## 2020-10-21 DIAGNOSIS — Z91048 Other nonmedicinal substance allergy status: Secondary | ICD-10-CM

## 2020-10-21 DIAGNOSIS — Z79891 Long term (current) use of opiate analgesic: Secondary | ICD-10-CM

## 2020-10-21 DIAGNOSIS — F419 Anxiety disorder, unspecified: Secondary | ICD-10-CM | POA: Diagnosis present

## 2020-10-21 DIAGNOSIS — S3210XA Unspecified fracture of sacrum, initial encounter for closed fracture: Secondary | ICD-10-CM | POA: Diagnosis present

## 2020-10-21 DIAGNOSIS — Z818 Family history of other mental and behavioral disorders: Secondary | ICD-10-CM

## 2020-10-21 DIAGNOSIS — Z90710 Acquired absence of both cervix and uterus: Secondary | ICD-10-CM

## 2020-10-21 DIAGNOSIS — R4182 Altered mental status, unspecified: Secondary | ICD-10-CM | POA: Diagnosis not present

## 2020-10-21 DIAGNOSIS — Z96611 Presence of right artificial shoulder joint: Secondary | ICD-10-CM | POA: Diagnosis present

## 2020-10-21 DIAGNOSIS — E440 Moderate protein-calorie malnutrition: Secondary | ICD-10-CM | POA: Diagnosis present

## 2020-10-21 DIAGNOSIS — L89101 Pressure ulcer of unspecified part of back, stage 1: Secondary | ICD-10-CM | POA: Diagnosis present

## 2020-10-21 DIAGNOSIS — Z681 Body mass index (BMI) 19 or less, adult: Secondary | ICD-10-CM

## 2020-10-21 DIAGNOSIS — E785 Hyperlipidemia, unspecified: Secondary | ICD-10-CM | POA: Diagnosis present

## 2020-10-21 DIAGNOSIS — E871 Hypo-osmolality and hyponatremia: Secondary | ICD-10-CM | POA: Diagnosis present

## 2020-10-21 DIAGNOSIS — R410 Disorientation, unspecified: Secondary | ICD-10-CM

## 2020-10-21 DIAGNOSIS — S3210XD Unspecified fracture of sacrum, subsequent encounter for fracture with routine healing: Secondary | ICD-10-CM

## 2020-10-21 DIAGNOSIS — K222 Esophageal obstruction: Secondary | ICD-10-CM | POA: Diagnosis present

## 2020-10-21 DIAGNOSIS — R0602 Shortness of breath: Secondary | ICD-10-CM

## 2020-10-21 DIAGNOSIS — S322XXD Fracture of coccyx, subsequent encounter for fracture with routine healing: Secondary | ICD-10-CM

## 2020-10-21 DIAGNOSIS — Z79899 Other long term (current) drug therapy: Secondary | ICD-10-CM

## 2020-10-21 DIAGNOSIS — Z716 Tobacco abuse counseling: Secondary | ICD-10-CM

## 2020-10-21 DIAGNOSIS — F17210 Nicotine dependence, cigarettes, uncomplicated: Secondary | ICD-10-CM | POA: Diagnosis present

## 2020-10-21 DIAGNOSIS — K449 Diaphragmatic hernia without obstruction or gangrene: Secondary | ICD-10-CM | POA: Diagnosis present

## 2020-10-21 DIAGNOSIS — D72829 Elevated white blood cell count, unspecified: Secondary | ICD-10-CM

## 2020-10-21 DIAGNOSIS — K219 Gastro-esophageal reflux disease without esophagitis: Secondary | ICD-10-CM | POA: Diagnosis present

## 2020-10-21 LAB — CBC WITH DIFFERENTIAL/PLATELET
Abs Immature Granulocytes: 0.07 10*3/uL (ref 0.00–0.07)
Basophils Absolute: 0.1 10*3/uL (ref 0.0–0.1)
Basophils Relative: 1 %
Eosinophils Absolute: 0 10*3/uL (ref 0.0–0.5)
Eosinophils Relative: 0 %
HCT: 26.2 % — ABNORMAL LOW (ref 36.0–46.0)
Hemoglobin: 8.3 g/dL — ABNORMAL LOW (ref 12.0–15.0)
Immature Granulocytes: 1 %
Lymphocytes Relative: 16 %
Lymphs Abs: 2 10*3/uL (ref 0.7–4.0)
MCH: 25.8 pg — ABNORMAL LOW (ref 26.0–34.0)
MCHC: 31.7 g/dL (ref 30.0–36.0)
MCV: 81.4 fL (ref 80.0–100.0)
Monocytes Absolute: 0.7 10*3/uL (ref 0.1–1.0)
Monocytes Relative: 5 %
Neutro Abs: 9.5 10*3/uL — ABNORMAL HIGH (ref 1.7–7.7)
Neutrophils Relative %: 77 %
Platelets: 594 10*3/uL — ABNORMAL HIGH (ref 150–400)
RBC: 3.22 MIL/uL — ABNORMAL LOW (ref 3.87–5.11)
RDW: 15.9 % — ABNORMAL HIGH (ref 11.5–15.5)
WBC: 12.4 10*3/uL — ABNORMAL HIGH (ref 4.0–10.5)
nRBC: 0 % (ref 0.0–0.2)

## 2020-10-21 LAB — URINALYSIS, ROUTINE W REFLEX MICROSCOPIC
Bilirubin Urine: NEGATIVE
Glucose, UA: NEGATIVE mg/dL
Hgb urine dipstick: NEGATIVE
Ketones, ur: 5 mg/dL — AB
Leukocytes,Ua: NEGATIVE
Nitrite: NEGATIVE
Protein, ur: NEGATIVE mg/dL
Specific Gravity, Urine: 1.006 (ref 1.005–1.030)
pH: 7 (ref 5.0–8.0)

## 2020-10-21 LAB — COMPREHENSIVE METABOLIC PANEL
ALT: 14 U/L (ref 0–44)
AST: 22 U/L (ref 15–41)
Albumin: 3.3 g/dL — ABNORMAL LOW (ref 3.5–5.0)
Alkaline Phosphatase: 114 U/L (ref 38–126)
Anion gap: 12 (ref 5–15)
BUN: 15 mg/dL (ref 8–23)
CO2: 25 mmol/L (ref 22–32)
Calcium: 9 mg/dL (ref 8.9–10.3)
Chloride: 97 mmol/L — ABNORMAL LOW (ref 98–111)
Creatinine, Ser: 1.01 mg/dL — ABNORMAL HIGH (ref 0.44–1.00)
GFR, Estimated: >60 mL/min (ref >60)
Glucose, Bld: 92 mg/dL (ref 70–99)
Potassium: 4.4 mmol/L (ref 3.5–5.1)
Sodium: 134 mmol/L — ABNORMAL LOW (ref 135–145)
Total Bilirubin: 0.6 mg/dL (ref 0.3–1.2)
Total Protein: 6 g/dL — ABNORMAL LOW (ref 6.5–8.1)

## 2020-10-21 LAB — AMMONIA: Ammonia: 20 umol/L (ref 9–35)

## 2020-10-21 LAB — RAPID URINE DRUG SCREEN, HOSP PERFORMED
Amphetamines: NOT DETECTED
Barbiturates: NOT DETECTED
Benzodiazepines: NOT DETECTED
Cocaine: NOT DETECTED
Opiates: POSITIVE — AB
Tetrahydrocannabinol: NOT DETECTED

## 2020-10-21 LAB — ETHANOL: Alcohol, Ethyl (B): <10 mg/dL (ref <10)

## 2020-10-21 MED ORDER — SODIUM CHLORIDE 0.9 % IV BOLUS
500.0000 mL | Freq: Once | INTRAVENOUS | Status: AC
  Administered 2020-10-21: 500 mL via INTRAVENOUS

## 2020-10-21 NOTE — ED Notes (Signed)
Patient transported to CT 

## 2020-10-21 NOTE — ED Triage Notes (Signed)
Per EMS, patient from home, worsening AMS today. A&Ox1 to person. Hx chronic hyponatremia. Decreased PO intake and kidney disease.   BP 216/140 HR 88 RR 16 O2 98%  CBG 113

## 2020-10-21 NOTE — H&P (Incomplete)
History and Physical    [name] FIE:[id number] DOB: 12/09/1950 DOA: 10/21/2020  PCP: Nicholes Rough, PA-C (Confirm with patient/family/NH records and if not entered, this has to be entered at [facility] point of entry) Patient coming from: ***  I have personally briefly reviewed patient's old medical records in [place]  Chief Complaint: ***  HPI: [name] is a 69 y.o. female with medical history significant of *** (For level 3, the HPI must include 4+ descriptors: Location, Quality, Severity, Duration, Timing, Context, modifying factors, associated signs/symptoms and/or status of 3+ chronic problems.)  (Please avoid self-populating past medical history here) (The initial 2-3 lines should be focused and good to copy and paste in the HPI section of the daily progress note).  ED Course: ***  Review of Systems: As per HPI otherwise all other systems reviewed and are negative. Unacceptable ROS statements: "10 systems reviewed," "Extensive" (without elaboration).  Acceptable ROS statements: "All others negative," "All others reviewed and are negative," and "All others unremarkable," with at [place] documented Can't double dip - if using for HPI can't use for ROS  Past Medical History:  Diagnosis Date  . Abdominal pain, epigastric   . Acid reflux    Pt was born with acute acid  . AKI (acute kidney injury) ([place]) 09/15/2020  . Anxiety   . GERD (gastroesophageal reflux disease)   . Heart murmur   . Hiatal hernia   . Hypertension   . Hyponatremia 02/10/2020  . Schatzki's ring   . Ulcer    8 years ago    Past Surgical History:  Procedure Laterality Date  . ABDOMINAL HYSTERECTOMY     complete for fibroids  . KNEE ARTHROSCOPY     right knee  . shoulder repalcement     right  . TONSILLECTOMY AND ADENOIDECTOMY      Social History  reports that she has been smoking cigarettes. She has been smoking about 0.50 packs per day. She has never used smokeless tobacco.  She reports that she does not drink alcohol and does not use drugs.  Allergies  Allergen Reactions  . Iodine Shortness Of Breath and Rash  . Thiazide-Type Diuretics Other (See Comments)    Severe hyponatremia July 2021  . Shellfish Allergy     Causes rash    Family History  Problem Relation Age of Onset  . Prostate cancer Father   . Anxiety disorder Mother   . Colon cancer Maternal Grandmother    *** Unacceptable: Noncontributory, unremarkable, or negative. Acceptable: (example)Family history negative for heart disease  Prior to Admission medications   Medication Sig Start Date End Date Taking? Authorizing Provider  alum & mag hydroxide-simeth (MAALOX/MYLANTA) 200-200-20 MG/5ML suspension Take 30 mLs by mouth every 4 (four) hours as needed for indigestion or heartburn. 09/14/20   Cherene Altes, MD  baclofen (LIORESAL) 10 MG tablet Take 10 mg by mouth 4 (four) times daily as needed for muscle spasms.    [provider]  famotidine (PEPCID) 20 MG tablet Take 1 tablet (20 mg total) by mouth daily. Patient taking differently: Take 20 mg by mouth daily as needed for heartburn.  10/04/20   Medina-Vargas, Monina C, NP  furosemide (LASIX) 20 MG tablet Take 1 tablet (20 mg total) by mouth daily. 10/04/20   Medina-Vargas, Monina C, NP  gabapentin (NEURONTIN) 100 MG capsule Take 1 capsule (100 mg total) by mouth 2 (two) times daily. 10/04/20   Medina-Vargas, Monina C, NP  lidocaine (LIDODERM)  5 % Place 1 patch onto the skin daily for 15 days. Remove & Discard patch within 12 hours or as directed by MD 10/15/20 10/30/20  Barb Merino, MD  metoprolol tartrate (LOPRESSOR) 25 MG tablet Take 1 tablet (25 mg total) by mouth 2 (two) times daily. 10/04/20   Medina-Vargas, Monina C, NP  pantoprazole (PROTONIX) 40 MG tablet Take 1 tablet (40 mg total) by mouth 2 (two) times daily. Patient taking differently: Take 80 mg by mouth daily.  10/04/20   Medina-Vargas, Monina C, NP  potassium  chloride 20 MEQ/15ML (10%) SOLN Take 15 mLs (20 mEq total) by mouth daily. 10/04/20   Medina-Vargas, Monina C, NP  sodium chloride (OCEAN) 0.65 % nasal spray Place 2 sprays into the nose in the morning, at noon, and at bedtime.    [provider]  traMADol (ULTRAM) 50 MG tablet Take 1 tablet (50 mg total) by mouth every 8 (eight) hours as needed for severe pain. Patient taking differently: Take 50 mg by mouth every 8 (eight) hours as needed for moderate pain or severe pain.  10/04/20   Medina-Vargas, Monina C, NP  traZODone (DESYREL) 50 MG tablet Take 50 mg by mouth at bedtime.  10/07/20   [provider]    Physical Exam: Vitals:   10/21/20 2145 10/21/20 2200 10/21/20 2215 10/21/20 2315  BP: 118/71 134/80 136/80 (!) 180/105  Pulse: 60 (!) 45 60 84  Resp: 15 (!) 21 12 14   Temp:      TempSrc:      SpO2: 93% 95% 93% 96%  Weight:      Height:        Constitutional: NAD, calm, comfortable Vitals:   10/21/20 2145 10/21/20 2200 10/21/20 2215 10/21/20 2315  BP: 118/71 134/80 136/80 (!) 180/105  Pulse: 60 (!) 45 60 84  Resp: 15 (!) 21 12 14   Temp:      TempSrc:      SpO2: 93% 95% 93% 96%  Weight:      Height:       Eyes: PERRL, lids and conjunctivae normal ENMT: Mucous membranes are moist. Posterior pharynx clear of any exudate or lesions.Normal dentition.  Neck: normal, supple, no masses, no thyromegaly Respiratory: clear to auscultation bilaterally, no wheezing, no crackles. Normal respiratory effort. No accessory muscle use.  Cardiovascular: Regular rate and rhythm, no murmurs / rubs / gallops. No extremity edema. 2+ pedal pulses. No carotid bruits.  Abdomen: no tenderness, no masses palpated. No hepatosplenomegaly. Bowel sounds positive.  Musculoskeletal: no clubbing / cyanosis. No joint deformity upper and lower extremities. Good ROM, no contractures. Normal muscle tone.  Skin: no rashes, lesions, ulcers. No induration Neurologic: CN 2-12 grossly intact.  Sensation intact, DTR normal. Strength 5/5 in all 4.  Psychiatric: Normal judgment and insight. Alert and oriented x 3. Normal mood.   (Anything < 9 systems with 2 bullets each down codes to level 1) (If patient refuses exam can't bill higher level) (Make sure to document decubitus ulcers present on admission -- if possible -- and whether patient has chronic indwelling catheter at time of admission)  Labs on Admission: I have personally reviewed following labs and imaging studies  CBC: Recent Labs  Lab 10/21/20 2022  WBC 12.4*  NEUTROABS 9.5*  HGB 8.3*  HCT 26.2*  MCV 81.4  PLT 594*    Basic Metabolic Panel: Recent Labs  Lab 10/21/20 2022  NA 134*  K 4.4  CL 97*  CO2 25  GLUCOSE 92  BUN  15  CREATININE 1.01*  CALCIUM 9.0    GFR: Estimated Creatinine Clearance: 31.3 mL/min (A) (by C-G formula based on SCr of 1.01 mg/dL (H)).  Liver Function Tests: Recent Labs  Lab 10/21/20 2022  AST 22  ALT 14  ALKPHOS 114  BILITOT 0.6  PROT 6.0*  ALBUMIN 3.3*    Urine analysis:    Component Value Date/Time   COLORURINE STRAW (A) 10/21/2020 1949   APPEARANCEUR HAZY (A) 10/21/2020 1949   LABSPEC 1.006 10/21/2020 1949   PHURINE 7.0 10/21/2020 1949   GLUCOSEU NEGATIVE 10/21/2020 1949   HGBUR NEGATIVE 10/21/2020 1949   BILIRUBINUR NEGATIVE 10/21/2020 1949   KETONESUR 5 (A) 10/21/2020 1949   PROTEINUR NEGATIVE 10/21/2020 1949   UROBILINOGEN 0.2 05/25/2011 1337   NITRITE NEGATIVE 10/21/2020 1949   LEUKOCYTESUR NEGATIVE 10/21/2020 1949    Radiological Exams on Admission: CT Head Wo Contrast  Result Date: 10/21/2020 CLINICAL DATA:  Worsening altered mental status. EXAM: CT HEAD WITHOUT CONTRAST TECHNIQUE: Contiguous axial images were obtained from the base of the skull through the vertex without intravenous contrast. COMPARISON:  CT head 08/29/2020. FINDINGS: Brain: Patchy and confluent areas of decreased attenuation are noted throughout the deep and periventricular white  matter of the cerebral hemispheres bilaterally, compatible with chronic microvascular ischemic disease. No evidence of large-territorial acute infarction. No parenchymal hemorrhage. No mass lesion. No extra-axial collection. No mass effect or midline shift. No hydrocephalus. Basilar cisterns are patent. Vascular: No hyperdense vessel. Atherosclerotic calcifications are present within the cavernous internal carotid and vertebral arteries. Skull: No acute fracture or focal lesion. Sinuses/Orbits: Paranasal sinuses and mastoid air cells are clear. The orbits are unremarkable. Other: None. IMPRESSION: No acute intracranial abnormality. Electronically Signed   By: Iven Finn M.D.   On: 10/21/2020 22:47    EKG: Independently reviewed.   Assessment/Plan Principal Problem:   AMS (altered mental status) Active Problems:   Hypertension   GERD (gastroesophageal reflux disease)   Tobacco use   Hyperlipidemia   Hyponatremia   Malnutrition of severe degree   Sacrum and coccyx fracture (HCC)   Hypoalbuminemia    DVT prophylaxis: *** (Lovenox/Heparin/SCD's/anticoagulated/None (if comfort care) Code Status:   *** (Full/Partial (specify details) Family Communication:  *** (Specify name, relationship. Do not write "discussed with patient". Specify tel # if discussed over the phone) Disposition Plan:   Patient is from:  ***  Anticipated DC to:  ***  Anticipated DC date:  ***  Anticipated DC barriers: ***  Consults called:  *** (with names) Admission status:  *** (inpatient / obs / tele / medical floor / SDU)  Severity of Illness: {Observation/Inpatient:21159}    Reubin Milan MD [facility]  How to contact the [facility] Attending or Consulting provider [place] or covering provider during after hours 7P -7A, for this patient?   1. Check the care team in [facility] and look for a) attending/consulting [facility] provider listed and b) the [facility] team listed 2. Log into [email or web address] and use [facility]'s universal password to access. If you do not have the password, please contact the hospital operator. 3. Locate the [facility] provider you are looking for under [facility] and page to a number that you can be directly reached. 4. If you still have difficulty reaching the provider, please page the [facility] (Director on Call) for the Hospitalists listed on amion for assistance.  10/21/2020, 11:47 PM

## 2020-10-21 NOTE — ED Provider Notes (Signed)
[facility] DEPT Provider Note   CSN: [id number] Arrival date & time: 10/21/20  1824     History Chief Complaint  Patient presents with  . Nausea  . Emesis    [name] is a 69 y.o. female.  HPI   69 year old female past medical history of GERD, HTN, ascites, AKI, hyponatremia presents to the emergency department with concern for altered mental status.  Patient is admittedly confused, history limited secondary to this, no family at bedside.  Patient claims that she lives at home with her brother.  She says that he called EMS today because he was concerned that she was confused.  She admits that she is confused.  She states that this started happening last night, it was gradual.  She is complaining of headache, episodes of vomiting and general malaise.  Past Medical History:  Diagnosis Date  . Abdominal pain, epigastric   . Acid reflux    Pt was born with acute acid  . AKI (acute kidney injury) ([place]) 09/15/2020  . Anxiety   . GERD (gastroesophageal reflux disease)   . Heart murmur   . Hiatal hernia   . Hypertension   . Hyponatremia 02/10/2020  . Schatzki's ring   . Ulcer    8 years ago    Patient Active Problem List   Diagnosis Date Noted  . Sacrum and coccyx fracture ([place]) 10/13/2020  . Neurocognitive deficits 09/20/2020  . AKI (acute kidney injury) ([place]) 09/15/2020  . CHF (congestive heart failure) ([place])   . Hiatal hernia   . Malnutrition of severe degree 08/29/2020  . Respiratory failure ([place])   . Community acquired pneumonia of right lower lobe of lung   . Somnolence   . Failure to thrive in adult   . Generalized weakness 05/19/2020  . Hypokalemia 05/19/2020  . Hyponatremia 02/03/2020  . Ambulatory dysfunction   . Prediabetes 03/23/2013  . Hyperlipidemia 03/23/2013  . Colon cancer screening 11/18/2012  . Hypertension 09/16/2012  . GERD (gastroesophageal reflux disease) 09/16/2012  . Tobacco use 09/16/2012    Past  Surgical History:  Procedure Laterality Date  . ABDOMINAL HYSTERECTOMY     complete for fibroids  . KNEE ARTHROSCOPY     right knee  . shoulder repalcement     right  . TONSILLECTOMY AND ADENOIDECTOMY       OB History   No obstetric history on file.     Family History  Problem Relation Age of Onset  . Prostate cancer Father   . Anxiety disorder Mother   . Colon cancer Maternal Grandmother     Social History   Tobacco Use  . Smoking status: Current Every Day Smoker    Packs/day: 0.50    Types: Cigarettes  . Smokeless tobacco: Never Used  . Tobacco comment: almost a pack a day;   Vaping Use  . Vaping Use: Never used  Substance Use Topics  . Alcohol use: No    Alcohol/week: 0.0 standard drinks  . Drug use: No    Home Medications Prior to Admission medications   Medication Sig Start Date End Date Taking? Authorizing Provider  alum & mag hydroxide-simeth (MAALOX/MYLANTA) 200-200-20 MG/5ML suspension Take 30 mLs by mouth every 4 (four) hours as needed for indigestion or heartburn. 09/14/20   Cherene Altes, MD  baclofen (LIORESAL) 10 MG tablet Take 10 mg by mouth 4 (four) times daily as needed for muscle spasms.    [provider]  famotidine (PEPCID) 20 MG tablet  Take 1 tablet (20 mg total) by mouth daily. Patient taking differently: Take 20 mg by mouth daily as needed for heartburn.  10/04/20   Medina-Vargas, Monina C, NP  furosemide (LASIX) 20 MG tablet Take 1 tablet (20 mg total) by mouth daily. 10/04/20   Medina-Vargas, Monina C, NP  gabapentin (NEURONTIN) 100 MG capsule Take 1 capsule (100 mg total) by mouth 2 (two) times daily. 10/04/20   Medina-Vargas, Monina C, NP  lidocaine (LIDODERM) 5 % Place 1 patch onto the skin daily for 15 days. Remove & Discard patch within 12 hours or as directed by MD 10/15/20 10/30/20  Barb Merino, MD  metoprolol tartrate (LOPRESSOR) 25 MG tablet Take 1 tablet (25 mg total) by mouth 2 (two) times daily. 10/04/20    Medina-Vargas, Monina C, NP  pantoprazole (PROTONIX) 40 MG tablet Take 1 tablet (40 mg total) by mouth 2 (two) times daily. Patient taking differently: Take 80 mg by mouth daily.  10/04/20   Medina-Vargas, Monina C, NP  potassium chloride 20 MEQ/15ML (10%) SOLN Take 15 mLs (20 mEq total) by mouth daily. 10/04/20   Medina-Vargas, Monina C, NP  sodium chloride (OCEAN) 0.65 % nasal spray Place 2 sprays into the nose in the morning, at noon, and at bedtime.    [provider]  traMADol (ULTRAM) 50 MG tablet Take 1 tablet (50 mg total) by mouth every 8 (eight) hours as needed for severe pain. Patient taking differently: Take 50 mg by mouth every 8 (eight) hours as needed for moderate pain or severe pain.  10/04/20   Medina-Vargas, Monina C, NP  traZODone (DESYREL) 50 MG tablet Take 50 mg by mouth at bedtime.  10/07/20   [provider]    Allergies    Iodine, Thiazide-type diuretics, and Shellfish allergy  Review of Systems   Review of Systems  Reason unable to perform ROS: Limited secondary to confusion.  Gastrointestinal: Positive for nausea and vomiting.  Neurological: Positive for headaches.  Psychiatric/Behavioral: Positive for confusion.    Physical Exam Updated Vital Signs BP (!) 159/87   Pulse 90   Temp 98.3 F (36.8 C) (Oral)   Resp 16   Ht 5\' 2"  (1.575 m)   Wt 36.7 kg   SpO2 96%   BMI 14.82 kg/m   Physical Exam Vitals and nursing note reviewed.  Constitutional:      Appearance: Normal appearance.  HENT:     Head: Normocephalic.     Mouth/Throat:     Mouth: Mucous membranes are moist.  Eyes:     Extraocular Movements: Extraocular movements intact.     Pupils: Pupils are equal, round, and reactive to light.     Comments: Patient appears to have intact vision in both eyes  Cardiovascular:     Rate and Rhythm: Normal rate.  Pulmonary:     Effort: Pulmonary effort is normal. No respiratory distress.  Abdominal:     General: There is no distension.      Palpations: Abdomen is soft.     Tenderness: There is no abdominal tenderness. There is no guarding.  Skin:    General: Skin is warm.  Neurological:     Mental Status: She is alert.     Cranial Nerves: No cranial nerve deficit.     Motor: No weakness.     Comments: Oriented to self and birthdate.     ED Results / Procedures / Treatments   Labs (all labs ordered are listed, but only abnormal results are displayed)  Labs Reviewed  CBC WITH DIFFERENTIAL/PLATELET - Abnormal; Notable for the following components:      Result Value   WBC 12.4 (*)    RBC 3.22 (*)    Hemoglobin 8.3 (*)    HCT 26.2 (*)    MCH 25.8 (*)    RDW 15.9 (*)    Platelets 594 (*)    Neutro Abs 9.5 (*)    All other components within normal limits  COMPREHENSIVE METABOLIC PANEL - Abnormal; Notable for the following components:   Sodium 134 (*)    Chloride 97 (*)    Creatinine, Ser 1.01 (*)    Total Protein 6.0 (*)    Albumin 3.3 (*)    All other components within normal limits  ETHANOL  URINALYSIS, ROUTINE W REFLEX MICROSCOPIC  RAPID URINE DRUG SCREEN, HOSP PERFORMED  AMMONIA    EKG None  Radiology No results found.  Procedures Procedures (including critical care time)  Medications Ordered in ED Medications - No data to display  ED Course  I have reviewed the triage vital signs and the nursing notes.  Pertinent labs & imaging results that were available during my care of the patient were reviewed by me and considered in my medical decision making (see chart for details).  Clinical Course as of 10/21/20 2331  Fri Oct 21, 2020  2328 Vital signs are stable.  Patient sodium is stable, she is not uremic, ammonia level is normal, ethanol is negative.  We are pending urinalysis and urine drug screen.  Head CT shows no large infarct or acute finding.  On reevaluation patient's confusion and neuro status remains unchanged.  Plan to admit for continued evaluation and further studying. [KH]     Clinical Course User Index [KH] Zymire Turnbo, Alvin Critchley, DO   MDM Rules/Calculators/A&P                          69 year old female presents the emergency department with reported confusion that started gradually last night.  She is a poor historian secondary to confusion, no family at bedside.  Currently complaining of headache and nausea with episodes of vomiting.  Was able to get a hold of the patient's brother.  He states that around 9 PM last night is when the patient was at her baseline.  She was recently seen in the hospital for a fall where she sustained a sacral fracture, she was discharged on pain medicine.  He states that she has been taking it more frequent than prescribed.  He says when she woke up this morning around 9 AM is when he found her in her room, disheveled, confused.  She was unable to dress herself.  She had physical therapy this morning and after that seemed to improve and was able to hold a conversation but then declined again.  He states that she went into a chair, was unable to get up, was unable to follow commands, urinated on herself.  The only other thing that he noted was that she was coughing a lot this morning in the kitchen, almost like she was choking.  Work-up thus far has not revealed a cause for her acute mental status change.  We are pending urinalysis but due to her confusion she will require admission regardless.  Rule out CVA is in the differential the urinalysis is negative. Final Clinical Impression(s) / ED Diagnoses Final diagnoses:  None    Rx / DC Orders ED Discharge Orders  None       Lorelle Gibbs, DO 10/21/20 2333

## 2020-10-21 NOTE — H&P (Signed)
History and Physical    [name] STM:[id number] DOB: 03-06-1951 DOA: 10/21/2020  PCP: Nicholes Rough, PA-C  Patient coming from: Home.  I have personally briefly reviewed patient's old medical records in [place]  Chief Complaint: Altered mental status.  HPI: [name] is a 69 y.o. female with medical history significant of epigastric nominal pain, history of AKI, anxiety, GERD/hiatal hernia, Schatzki's ring, PUD, heart murmur, hypertension, hyponatremia who was brought via EMS to the emergency department after she called EMS earlier in the day concerned because she has been confused and knows that she is confused.  Per patient's brother, this seems to have started on Thursday evening.  The father thinks that she has been overusing her opiate medications for recent sacrum and coccyx fracture (see previous H&P and discharge summary).  She apparently may have had some vomiting at home.  She has not eaten much since Thursday.  She is able to answer some simple questions, but unable to add further information to the HPI.  ED Course: Initial vital signs were temperature 98.3 F, pulse 86, respirations 17, BP 142/121 mmHg and O2 sat 100% on room air.     Urinalysis was hazy in appearance with ketonuria 5 mg/dL, but was otherwise unremarkable.  UDS positive for opiates.  CBC shows white count 12.4, hemoglobin 8.3 g/dL and platelets 594.  Ammonia and alcohol level were normal.  CMP shows a sodium 134 and chloride of 97 mmol/L.  Total protein 6.0 and albumin 3.3 g/dL.  The rest of the CMP values are unremarkable.  Imaging: CT head without contrast showed chronic microvascular ischemic disease of the periventricular white matter, but no acute intracranial normality.  Please see images and full regular report for further detail.  Review of Systems: As per HPI otherwise all other systems reviewed and are negative.  Past Medical History:  Diagnosis Date  . Abdominal pain, epigastric    . Acid reflux    Pt was born with acute acid  . AKI (acute kidney injury) ([place]) 09/15/2020  . Anxiety   . GERD (gastroesophageal reflux disease)   . Heart murmur   . Hiatal hernia   . Hypertension   . Hyponatremia 02/10/2020  . Schatzki's ring   . Ulcer    8 years ago   Past Surgical History:  Procedure Laterality Date  . ABDOMINAL HYSTERECTOMY     complete for fibroids  . KNEE ARTHROSCOPY     right knee  . shoulder repalcement     right  . TONSILLECTOMY AND ADENOIDECTOMY     Social History  reports that she has been smoking cigarettes. She has been smoking about 0.50 packs per day. She has never used smokeless tobacco. She reports that she does not drink alcohol and does not use drugs.  Allergies  Allergen Reactions  . Iodine Shortness Of Breath and Rash  . Thiazide-Type Diuretics Other (See Comments)    Severe hyponatremia July 2021  . Shellfish Allergy     Causes rash   Family History  Problem Relation Age of Onset  . Prostate cancer Father   . Anxiety disorder Mother   . Colon cancer Maternal Grandmother    Prior to Admission medications   Medication Sig Start Date End Date Taking? Authorizing Provider  alum & mag hydroxide-simeth (MAALOX/MYLANTA) 200-200-20 MG/5ML suspension Take 30 mLs by mouth every 4 (four) hours as needed for indigestion or heartburn. 09/14/20   Cherene Altes, MD  baclofen (LIORESAL) 10 MG tablet  Take 10 mg by mouth 4 (four) times daily as needed for muscle spasms.    [provider]  famotidine (PEPCID) 20 MG tablet Take 1 tablet (20 mg total) by mouth daily. Patient taking differently: Take 20 mg by mouth daily as needed for heartburn.  10/04/20   Medina-Vargas, Monina C, NP  furosemide (LASIX) 20 MG tablet Take 1 tablet (20 mg total) by mouth daily. 10/04/20   Medina-Vargas, Monina C, NP  gabapentin (NEURONTIN) 100 MG capsule Take 1 capsule (100 mg total) by mouth 2 (two) times daily. 10/04/20   Medina-Vargas, Monina C, NP   lidocaine (LIDODERM) 5 % Place 1 patch onto the skin daily for 15 days. Remove & Discard patch within 12 hours or as directed by MD 10/15/20 10/30/20  Barb Merino, MD  metoprolol tartrate (LOPRESSOR) 25 MG tablet Take 1 tablet (25 mg total) by mouth 2 (two) times daily. 10/04/20   Medina-Vargas, Monina C, NP  pantoprazole (PROTONIX) 40 MG tablet Take 1 tablet (40 mg total) by mouth 2 (two) times daily. Patient taking differently: Take 80 mg by mouth daily.  10/04/20   Medina-Vargas, Monina C, NP  potassium chloride 20 MEQ/15ML (10%) SOLN Take 15 mLs (20 mEq total) by mouth daily. 10/04/20   Medina-Vargas, Monina C, NP  sodium chloride (OCEAN) 0.65 % nasal spray Place 2 sprays into the nose in the morning, at noon, and at bedtime.    [provider]  traMADol (ULTRAM) 50 MG tablet Take 1 tablet (50 mg total) by mouth every 8 (eight) hours as needed for severe pain. Patient taking differently: Take 50 mg by mouth every 8 (eight) hours as needed for moderate pain or severe pain.  10/04/20   Medina-Vargas, Monina C, NP  traZODone (DESYREL) 50 MG tablet Take 50 mg by mouth at bedtime.  10/07/20   [provider]   Physical Exam: Vitals:   10/21/20 2145 10/21/20 2200 10/21/20 2215 10/21/20 2315  BP: 118/71 134/80 136/80 (!) 180/105  Pulse: 60 (!) 45 60 84  Resp: 15 (!) 21 12 14   Temp:      TempSrc:      SpO2: 93% 95% 93% 96%  Weight:      Height:       Constitutional: Undernourished, looks chronically ill. Eyes: PERRL, lids and conjunctivae normal ENMT: Mucous membranes are mildly dry. Posterior pharynx clear of any exudate or lesions. Neck: normal, supple, no masses, no thyromegaly Respiratory: clear to auscultation bilaterally, no wheezing, no crackles. Normal respiratory effort. No accessory muscle use.  Cardiovascular: Regular rate and rhythm, no murmurs / rubs / gallops.  2+ lower extremity bilateral pitting edema. 2+ pedal pulses. No carotid bruits.  Abdomen: Soft,  no tenderness, no masses palpated. No hepatosplenomegaly. Bowel sounds positive.  Musculoskeletal: Generalized weakness.  No clubbing / cyanosis. Good ROM, no contractures. Normal muscle tone.  Skin: Stage I pressure ulcers on mid back.  Multiple ecchymoses and excoriations on bilateral pretibial area left > right.  Please see pictures below. Neurologic: CN 2-12 grossly intact. Sensation intact, DTR normal. Strength 4 /5 in all 4.  Psychiatric: Alert and oriented x 1, partially oriented to place, disoriented to time, date and situation.  Restless.           Labs on Admission: I have personally reviewed following labs and imaging studies  CBC: Recent Labs  Lab 10/21/20 2022  WBC 12.4*  NEUTROABS 9.5*  HGB 8.3*  HCT 26.2*  MCV 81.4  PLT 594*  Basic Metabolic Panel: Recent Labs  Lab 10/21/20 2022  NA 134*  K 4.4  CL 97*  CO2 25  GLUCOSE 92  BUN 15  CREATININE 1.01*  CALCIUM 9.0    GFR: Estimated Creatinine Clearance: 31.3 mL/min (A) (by C-G formula based on SCr of 1.01 mg/dL (H)).  Liver Function Tests: Recent Labs  Lab 10/21/20 2022  AST 22  ALT 14  ALKPHOS 114  BILITOT 0.6  PROT 6.0*  ALBUMIN 3.3*    Urine analysis:    Component Value Date/Time   COLORURINE STRAW (A) 10/21/2020 1949   APPEARANCEUR HAZY (A) 10/21/2020 1949   LABSPEC 1.006 10/21/2020 1949   PHURINE 7.0 10/21/2020 1949   GLUCOSEU NEGATIVE 10/21/2020 1949   HGBUR NEGATIVE 10/21/2020 1949   BILIRUBINUR NEGATIVE 10/21/2020 1949   KETONESUR 5 (A) 10/21/2020 1949   PROTEINUR NEGATIVE 10/21/2020 1949   UROBILINOGEN 0.2 05/25/2011 1337   NITRITE NEGATIVE 10/21/2020 1949   LEUKOCYTESUR NEGATIVE 10/21/2020 1949    Radiological Exams on Admission: CT Head Wo Contrast  Result Date: 10/21/2020 CLINICAL DATA:  Worsening altered mental status. EXAM: CT HEAD WITHOUT CONTRAST TECHNIQUE: Contiguous axial images were obtained from the base of the skull through the vertex without intravenous  contrast. COMPARISON:  CT head 08/29/2020. FINDINGS: Brain: Patchy and confluent areas of decreased attenuation are noted throughout the deep and periventricular white matter of the cerebral hemispheres bilaterally, compatible with chronic microvascular ischemic disease. No evidence of large-territorial acute infarction. No parenchymal hemorrhage. No mass lesion. No extra-axial collection. No mass effect or midline shift. No hydrocephalus. Basilar cisterns are patent. Vascular: No hyperdense vessel. Atherosclerotic calcifications are present within the cavernous internal carotid and vertebral arteries. Skull: No acute fracture or focal lesion. Sinuses/Orbits: Paranasal sinuses and mastoid air cells are clear. The orbits are unremarkable. Other: None. IMPRESSION: No acute intracranial abnormality. Electronically Signed   By: Iven Finn M.D.   On: 10/21/2020 22:47   09/08/2020 echocardiogram complete IMPRESSIONS:  1. Left ventricular ejection fraction, by estimation, is 55%. The left  ventricle has normal function. The left ventricle demonstrates regional  wall motion abnormalities with basal inferior and basal to mid  inferolateral akinesis. Left ventricular  diastolic parameters are consistent with Grade I diastolic dysfunction  (impaired relaxation).  2. Right ventricular systolic function is normal. The right ventricular  size is normal. Tricuspid regurgitation signal is inadequate for assessing  PA pressure.  3. Left atrial size was moderately dilated.  4. The mitral valve is normal in structure. Trivial mitral valve  regurgitation. No evidence of mitral stenosis. Moderate mitral annular  calcification.  5. The aortic valve was not well visualized. Aortic valve regurgitation  is not visualized. No aortic stenosis is present.  6. The inferior vena cava is normal in size with greater than 50%  respiratory variability, suggesting right atrial pressure of 3 mmHg.  7. Technically  difficult study with poor acoustic windows.   EKG: Independently reviewed. Vent. rate 83 BPM PR interval * ms QRS duration 86 ms QT/QTc 374/440 ms P-R-T axes 69 73 49 Sinus rhythm Probable left atrial enlargement Probable left ventricular hypertrophy  Assessment/Plan Principal Problem:   AMS (altered mental status)   Acute metabolic encephalopathy Opiate induced? Observation/telemetry. Neurochecks every 4 hours. Hold baclofen and tramadol. Check MRI of brain in a.m.  Active Problems:   Sacrum and coccyx fracture (HCC) No NSAIDs due to GERD/hiatal hernia/PUD. Low-dose oxycodone as needed. Continue gabapentin 100 mg p.o. 2 times daily.    Hypertension Continue  metoprolol 25 mg p.o. twice daily. Continue furosemide 20 mg p.o. daily. Monitor BP, HR, renal function electrolytes.    GERD (gastroesophageal reflux disease) Continue pantoprazole 40 mg p.o. twice daily. Continue Mylanta as needed.    Tobacco use Quit smoking recently.    Hyperlipidemia Not on statin. Having significant malnutrition.    Hyponatremia At baseline. On furosemide. Monitor sodium level.    Malnutrition of severe degree   Hypoalbuminemia Protein supplementation.   Consult nutritional services.   DVT prophylaxis: Lovenox SQ. Code Status:   Full code. Family Communication: Disposition Plan:   Patient is from:  Home.  Anticipated DC to:  Home.  Anticipated DC date:  10/22/2020 or 10/23/2020.  Anticipated DC barriers: Clinical status/pending MRI scan.  Consults called:  TOC team. Admission status:  Observation/telemetry.  High due to acute AMS without clear etiology and persistent confusion.  She will need to remain in the hospital for further evaluation of encephalopathy.  Severity of Illness:  Reubin Milan MD [facility]  How to contact the [facility] Attending or Consulting provider [place] or covering provider during after hours [place], for this patient?   1. Check the  care team in [facility] and look for a) attending/consulting [facility] provider listed and b) the [facility] team listed 2. Log into [email or web address] and use [place]'s universal password to access. If you do not have the password, please contact the hospital operator. 3. Locate the [facility] provider you are looking for under [facility] and page to a number that you can be directly reached. 4. If you still have difficulty reaching the provider, please page the [facility] (Director on Call) for the Hospitalists listed on amion for assistance.  10/21/2020, 11:47 PM   This document was prepared using Dragon voice recognition software and may contain some unintended transcription errors.

## 2020-10-22 ENCOUNTER — Observation Stay (HOSPITAL_COMMUNITY): Payer: Medicare Other

## 2020-10-22 ENCOUNTER — Other Ambulatory Visit: Payer: Self-pay

## 2020-10-22 DIAGNOSIS — Z681 Body mass index (BMI) 19 or less, adult: Secondary | ICD-10-CM | POA: Diagnosis not present

## 2020-10-22 DIAGNOSIS — L89101 Pressure ulcer of unspecified part of back, stage 1: Secondary | ICD-10-CM | POA: Diagnosis present

## 2020-10-22 DIAGNOSIS — F17210 Nicotine dependence, cigarettes, uncomplicated: Secondary | ICD-10-CM | POA: Diagnosis present

## 2020-10-22 DIAGNOSIS — Z91048 Other nonmedicinal substance allergy status: Secondary | ICD-10-CM | POA: Diagnosis not present

## 2020-10-22 DIAGNOSIS — Z90710 Acquired absence of both cervix and uterus: Secondary | ICD-10-CM | POA: Diagnosis not present

## 2020-10-22 DIAGNOSIS — S322XXD Fracture of coccyx, subsequent encounter for fracture with routine healing: Secondary | ICD-10-CM | POA: Diagnosis not present

## 2020-10-22 DIAGNOSIS — E785 Hyperlipidemia, unspecified: Secondary | ICD-10-CM | POA: Diagnosis present

## 2020-10-22 DIAGNOSIS — R059 Cough, unspecified: Secondary | ICD-10-CM | POA: Diagnosis not present

## 2020-10-22 DIAGNOSIS — R0602 Shortness of breath: Secondary | ICD-10-CM | POA: Diagnosis not present

## 2020-10-22 DIAGNOSIS — E871 Hypo-osmolality and hyponatremia: Secondary | ICD-10-CM | POA: Diagnosis present

## 2020-10-22 DIAGNOSIS — K222 Esophageal obstruction: Secondary | ICD-10-CM | POA: Diagnosis present

## 2020-10-22 DIAGNOSIS — Z79891 Long term (current) use of opiate analgesic: Secondary | ICD-10-CM | POA: Diagnosis not present

## 2020-10-22 DIAGNOSIS — G9341 Metabolic encephalopathy: Secondary | ICD-10-CM | POA: Diagnosis present

## 2020-10-22 DIAGNOSIS — Z79899 Other long term (current) drug therapy: Secondary | ICD-10-CM | POA: Diagnosis not present

## 2020-10-22 DIAGNOSIS — I10 Essential (primary) hypertension: Secondary | ICD-10-CM

## 2020-10-22 DIAGNOSIS — R55 Syncope and collapse: Secondary | ICD-10-CM

## 2020-10-22 DIAGNOSIS — S3210XD Unspecified fracture of sacrum, subsequent encounter for fracture with routine healing: Secondary | ICD-10-CM | POA: Diagnosis not present

## 2020-10-22 DIAGNOSIS — K219 Gastro-esophageal reflux disease without esophagitis: Secondary | ICD-10-CM | POA: Diagnosis present

## 2020-10-22 DIAGNOSIS — Z888 Allergy status to other drugs, medicaments and biological substances status: Secondary | ICD-10-CM | POA: Diagnosis not present

## 2020-10-22 DIAGNOSIS — Z720 Tobacco use: Secondary | ICD-10-CM

## 2020-10-22 DIAGNOSIS — R4182 Altered mental status, unspecified: Secondary | ICD-10-CM | POA: Diagnosis present

## 2020-10-22 DIAGNOSIS — E43 Unspecified severe protein-calorie malnutrition: Secondary | ICD-10-CM | POA: Diagnosis present

## 2020-10-22 DIAGNOSIS — W19XXXD Unspecified fall, subsequent encounter: Secondary | ICD-10-CM | POA: Diagnosis present

## 2020-10-22 DIAGNOSIS — Z91013 Allergy to seafood: Secondary | ICD-10-CM | POA: Diagnosis not present

## 2020-10-22 DIAGNOSIS — Z20822 Contact with and (suspected) exposure to covid-19: Secondary | ICD-10-CM | POA: Diagnosis present

## 2020-10-22 DIAGNOSIS — K449 Diaphragmatic hernia without obstruction or gangrene: Secondary | ICD-10-CM | POA: Diagnosis present

## 2020-10-22 DIAGNOSIS — Z818 Family history of other mental and behavioral disorders: Secondary | ICD-10-CM | POA: Diagnosis not present

## 2020-10-22 DIAGNOSIS — Z716 Tobacco abuse counseling: Secondary | ICD-10-CM | POA: Diagnosis not present

## 2020-10-22 DIAGNOSIS — Z96611 Presence of right artificial shoulder joint: Secondary | ICD-10-CM | POA: Diagnosis present

## 2020-10-22 DIAGNOSIS — F419 Anxiety disorder, unspecified: Secondary | ICD-10-CM | POA: Diagnosis present

## 2020-10-22 LAB — BASIC METABOLIC PANEL
Anion gap: 11 (ref 5–15)
BUN: 12 mg/dL (ref 8–23)
CO2: 25 mmol/L (ref 22–32)
Calcium: 8.7 mg/dL — ABNORMAL LOW (ref 8.9–10.3)
Chloride: 102 mmol/L (ref 98–111)
Creatinine, Ser: 0.83 mg/dL (ref 0.44–1.00)
GFR, Estimated: >60 mL/min (ref >60)
Glucose, Bld: 82 mg/dL (ref 70–99)
Potassium: 3.9 mmol/L (ref 3.5–5.1)
Sodium: 138 mmol/L (ref 135–145)

## 2020-10-22 LAB — CBC
HCT: 25.5 % — ABNORMAL LOW (ref 36.0–46.0)
Hemoglobin: 8.2 g/dL — ABNORMAL LOW (ref 12.0–15.0)
MCH: 25.7 pg — ABNORMAL LOW (ref 26.0–34.0)
MCHC: 32.2 g/dL (ref 30.0–36.0)
MCV: 79.9 fL — ABNORMAL LOW (ref 80.0–100.0)
Platelets: 392 10*3/uL (ref 150–400)
RBC: 3.19 MIL/uL — ABNORMAL LOW (ref 3.87–5.11)
RDW: 15.9 % — ABNORMAL HIGH (ref 11.5–15.5)
WBC: 10.2 10*3/uL (ref 4.0–10.5)
nRBC: 0 % (ref 0.0–0.2)

## 2020-10-22 LAB — RESP PANEL BY RT-PCR (FLU A&B, COVID) ARPGX2
Influenza A by PCR: NEGATIVE
Influenza B by PCR: NEGATIVE
SARS Coronavirus 2 by RT PCR: NEGATIVE

## 2020-10-22 MED ORDER — PANTOPRAZOLE SODIUM 40 MG PO TBEC
40.0000 mg | DELAYED_RELEASE_TABLET | Freq: Two times a day (BID) | ORAL | Status: DC
  Administered 2020-10-22 – 2020-10-24 (×5): 40 mg via ORAL
  Filled 2020-10-22 (×5): qty 1

## 2020-10-22 MED ORDER — PROCHLORPERAZINE EDISYLATE 10 MG/2ML IJ SOLN: 5.0000 mg | Freq: Four times a day (QID) | INTRAMUSCULAR | Status: DC | PRN

## 2020-10-22 MED ORDER — OXYCODONE-ACETAMINOPHEN 5-325 MG PO TABS
0.5000 | ORAL_TABLET | ORAL | Status: DC | PRN
  Administered 2020-10-22 – 2020-10-24 (×6): 0.5 via ORAL
  Filled 2020-10-22 (×6): qty 1

## 2020-10-22 MED ORDER — GABAPENTIN 100 MG PO CAPS
100.0000 mg | ORAL_CAPSULE | Freq: Two times a day (BID) | ORAL | Status: DC
  Administered 2020-10-22 – 2020-10-24 (×5): 100 mg via ORAL
  Filled 2020-10-22 (×5): qty 1

## 2020-10-22 MED ORDER — FUROSEMIDE 20 MG PO TABS
20.0000 mg | ORAL_TABLET | Freq: Every day | ORAL | Status: DC
  Administered 2020-10-22 – 2020-10-24 (×3): 20 mg via ORAL
  Filled 2020-10-22 (×3): qty 1

## 2020-10-22 MED ORDER — ACETAMINOPHEN 650 MG RE SUPP: 650.0000 mg | Freq: Four times a day (QID) | RECTAL | Status: DC | PRN

## 2020-10-22 MED ORDER — POTASSIUM CHLORIDE 20 MEQ PO PACK
20.0000 meq | PACK | Freq: Every day | ORAL | Status: DC
  Administered 2020-10-22 – 2020-10-24 (×3): 20 meq via ORAL
  Filled 2020-10-22 (×3): qty 1

## 2020-10-22 MED ORDER — LIDOCAINE 5 % EX PTCH: 1.0000 | MEDICATED_PATCH | CUTANEOUS | Status: DC

## 2020-10-22 MED ORDER — ENOXAPARIN SODIUM 30 MG/0.3ML ~~LOC~~ SOLN
30.0000 mg | SUBCUTANEOUS | Status: DC
  Administered 2020-10-22 – 2020-10-24 (×3): 30 mg via SUBCUTANEOUS
  Filled 2020-10-22 (×3): qty 0.3

## 2020-10-22 MED ORDER — LORAZEPAM 2 MG/ML IJ SOLN
0.5000 mg | INTRAMUSCULAR | Status: AC | PRN
  Administered 2020-10-22: 10:00:00 0.5 mg via INTRAVENOUS
  Filled 2020-10-22: qty 1

## 2020-10-22 MED ORDER — METOPROLOL TARTRATE 25 MG PO TABS
25.0000 mg | ORAL_TABLET | Freq: Two times a day (BID) | ORAL | Status: DC
  Administered 2020-10-22 – 2020-10-24 (×5): 25 mg via ORAL
  Filled 2020-10-22 (×5): qty 1

## 2020-10-22 MED ORDER — ACETAMINOPHEN 325 MG PO TABS
650.0000 mg | ORAL_TABLET | Freq: Four times a day (QID) | ORAL | Status: DC | PRN
  Administered 2020-10-22: 650 mg via ORAL
  Filled 2020-10-22: qty 2

## 2020-10-22 MED ORDER — DIPHENHYDRAMINE HCL 50 MG/ML IJ SOLN
12.5000 mg | Freq: Once | INTRAMUSCULAR | Status: AC
  Administered 2020-10-22: 12.5 mg via INTRAVENOUS
  Filled 2020-10-22: qty 1

## 2020-10-22 MED ORDER — LORAZEPAM 2 MG/ML IJ SOLN
0.5000 mg | Freq: Once | INTRAMUSCULAR | Status: AC | PRN
  Administered 2020-10-22: 0.5 mg via INTRAVENOUS
  Filled 2020-10-22: qty 1

## 2020-10-22 MED ORDER — TRAZODONE HCL 50 MG PO TABS
50.0000 mg | ORAL_TABLET | Freq: Every day | ORAL | Status: DC
  Administered 2020-10-22 – 2020-10-23 (×2): 50 mg via ORAL
  Filled 2020-10-22 (×2): qty 1

## 2020-10-22 MED ORDER — LIDOCAINE 5 % EX PTCH
1.0000 | MEDICATED_PATCH | CUTANEOUS | Status: DC
  Administered 2020-10-22 – 2020-10-23 (×2): 1 via TRANSDERMAL
  Filled 2020-10-22 (×3): qty 1

## 2020-10-22 MED ORDER — ALUM & MAG HYDROXIDE-SIMETH 200-200-20 MG/5ML PO SUSP
30.0000 mL | ORAL | Status: DC | PRN
  Administered 2020-10-23 – 2020-10-24 (×2): 30 mL via ORAL
  Filled 2020-10-22 (×2): qty 30

## 2020-10-22 NOTE — ED Notes (Signed)
Pt. Urine canister was emptied. Nurse aware.

## 2020-10-22 NOTE — Progress Notes (Addendum)
[facility]  PROGRESS NOTE  [name] WLS:[id number] DOB: Sep 17, 1951 DOA: 10/21/2020 PCP: Nicholes Rough, PA-C Admit date - 10/21/2020   Admitting Physician Reubin Milan, MD  Outpatient Primary MD for the patient is Nicholes Rough, PA-C  LOS - 0 Brief Narrative  Ms. [name] is a 69 year old female with medical history significant for PUD/GERD, HTN, chronic hyponatremia and recent hospitalization from 12/2-12/4 for nondisplaced uncomplicated left sacral alla fracture after a fall for which patient was started on oral opioids and local lidocaine patches as well as home health physical therapy.  She presented on 12/10 due to concerns from her brother of increased confusion as well as decreased oral intake.  In the ED she was afebrile, hemodynamically stable WBC 12.4 platelets 594 Covid test negative ammonia 20 sodium 134 creatinine 1.01 alcohol less than 10 UA unremarkable UDS positive for opioids.  Patient underwent CT head which showed no acute abnormalities.  Patient was brought in as observation for work-up of altered mental status.  Subjective  Today she has no complaints. States she remembers falling from her wheelchair  A & P    Acute metabolic encephalopathy. Likely related to polypharmacy. Increased confusion, odd behavior and disorientation.   Brother reports patient has been taking her opioid pain medicines more frequently than prescribed.  She has no appreciable focal deficits this a.m., is alert to self, place and context but still having some odd behavior. Per report not consistent with baseline.   Other metabolic work-up has been unremarkable so far -Currently awaiting MRI brain for complete work-up -Continue reduced version of home opioid regimen -Continue monitor neurologic status --Check Vit B12  Syncope and Reported falls.  Patient states she does remember being in a wheelchair before falling.  She has multiple age bruises on lower legs.  After recent  hospitalization home health therapy have been recommended.  No focal deficits in lower extremities on examination -PT eval --orthostatic vitals  Sacrum and coccyx fracture, present on admission, stable -Continue gabapentin 100 mg twice daily, lidocaine patch, low-dose oxycodone/acetaminophen every 3 hours moderate pain -Holding home baclofen  HTN, elevated BP 168/87 -Monitor may need to make adjustments after receiving her home morning metoprolol 25 mg twice daily -Continue Lasix 20 mg daily  GERD, stable-continue PPI  Tobacco use She reports quitting in the past 90 days  Cough.  Occasionally productive of phlegm.  Remains afebrile.  Had leukocytosis on admission but likely due to stress from fall.  If persists are noted O2 may warrant chest x-ray -Continue to closely monitor    Family Communication  : None plan to call brother today  Code Status : Full code as discussed on day of admission  Disposition Plan  :  Patient is from home. Anticipated d/c date: 2 to 3 days. Barriers to d/c or necessity for inpatient status: Inpatient status given mental status not at baseline, working up syncope Consults  :  none  Procedures  :  MRI Brain  DVT Prophylaxis  :  Lovenox   MDM: The below labs and imaging reports were reviewed and summarized above.  Medication management as above.  Lab Results  Component Value Date   PLT 392 10/22/2020    Diet :  Diet Order            Diet Heart Room service appropriate? Yes; Fluid consistency: Thin  Diet effective now                  Inpatient Medications Scheduled Meds: .  enoxaparin (LOVENOX) injection  30 mg Subcutaneous Q24H  . furosemide  20 mg Oral Daily  . gabapentin  100 mg Oral BID  . lidocaine  1 patch Transdermal Q24H  . metoprolol tartrate  25 mg Oral BID  . pantoprazole  40 mg Oral BID  . potassium chloride  20 mEq Oral Daily  . traZODone  50 mg Oral QHS   Continuous Infusions: PRN Meds:.acetaminophen **OR**  acetaminophen, alum & mag hydroxide-simeth, LORazepam, oxyCODONE-acetaminophen, prochlorperazine  Antibiotics  :   Anti-infectives (From admission, onward)   None       Objective   Vitals:   10/22/20 0800 10/22/20 0830 10/22/20 0928 10/22/20 0930  BP: (!) 147/83 (!) 144/75 (!) 175/85 (!) 168/87  Pulse: 79 81 85 82  Resp: 16 18  16   Temp:      TempSrc:      SpO2: 95% 92%  94%  Weight:      Height:        SpO2: 94 %  Wt Readings from Last 3 Encounters:  10/21/20 36.7 kg  10/15/20 39.2 kg  10/03/20 39.9 kg     Intake/Output Summary (Last 24 hours) at 10/22/2020 0957 Last data filed at 10/22/2020 0535 Gross per 24 hour  Intake 500 ml  Output --  Net 500 ml    Physical Exam:     Awake Alert, Oriented to self, place, context, no distress. Having trouble remembering events before admission.  Anxious No new F.N deficits, strength 5 out of 5 in both upper and lower extremities [place].AT, Normal respiratory effort on room air, CTAB RRR, SEM loudest in right ICS +ve B.Sounds, Abd Soft, No tenderness, No rebound, guarding or rigidity. Scattered multiple H bruises on bilateral lower extremities   I have personally reviewed the following:   Data Reviewed:  CBC Recent Labs  Lab 10/21/20 2022 10/22/20 0450  WBC 12.4* 10.2  HGB 8.3* 8.2*  HCT 26.2* 25.5*  PLT 594* 392  MCV 81.4 79.9*  MCH 25.8* 25.7*  MCHC 31.7 32.2  RDW 15.9* 15.9*  LYMPHSABS 2.0  --   MONOABS 0.7  --   EOSABS 0.0  --   BASOSABS 0.1  --     Chemistries  Recent Labs  Lab 10/21/20 2022 10/22/20 0450  NA 134* 138  K 4.4 3.9  CL 97* 102  CO2 25 25  GLUCOSE 92 82  BUN 15 12  CREATININE 1.01* 0.83  CALCIUM 9.0 8.7*  AST 22  --   ALT 14  --   ALKPHOS 114  --   BILITOT 0.6  --    ------------------------------------------------------------------------------------------------------------------ No results for input(s): CHOL, HDL, LDLCALC, TRIG, CHOLHDL, LDLDIRECT in the last 72  hours.  Lab Results  Component Value Date   HGBA1C 5.2 08/28/2020   ------------------------------------------------------------------------------------------------------------------ No results for input(s): TSH, T4TOTAL, T3FREE, THYROIDAB in the last 72 hours.  Invalid input(s): FREET3 ------------------------------------------------------------------------------------------------------------------ No results for input(s): VITAMINB12, FOLATE, FERRITIN, TIBC, IRON, RETICCTPCT in the last 72 hours.  Coagulation profile No results for input(s): INR, PROTIME in the last 168 hours.  No results for input(s): DDIMER in the last 72 hours.  Cardiac Enzymes No results for input(s): CKMB, TROPONINI, MYOGLOBIN in the last 168 hours.  Invalid input(s): CK ------------------------------------------------------------------------------------------------------------------    Component Value Date/Time   BNP 499.0 (H) 09/11/2020 1194    Micro Results Recent Results (from the past 240 hour(s))  Resp Panel by RT-PCR (Flu A&B, Covid) Nasopharyngeal Swab     Status: None  Collection Time: 10/13/20  3:15 PM   Specimen: Nasopharyngeal Swab; Nasopharyngeal(NP) swabs in vial transport medium  Result Value Ref Range Status   SARS Coronavirus 2 by RT PCR NEGATIVE NEGATIVE Final    Comment: (NOTE) SARS-CoV-2 target nucleic acids are NOT DETECTED.  The SARS-CoV-2 RNA is generally detectable in upper respiratory specimens during the acute phase of infection. The lowest concentration of SARS-CoV-2 viral copies this assay can detect is 138 copies/mL. A negative result does not preclude SARS-Cov-2 infection and should not be used as the sole basis for treatment or other patient management decisions. A negative result may occur with  improper specimen collection/handling, submission of specimen other than nasopharyngeal swab, presence of viral mutation(s) within the areas targeted by this assay, and  inadequate number of viral copies(<138 copies/mL). A negative result must be combined with clinical observations, patient history, and epidemiological information. The expected result is Negative.  Fact Sheet for Patients:  EntrepreneurPulse.com.au  Fact Sheet for Healthcare Providers:  IncredibleEmployment.be  This test is no t yet approved or cleared by the [place] FDA and  has been authorized for detection and/or diagnosis of SARS-CoV-2 by FDA under an Emergency Use Authorization (EUA). This EUA will remain  in effect (meaning this test can be used) for the duration of the COVID-19 declaration under Section 564(b)(1) of the Act, 21 U.S.C.section 360bbb-3(b)(1), unless the authorization is terminated  or revoked sooner.       Influenza A by PCR NEGATIVE NEGATIVE Final   Influenza B by PCR NEGATIVE NEGATIVE Final    Comment: (NOTE) The Xpert Xpress SARS-CoV-2/FLU/RSV plus assay is intended as an aid in the diagnosis of influenza from Nasopharyngeal swab specimens and should not be used as a sole basis for treatment. Nasal washings and aspirates are unacceptable for Xpert Xpress SARS-CoV-2/FLU/RSV testing.  Fact Sheet for Patients: EntrepreneurPulse.com.au  Fact Sheet for Healthcare Providers: IncredibleEmployment.be  This test is not yet approved or cleared by the [place] FDA and has been authorized for detection and/or diagnosis of SARS-CoV-2 by FDA under an Emergency Use Authorization (EUA). This EUA will remain in effect (meaning this test can be used) for the duration of the COVID-19 declaration under Section 564(b)(1) of the Act, 21 U.S.C. section 360bbb-3(b)(1), unless the authorization is terminated or revoked.  Performed at [facility], [place] [address] [place], [place] [address]   Resp Panel by RT-PCR (Flu A&B, Covid) Nasopharyngeal Swab     Status: None    Collection Time: 10/21/20 11:23 PM   Specimen: Nasopharyngeal Swab; Nasopharyngeal(NP) swabs in vial transport medium  Result Value Ref Range Status   SARS Coronavirus 2 by RT PCR NEGATIVE NEGATIVE Final    Comment: (NOTE) SARS-CoV-2 target nucleic acids are NOT DETECTED.  The SARS-CoV-2 RNA is generally detectable in upper respiratory specimens during the acute phase of infection. The lowest concentration of SARS-CoV-2 viral copies this assay can detect is 138 copies/mL. A negative result does not preclude SARS-Cov-2 infection and should not be used as the sole basis for treatment or other patient management decisions. A negative result may occur with  improper specimen collection/handling, submission of specimen other than nasopharyngeal swab, presence of viral mutation(s) within the areas targeted by this assay, and inadequate number of viral copies(<138 copies/mL). A negative result must be combined with clinical observations, patient history, and epidemiological information. The expected result is Negative.  Fact Sheet for Patients:  EntrepreneurPulse.com.au  Fact Sheet for Healthcare Providers:  IncredibleEmployment.be  This test is  no t yet approved or cleared by the [place] and  has been authorized for detection and/or diagnosis of SARS-CoV-2 by FDA under an Emergency Use Authorization (EUA). This EUA will remain  in effect (meaning this test can be used) for the duration of the COVID-19 declaration under Section 564(b)(1) of the Act, 21 U.S.C.section 360bbb-3(b)(1), unless the authorization is terminated  or revoked sooner.       Influenza A by PCR NEGATIVE NEGATIVE Final   Influenza B by PCR NEGATIVE NEGATIVE Final    Comment: (NOTE) The Xpert Xpress SARS-CoV-2/FLU/RSV plus assay is intended as an aid in the diagnosis of influenza from Nasopharyngeal swab specimens and should not be used as a sole basis for treatment.  Nasal washings and aspirates are unacceptable for Xpert Xpress SARS-CoV-2/FLU/RSV testing.  Fact Sheet for Patients: EntrepreneurPulse.com.au  Fact Sheet for Healthcare Providers: IncredibleEmployment.be  This test is not yet approved or cleared by the [place] FDA and has been authorized for detection and/or diagnosis of SARS-CoV-2 by FDA under an Emergency Use Authorization (EUA). This EUA will remain in effect (meaning this test can be used) for the duration of the COVID-19 declaration under Section 564(b)(1) of the Act, 21 U.S.C. section 360bbb-3(b)(1), unless the authorization is terminated or revoked.  Performed at [facility], [place] [address], [place], [place] [address]     Radiology Reports DG Lumbar Spine Complete  Result Date: 10/13/2020 CLINICAL DATA:  Low back pain after fall last night. EXAM: LUMBAR SPINE - COMPLETE 4+ VIEW COMPARISON:  None. FINDINGS: Diffuse osteopenia is noted. No fracture or spondylolisthesis is noted. Mild degenerative disc disease is noted at L1-2 and L2-3. Moderate degenerative disc disease is noted at L5-S1. IMPRESSION: Multilevel degenerative disc disease. No acute abnormality seen in the lumbar spine. Electronically Signed   By: Marijo Conception M.D.   On: 10/13/2020 12:03   CT Head Wo Contrast  Result Date: 10/21/2020 CLINICAL DATA:  Worsening altered mental status. EXAM: CT HEAD WITHOUT CONTRAST TECHNIQUE: Contiguous axial images were obtained from the base of the skull through the vertex without intravenous contrast. COMPARISON:  CT head 08/29/2020. FINDINGS: Brain: Patchy and confluent areas of decreased attenuation are noted throughout the deep and periventricular white matter of the cerebral hemispheres bilaterally, compatible with chronic microvascular ischemic disease. No evidence of large-territorial acute infarction. No parenchymal hemorrhage. No mass lesion. No extra-axial  collection. No mass effect or midline shift. No hydrocephalus. Basilar cisterns are patent. Vascular: No hyperdense vessel. Atherosclerotic calcifications are present within the cavernous internal carotid and vertebral arteries. Skull: No acute fracture or focal lesion. Sinuses/Orbits: Paranasal sinuses and mastoid air cells are clear. The orbits are unremarkable. Other: None. IMPRESSION: No acute intracranial abnormality. Electronically Signed   By: Iven Finn M.D.   On: 10/21/2020 22:47   CT PELVIS WO CONTRAST  Result Date: 10/13/2020 CLINICAL DATA:  Patient status post fall last night which she was knocked down by a [place]. Initial encounter. EXAM: CT PELVIS WITHOUT CONTRAST TECHNIQUE: Multidetector CT imaging of the pelvis was performed following the standard protocol without intravenous contrast. COMPARISON:  Plain films of the lumbar spine and left hip this same day. FINDINGS: Bones/Joint/Cartilage The patient has an acute, nondisplaced fracture of the left sacrum. No other fracture is identified. The hips are located. Bones are osteopenic. Hip joint spaces are preserved. Mild degenerative change at the symphysis pubis and about the SI joints noted. No lytic or sclerotic lesion. Ligaments Suboptimally assessed by  CT. Muscles and Tendons Negative.  No acute or focal abnormality. Soft tissues Imaged intrapelvic contents demonstrate aortic atherosclerosis. The patient is status post hysterectomy. IMPRESSION: Acute, nondisplaced left sacral ala fracture. No other acute abnormality is identified. Osteopenia. Aortic Atherosclerosis (ICD10-I70.0). Electronically Signed   By: Inge Rise M.D.   On: 10/13/2020 13:04   DG Shoulder Left  Result Date: 10/13/2020 CLINICAL DATA:  69 year old female with fall EXAM: LEFT SHOULDER - 2+ VIEW COMPARISON:  None. FINDINGS: No acute displaced fracture. Glenohumeral joint appears congruent. Degenerative changes at the [facility] joint. IMPRESSION: Negative for  acute bony abnormality Electronically Signed   By: Corrie Mckusick D.O.   On: 10/13/2020 12:01   DG Hip Unilat W or Wo Pelvis 2-3 Views Left  Result Date: 10/13/2020 CLINICAL DATA:  Left hip pain after fall last night. EXAM: DG HIP (WITH OR WITHOUT PELVIS) 2-3V LEFT COMPARISON:  None. FINDINGS: There is no evidence of hip fracture or dislocation. There is no evidence of arthropathy or other focal bone abnormality. IMPRESSION: Negative. Electronically Signed   By: Marijo Conception M.D.   On: 10/13/2020 12:02     Time Spent in minutes  30     Desiree Hane M.D on 10/22/2020 at 9:57 AM  To page go to [email or web address] - password [email or web address]

## 2020-10-22 NOTE — ED Notes (Signed)
Pt.was given a applesauce with a soda. Nurse aware.

## 2020-10-22 NOTE — ED Notes (Signed)
Patient received breakfast tray 

## 2020-10-22 NOTE — Progress Notes (Signed)
7 of 11 series sent to pacs for Brain MRI without contrast. Pt terminated exam. Pt clearly stated: Get me out of here I cant do anymore of this thing: Clinical education was provided to the pt who does seem confused about importance of finishing exam. Pt refused any further imaging. Pt was pre-medicated prior to coming to MRI.

## 2020-10-23 ENCOUNTER — Inpatient Hospital Stay (HOSPITAL_COMMUNITY): Payer: Medicare Other

## 2020-10-23 DIAGNOSIS — R059 Cough, unspecified: Secondary | ICD-10-CM

## 2020-10-23 DIAGNOSIS — J189 Pneumonia, unspecified organism: Secondary | ICD-10-CM

## 2020-10-23 DIAGNOSIS — R0602 Shortness of breath: Secondary | ICD-10-CM

## 2020-10-23 LAB — COMPREHENSIVE METABOLIC PANEL
ALT: 12 U/L (ref 0–44)
AST: 20 U/L (ref 15–41)
Albumin: 2.9 g/dL — ABNORMAL LOW (ref 3.5–5.0)
Alkaline Phosphatase: 106 U/L (ref 38–126)
Anion gap: 11 (ref 5–15)
BUN: 11 mg/dL (ref 8–23)
CO2: 29 mmol/L (ref 22–32)
Calcium: 8.7 mg/dL — ABNORMAL LOW (ref 8.9–10.3)
Chloride: 96 mmol/L — ABNORMAL LOW (ref 98–111)
Creatinine, Ser: 0.84 mg/dL (ref 0.44–1.00)
GFR, Estimated: >60 mL/min (ref >60)
Glucose, Bld: 97 mg/dL (ref 70–99)
Potassium: 3.5 mmol/L (ref 3.5–5.1)
Sodium: 136 mmol/L (ref 135–145)
Total Bilirubin: 0.3 mg/dL (ref 0.3–1.2)
Total Protein: 5.6 g/dL — ABNORMAL LOW (ref 6.5–8.1)

## 2020-10-23 LAB — CBC
HCT: 27 % — ABNORMAL LOW (ref 36.0–46.0)
Hemoglobin: 8.6 g/dL — ABNORMAL LOW (ref 12.0–15.0)
MCH: 25.4 pg — ABNORMAL LOW (ref 26.0–34.0)
MCHC: 31.9 g/dL (ref 30.0–36.0)
MCV: 79.6 fL — ABNORMAL LOW (ref 80.0–100.0)
Platelets: 642 10*3/uL — ABNORMAL HIGH (ref 150–400)
RBC: 3.39 MIL/uL — ABNORMAL LOW (ref 3.87–5.11)
RDW: 16.1 % — ABNORMAL HIGH (ref 11.5–15.5)
WBC: 12.7 10*3/uL — ABNORMAL HIGH (ref 4.0–10.5)
nRBC: 0 % (ref 0.0–0.2)

## 2020-10-23 LAB — IRON AND TIBC
Iron: 12 ug/dL — ABNORMAL LOW (ref 28–170)
Saturation Ratios: 3 % — ABNORMAL LOW (ref 10.4–31.8)
TIBC: 381 ug/dL (ref 250–450)
UIBC: 369 ug/dL

## 2020-10-23 LAB — FERRITIN: Ferritin: 18 ng/mL (ref 11–307)

## 2020-10-23 LAB — VITAMIN B12: Vitamin B-12: 281 pg/mL (ref 180–914)

## 2020-10-23 LAB — FOLATE: Folate: 11.3 ng/mL (ref >5.9)

## 2020-10-23 MED ORDER — AZITHROMYCIN 250 MG PO TABS
250.0000 mg | ORAL_TABLET | Freq: Every day | ORAL | Status: DC
  Administered 2020-10-24: 10:00:00 250 mg via ORAL
  Filled 2020-10-23: qty 1

## 2020-10-23 MED ORDER — SODIUM CHLORIDE 0.9 % IV SOLN
1.0000 g | INTRAVENOUS | Status: DC
  Administered 2020-10-23 – 2020-10-24 (×2): 1 g via INTRAVENOUS
  Filled 2020-10-23 (×2): qty 1

## 2020-10-23 MED ORDER — AZITHROMYCIN 250 MG PO TABS
500.0000 mg | ORAL_TABLET | Freq: Every day | ORAL | Status: AC
  Administered 2020-10-23: 11:00:00 500 mg via ORAL
  Filled 2020-10-23: qty 2

## 2020-10-23 NOTE — Progress Notes (Signed)
[facility]  PROGRESS NOTE  [name] LFY:[id number] DOB: [birth year] DOA: 10/21/2020 PCP: Nicholes Rough, PA-C Admit date - 10/21/2020   Admitting Physician Desiree Hane, MD  Outpatient Primary MD for the patient is Nicholes Rough, PA-C  LOS - 1 Brief Narrative  [name] is a 69 year old female with medical history significant for PUD/GERD, HTN, chronic hyponatremia and recent hospitalization from 12/2-12/4 for nondisplaced uncomplicated left sacral alla fracture after a fall for which patient was started on oral opioids and local lidocaine patches as well as home health physical therapy.  She presented on 12/10 due to concerns from her brother of increased confusion as well as decreased oral intake.  In the ED she was afebrile, hemodynamically stable WBC 12.4 platelets 594 Covid test negative ammonia 20 sodium 134 creatinine 1.01 alcohol less than 10 UA unremarkable UDS positive for opioids.  Patient underwent CT head which showed no acute abnormalities.  Patient was brought in as observation for work-up of altered mental status.  Subjective  Today she has no complaints. States she remembers falling from her wheelchair  A & P   Productive cough with some noted dyspnea, concerning for likely community-acquired pneumonia.  T-max 99.2 overnight, white count initially resolved up to 12.7.  Diminished breath sounds on exam.  Still doing well on room air.  Does have smoking history -Obtain chest x-ray -start ceftriaxone and azithromycin -Sputum culture  Acute metabolic encephalopathy, resolved. Likely related to polypharmacy.  Alert oriented x4.  Mental status seems back to baseline.  Patient admits that she may have taken more pain control than needed after working with physical therapy at home.   Other metabolic work-up has been unremarkable so far.  MRI showed no acute pathology -Continue reduced version of home opioid regimen -Continue monitor neurologic status  Syncope and  Reported falls.  Patient states she does remember being in a wheelchair before falling.  She has multiple aged bruises on lower legs.  After recent hospitalization home health therapy have been recommended.  No focal deficits in lower extremities on examination -PT eval --orthostatic vitals  Sacrum and coccyx fracture, present on admission, stable -Continue gabapentin 100 mg twice daily, lidocaine patch, low-dose oxycodone/acetaminophen every 3 hours moderate pain -Holding home baclofen  HTN, better control but still not at goal. 153/92 -home  metoprolol 25 mg twice daily --await orthostatic vitals before adding any new BP meds -Continue Lasix 20 mg daily  GERD, stable -continue PPI  Tobacco use She reports quitting in the past 90 days      Family Communication  : Called brother and left message on 12/11.  Code Status : Full code as discussed on day of admission  Disposition Plan  :  Patient is from home. Anticipated d/c date:  1-2 days. Barriers to d/c or necessity for inpatient status: Antibiotic started for CAP, close monitor respiratory status Consults  :  none  Procedures  :    DVT Prophylaxis  :  Lovenox   MDM: The below labs and imaging reports were reviewed and summarized above.  Medication management as above.  Lab Results  Component Value Date   PLT 642 (H) 10/23/2020    Diet :  Diet Order            Diet regular Room service appropriate? Yes; Fluid consistency: Thin  Diet effective now                  Inpatient Medications Scheduled Meds:  enoxaparin (LOVENOX) injection  30 mg Subcutaneous Q24H   furosemide  20 mg Oral Daily   gabapentin  100 mg Oral BID   lidocaine  1 patch Transdermal Q24H   metoprolol tartrate  25 mg Oral BID   pantoprazole  40 mg Oral BID   potassium chloride  20 mEq Oral Daily   traZODone  50 mg Oral QHS   Continuous Infusions: PRN Meds:.acetaminophen **OR** acetaminophen, alum & mag hydroxide-simeth,  oxyCODONE-acetaminophen, prochlorperazine  Antibiotics  :   Anti-infectives (From admission, onward)   None       Objective   Vitals:   10/22/20 1821 10/22/20 2158 10/23/20 0239 10/23/20 0557  BP: 139/84 (!) 148/79 (!) 146/87 (!) 153/92  Pulse: 89 84 68 71  Resp: 17 (!) 22 18 18   Temp: 98.6 F (37 C) 98.7 F (37.1 C) 97.7 F (36.5 C) 99.2 F (37.3 C)  TempSrc: Oral Oral Oral Oral  SpO2: 93% 96% 91% 98%  Weight:      Height:        SpO2: 98 %  Wt Readings from Last 3 Encounters:  10/21/20 36.7 kg  10/15/20 39.2 kg  10/03/20 39.9 kg     Intake/Output Summary (Last 24 hours) at 10/23/2020 8527 Last data filed at 10/23/2020 0800 Gross per 24 hour  Intake 240 ml  Output --  Net 240 ml    Physical Exam:     Awake Alert, Oriented to self, place, context, no distress.  Normal affect no new F.N deficits, strength 5 out of 5 in both upper and lower extremities [place].AT, Normal respiratory effort on room air, diminished breath sounds bilaterally RRR, SEM loudest in right ICS +ve B.Sounds, Abd Soft, No tenderness, No rebound, guarding or rigidity. Scattered multiple aged bruises on bilateral lower extremities   I have personally reviewed the following:   Data Reviewed:  CBC Recent Labs  Lab 10/21/20 2022 10/22/20 0450 10/23/20 0453  WBC 12.4* 10.2 12.7*  HGB 8.3* 8.2* 8.6*  HCT 26.2* 25.5* 27.0*  PLT 594* 392 642*  MCV 81.4 79.9* 79.6*  MCH 25.8* 25.7* 25.4*  MCHC 31.7 32.2 31.9  RDW 15.9* 15.9* 16.1*  LYMPHSABS 2.0  --   --   MONOABS 0.7  --   --   EOSABS 0.0  --   --   BASOSABS 0.1  --   --     Chemistries  Recent Labs  Lab 10/21/20 2022 10/22/20 0450 10/23/20 0453  NA 134* 138 136  K 4.4 3.9 3.5  CL 97* 102 96*  CO2 25 25 29   GLUCOSE 92 82 97  BUN 15 12 11   CREATININE 1.01* 0.83 0.84  CALCIUM 9.0 8.7* 8.7*  AST 22  --  20  ALT 14  --  12  ALKPHOS 114  --  106  BILITOT 0.6  --  0.3    ------------------------------------------------------------------------------------------------------------------ No results for input(s): CHOL, HDL, LDLCALC, TRIG, CHOLHDL, LDLDIRECT in the last 72 hours.  Lab Results  Component Value Date   HGBA1C 5.2 08/28/2020   ------------------------------------------------------------------------------------------------------------------ No results for input(s): TSH, T4TOTAL, T3FREE, THYROIDAB in the last 72 hours.  Invalid input(s): FREET3 ------------------------------------------------------------------------------------------------------------------ Recent Labs    10/23/20 0453  VITAMINB12 281  FOLATE 11.3    Coagulation profile No results for input(s): INR, PROTIME in the last 168 hours.  No results for input(s): DDIMER in the last 72 hours.  Cardiac Enzymes No results for input(s): CKMB, TROPONINI, MYOGLOBIN in the last 168 hours.  Invalid input(s): CK ------------------------------------------------------------------------------------------------------------------  Component Value Date/Time   BNP 499.0 (H) 09/11/2020 3212    Micro Results Recent Results (from the past 240 hour(s))  Resp Panel by RT-PCR (Flu A&B, Covid) Nasopharyngeal Swab     Status: None   Collection Time: 10/13/20  3:15 PM   Specimen: Nasopharyngeal Swab; Nasopharyngeal(NP) swabs in vial transport medium  Result Value Ref Range Status   SARS Coronavirus 2 by RT PCR NEGATIVE NEGATIVE Final    Comment: (NOTE) SARS-CoV-2 target nucleic acids are NOT DETECTED.  The SARS-CoV-2 RNA is generally detectable in upper respiratory specimens during the acute phase of infection. The lowest concentration of SARS-CoV-2 viral copies this assay can detect is 138 copies/mL. A negative result does not preclude SARS-Cov-2 infection and should not be used as the sole basis for treatment or other patient management decisions. A negative result may occur with   improper specimen collection/handling, submission of specimen other than nasopharyngeal swab, presence of viral mutation(s) within the areas targeted by this assay, and inadequate number of viral copies(<138 copies/mL). A negative result must be combined with clinical observations, patient history, and epidemiological information. The expected result is Negative.  Fact Sheet for Patients:  EntrepreneurPulse.com.au  Fact Sheet for Healthcare Providers:  IncredibleEmployment.be  This test is no t yet approved or cleared by the [place] FDA and  has been authorized for detection and/or diagnosis of SARS-CoV-2 by FDA under an Emergency Use Authorization (EUA). This EUA will remain  in effect (meaning this test can be used) for the duration of the COVID-19 declaration under Section 564(b)(1) of the Act, 21 U.S.C.section 360bbb-3(b)(1), unless the authorization is terminated  or revoked sooner.       Influenza A by PCR NEGATIVE NEGATIVE Final   Influenza B by PCR NEGATIVE NEGATIVE Final    Comment: (NOTE) The Xpert Xpress SARS-CoV-2/FLU/RSV plus assay is intended as an aid in the diagnosis of influenza from Nasopharyngeal swab specimens and should not be used as a sole basis for treatment. Nasal washings and aspirates are unacceptable for Xpert Xpress SARS-CoV-2/FLU/RSV testing.  Fact Sheet for Patients: EntrepreneurPulse.com.au  Fact Sheet for Healthcare Providers: IncredibleEmployment.be  This test is not yet approved or cleared by the [place] FDA and has been authorized for detection and/or diagnosis of SARS-CoV-2 by FDA under an Emergency Use Authorization (EUA). This EUA will remain in effect (meaning this test can be used) for the duration of the COVID-19 declaration under Section 564(b)(1) of the Act, 21 U.S.C. section 360bbb-3(b)(1), unless the authorization is terminated  or revoked.  Performed at [facility], [place] [address], [place], [place] [address]   Resp Panel by RT-PCR (Flu A&B, Covid) Nasopharyngeal Swab     Status: None   Collection Time: 10/21/20 11:23 PM   Specimen: Nasopharyngeal Swab; Nasopharyngeal(NP) swabs in vial transport medium  Result Value Ref Range Status   SARS Coronavirus 2 by RT PCR NEGATIVE NEGATIVE Final    Comment: (NOTE) SARS-CoV-2 target nucleic acids are NOT DETECTED.  The SARS-CoV-2 RNA is generally detectable in upper respiratory specimens during the acute phase of infection. The lowest concentration of SARS-CoV-2 viral copies this assay can detect is 138 copies/mL. A negative result does not preclude SARS-Cov-2 infection and should not be used as the sole basis for treatment or other patient management decisions. A negative result may occur with  improper specimen collection/handling, submission of specimen other than nasopharyngeal swab, presence of viral mutation(s) within the areas targeted by this assay, and inadequate number of viral  copies(<138 copies/mL). A negative result must be combined with clinical observations, patient history, and epidemiological information. The expected result is Negative.  Fact Sheet for Patients:  EntrepreneurPulse.com.au  Fact Sheet for Healthcare Providers:  IncredibleEmployment.be  This test is no t yet approved or cleared by the [place] FDA and  has been authorized for detection and/or diagnosis of SARS-CoV-2 by FDA under an Emergency Use Authorization (EUA). This EUA will remain  in effect (meaning this test can be used) for the duration of the COVID-19 declaration under Section 564(b)(1) of the Act, 21 U.S.C.section 360bbb-3(b)(1), unless the authorization is terminated  or revoked sooner.       Influenza A by PCR NEGATIVE NEGATIVE Final   Influenza B by PCR NEGATIVE NEGATIVE Final    Comment: (NOTE) The  Xpert Xpress SARS-CoV-2/FLU/RSV plus assay is intended as an aid in the diagnosis of influenza from Nasopharyngeal swab specimens and should not be used as a sole basis for treatment. Nasal washings and aspirates are unacceptable for Xpert Xpress SARS-CoV-2/FLU/RSV testing.  Fact Sheet for Patients: EntrepreneurPulse.com.au  Fact Sheet for Healthcare Providers: IncredibleEmployment.be  This test is not yet approved or cleared by the [place] FDA and has been authorized for detection and/or diagnosis of SARS-CoV-2 by FDA under an Emergency Use Authorization (EUA). This EUA will remain in effect (meaning this test can be used) for the duration of the COVID-19 declaration under Section 564(b)(1) of the Act, 21 U.S.C. section 360bbb-3(b)(1), unless the authorization is terminated or revoked.  Performed at [facility], [place] [address], [place],  [address]     Radiology Reports DG Lumbar Spine Complete  Result Date: 10/13/2020 CLINICAL DATA:  Low back pain after fall last night. EXAM: LUMBAR SPINE - COMPLETE 4+ VIEW COMPARISON:  None. FINDINGS: Diffuse osteopenia is noted. No fracture or spondylolisthesis is noted. Mild degenerative disc disease is noted at L1-2 and L2-3. Moderate degenerative disc disease is noted at L5-S1. IMPRESSION: Multilevel degenerative disc disease. No acute abnormality seen in the lumbar spine. Electronically Signed   By: Marijo Conception M.D.   On: 10/13/2020 12:03   CT Head Wo Contrast  Result Date: 10/21/2020 CLINICAL DATA:  Worsening altered mental status. EXAM: CT HEAD WITHOUT CONTRAST TECHNIQUE: Contiguous axial images were obtained from the base of the skull through the vertex without intravenous contrast. COMPARISON:  CT head 08/29/2020. FINDINGS: Brain: Patchy and confluent areas of decreased attenuation are noted throughout the deep and periventricular white matter of the cerebral  hemispheres bilaterally, compatible with chronic microvascular ischemic disease. No evidence of large-territorial acute infarction. No parenchymal hemorrhage. No mass lesion. No extra-axial collection. No mass effect or midline shift. No hydrocephalus. Basilar cisterns are patent. Vascular: No hyperdense vessel. Atherosclerotic calcifications are present within the cavernous internal carotid and vertebral arteries. Skull: No acute fracture or focal lesion. Sinuses/Orbits: Paranasal sinuses and mastoid air cells are clear. The orbits are unremarkable. Other: None. IMPRESSION: No acute intracranial abnormality. Electronically Signed   By: Iven Finn M.D.   On: 10/21/2020 22:47   CT PELVIS WO CONTRAST  Result Date: 10/13/2020 CLINICAL DATA:  Patient status post fall last night which she was knocked down by a [place]. Initial encounter. EXAM: CT PELVIS WITHOUT CONTRAST TECHNIQUE: Multidetector CT imaging of the pelvis was performed following the standard protocol without intravenous contrast. COMPARISON:  Plain films of the lumbar spine and left hip this same day. FINDINGS: Bones/Joint/Cartilage The patient has an acute, nondisplaced fracture of the left  sacrum. No other fracture is identified. The hips are located. Bones are osteopenic. Hip joint spaces are preserved. Mild degenerative change at the symphysis pubis and about the SI joints noted. No lytic or sclerotic lesion. Ligaments Suboptimally assessed by CT. Muscles and Tendons Negative.  No acute or focal abnormality. Soft tissues Imaged intrapelvic contents demonstrate aortic atherosclerosis. The patient is status post hysterectomy. IMPRESSION: Acute, nondisplaced left sacral ala fracture. No other acute abnormality is identified. Osteopenia. Aortic Atherosclerosis (ICD10-I70.0). Electronically Signed   By: Inge Rise M.D.   On: 10/13/2020 13:04   MR BRAIN WO CONTRAST  Result Date: 10/22/2020 CLINICAL DATA:  Mental status change,  unknown cause EXAM: MRI HEAD WITHOUT CONTRAST TECHNIQUE: Multiplanar, multiecho pulse sequences of the brain and surrounding structures were obtained without intravenous contrast. COMPARISON:  10/21/2020 and prior FINDINGS: Please note that examination was terminated early secondary to patient disposition. Limited sequence acquisition and motion artifact limit evaluation. Brain: No diffusion-weighted signal abnormality. No intracranial hemorrhage. No midline shift, ventriculomegaly or extra-axial fluid collection. No mass lesion. Remote left cerebellar lacunar insult. Vascular: Normal flow voids. Skull and upper cervical spine: No acute finding. Sinuses/Orbits: Normal orbits. Clear paranasal sinuses. Small right mastoid effusion. Other: None. IMPRESSION: No acute intracranial process. Remote left cerebellar lacunar insult. Limited sequence acquisition and motion artifact limit evaluation. Electronically Signed   By: Primitivo Gauze M.D.   On: 10/22/2020 11:11   DG Shoulder Left  Result Date: 10/13/2020 CLINICAL DATA:  69 year old female with fall EXAM: LEFT SHOULDER - 2+ VIEW COMPARISON:  None. FINDINGS: No acute displaced fracture. Glenohumeral joint appears congruent. Degenerative changes at the [facility] joint. IMPRESSION: Negative for acute bony abnormality Electronically Signed   By: Corrie Mckusick D.O.   On: 10/13/2020 12:01   DG Hip Unilat W or Wo Pelvis 2-3 Views Left  Result Date: 10/13/2020 CLINICAL DATA:  Left hip pain after fall last night. EXAM: DG HIP (WITH OR WITHOUT PELVIS) 2-3V LEFT COMPARISON:  None. FINDINGS: There is no evidence of hip fracture or dislocation. There is no evidence of arthropathy or other focal bone abnormality. IMPRESSION: Negative. Electronically Signed   By: Marijo Conception M.D.   On: 10/13/2020 12:02     Time Spent in minutes  30     Desiree Hane M.D on 10/23/2020 at 9:37 AM  To page go to [email or web address] - password [facility]

## 2020-10-24 DIAGNOSIS — K219 Gastro-esophageal reflux disease without esophagitis: Secondary | ICD-10-CM

## 2020-10-24 DIAGNOSIS — J0110 Acute frontal sinusitis, unspecified: Secondary | ICD-10-CM

## 2020-10-24 DIAGNOSIS — E871 Hypo-osmolality and hyponatremia: Secondary | ICD-10-CM

## 2020-10-24 DIAGNOSIS — D72829 Elevated white blood cell count, unspecified: Secondary | ICD-10-CM

## 2020-10-24 LAB — COMPREHENSIVE METABOLIC PANEL
ALT: 11 U/L (ref 0–44)
AST: 16 U/L (ref 15–41)
Albumin: 2.7 g/dL — ABNORMAL LOW (ref 3.5–5.0)
Alkaline Phosphatase: 104 U/L (ref 38–126)
Anion gap: 10 (ref 5–15)
BUN: 11 mg/dL (ref 8–23)
CO2: 30 mmol/L (ref 22–32)
Calcium: 8.6 mg/dL — ABNORMAL LOW (ref 8.9–10.3)
Chloride: 97 mmol/L — ABNORMAL LOW (ref 98–111)
Creatinine, Ser: 0.93 mg/dL (ref 0.44–1.00)
GFR, Estimated: >60 mL/min (ref >60)
Glucose, Bld: 87 mg/dL (ref 70–99)
Potassium: 4.1 mmol/L (ref 3.5–5.1)
Sodium: 137 mmol/L (ref 135–145)
Total Bilirubin: 0.2 mg/dL — ABNORMAL LOW (ref 0.3–1.2)
Total Protein: 5.1 g/dL — ABNORMAL LOW (ref 6.5–8.1)

## 2020-10-24 LAB — EXPECTORATED SPUTUM ASSESSMENT W GRAM STAIN, RFLX TO RESP C

## 2020-10-24 LAB — CBC
HCT: 25.5 % — ABNORMAL LOW (ref 36.0–46.0)
Hemoglobin: 8 g/dL — ABNORMAL LOW (ref 12.0–15.0)
MCH: 25.6 pg — ABNORMAL LOW (ref 26.0–34.0)
MCHC: 31.4 g/dL (ref 30.0–36.0)
MCV: 81.7 fL (ref 80.0–100.0)
Platelets: 649 10*3/uL — ABNORMAL HIGH (ref 150–400)
RBC: 3.12 MIL/uL — ABNORMAL LOW (ref 3.87–5.11)
RDW: 16.1 % — ABNORMAL HIGH (ref 11.5–15.5)
WBC: 10.7 10*3/uL — ABNORMAL HIGH (ref 4.0–10.5)
nRBC: 0 % (ref 0.0–0.2)

## 2020-10-24 MED ORDER — BACLOFEN 10 MG PO TABS: 5.0000 mg | ORAL_TABLET | Freq: Three times a day (TID) | ORAL | 0 refills | Status: AC | PRN

## 2020-10-24 MED ORDER — CEFDINIR 300 MG PO CAPS: 300.0000 mg | ORAL_CAPSULE | Freq: Two times a day (BID) | ORAL | 0 refills | Status: AC

## 2020-10-24 MED ORDER — AZITHROMYCIN 250 MG PO TABS: 250.0000 mg | ORAL_TABLET | Freq: Every day | ORAL | 0 refills | Status: AC

## 2020-10-24 NOTE — Discharge Summary (Signed)
[name] WJX:[id number] DOB: [birth year] DOA: 10/21/2020  PCP: Nicholes Rough, PA-C  Admit date: 10/21/2020 Discharge date: 10/24/2020  Admitted From: Home Disposition:  home  Recommendations for Outpatient Follow-up:  1. Follow up with PCP in 1-2 weeks 2. Provided prescription for cefdinir for additional 3 days and azithromycin, reduce baclofen to 5 mg 3 times daily as needed, advised careful use of her opioid regiment and to follow the prescribed instructions (not a new medication, resume home medication for sacral fracture) 3. Please obtain BMP/CBC in one week 4. Please follow up on the following pending results:  Home Health: None Equipment/Devices: Rolling walker   Discharge Condition: Stable CODE STATUS: Full Diet Brief/Interim Summary: History of present illness:  [name] is a 69 year old female with medical history significant for PUD/GERD, HTN, chronic hyponatremia and recent hospitalization from 12/2-12/4 for nondisplaced uncomplicated left sacral alla fracture after a fall for which patient was started on oral opioids and local lidocaine patches as well as home health physical therapy.  She presented on 12/10 due to concerns from her brother of increased confusion as well as decreased oral intake.  In the ED she was afebrile, hemodynamically stable WBC 12.4 platelets 594 Covid test negative ammonia 20 sodium 134 creatinine 1.01 alcohol less than 10 UA unremarkable UDS positive for opioids.  Patient underwent CT head which showed no acute abnormalities.  Patient was brought in as observation for work-up of altered mental status.   Hospital Course:   Productive cough with some noted dyspnea, initially concerning for likely community-acquired pneumonia.  But felt to have chronic cough in the setting of tobacco use and no pneumonia/opacities on chest x-ray.  Further encouraged by patient remaining stable and oxygen saturation on room air and with exertion.    Afebrile,  white count initially resolved but then went up to 12.7 before downtrending to 10.7 with initiation of azithromycin IV ceftriaxone.  Chest x-ray did not show any pneumonia and patient maintained normal oxygen saturation on room air at rest and with exertion.  -Do not suspect community-acquired pneumonia -Productive cough likely related to smoking history -Additionally patient does have some sinus tenderness that may be more consistent with sinusitis -On discharge will continue azithromycin, cefdinir for additional 3 days -Continue to encourage smoking cessation -Supportive care with incentive spirometer -Obtain chest x-ray -start ceftriaxone and azithromycin -Sputum culture  Acute metabolic encephalopathy, resolved. Likely related to polypharmacy.  Alert oriented x4.  Mental status seems back to baseline.  Patient admits that she may have taken more pain control than needed after working with physical therapy at home.   Other metabolic work-up has been unremarkable so far.  MRI brain showed no acute pathology -Continue reduced version of home opioid regimen, reduced baclofen to 5 mg twice daily spasms on discharge  Syncope and Reported falls, resolved Patient states she does remember being in a wheelchair before falling.  She has multiple aged bruises on lower legs.  After recent hospitalization home health therapy have been recommended.  No focal deficits in lower extremities on examination.  Orthostatic vitals within normal limits.  Evaluated by PT with no issues -DME provided rolling walker per PT recommendations  Sacrum and coccyx fracture, present on admission, stable -Continue gabapentin 100 mg twice daily, lidocaine patch, low-dose oxycodone/acetaminophen every 3 hours moderate pain -Reduced dose of home baclofen for as needed use  HTN,  at goal. 1 -home  metoprolol 25 mg twice daily, Lasix 20 mg daily  GERD, stable -continue PPI  Tobacco  use She reports quitting in the past  90 days   Consultations:  None  Procedures/Studies: None Subjective: Feels cough has thinned out, only white/yellow phlegm coming up.  No fevers overnight.  No chest pain.  No shortness of breath. Discharge Exam: Vitals:   10/24/20 0440 10/24/20 1211  BP: (!) 115/55 (!) 142/61  Pulse: 75 72  Resp: 20 16  Temp: 97.9 F (36.6 C) 98.9 F (37.2 C)  SpO2: 90% 96%   Vitals:   10/23/20 1233 10/23/20 2049 10/24/20 0440 10/24/20 1211  BP: (!) 148/91 (!) 152/83 (!) 115/55 (!) 142/61  Pulse: 82 81 75 72  Resp:   20 16  Temp: 98.5 F (36.9 C) 98.8 F (37.1 C) 97.9 F (36.6 C) 98.9 F (37.2 C)  TempSrc: Oral Oral Oral Oral  SpO2: 97% 96% 90% 96%  Weight:      Height:        General: Lying in bed, no apparent distress Eyes: EOMI, anicteric ENT: Oral Mucosa clear and moist, slight tenderness to palpation on frontal sinus Cardiovascular: regular rate and rhythm, no murmurs, rubs or gallops, no edema, Respiratory: Normal respiratory effort on room air, lungs clear to auscultation bilaterally Abdomen: soft, non-distended, non-tender, normal bowel sounds Skin: No Rash Neurologic: Grossly no focal neuro deficit.Mental status AAOx3, speech normal, Psychiatric:Appropriate affect, and mood  Discharge Diagnoses:  Principal Problem:   AMS (altered mental status) Active Problems:   Hypertension   GERD (gastroesophageal reflux disease)   Tobacco use   Hyperlipidemia   Hyponatremia   Malnutrition of severe degree   Sacrum and coccyx fracture (HCC)   Hypoalbuminemia   Acute metabolic encephalopathy   Altered mental status   Syncope    Discharge Instructions  Discharge Instructions    Diet - low sodium heart healthy   Complete by: As directed    Increase activity slowly   Complete by: As directed      Allergies as of 10/24/2020      Reactions   Iodine Shortness Of Breath, Rash   Thiazide-type Diuretics Other (See Comments)   Severe hyponatremia July 2021   Shellfish  Allergy    Causes rash      Medication List    TAKE these medications   alum & mag hydroxide-simeth 200-200-20 MG/5ML suspension Commonly known as: MAALOX/MYLANTA Take 30 mLs by mouth every 4 (four) hours as needed for indigestion or heartburn.   azithromycin 250 MG tablet Commonly known as: ZITHROMAX Take 1 tablet (250 mg total) by mouth daily for 3 days. Start taking on: October 25, 2020   baclofen 10 MG tablet Commonly known as: LIORESAL Take 0.5 tablets (5 mg total) by mouth 3 (three) times daily as needed for muscle spasms. What changed:   how much to take  when to take this   bisoprolol 5 MG tablet Commonly known as: ZEBETA Take 5 mg by mouth daily.   cefdinir 300 MG capsule Commonly known as: OMNICEF Take 1 capsule (300 mg total) by mouth 2 (two) times daily for 3 days. Start taking on: October 25, 2020   famotidine 20 MG tablet Commonly known as: PEPCID Take 1 tablet (20 mg total) by mouth daily. What changed:   when to take this  reasons to take this   furosemide 20 MG tablet Commonly known as: LASIX Take 1 tablet (20 mg total) by mouth daily.   gabapentin 100 MG capsule Commonly known as: NEURONTIN Take 1 capsule (100 mg total) by mouth 2 (two) times  daily. What changed:   when to take this  reasons to take this   HYDROcodone-acetaminophen 5-325 MG tablet Commonly known as: NORCO/VICODIN Take 1 tablet by mouth every 8 (eight) hours as needed for moderate pain.   lidocaine 5 % Commonly known as: LIDODERM Place 1 patch onto the skin daily for 15 days. Remove & Discard patch within 12 hours or as directed by MD   metoprolol tartrate 25 MG tablet Commonly known as: LOPRESSOR Take 1 tablet (25 mg total) by mouth 2 (two) times daily.   pantoprazole 40 MG tablet Commonly known as: PROTONIX Take 1 tablet (40 mg total) by mouth 2 (two) times daily.   potassium chloride 20 MEQ/15ML (10%) Soln Take 15 mLs (20 mEq total) by mouth daily.    sodium chloride 0.65 % nasal spray Commonly known as: OCEAN Place 2 sprays into the nose in the morning, at noon, and at bedtime.   traMADol 50 MG tablet Commonly known as: ULTRAM Take 1 tablet (50 mg total) by mouth every 8 (eight) hours as needed for severe pain. What changed: reasons to take this   traZODone 50 MG tablet Commonly known as: DESYREL Take 50 mg by mouth at bedtime.            Durable Medical Equipment  (From admission, onward)         Start     Ordered   10/24/20 1246  For home use only DME Walker rolling  Once       Question Answer Comment  Walker: With 5 Inch Wheels   Patient needs a walker to treat with the following condition Sacral fracture ([place])      10/24/20 1245          Allergies  Allergen Reactions  . Iodine Shortness Of Breath and Rash  . Thiazide-Type Diuretics Other (See Comments)    Severe hyponatremia July 2021  . Shellfish Allergy     Causes rash        The results of significant diagnostics from this hospitalization (including imaging, microbiology, ancillary and laboratory) are listed below for reference.     Microbiology: Recent Results (from the past 240 hour(s))  Resp Panel by RT-PCR (Flu A&B, Covid) Nasopharyngeal Swab     Status: None   Collection Time: 10/21/20 11:23 PM   Specimen: Nasopharyngeal Swab; Nasopharyngeal(NP) swabs in vial transport medium  Result Value Ref Range Status   SARS Coronavirus 2 by RT PCR NEGATIVE NEGATIVE Final    Comment: (NOTE) SARS-CoV-2 target nucleic acids are NOT DETECTED.  The SARS-CoV-2 RNA is generally detectable in upper respiratory specimens during the acute phase of infection. The lowest concentration of SARS-CoV-2 viral copies this assay can detect is 138 copies/mL. A negative result does not preclude SARS-Cov-2 infection and should not be used as the sole basis for treatment or other patient management decisions. A negative result may occur with  improper specimen  collection/handling, submission of specimen other than nasopharyngeal swab, presence of viral mutation(s) within the areas targeted by this assay, and inadequate number of viral copies(<138 copies/mL). A negative result must be combined with clinical observations, patient history, and epidemiological information. The expected result is Negative.  Fact Sheet for Patients:  EntrepreneurPulse.com.au  Fact Sheet for Healthcare Providers:  IncredibleEmployment.be  This test is no t yet approved or cleared by the [place] FDA and  has been authorized for detection and/or diagnosis of SARS-CoV-2 by FDA under an Emergency Use Authorization (EUA). This EUA will remain  in effect (meaning this test can be used) for the duration of the COVID-19 declaration under Section 564(b)(1) of the Act, 21 U.S.C.section 360bbb-3(b)(1), unless the authorization is terminated  or revoked sooner.       Influenza A by PCR NEGATIVE NEGATIVE Final   Influenza B by PCR NEGATIVE NEGATIVE Final    Comment: (NOTE) The Xpert Xpress SARS-CoV-2/FLU/RSV plus assay is intended as an aid in the diagnosis of influenza from Nasopharyngeal swab specimens and should not be used as a sole basis for treatment. Nasal washings and aspirates are unacceptable for Xpert Xpress SARS-CoV-2/FLU/RSV testing.  Fact Sheet for Patients: EntrepreneurPulse.com.au  Fact Sheet for Healthcare Providers: IncredibleEmployment.be  This test is not yet approved or cleared by the [place] FDA and has been authorized for detection and/or diagnosis of SARS-CoV-2 by FDA under an Emergency Use Authorization (EUA). This EUA will remain in effect (meaning this test can be used) for the duration of the COVID-19 declaration under Section 564(b)(1) of the Act, 21 U.S.C. section 360bbb-3(b)(1), unless the authorization is terminated or revoked.  Performed at [facility], [place] [address], [place], [place] [address]   Expectorated sputum assessment w rflx to resp cult     Status: None   Collection Time: 10/24/20  5:45 AM   Specimen: Expectorated Sputum  Result Value Ref Range Status   Specimen Description EXPECTORATED SPUTUM  Final   Special Requests NONE  Final   Sputum evaluation   Final    THIS SPECIMEN IS ACCEPTABLE FOR SPUTUM CULTURE Performed at [facility], [place] [address], [place], [place] [address]    Report Status 10/24/2020 FINAL  Final     Labs: BNP (last 3 results) Recent Labs    02/10/20 0756 09/07/20 1701 09/11/20 0812  BNP 547.1* 3,015.3* 127.5*   Basic Metabolic Panel: Recent Labs  Lab 10/21/20 2022 10/22/20 0450 10/23/20 0453 10/24/20 0432  NA 134* 138 136 137  K 4.4 3.9 3.5 4.1  CL 97* 102 96* 97*  CO2 25 25 29 30   GLUCOSE 92 82 97 87  BUN 15 12 11 11   CREATININE 1.01* 0.83 0.84 0.93  CALCIUM 9.0 8.7* 8.7* 8.6*   Liver Function Tests: Recent Labs  Lab 10/21/20 2022 10/23/20 0453 10/24/20 0432  AST 22 20 16   ALT 14 12 11   ALKPHOS 114 106 104  BILITOT 0.6 0.3 0.2*  PROT 6.0* 5.6* 5.1*  ALBUMIN 3.3* 2.9* 2.7*   No results for input(s): LIPASE, AMYLASE in the last 168 hours. Recent Labs  Lab 10/21/20 2125  AMMONIA 20   CBC: Recent Labs  Lab 10/21/20 2022 10/22/20 0450 10/23/20 0453 10/24/20 0432  WBC 12.4* 10.2 12.7* 10.7*  NEUTROABS 9.5*  --   --   --   HGB 8.3* 8.2* 8.6* 8.0*  HCT 26.2* 25.5* 27.0* 25.5*  MCV 81.4 79.9* 79.6* 81.7  PLT 594* 392 642* 649*   Cardiac Enzymes: No results for input(s): CKTOTAL, CKMB, CKMBINDEX, TROPONINI in the last 168 hours. BNP: Invalid input(s): POCBNP CBG: No results for input(s): GLUCAP in the last 168 hours. D-Dimer No results for input(s): DDIMER in the last 72 hours. Hgb A1c No results for input(s): HGBA1C in the last 72 hours. Lipid Profile No results for input(s): CHOL, HDL, LDLCALC, TRIG, CHOLHDL,  LDLDIRECT in the last 72 hours. Thyroid function studies No results for input(s): TSH, T4TOTAL, T3FREE, THYROIDAB in the last 72 hours.  Invalid input(s): FREET3 Anemia work up [employer]  10/23/20 0453  VITAMINB12 281  FOLATE 11.3  FERRITIN 18  TIBC 381  IRON 12*   Urinalysis    Component Value Date/Time   COLORURINE STRAW (A) 10/21/2020 1949   APPEARANCEUR HAZY (A) 10/21/2020 1949   LABSPEC 1.006 10/21/2020 1949   PHURINE 7.0 10/21/2020 1949   GLUCOSEU NEGATIVE 10/21/2020 1949   HGBUR NEGATIVE 10/21/2020 1949   BILIRUBINUR NEGATIVE 10/21/2020 1949   KETONESUR 5 (A) 10/21/2020 1949   PROTEINUR NEGATIVE 10/21/2020 1949   UROBILINOGEN 0.2 05/25/2011 1337   NITRITE NEGATIVE 10/21/2020 1949   LEUKOCYTESUR NEGATIVE 10/21/2020 1949   Sepsis Labs Invalid input(s): PROCALCITONIN,  WBC,  LACTICIDVEN Microbiology Recent Results (from the past 240 hour(s))  Resp Panel by RT-PCR (Flu A&B, Covid) Nasopharyngeal Swab     Status: None   Collection Time: 10/21/20 11:23 PM   Specimen: Nasopharyngeal Swab; Nasopharyngeal(NP) swabs in vial transport medium  Result Value Ref Range Status   SARS Coronavirus 2 by RT PCR NEGATIVE NEGATIVE Final    Comment: (NOTE) SARS-CoV-2 target nucleic acids are NOT DETECTED.  The SARS-CoV-2 RNA is generally detectable in upper respiratory specimens during the acute phase of infection. The lowest concentration of SARS-CoV-2 viral copies this assay can detect is 138 copies/mL. A negative result does not preclude SARS-Cov-2 infection and should not be used as the sole basis for treatment or other patient management decisions. A negative result may occur with  improper specimen collection/handling, submission of specimen other than nasopharyngeal swab, presence of viral mutation(s) within the areas targeted by this assay, and inadequate number of viral copies(<138 copies/mL). A negative result must be combined with clinical observations, patient  history, and epidemiological information. The expected result is Negative.  Fact Sheet for Patients:  EntrepreneurPulse.com.au  Fact Sheet for Healthcare Providers:  IncredibleEmployment.be  This test is no t yet approved or cleared by the [place] FDA and  has been authorized for detection and/or diagnosis of SARS-CoV-2 by FDA under an Emergency Use Authorization (EUA). This EUA will remain  in effect (meaning this test can be used) for the duration of the COVID-19 declaration under Section 564(b)(1) of the Act, 21 U.S.C.section 360bbb-3(b)(1), unless the authorization is terminated  or revoked sooner.       Influenza A by PCR NEGATIVE NEGATIVE Final   Influenza B by PCR NEGATIVE NEGATIVE Final    Comment: (NOTE) The Xpert Xpress SARS-CoV-2/FLU/RSV plus assay is intended as an aid in the diagnosis of influenza from Nasopharyngeal swab specimens and should not be used as a sole basis for treatment. Nasal washings and aspirates are unacceptable for Xpert Xpress SARS-CoV-2/FLU/RSV testing.  Fact Sheet for Patients: EntrepreneurPulse.com.au  Fact Sheet for Healthcare Providers: IncredibleEmployment.be  This test is not yet approved or cleared by the [place] FDA and has been authorized for detection and/or diagnosis of SARS-CoV-2 by FDA under an Emergency Use Authorization (EUA). This EUA will remain in effect (meaning this test can be used) for the duration of the COVID-19 declaration under Section 564(b)(1) of the Act, 21 U.S.C. section 360bbb-3(b)(1), unless the authorization is terminated or revoked.  Performed at [facility], [place] [address], [place], [place] [address]   Expectorated sputum assessment w rflx to resp cult     Status: None   Collection Time: 10/24/20  5:45 AM   Specimen: Expectorated Sputum  Result Value Ref Range Status   Specimen Description  EXPECTORATED SPUTUM  Final   Special Requests NONE  Final   Sputum evaluation  Final    THIS SPECIMEN IS ACCEPTABLE FOR SPUTUM CULTURE Performed at [place] [address], [place], [place] [address]    Report Status 10/24/2020 FINAL  Final     Time coordinating discharge: Over 30 minutes  SIGNED:   Desiree Hane, MD  [facility] 10/24/2020, 12:53 PM Pager   If 7PM-7AM, please contact night-coverage [email or web address] Password [email or web address]

## 2020-10-24 NOTE — Progress Notes (Signed)
NUTRITION NOTE:   Consult for nutrition assessment entered on 10/22/20.   Current diet order: Regular with Thin liquids. Eating 50-100% of last two documented meal reports.   Skin WDL.   Admission Weight: 81 lbs. Pt has experienced a gradual decline in weight. Pt weight on 09/23/20 86.7 lbs, which is down 6 lbs from current admission weight. Of note, on 09/15/20 she weighed 93 lbs. This indicates a 13% weight loss within a month, which is significant.   Discharge order and discharge summary for discharge home entered earlier today. If pt unable to discharge for any reason, will see for full assessment 12/14.    Ronnald Nian, Dietetic Intern Pager: [phone] If unavailable: [phone]

## 2020-10-24 NOTE — Evaluation (Signed)
Physical Therapy Evaluation Patient Details Name: [name] MRN: [id number] DOB: [birth year] Today's Date: 10/24/2020   History of Present Illness  [name] is a 69 year old female with medical history significant for PUD/GERD, HTN, chronic hyponatremia and recent hospitalization from 12/2-12/4 for nondisplaced uncomplicated left sacral alla fracture after a fall . DC'd to home of brother.She presented on 12/10 due to concerns from her brother of increased confusion as well as decreased oral intake.  Clinical Impression  The  Patient is mobilizing with min guard. Ambulated x 60' using RW. Patient plans to return to stay with her brother. Has HHPT following.Pt admitted with above diagnosis. Pt currently with functional limitations due to the deficits listed below (see PT Problem List). Pt will benefit from skilled PT to increase their independence and safety with mobility to allow discharge to the venue listed below.       Follow Up Recommendations  HHPT    Equipment Recommendations    NONE   Recommendations for Other Services       Precautions / Restrictions Precautions Precautions: Fall      Mobility  Bed Mobility   Bed Mobility: Sit to Supine     Supine to sit: Supervision;HOB elevated Sit to supine: Supervision   General bed mobility comments: requires no assistance    Transfers Overall transfer level: Needs assistance Equipment used: Rolling walker (2 wheeled) Transfers: Sit to/from Stand Sit to Stand: Min guard            Ambulation/Gait Ambulation/Gait assistance: Min guard Gait Distance (Feet): 20 Feet (then 60) Assistive device: Rolling walker (2 wheeled) Gait Pattern/deviations: Step-through pattern;Decreased stance time - left;Decreased stride length;Trunk flexed Gait velocity: decr   General Gait Details: pt. has slight antalgic gait.  Stairs            Wheelchair Mobility    Modified Rankin (Stroke Patients Only)       Balance      Sitting balance-Leahy Scale: Good       Standing balance-Leahy Scale: Fair Standing balance comment: using RW gait is steady                             Pertinent Vitals/Pain Pain Score: 8  Pain Location: L pelvis Pain Descriptors / Indicators: Grimacing;Guarding;Moaning;Discomfort Pain Intervention(s): Patient requesting pain meds-RN notified;Monitored during session;Limited activity within patient's tolerance    Home Living Family/patient expects to be discharged to:: Private residence Living Arrangements: Other relatives Available Help at Discharge: Family   Home Access: Level entry     Home Layout: One level   Additional Comments: patient staying with her brother    Prior Function Level of Independence: Needs assistance   Gait / Transfers Assistance Needed: using rollator in home  ADL's / Homemaking Assistance Needed: has been sponge bathing. just got a tub seat.        Hand Dominance   Dominant Hand: Right    Extremity/Trunk Assessment   Upper Extremity Assessment Upper Extremity Assessment: Overall WFL for tasks assessed    Lower Extremity Assessment Lower Extremity Assessment: Generalized weakness    Cervical / Trunk Assessment Cervical / Trunk Assessment: Kyphotic  Communication   Communication: No difficulties  Cognition Arousal/Alertness: Awake/alert Behavior During Therapy: Impulsive Overall Cognitive Status: Within Functional Limits for tasks assessed  General Comments      Exercises     Assessment/Plan    PT Assessment Patient needs continued PT services  PT Problem List Decreased activity tolerance;Decreased balance;Decreased knowledge of use of DME;Decreased safety awareness;Pain       PT Treatment Interventions DME instruction;Gait training;Functional mobility training;Therapeutic activities;Therapeutic exercise;Balance training;Neuromuscular  re-education;Patient/family education    PT Goals (Current goals can be found in the Care Plan section)  Acute Rehab PT Goals Patient Stated Goal: stay with my brother PT Goal Formulation: With patient Time For Goal Achievement: 11/07/20 Potential to Achieve Goals: Good    Frequency Min 3X/week   Barriers to discharge        Co-evaluation               AM-PAC PT "6 Clicks" Mobility  Outcome Measure Help needed turning from your back to your side while in a flat bed without using bedrails?: None Help needed moving from lying on your back to sitting on the side of a flat bed without using bedrails?: None Help needed moving to and from a bed to a chair (including a wheelchair)?: A Little Help needed standing up from a chair using your arms (e.g., wheelchair or bedside chair)?: A Little Help needed to walk in hospital room?: A Little Help needed climbing 3-5 steps with a railing? : A Lot 6 Click Score: 19    End of Session   Activity Tolerance: Patient tolerated treatment well;Patient limited by pain Patient left: in bed;with call bell/phone within reach;with bed alarm set Nurse Communication: Mobility status;Weight bearing status PT Visit Diagnosis: Unsteadiness on feet (R26.81);Other abnormalities of gait and mobility (R26.89);Pain Pain - Right/Left: Left Pain - part of body: Hip    Time: 1125-1150 PT Time Calculation (min) (ACUTE ONLY): 25 min   Charges:   PT Evaluation $PT Eval Low Complexity: 1 Low PT Treatments $Gait Training: 8-22 mins        ,Tresa Endo PT Acute Rehabilitation Services Pager [phone] Office [phone]   Claretha Cooper 10/24/2020, 1:05 PM

## 2020-10-24 NOTE — Plan of Care (Signed)
  Problem: Education: Goal: Knowledge of General Education information will improve Description: Including pain rating scale, medication(s)/side effects and non-pharmacologic comfort measures Outcome: Progressing   Problem: Health Behavior/Discharge Planning: Goal: Ability to manage health-related needs will improve Outcome: Progressing   Problem: Activity: Goal: Risk for activity intolerance will decrease Outcome: Progressing   Problem: Pain Managment: Goal: General experience of comfort will improve Outcome: Progressing   Problem: Safety: Goal: Ability to remain free from injury will improve Outcome: Progressing   Problem: Activity: Goal: Ability to tolerate increased activity will improve Outcome: Progressing

## 2020-10-24 NOTE — Progress Notes (Signed)
Patient called her brother earlier this afternoon. Patient is waiting for brother to arrive with her clothes, he is her transportation home. Patient sitting up in bed. No complaints voiced at this time. Denies needs.

## 2020-10-26 LAB — CULTURE, RESPIRATORY W GRAM STAIN: Culture: NORMAL

## 2020-11-26 ENCOUNTER — Inpatient Hospital Stay (HOSPITAL_COMMUNITY)
Admission: EM | Admit: 2020-11-26 | Discharge: 2020-12-13 | DRG: 871 | Disposition: E | Payer: Medicare Other | Attending: Internal Medicine | Admitting: Internal Medicine

## 2020-11-26 ENCOUNTER — Emergency Department (HOSPITAL_COMMUNITY): Payer: Medicare Other

## 2020-11-26 DIAGNOSIS — J90 Pleural effusion, not elsewhere classified: Secondary | ICD-10-CM | POA: Diagnosis present

## 2020-11-26 DIAGNOSIS — G40901 Epilepsy, unspecified, not intractable, with status epilepticus: Secondary | ICD-10-CM | POA: Diagnosis present

## 2020-11-26 DIAGNOSIS — R64 Cachexia: Secondary | ICD-10-CM | POA: Diagnosis present

## 2020-11-26 DIAGNOSIS — M6282 Rhabdomyolysis: Secondary | ICD-10-CM | POA: Diagnosis present

## 2020-11-26 DIAGNOSIS — Z20822 Contact with and (suspected) exposure to covid-19: Secondary | ICD-10-CM | POA: Diagnosis present

## 2020-11-26 DIAGNOSIS — J690 Pneumonitis due to inhalation of food and vomit: Secondary | ICD-10-CM | POA: Diagnosis present

## 2020-11-26 DIAGNOSIS — F419 Anxiety disorder, unspecified: Secondary | ICD-10-CM | POA: Diagnosis present

## 2020-11-26 DIAGNOSIS — F17210 Nicotine dependence, cigarettes, uncomplicated: Secondary | ICD-10-CM | POA: Diagnosis present

## 2020-11-26 DIAGNOSIS — R627 Adult failure to thrive: Secondary | ICD-10-CM | POA: Diagnosis present

## 2020-11-26 DIAGNOSIS — E876 Hypokalemia: Secondary | ICD-10-CM | POA: Diagnosis not present

## 2020-11-26 DIAGNOSIS — A09 Infectious gastroenteritis and colitis, unspecified: Secondary | ICD-10-CM | POA: Diagnosis present

## 2020-11-26 DIAGNOSIS — W19XXXA Unspecified fall, initial encounter: Secondary | ICD-10-CM | POA: Diagnosis present

## 2020-11-26 DIAGNOSIS — M25472 Effusion, left ankle: Secondary | ICD-10-CM | POA: Diagnosis present

## 2020-11-26 DIAGNOSIS — N179 Acute kidney failure, unspecified: Secondary | ICD-10-CM | POA: Diagnosis present

## 2020-11-26 DIAGNOSIS — E569 Vitamin deficiency, unspecified: Secondary | ICD-10-CM | POA: Diagnosis present

## 2020-11-26 DIAGNOSIS — Z4659 Encounter for fitting and adjustment of other gastrointestinal appliance and device: Secondary | ICD-10-CM

## 2020-11-26 DIAGNOSIS — R062 Wheezing: Secondary | ICD-10-CM

## 2020-11-26 DIAGNOSIS — R4182 Altered mental status, unspecified: Secondary | ICD-10-CM | POA: Diagnosis present

## 2020-11-26 DIAGNOSIS — G9340 Encephalopathy, unspecified: Secondary | ICD-10-CM | POA: Diagnosis not present

## 2020-11-26 DIAGNOSIS — Z818 Family history of other mental and behavioral disorders: Secondary | ICD-10-CM

## 2020-11-26 DIAGNOSIS — G9341 Metabolic encephalopathy: Secondary | ICD-10-CM | POA: Diagnosis present

## 2020-11-26 DIAGNOSIS — Z66 Do not resuscitate: Secondary | ICD-10-CM | POA: Diagnosis not present

## 2020-11-26 DIAGNOSIS — E222 Syndrome of inappropriate secretion of antidiuretic hormone: Secondary | ICD-10-CM | POA: Diagnosis present

## 2020-11-26 DIAGNOSIS — R4020 Unspecified coma: Secondary | ICD-10-CM | POA: Diagnosis not present

## 2020-11-26 DIAGNOSIS — Y92009 Unspecified place in unspecified non-institutional (private) residence as the place of occurrence of the external cause: Secondary | ICD-10-CM

## 2020-11-26 DIAGNOSIS — R569 Unspecified convulsions: Secondary | ICD-10-CM | POA: Diagnosis not present

## 2020-11-26 DIAGNOSIS — R6521 Severe sepsis with septic shock: Secondary | ICD-10-CM | POA: Diagnosis present

## 2020-11-26 DIAGNOSIS — E43 Unspecified severe protein-calorie malnutrition: Secondary | ICD-10-CM | POA: Diagnosis present

## 2020-11-26 DIAGNOSIS — F458 Other somatoform disorders: Secondary | ICD-10-CM | POA: Diagnosis present

## 2020-11-26 DIAGNOSIS — Z515 Encounter for palliative care: Secondary | ICD-10-CM

## 2020-11-26 DIAGNOSIS — X58XXXA Exposure to other specified factors, initial encounter: Secondary | ICD-10-CM | POA: Diagnosis present

## 2020-11-26 DIAGNOSIS — Z681 Body mass index (BMI) 19 or less, adult: Secondary | ICD-10-CM | POA: Diagnosis not present

## 2020-11-26 DIAGNOSIS — I509 Heart failure, unspecified: Secondary | ICD-10-CM | POA: Diagnosis present

## 2020-11-26 DIAGNOSIS — I110 Hypertensive heart disease with heart failure: Secondary | ICD-10-CM | POA: Diagnosis present

## 2020-11-26 DIAGNOSIS — K222 Esophageal obstruction: Secondary | ICD-10-CM | POA: Diagnosis present

## 2020-11-26 DIAGNOSIS — Z8711 Personal history of peptic ulcer disease: Secondary | ICD-10-CM

## 2020-11-26 DIAGNOSIS — Z7189 Other specified counseling: Secondary | ICD-10-CM | POA: Diagnosis not present

## 2020-11-26 DIAGNOSIS — Z96611 Presence of right artificial shoulder joint: Secondary | ICD-10-CM | POA: Diagnosis present

## 2020-11-26 DIAGNOSIS — J9601 Acute respiratory failure with hypoxia: Secondary | ICD-10-CM | POA: Diagnosis present

## 2020-11-26 DIAGNOSIS — S51012A Laceration without foreign body of left elbow, initial encounter: Secondary | ICD-10-CM | POA: Diagnosis present

## 2020-11-26 DIAGNOSIS — K449 Diaphragmatic hernia without obstruction or gangrene: Secondary | ICD-10-CM | POA: Diagnosis present

## 2020-11-26 DIAGNOSIS — Z91013 Allergy to seafood: Secondary | ICD-10-CM

## 2020-11-26 DIAGNOSIS — A419 Sepsis, unspecified organism: Secondary | ICD-10-CM | POA: Diagnosis present

## 2020-11-26 DIAGNOSIS — R011 Cardiac murmur, unspecified: Secondary | ICD-10-CM | POA: Diagnosis present

## 2020-11-26 DIAGNOSIS — E860 Dehydration: Secondary | ICD-10-CM | POA: Diagnosis present

## 2020-11-26 DIAGNOSIS — Z79899 Other long term (current) drug therapy: Secondary | ICD-10-CM

## 2020-11-26 DIAGNOSIS — S81012A Laceration without foreign body, left knee, initial encounter: Secondary | ICD-10-CM | POA: Diagnosis present

## 2020-11-26 DIAGNOSIS — Z888 Allergy status to other drugs, medicaments and biological substances status: Secondary | ICD-10-CM

## 2020-11-26 DIAGNOSIS — T730XXA Starvation, initial encounter: Secondary | ICD-10-CM | POA: Diagnosis present

## 2020-11-26 DIAGNOSIS — R7303 Prediabetes: Secondary | ICD-10-CM | POA: Diagnosis present

## 2020-11-26 DIAGNOSIS — K219 Gastro-esophageal reflux disease without esophagitis: Secondary | ICD-10-CM | POA: Diagnosis present

## 2020-11-26 DIAGNOSIS — E785 Hyperlipidemia, unspecified: Secondary | ICD-10-CM | POA: Diagnosis present

## 2020-11-26 DIAGNOSIS — Z91041 Radiographic dye allergy status: Secondary | ICD-10-CM

## 2020-11-26 LAB — I-STAT VENOUS BLOOD GAS, ED
Acid-base deficit: 17 mmol/L — ABNORMAL HIGH (ref 0.0–2.0)
Bicarbonate: 8.7 mmol/L — ABNORMAL LOW (ref 20.0–28.0)
Calcium, Ion: 0.95 mmol/L — ABNORMAL LOW (ref 1.15–1.40)
HCT: 36 % (ref 36.0–46.0)
Hemoglobin: 12.2 g/dL (ref 12.0–15.0)
O2 Saturation: 76 %
Potassium: 4.7 mmol/L (ref 3.5–5.1)
Sodium: 127 mmol/L — ABNORMAL LOW (ref 135–145)
TCO2: 9 mmol/L — ABNORMAL LOW (ref 22–32)
pCO2, Ven: 20.8 mmHg — ABNORMAL LOW (ref 44.0–60.0)
pH, Ven: 7.229 — ABNORMAL LOW (ref 7.250–7.430)
pO2, Ven: 47 mmHg — ABNORMAL HIGH (ref 32.0–45.0)

## 2020-11-26 LAB — URINALYSIS, ROUTINE W REFLEX MICROSCOPIC
Bilirubin Urine: NEGATIVE
Glucose, UA: 50 mg/dL — AB
Ketones, ur: 5 mg/dL — AB
Leukocytes,Ua: NEGATIVE
Nitrite: NEGATIVE
Protein, ur: 100 mg/dL — AB
Specific Gravity, Urine: 1.01 (ref 1.005–1.030)
pH: 5 (ref 5.0–8.0)

## 2020-11-26 LAB — RAPID URINE DRUG SCREEN, HOSP PERFORMED
Amphetamines: NOT DETECTED
Barbiturates: NOT DETECTED
Benzodiazepines: NOT DETECTED
Cocaine: NOT DETECTED
Opiates: POSITIVE — AB
Tetrahydrocannabinol: NOT DETECTED

## 2020-11-26 LAB — COMPREHENSIVE METABOLIC PANEL
ALT: 154 U/L — ABNORMAL HIGH (ref 0–44)
AST: 461 U/L — ABNORMAL HIGH (ref 15–41)
Albumin: 3.7 g/dL (ref 3.5–5.0)
Alkaline Phosphatase: 156 U/L — ABNORMAL HIGH (ref 38–126)
Anion gap: 29 — ABNORMAL HIGH (ref 5–15)
BUN: 32 mg/dL — ABNORMAL HIGH (ref 8–23)
CO2: 10 mmol/L — ABNORMAL LOW (ref 22–32)
Calcium: 9 mg/dL (ref 8.9–10.3)
Chloride: 93 mmol/L — ABNORMAL LOW (ref 98–111)
Creatinine, Ser: 2.36 mg/dL — ABNORMAL HIGH (ref 0.44–1.00)
GFR, Estimated: 22 mL/min — ABNORMAL LOW (ref >60)
Glucose, Bld: 202 mg/dL — ABNORMAL HIGH (ref 70–99)
Potassium: 4.7 mmol/L (ref 3.5–5.1)
Sodium: 132 mmol/L — ABNORMAL LOW (ref 135–145)
Total Bilirubin: 0.6 mg/dL (ref 0.3–1.2)
Total Protein: 6.2 g/dL — ABNORMAL LOW (ref 6.5–8.1)

## 2020-11-26 LAB — LACTIC ACID, PLASMA
Lactic Acid, Venous: >11 mmol/L (ref 0.5–1.9)
Lactic Acid, Venous: 9.3 mmol/L (ref 0.5–1.9)

## 2020-11-26 LAB — CBC WITH DIFFERENTIAL/PLATELET
Abs Immature Granulocytes: 0.22 10*3/uL — ABNORMAL HIGH (ref 0.00–0.07)
Basophils Absolute: 0.1 10*3/uL (ref 0.0–0.1)
Basophils Relative: 0 %
Eosinophils Absolute: 0 10*3/uL (ref 0.0–0.5)
Eosinophils Relative: 0 %
HCT: 32.9 % — ABNORMAL LOW (ref 36.0–46.0)
Hemoglobin: 9.4 g/dL — ABNORMAL LOW (ref 12.0–15.0)
Immature Granulocytes: 1 %
Lymphocytes Relative: 3 %
Lymphs Abs: 0.9 10*3/uL (ref 0.7–4.0)
MCH: 22.5 pg — ABNORMAL LOW (ref 26.0–34.0)
MCHC: 28.6 g/dL — ABNORMAL LOW (ref 30.0–36.0)
MCV: 78.7 fL — ABNORMAL LOW (ref 80.0–100.0)
Monocytes Absolute: 2.1 10*3/uL — ABNORMAL HIGH (ref 0.1–1.0)
Monocytes Relative: 8 %
Neutro Abs: 24 10*3/uL — ABNORMAL HIGH (ref 1.7–7.7)
Neutrophils Relative %: 88 %
Platelets: 609 10*3/uL — ABNORMAL HIGH (ref 150–400)
RBC: 4.18 MIL/uL (ref 3.87–5.11)
RDW: 17 % — ABNORMAL HIGH (ref 11.5–15.5)
WBC: 27.3 10*3/uL — ABNORMAL HIGH (ref 4.0–10.5)
nRBC: 0.4 % — ABNORMAL HIGH (ref 0.0–0.2)

## 2020-11-26 LAB — RESP PANEL BY RT-PCR (FLU A&B, COVID) ARPGX2
Influenza A by PCR: NEGATIVE
Influenza B by PCR: NEGATIVE
SARS Coronavirus 2 by RT PCR: NEGATIVE

## 2020-11-26 LAB — TROPONIN I (HIGH SENSITIVITY)
Troponin I (High Sensitivity): 21 ng/L — ABNORMAL HIGH (ref <18)
Troponin I (High Sensitivity): 21 ng/L — ABNORMAL HIGH (ref <18)

## 2020-11-26 LAB — SALICYLATE LEVEL: Salicylate Lvl: <7 mg/dL — ABNORMAL LOW (ref 7.0–30.0)

## 2020-11-26 LAB — CBG MONITORING, ED: Glucose-Capillary: 159 mg/dL — ABNORMAL HIGH (ref 70–99)

## 2020-11-26 LAB — PROTIME-INR
INR: 1.2 (ref 0.8–1.2)
Prothrombin Time: 14.4 seconds (ref 11.4–15.2)

## 2020-11-26 LAB — AMMONIA: Ammonia: 67 umol/L — ABNORMAL HIGH (ref 9–35)

## 2020-11-26 LAB — APTT: aPTT: 29 seconds (ref 24–36)

## 2020-11-26 LAB — BRAIN NATRIURETIC PEPTIDE: B Natriuretic Peptide: 306.2 pg/mL — ABNORMAL HIGH (ref 0.0–100.0)

## 2020-11-26 LAB — ACETAMINOPHEN LEVEL: Acetaminophen (Tylenol), Serum: <10 ug/mL — ABNORMAL LOW (ref 10–30)

## 2020-11-26 LAB — TSH: TSH: 1.123 u[IU]/mL (ref 0.350–4.500)

## 2020-11-26 LAB — CK: Total CK: 1025 U/L — ABNORMAL HIGH (ref 38–234)

## 2020-11-26 MED ORDER — ONDANSETRON HCL 4 MG/2ML IJ SOLN: 4.0000 mg | Freq: Four times a day (QID) | INTRAMUSCULAR | Status: DC | PRN

## 2020-11-26 MED ORDER — SODIUM CHLORIDE 0.9 % IV SOLN
2.0000 g | Freq: Once | INTRAVENOUS | Status: AC
  Administered 2020-11-26: 2 g via INTRAVENOUS
  Filled 2020-11-26: qty 2

## 2020-11-26 MED ORDER — SODIUM CHLORIDE 0.9 % IV BOLUS (SEPSIS)
1000.0000 mL | Freq: Once | INTRAVENOUS | Status: AC
  Administered 2020-11-26: 1000 mL via INTRAVENOUS

## 2020-11-26 MED ORDER — SODIUM CHLORIDE 0.9 % IV BOLUS
1000.0000 mL | Freq: Once | INTRAVENOUS | Status: AC
  Administered 2020-11-26: 1000 mL via INTRAVENOUS

## 2020-11-26 MED ORDER — LACTATED RINGERS IV SOLN: INTRAVENOUS | Status: DC

## 2020-11-26 MED ORDER — HEPARIN SODIUM (PORCINE) 5000 UNIT/ML IJ SOLN
5000.0000 [IU] | Freq: Three times a day (TID) | INTRAMUSCULAR | Status: DC
  Administered 2020-11-26 – 2020-11-30 (×11): 5000 [IU] via SUBCUTANEOUS
  Filled 2020-11-26 (×11): qty 1

## 2020-11-26 MED ORDER — DOCUSATE SODIUM 100 MG PO CAPS: 100.0000 mg | ORAL_CAPSULE | Freq: Two times a day (BID) | ORAL | Status: DC | PRN

## 2020-11-26 MED ORDER — PANTOPRAZOLE SODIUM 40 MG IV SOLR
40.0000 mg | Freq: Every day | INTRAVENOUS | Status: DC
  Administered 2020-11-26 – 2020-11-29 (×4): 40 mg via INTRAVENOUS
  Filled 2020-11-26 (×4): qty 40

## 2020-11-26 MED ORDER — VANCOMYCIN HCL IN DEXTROSE 1-5 GM/200ML-% IV SOLN
1000.0000 mg | Freq: Once | INTRAVENOUS | Status: AC
  Administered 2020-11-26: 1000 mg via INTRAVENOUS
  Filled 2020-11-26: qty 200

## 2020-11-26 MED ORDER — METRONIDAZOLE IN NACL 5-0.79 MG/ML-% IV SOLN
500.0000 mg | Freq: Once | INTRAVENOUS | Status: AC
  Administered 2020-11-26: 500 mg via INTRAVENOUS
  Filled 2020-11-26: qty 100

## 2020-11-26 MED ORDER — METRONIDAZOLE IN NACL 5-0.79 MG/ML-% IV SOLN
500.0000 mg | Freq: Three times a day (TID) | INTRAVENOUS | Status: DC
  Administered 2020-11-26 – 2020-11-28 (×5): 500 mg via INTRAVENOUS
  Filled 2020-11-26 (×5): qty 100

## 2020-11-26 MED ORDER — VANCOMYCIN VARIABLE DOSE PER UNSTABLE RENAL FUNCTION (PHARMACIST DOSING): Status: DC

## 2020-11-26 MED ORDER — POLYETHYLENE GLYCOL 3350 17 G PO PACK: 17.0000 g | PACK | Freq: Every day | ORAL | Status: DC | PRN

## 2020-11-26 MED ORDER — SODIUM CHLORIDE 0.9 % IV SOLN
2.0000 g | INTRAVENOUS | Status: DC
  Administered 2020-11-27: 2 g via INTRAVENOUS
  Filled 2020-11-26: qty 2

## 2020-11-26 NOTE — H&P (Signed)
NAME:  [name], MRN:  [id number], DOB:  1951/08/23, LOS: 0 ADMISSION DATE:  12/06/20,   CHIEF COMPLAINT: Altered mental status  Brief History:  70 year old female who presented with altered mental status from home  History of Present Illness:  This is a 70 year old white female that presented to the emergency room via EMS from home.  Patient's brother had not been in contact with her for approximately 24 hours and called police to do a wellness check.  She was found minimally responsive on the floor.  No history is available about events that led up to finding her on the floor.  She is unable to provide any history.  She was discharged from the hospital on 24 October 2020 following admission for acute metabolic encephalopathy and community-acquired pneumonia.  Past Medical History:  Community-acquired pneumonia 53/66 Metabolic encephalopathy 44/03 Sacrum and coccyx fracture 12/21 Hypertension GERD/peptic ulcer disease Syncope Chronic hyponatremia   Significant Diagnostic Tests:  CT head: IMPRESSION: No acute intracranial pathology  CT scan cervical spine: IMPRESSION: 1. Small deformity of indeterminate age involving an anterior osteophyte along the inferior endplate of the C3 vertebral body. MRI correlation is recommended. 2. Marked severity degenerative changes at the levels of C4-C5 and C5-C6. 3. Approximately 1 mm to 2 mm retrolisthesis of the C3 on C4 vertebral body.  CT abdomen: IMPRESSION: 1. Large sliding-type hiatal hernia. 2. Hepatic steatosis. 3. Wall thickening and inflammatory changes are seen involving most of the colon, most consistent with infectious or inflammatory colitis. 4. There are noted several small bowel loops in the left lower quadrant which demonstrate wall thickening suggesting enteritis.  Aortic Atherosclerosis (ICD10-I70.0).   Micro Data:  Influenza: Negative COVID: Negative  Antimicrobials:   Vancomycin Cefepime   Objective   Blood pressure (!) 179/94, pulse 84, temperature 98.6 F (37 C), resp. rate 18, height 5\' 2"  (1.575 m), weight 40 kg, SpO2 97 %.        Intake/Output Summary (Last 24 hours) at 12/06/2020 2137 Last data filed at 12-06-20 1754 Gross per 24 hour  Intake 1100 ml  Output -  Net 1100 ml   Filed Weights   12/06/20 1700  Weight: 40 kg    Examination: General: Cachectic, chronically ill-appearing no acute distress HENT: Normocephalic mucous membranes are dry Lungs: Clear to auscultation bilaterally no wheezing rales rhonchi noted Cardiovascular: Regular rate 4/6 systolic ejection murmur 2/4 diastolic murmur Abdomen: Soft, nondistended, no rebound/rigidity/guarding though limited by neurologic status, positive bowel sounds Extremities: Distal pulses intact x4 no significant edema Skin: Most of her extremities are covered in contusions.  She has multiple skin tears on the anterior surfaces of both lower extremities as well as right forearm and along the left arm.  She has a contusion that involves most of the forehead.  No significant contusions abrasions or penetrating injuries of the abdomen, chest or back. Neuro: Eyes open to voice but otherwise unresponsive.  Not following commands.  Extremities are contracted. GU: Foley catheter intact    Assessment & Plan:  Septic shock Severe lactic acidosis Rhabdomyolysis Enteritis concern for C. difficile  Acute renal failure Metabolic encephalopathy Diarrhea  Plan: Patient was admitted to the intensive care unit for further work-up. Patient's been started on broad-spectrum antibiotics with vancomycin and cefepime. Send stool nail for C. difficile toxin Add Flagyl Continue to monitor I's/O's hourly.  Foley catheter in place.  Lactated Ringer at 100 mL/h Serial CK Patient initially presented hypothermic but has been rewarmed and is now normothermic. Closely  follow neurologic status.   Attempted  to call patient's brother.  Emergency room physician discussed with him CODE STATUS and the patient will be a full code at this time.  Best practice (evaluated daily)  Diet: N.p.o. Pain/Anxiety/Delirium protocol (if indicated): n/a VAP protocol (if indicated): N/A DVT prophylaxis: Heparin subcu GI prophylaxis: Protonix Glucose control: Monitor blood sugar Mobility: Bedrest Disposition: Admit to ICU  Goals of Care:   Code Status: Full  Labs   CBC: Recent Labs  Lab 11/12/2020 1636 11/23/2020 1640  WBC 27.3*  --   NEUTROABS 24.0*  --   HGB 9.4* 12.2  HCT 32.9* 36.0  MCV 78.7*  --   PLT 609*  --     Basic Metabolic Panel: Recent Labs  Lab 12/06/2020 1636 12/06/2020 1640  NA 132* 127*  K 4.7 4.7  CL 93*  --   CO2 10*  --   GLUCOSE 202*  --   BUN 32*  --   CREATININE 2.36*  --   CALCIUM 9.0  --    GFR: Estimated Creatinine Clearance: 14.6 mL/min (A) (by C-G formula based on SCr of 2.36 mg/dL (H)). Recent Labs  Lab 11/29/2020 1636 11/18/2020 1836  WBC 27.3*  --   LATICACIDVEN >11.0* 9.3*    Liver Function Tests: Recent Labs  Lab 11/21/2020 1636  AST 461*  ALT 154*  ALKPHOS 156*  BILITOT 0.6  PROT 6.2*  ALBUMIN 3.7   No results for input(s): LIPASE, AMYLASE in the last 168 hours. Recent Labs  Lab 11/29/2020 1636  AMMONIA 67*    ABG    Component Value Date/Time   PHART 7.463 (H) 09/08/2020 1850   PCO2ART 48.1 (H) 09/08/2020 1850   PO2ART 131 (H) 09/08/2020 1850   HCO3 8.7 (L) 11/29/2020 1640   TCO2 9 (L) 12/05/2020 1640   ACIDBASEDEF 17.0 (H) 11/29/2020 1640   O2SAT 76.0 12/09/2020 1640     Coagulation Profile: Recent Labs  Lab 12/08/2020 1636  INR 1.2    Cardiac Enzymes: Recent Labs  Lab 12/02/2020 1636  CKTOTAL 1,025*    HbA1C: Hgb A1c MFr Bld  Date/Time Value Ref Range Status  08/28/2020 11:32 PM 5.2 4.8 - 5.6 % Final    Comment:    (NOTE)         Prediabetes: 5.7 - 6.4         Diabetes: >6.4         Glycemic control for adults with  diabetes: <7.0   07/06/2014 11:22 AM 6.4 4.6 - 6.5 % Final    Comment:    Glycemic Control Guidelines for People with Diabetes:Non Diabetic:  <6%Goal of Therapy: <7%Additional Action Suggested:  >8%     CBG: Recent Labs  Lab 11/18/2020 1717  [place] 159*    Review of Systems:   Unable to be completed secondary to patient's neurologic status  Past Medical History:  She,  has a past medical history of Abdominal pain, epigastric, Acid reflux, AKI (acute kidney injury) ([place]) (09/15/2020), Anxiety, GERD (gastroesophageal reflux disease), Heart murmur, Hiatal hernia, Hypertension, Hyponatremia (02/10/2020), Schatzki's ring, and Ulcer.   Surgical History:   Past Surgical History:  Procedure Laterality Date  . ABDOMINAL HYSTERECTOMY     complete for fibroids  . KNEE ARTHROSCOPY     right knee  . shoulder repalcement     right  . TONSILLECTOMY AND ADENOIDECTOMY       Social History:   reports that she has been smoking cigarettes. She has been smoking  about 0.50 packs per day. She has never used smokeless tobacco. She reports that she does not drink alcohol and does not use drugs.   Family History:  Her family history includes Anxiety disorder in her mother; Colon cancer in her maternal grandmother; Prostate cancer in her father.   Allergies Allergies  Allergen Reactions  . Iodine Shortness Of Breath and Rash  . Thiazide-Type Diuretics Other (See Comments)    Severe hyponatremia July 2021  . Shellfish Allergy     Causes rash     Home Medications  Prior to Admission medications   Medication Sig Start Date End Date Taking? Authorizing Provider  alum & mag hydroxide-simeth (MAALOX/MYLANTA) 200-200-20 MG/5ML suspension Take 30 mLs by mouth every 4 (four) hours as needed for indigestion or heartburn. 09/14/20   Cherene Altes, MD  baclofen (LIORESAL) 10 MG tablet Take 0.5 tablets (5 mg total) by mouth 3 (three) times daily as needed for muscle spasms. 10/24/20   Desiree Hane,  MD  bisoprolol (ZEBETA) 5 MG tablet Take 5 mg by mouth daily.    [provider]  famotidine (PEPCID) 20 MG tablet Take 1 tablet (20 mg total) by mouth daily. Patient taking differently: Take 20 mg by mouth daily as needed for heartburn. 10/04/20   Medina-Vargas, Monina C, NP  furosemide (LASIX) 20 MG tablet Take 1 tablet (20 mg total) by mouth daily. Patient not taking: Reported on 10/22/2020 10/04/20   Medina-Vargas, Monina C, NP  gabapentin (NEURONTIN) 100 MG capsule Take 1 capsule (100 mg total) by mouth 2 (two) times daily. Patient taking differently: Take 100 mg by mouth 2 (two) times daily as needed (nerve pain). 10/04/20   Medina-Vargas, Monina C, NP  HYDROcodone-acetaminophen (NORCO/VICODIN) 5-325 MG tablet Take 1 tablet by mouth 2 (two) times daily as needed for pain. 11/24/20   [provider]  metoprolol tartrate (LOPRESSOR) 25 MG tablet Take 1 tablet (25 mg total) by mouth 2 (two) times daily. 10/04/20   Medina-Vargas, Monina C, NP  pantoprazole (PROTONIX) 40 MG tablet Take 1 tablet (40 mg total) by mouth 2 (two) times daily. Patient not taking: Reported on 10/22/2020 10/04/20   Medina-Vargas, Monina C, NP  potassium chloride 20 MEQ/15ML (10%) SOLN Take 15 mLs (20 mEq total) by mouth daily. 10/04/20   Medina-Vargas, Monina C, NP  sodium chloride (OCEAN) 0.65 % nasal spray Place 2 sprays into the nose in the morning, at noon, and at bedtime.    [provider]  traMADol (ULTRAM) 50 MG tablet Take 1 tablet (50 mg total) by mouth every 8 (eight) hours as needed for severe pain. Patient taking differently: Take 50 mg by mouth every 8 (eight) hours as needed for moderate pain or severe pain. 10/04/20   Medina-Vargas, Monina C, NP  traZODone (DESYREL) 50 MG tablet Take 50 mg by mouth at bedtime.  10/07/20   [provider]     Critical care time: 50 minutes

## 2020-11-26 NOTE — ED Notes (Signed)
Patient transported to CT with RN Luiz Iron

## 2020-11-26 NOTE — Sepsis Progress Note (Signed)
ELINK   I following this sepsis

## 2020-11-26 NOTE — Progress Notes (Signed)
Requested additional lactic acid, CCM is on board, aware of lactic acid levels

## 2020-11-26 NOTE — ED Provider Notes (Signed)
[facility] EMERGENCY DEPARTMENT Provider Note   CSN: [id number] Arrival date & time: 11/16/2020  1606     History Chief Complaint  Patient presents with  . Altered Mental Status    [name] is a 70 y.o. female.   Altered Mental Status Presenting symptoms: unresponsiveness   Severity:  Unable to specify Most recent episode:  Today Progression:  Unchanged History limited by the patient unable to provide any history and no family members able to be contacted to provide more history.  Per EMS report, the son did call EMS as he was concerned, however only have the number for the brother in her chart and I attempted to contact him at the number provided with no response.  It appears the patient has been unresponsive, last known time she was responsive appears to be yesterday.     Past Medical History:  Diagnosis Date  . Abdominal pain, epigastric   . Acid reflux    Pt was born with acute acid  . AKI (acute kidney injury) ([place]) 09/15/2020  . Anxiety   . GERD (gastroesophageal reflux disease)   . Heart murmur   . Hiatal hernia   . Hypertension   . Hyponatremia 02/10/2020  . Schatzki's ring   . Ulcer    8 years ago    Patient Active Problem List   Diagnosis Date Noted  . Acute metabolic encephalopathy [id number]  . Altered mental status 10/22/2020  . Syncope 10/22/2020  . AMS (altered mental status) 10/21/2020  . Hypoalbuminemia 10/21/2020  . Sacrum and coccyx fracture ([place]) 10/13/2020  . Neurocognitive deficits 09/20/2020  . AKI (acute kidney injury) ([place]) 09/15/2020  . CHF (congestive heart failure) ([place])   . Hiatal hernia   . Malnutrition of severe degree 08/29/2020  . Respiratory failure ([place])   . Community acquired pneumonia of right lower lobe of lung   . Somnolence   . Failure to thrive in adult   . Generalized weakness 05/19/2020  . Hypokalemia 05/19/2020  . Hyponatremia 02/03/2020  . Ambulatory dysfunction   . Prediabetes  03/23/2013  . Hyperlipidemia 03/23/2013  . Colon cancer screening 11/18/2012  . Hypertension 09/16/2012  . GERD (gastroesophageal reflux disease) 09/16/2012  . Tobacco use 09/16/2012    Past Surgical History:  Procedure Laterality Date  . ABDOMINAL HYSTERECTOMY     complete for fibroids  . KNEE ARTHROSCOPY     right knee  . shoulder repalcement     right  . TONSILLECTOMY AND ADENOIDECTOMY       OB History   No obstetric history on file.     Family History  Problem Relation Age of Onset  . Prostate cancer Father   . Anxiety disorder Mother   . Colon cancer Maternal Grandmother     Social History   Tobacco Use  . Smoking status: Current Every Day Smoker    Packs/day: 0.50    Types: Cigarettes  . Smokeless tobacco: Never Used  . Tobacco comment: almost a pack a day;   Vaping Use  . Vaping Use: Never used  Substance Use Topics  . Alcohol use: No    Alcohol/week: 0.0 standard drinks  . Drug use: No    Home Medications Prior to Admission medications   Medication Sig Start Date End Date Taking? Authorizing Provider  alum & mag hydroxide-simeth (MAALOX/MYLANTA) 200-200-20 MG/5ML suspension Take 30 mLs by mouth every 4 (four) hours as needed for indigestion or heartburn. 09/14/20   Cherene Altes, MD  baclofen (LIORESAL) 10 MG tablet Take 0.5 tablets (5 mg total) by mouth 3 (three) times daily as needed for muscle spasms. 10/24/20   Desiree Hane, MD  bisoprolol (ZEBETA) 5 MG tablet Take 5 mg by mouth daily.    [provider]  famotidine (PEPCID) 20 MG tablet Take 1 tablet (20 mg total) by mouth daily. Patient taking differently: Take 20 mg by mouth daily as needed for heartburn. 10/04/20   Medina-Vargas, Monina C, NP  furosemide (LASIX) 20 MG tablet Take 1 tablet (20 mg total) by mouth daily. Patient not taking: Reported on 10/22/2020 10/04/20   Medina-Vargas, Monina C, NP  gabapentin (NEURONTIN) 100 MG capsule Take 1 capsule (100 mg total) by mouth 2  (two) times daily. Patient taking differently: Take 100 mg by mouth 2 (two) times daily as needed (nerve pain). 10/04/20   Medina-Vargas, Monina C, NP  HYDROcodone-acetaminophen (NORCO/VICODIN) 5-325 MG tablet Take 1 tablet by mouth 2 (two) times daily as needed for pain. 11/24/20   [provider]  metoprolol tartrate (LOPRESSOR) 25 MG tablet Take 1 tablet (25 mg total) by mouth 2 (two) times daily. 10/04/20   Medina-Vargas, Monina C, NP  pantoprazole (PROTONIX) 40 MG tablet Take 1 tablet (40 mg total) by mouth 2 (two) times daily. Patient not taking: Reported on 10/22/2020 10/04/20   Medina-Vargas, Monina C, NP  potassium chloride 20 MEQ/15ML (10%) SOLN Take 15 mLs (20 mEq total) by mouth daily. 10/04/20   Medina-Vargas, Monina C, NP  sodium chloride (OCEAN) 0.65 % nasal spray Place 2 sprays into the nose in the morning, at noon, and at bedtime.    [provider]  traMADol (ULTRAM) 50 MG tablet Take 1 tablet (50 mg total) by mouth every 8 (eight) hours as needed for severe pain. Patient taking differently: Take 50 mg by mouth every 8 (eight) hours as needed for moderate pain or severe pain. 10/04/20   Medina-Vargas, Monina C, NP  traZODone (DESYREL) 50 MG tablet Take 50 mg by mouth at bedtime.  10/07/20   [provider]    Allergies    Iodine, Thiazide-type diuretics, and Shellfish allergy  Review of Systems   Review of Systems  Unable to perform ROS: Patient unresponsive    Physical Exam Updated Vital Signs BP (!) 144/79   Pulse (!) 114   Temp 100.1 F (37.8 C) (Axillary)   Resp (!) 22   Ht 5\' 2"  (1.575 m)   Wt 40 kg   SpO2 96%   BMI 16.13 kg/m   Physical Exam Vitals and nursing note reviewed.  Constitutional:      Appearance: She is cachectic. She is ill-appearing.     Interventions: Cervical collar in place.  HENT:     Head: Abrasion and contusion present.      Nose: No nasal deformity or laceration.     Mouth/Throat:     Mouth: Mucous  membranes are dry.  Eyes:     Pupils: Pupils are equal.     Right eye: Pupil is sluggish.     Left eye: Pupil is sluggish.     Comments: Pupils 22mm and sluggish  Cardiovascular:     Rate and Rhythm: Normal rate and regular rhythm.     Pulses:          Radial pulses are 2+ on the right side and 2+ on the left side.       Dorsalis pedis pulses are 2+ on the right side and 2+ on  the left side.  Pulmonary:     Effort: No tachypnea or bradypnea.     Breath sounds: Normal breath sounds. No decreased breath sounds.  Chest:     Chest wall: No deformity or crepitus.  Abdominal:     General: Abdomen is flat.     Palpations: Abdomen is soft.  Musculoskeletal:     Cervical back: No rigidity.     Comments: L ankle swelling, skin tears to left elbow, left knee, and left medial tib  Neurological:     Mental Status: She is unresponsive.     GCS: GCS eye subscore is 2. GCS verbal subscore is 2. GCS motor subscore is 2.     ED Results / Procedures / Treatments   Labs (all labs ordered are listed, but only abnormal results are displayed) Labs Reviewed  LACTIC ACID, PLASMA - Abnormal; Notable for the following components:      Result Value   Lactic Acid, Venous >11.0 (*)    All other components within normal limits  LACTIC ACID, PLASMA - Abnormal; Notable for the following components:   Lactic Acid, Venous 9.3 (*)    All other components within normal limits  COMPREHENSIVE METABOLIC PANEL - Abnormal; Notable for the following components:   Sodium 132 (*)    Chloride 93 (*)    CO2 10 (*)    Glucose, Bld 202 (*)    BUN 32 (*)    Creatinine, Ser 2.36 (*)    Total Protein 6.2 (*)    AST 461 (*)    ALT 154 (*)    Alkaline Phosphatase 156 (*)    GFR, Estimated 22 (*)    Anion gap 29 (*)    All other components within normal limits  CBC WITH DIFFERENTIAL/PLATELET - Abnormal; Notable for the following components:   WBC 27.3 (*)    Hemoglobin 9.4 (*)    HCT 32.9 (*)    MCV 78.7 (*)     MCH 22.5 (*)    MCHC 28.6 (*)    RDW 17.0 (*)    Platelets 609 (*)    nRBC 0.4 (*)    Neutro Abs 24.0 (*)    Monocytes Absolute 2.1 (*)    Abs Immature Granulocytes 0.22 (*)    All other components within normal limits  URINALYSIS, ROUTINE W REFLEX MICROSCOPIC - Abnormal; Notable for the following components:   APPearance HAZY (*)    Glucose, UA 50 (*)    Hgb urine dipstick LARGE (*)    Ketones, ur 5 (*)    Protein, ur 100 (*)    Bacteria, UA RARE (*)    All other components within normal limits  AMMONIA - Abnormal; Notable for the following components:   Ammonia 67 (*)    All other components within normal limits  CK - Abnormal; Notable for the following components:   Total CK 1,025 (*)    All other components within normal limits  ACETAMINOPHEN LEVEL - Abnormal; Notable for the following components:   Acetaminophen (Tylenol), Serum <10 (*)    All other components within normal limits  SALICYLATE LEVEL - Abnormal; Notable for the following components:   Salicylate Lvl <0.5 (*)    All other components within normal limits  RAPID URINE DRUG SCREEN, HOSP PERFORMED - Abnormal; Notable for the following components:   Opiates POSITIVE (*)    All other components within normal limits  BRAIN NATRIURETIC PEPTIDE - Abnormal; Notable for the following components:   B  Natriuretic Peptide 306.2 (*)    All other components within normal limits  CK TOTAL AND CKMB (NOT AT [facility]) - Abnormal; Notable for the following components:   Total CK 1,648 (*)    CK, MB 55.7 (*)    Relative Index 3.4 (*)    All other components within normal limits  I-STAT VENOUS BLOOD GAS, ED - Abnormal; Notable for the following components:   pH, Ven 7.229 (*)    pCO2, Ven 20.8 (*)    pO2, Ven 47.0 (*)    Bicarbonate 8.7 (*)    TCO2 9 (*)    Acid-base deficit 17.0 (*)    Sodium 127 (*)    Calcium, Ion 0.95 (*)    All other components within normal limits  CBG MONITORING, ED - Abnormal; Notable for the  following components:   Glucose-Capillary 159 (*)    All other components within normal limits  TROPONIN I (HIGH SENSITIVITY) - Abnormal; Notable for the following components:   Troponin I (High Sensitivity) 21 (*)    All other components within normal limits  TROPONIN I (HIGH SENSITIVITY) - Abnormal; Notable for the following components:   Troponin I (High Sensitivity) 21 (*)    All other components within normal limits  RESP PANEL BY RT-PCR (FLU A&B, COVID) [id number]  CULTURE, BLOOD (ROUTINE X 2)  CULTURE, BLOOD (ROUTINE X 2)  URINE CULTURE  C DIFFICILE QUICK SCREEN W PCR REFLEX  MRSA PCR SCREENING  PROTIME-INR  APTT  TSH  CBC  BASIC METABOLIC PANEL  MAGNESIUM  PHOSPHORUS  CK TOTAL AND CKMB (NOT AT [facility])  CK TOTAL AND CKMB (NOT AT [facility])  LACTIC ACID, PLASMA  CK TOTAL AND CKMB (NOT AT [facility])  CK TOTAL AND CKMB (NOT AT [facility])    EKG EKG Interpretation  Date/Time:  Saturday November 26 2020 17:38:39 EST Ventricular Rate:  82 PR Interval:    QRS Duration: 84 QT Interval:  404 QTC Calculation: 472 R Axis:   70 Text Interpretation: Sinus rhythm Probable anteroseptal infarct, old No significant change since last tracing Confirmed by Theotis Burrow [phone]) on 12/05/2020 6:17:06 PM   Radiology CT Head Wo Contrast  Result Date: 11/24/2020 CLINICAL DATA:  Patient found down on the bathroom floor. EXAM: CT HEAD WITHOUT CONTRAST TECHNIQUE: Contiguous axial images were obtained from the base of the skull through the vertex without intravenous contrast. COMPARISON:  October 21, 2020 FINDINGS: Brain: There is mild cerebral atrophy with widening of the extra-axial spaces and ventricular dilatation. There are areas of decreased attenuation within the white matter tracts of the supratentorial brain, consistent with microvascular disease changes. Vascular: No hyperdense vessel or unexpected calcification. Skull: Normal. Negative for fracture or focal lesion. Sinuses/Orbits: No acute finding.  Other: None. IMPRESSION: No acute intracranial pathology. Electronically Signed   By: Virgina Norfolk M.D.   On: 11/16/2020 17:15   CT Cervical Spine Wo Contrast  Result Date: 12/03/2020 CLINICAL DATA:  Patient found down on the bathroom floor. EXAM: CT CERVICAL SPINE WITHOUT CONTRAST TECHNIQUE: Multidetector CT imaging of the cervical spine was performed without intravenous contrast. Multiplanar CT image reconstructions were also generated. COMPARISON:  None. FINDINGS: Alignment: Approximately 1 mm to 2 mm retrolisthesis of the C3 on C4 vertebral body is seen. Skull base and vertebrae: A small deformity of indeterminate age is seen involving an anterior osteophyte along the inferior endplate of the C3 vertebral body (sagittal reformatted image 31, CT series number 8). Soft tissues and spinal canal: No prevertebral fluid  or swelling. No visible canal hematoma. Disc levels: Mild endplate sclerosis is seen at the level of C3-C4 and C5-C6, with moderate severity endplate sclerosis seen at the level of C4-C5. Marked severity intervertebral disc space narrowing is seen at the levels of C4-C5 and C5-C6, with mild intervertebral disc space narrowing seen throughout the remainder of the cervical spine. Mild to moderate severity bilateral multilevel facet joint hypertrophy is noted. Upper chest: Negative. Other: None. IMPRESSION: 1. Small deformity of indeterminate age involving an anterior osteophyte along the inferior endplate of the C3 vertebral body. MRI correlation is recommended. 2. Marked severity degenerative changes at the levels of C4-C5 and C5-C6. 3. Approximately 1 mm to 2 mm retrolisthesis of the C3 on C4 vertebral body. Electronically Signed   By: Virgina Norfolk M.D.   On: 11/14/2020 17:21   DG Pelvis Portable  Result Date: 11/13/2020 CLINICAL DATA:  Questionable sepsis. EXAM: PORTABLE PELVIS 1-2 VIEWS COMPARISON:  None. FINDINGS: Nondisplaced fracture across the left side of the pubic symphysis  with associated sclerosis. This is of unclear chronicity. No other evidence of a fracture. No bone lesion. Hip joints, SI joints and symphysis pubis are normally aligned. Soft tissues are unremarkable. IMPRESSION: 1. Nondisplaced fracture along the left aspect of the pubic symphysis of unclear chronicity, but possibly recent. 2. No other evidence of an acute or recent abnormality. No bone lesions. Electronically Signed   By: Lajean Manes M.D.   On: 12/07/2020 17:09   DG Chest Portable 1 View  Result Date: 11/20/2020 CLINICAL DATA:  Patient found down on the bathroom floor. EXAM: PORTABLE CHEST 1 VIEW COMPARISON:  October 23, 2020 FINDINGS: The study is limited secondary to patient rotation. Mildly increased opacification, with well-defined border, is seen along the medial aspect of the right lung. There is no evidence of a pleural effusion or pneumothorax. The heart size and mediastinal contours are within normal limits. There is a large gastric hernia. An intact right humeral prosthesis is seen. Degenerative changes seen throughout the thoracic spine. IMPRESSION: 1. Mildly increased opacification along the medial aspect of the right lung which may be, in part, secondary to the previously noted patient rotation. Correlation with chest CT is recommended to further exclude the presence of an underlying hilar or mediastinal abnormality. 2. Large gastric hernia. Electronically Signed   By: Virgina Norfolk M.D.   On: 11/17/2020 17:13   CT CHEST ABDOMEN PELVIS WO CONTRAST  Result Date: 11/15/2020 CLINICAL DATA:  Unwitnessed fall. EXAM: CT CHEST, ABDOMEN AND PELVIS WITHOUT CONTRAST TECHNIQUE: Multidetector CT imaging of the chest, abdomen and pelvis was performed following the standard protocol without IV contrast. COMPARISON:  October 13, 2020. FINDINGS: CT CHEST FINDINGS Cardiovascular: Atherosclerosis of thoracic aorta is noted without aneurysm formation. Normal cardiac size. No pericardial effusion.  Mediastinum/Nodes: Large sliding-type hiatal hernia is noted. Thyroid gland is unremarkable. No adenopathy is noted. Lungs/Pleura: No pneumothorax or pleural effusion is noted. Left lung is clear. Mild right posterior basilar subsegmental atelectasis is noted. Musculoskeletal: No chest wall mass or suspicious bone lesions identified. CT ABDOMEN PELVIS FINDINGS Hepatobiliary: No gallstones or biliary dilatation is noted. Hepatic steatosis is noted. Pancreas: Unremarkable. No pancreatic ductal dilatation or surrounding inflammatory changes. Spleen: Normal in size without focal abnormality. Adrenals/Urinary Tract: Adrenal glands and kidneys appear normal. No hydronephrosis or renal obstruction is noted. No renal or ureteral calculi are noted. Urinary bladder is decompressed secondary to Foley catheter. Stomach/Bowel: The appendix appears normal. Wall thickening and inflammatory changes are seen involving most  of the colon, most consistent with infectious or inflammatory colitis. There are noted several small bowel loops in the left lower quadrant which demonstrate wall thickening suggesting enteritis. Vascular/Lymphatic: Aortic atherosclerosis. No enlarged abdominal or pelvic lymph nodes. Reproductive: Status post hysterectomy. No adnexal masses. Other: No abdominal wall hernia or abnormality. No abdominopelvic ascites. Musculoskeletal: No acute or significant osseous findings. IMPRESSION: 1. Large sliding-type hiatal hernia. 2. Hepatic steatosis. 3. Wall thickening and inflammatory changes are seen involving most of the colon, most consistent with infectious or inflammatory colitis. 4. There are noted several small bowel loops in the left lower quadrant which demonstrate wall thickening suggesting enteritis. Aortic Atherosclerosis (ICD10-I70.0). Electronically Signed   By: Marijo Conception M.D.   On: 12/04/2020 19:33    Procedures Procedures (including critical care time)  Medications Ordered in ED Medications   ceFEPIme (MAXIPIME) 2 g in sodium chloride 0.9 % 100 mL IVPB (has no administration in time range)  vancomycin variable dose per unstable renal function (pharmacist dosing) (has no administration in time range)  docusate sodium (COLACE) capsule 100 mg (has no administration in time range)  polyethylene glycol (MIRALAX / GLYCOLAX) packet 17 g (has no administration in time range)  lactated ringers infusion ( Intravenous Stopped 12/04/2020 2314)  ondansetron (ZOFRAN) injection 4 mg (has no administration in time range)  pantoprazole (PROTONIX) injection 40 mg (40 mg Intravenous Given 11/14/2020 2252)  heparin injection 5,000 Units (5,000 Units Subcutaneous Given 11/18/2020 2251)  metroNIDAZOLE (FLAGYL) IVPB 500 mg ( Intravenous Infusion Verify 11/29/2020 2330)  sodium chloride 0.9 % bolus 1,000 mL (0 mLs Intravenous Stopped 11/15/2020 1754)  ceFEPIme (MAXIPIME) 2 g in sodium chloride 0.9 % 100 mL IVPB (0 g Intravenous Stopped 11/28/2020 1754)  metroNIDAZOLE (FLAGYL) IVPB 500 mg (0 mg Intravenous Stopped 12/06/2020 1942)  vancomycin (VANCOCIN) IVPB 1000 mg/200 mL premix (0 mg Intravenous Stopped 11/27/2020 2241)  sodium chloride 0.9 % bolus 1,000 mL (0 mLs Intravenous Stopped 11/12/2020 2058)    ED Course  I have reviewed the triage vital signs and the nursing notes.  Pertinent labs & imaging results that were available during my care of the patient were reviewed by me and considered in my medical decision making (see chart for details).    MDM Rules/Calculators/A&P                          This is a 70 year old female with a past medical history significant for peptic ulcer disease, GERD, hypertension, chronic hyponatremia, ascites, hypertension, left sacral alae fracture, metabolic encephalopathy, who presents emergency department after being found minimally responsive at home.  Son last spoke with the patient yesterday, EMS was called out after he did not hear from her and found her on the floor.   History is  severely limited as the patient cannot provide any history, EMS history Limited as above.  On arrival she is hypothermic to 14 Fahrenheit via rectal temperature, normotensive, normal heart rate, bilateral breath sounds, neurologically her GCS is 2/2/2: 6.  She is chronically ill-appearing and cachectic.  Her pupils are equal and sluggish.  She has some flexion posturing, does not withdraw to pain nor localize in any extremity.  She lays with both arms flexed at the elbow as well as legs flexed at the hip and knees.  There is ecchymosis with a likely contusion hematoma over the left frontal scalp with some dried blood with no active signs of bleeding on the head.  There is  a skin tear over the left elbow as well as the left hip and medial tib.  There is some swelling over the left ankle. Reevaluation shows the patient is GCS is 4/2/2: 8 as she is now spontaneously opening her eyes, although she does not track.  Concern for intracranial pathology such as subarachnoid versus subdural.  Also concern for cardiac event leading to unresponsiveness and prolonged downtime.  Given her severe hypothermia she was placed on a Bair hugger and given warm IV fluids in the form of sodium chloride as a bolus.  Reevaluated again around 1820, patient appears to be more awake, she opens her eyes to voice, she does follow commands by squeezing the left hand to command. GCS now 3/2/6: 11  Attending, Dr. Rex Kras, did speak with the brother who confirmed full CODE STATUS.  Does confirm that she was recently seen for a left sacral alla fracture, voices that she may have taken too much of the pain medication she was prescribed, I do not think that is the source of her altered mental status, I believe that this is more secondary to infection.  She does not have miotic pupils, is not bradypneic, and given the normal acetaminophen level, doubt overdose.  CT of the head, cervical spine, chest abdomen pelvis showed no acute traumatic  injuries, he does show some nonspecific inflammatory changes of the bowel which could be related to enteritis versus colitis had a bowel movement emergency department that was nonbloody, she was started on broad-spectrum antibiotic given the elevated lactic acid, altered mental status tachycardia.   Bedside ultrasound was performed that showed significant respiratory variation with collapse of the IVC intravascularly appears volume down, fluid resuscitation continued for total of 3 L in the emergency department.  Spoke with the intensivist regarding admission, patient was admitted for further care.  Final Clinical Impression(s) / ED Diagnoses Final diagnoses:  Fall    Rx / DC Orders ED Discharge Orders    None       Camila Li, MD 11/27/20 0045    Rex Kras Wenda Overland, MD 11/27/20 [phone]

## 2020-11-26 NOTE — ED Notes (Signed)
Notified Dr. Rex Kras of critical lactic acid of >11.

## 2020-11-26 NOTE — ED Triage Notes (Signed)
Pt BIB [facility] after being found down on the bathroom floor for uknown amount of time, pt's son last spoke to her late afternoon on 1/14 . Pt normally AOx4 for EMS reported a GCS of 7. Pt really only arousable to painful stimuli. Pt's CBG of 68 initially but given 10 g of D10  And improved to 184. All other VSS. Pt's temp reported by EMS to 9 F.

## 2020-11-26 NOTE — Progress Notes (Signed)
eLink Physician-Brief Progress Note Patient Name: [name] DOB: 12-04-1950 MRN: [id number]   Date of Service  2020-12-16  HPI/Events of Note  70 yr old female found down at home, low GCS, with severe sepsis with LA > 12. CT as below. Received 30 c/kg fluids/antibiotics. Now in ICU.  Recent CAP/encephalopathy/sacrum-coccyx # admission before christmas 2021. Covid was neg.   Hx of HTN, PEM, GERD, pre DM, CHF, Neurocognitive deficits.   Camera: HR 93, 100% sats on nasal o2, hypertensive.  Data: Trop 21 x 2 flat at 21. , LA 9.3 down from > 12. BNP 306. CK 1025.  UA rare bacteria, Nitrite/Leukocytes neg. Co2 low at 10, Cr 2.36( baseline normal), K 4.7, AG 29. AGMA. PH 7.29. WBC 27 K.  Covid : Tox cocaine + CT-scan of the abdomen/chest: 1. Large sliding-type hiatal hernia. 2. Hepatic steatosis. 3. Wall thickening and inflammatory changes are seen involving most of the colon, most consistent with infectious or inflammatory colitis. 4. There are noted several small bowel loops in the left lower quadrant which demonstrate wall thickening suggesting enteritis.  CTH neg.    APp: 1. Severe Sepsis: Source not clear could be colitis/enetritis.  Elevated LA and WBC, UA neg. No PNA.  2. Encephalopathy. From sepsis and AKI. 3. AGMA/from above, could be bicarb loss from GI.   eICU Interventions  - follow cl difficle/ Covid RT PCR. - I/O - keep MAP > 65 - follow LA. - asp precautions - Sq heparin.  - on Vanc/cef/flagyl. On Protonix. - consider bicarb if acidosis not getting better.      Intervention Category Major Interventions: Sepsis - evaluation and management;Change in mental status - evaluation and management Evaluation Type: New Patient Evaluation  Elmer Sow 2020/12/16, 9:37 PM

## 2020-11-26 NOTE — Progress Notes (Signed)
Pharmacy Antibiotic Note  [name] is a 71 y.o. female admitted on Nov 29, 2020 with sepsis.  Pharmacy has been consulted for Cefepime and Vancomycin dosing.   Height: 5\' 2"  (157.5 cm) Weight: 40 kg (88 lb 2.9 oz) IBW/kg (Calculated) : 50.1  Temp (24hrs), Avg:94.7 F (34.8 C), Min:91.3 F (32.9 C), Max:96.8 F (36 C)  Recent Labs  Lab 2020/11/29 1636  WBC 27.3*  CREATININE 2.36*  LATICACIDVEN >11.0*    Estimated Creatinine Clearance: 14.6 mL/min (A) (by C-G formula based on SCr of 2.36 mg/dL (H)).    Allergies  Allergen Reactions  . Iodine Shortness Of Breath and Rash  . Thiazide-Type Diuretics Other (See Comments)    Severe hyponatremia July 2021  . Shellfish Allergy     Causes rash    Antimicrobials this admission: 1/15 Cefepime >>  1/15 Vancomycin >>   Dose adjustments this admission: N/a  Microbiology results: Pending   Plan:  - Cefepime 2g IV q24h - Vancomycin 1000mg  IV x 1 dose  - Will dose vancomycin by random levels due to AKI  - Monitor patients renal function and urine output  - De-escalate ABX when appropriate   Thank you for allowing pharmacy to be a part of this patient's care.  Duanne Limerick PharmD. BCPS 2020-11-29 6:43 PM

## 2020-11-26 NOTE — ED Notes (Signed)
Second set of blood cultures obtained after initial abx started

## 2020-11-27 ENCOUNTER — Inpatient Hospital Stay (HOSPITAL_COMMUNITY): Payer: Medicare Other

## 2020-11-27 ENCOUNTER — Other Ambulatory Visit: Payer: Self-pay

## 2020-11-27 ENCOUNTER — Encounter (HOSPITAL_COMMUNITY): Payer: Self-pay | Admitting: Pulmonary Disease

## 2020-11-27 DIAGNOSIS — R4182 Altered mental status, unspecified: Secondary | ICD-10-CM

## 2020-11-27 DIAGNOSIS — G9341 Metabolic encephalopathy: Secondary | ICD-10-CM

## 2020-11-27 LAB — CK TOTAL AND CKMB (NOT AT ARMC)
CK, MB: 55.7 ng/mL — ABNORMAL HIGH (ref 0.5–5.0)
CK, MB: 47.4 ng/mL — ABNORMAL HIGH (ref 0.5–5.0)
Relative Index: 3.4 — ABNORMAL HIGH (ref 0.0–2.5)
Relative Index: 3.8 — ABNORMAL HIGH (ref 0.0–2.5)
Total CK: 1648 U/L — ABNORMAL HIGH (ref 38–234)
Total CK: 1262 U/L — ABNORMAL HIGH (ref 38–234)

## 2020-11-27 LAB — CBC
HCT: 27.9 % — ABNORMAL LOW (ref 36.0–46.0)
Hemoglobin: 9 g/dL — ABNORMAL LOW (ref 12.0–15.0)
MCH: 23.1 pg — ABNORMAL LOW (ref 26.0–34.0)
MCHC: 32.3 g/dL (ref 30.0–36.0)
MCV: 71.5 fL — ABNORMAL LOW (ref 80.0–100.0)
Platelets: 483 10*3/uL — ABNORMAL HIGH (ref 150–400)
RBC: 3.9 MIL/uL (ref 3.87–5.11)
RDW: 16.4 % — ABNORMAL HIGH (ref 11.5–15.5)
WBC: 21 10*3/uL — ABNORMAL HIGH (ref 4.0–10.5)
nRBC: 0.6 % — ABNORMAL HIGH (ref 0.0–0.2)

## 2020-11-27 LAB — BASIC METABOLIC PANEL
Anion gap: 18 — ABNORMAL HIGH (ref 5–15)
BUN: 37 mg/dL — ABNORMAL HIGH (ref 8–23)
CO2: 16 mmol/L — ABNORMAL LOW (ref 22–32)
Calcium: 8.2 mg/dL — ABNORMAL LOW (ref 8.9–10.3)
Chloride: 101 mmol/L (ref 98–111)
Creatinine, Ser: 2.28 mg/dL — ABNORMAL HIGH (ref 0.44–1.00)
GFR, Estimated: 23 mL/min — ABNORMAL LOW (ref >60)
Glucose, Bld: 150 mg/dL — ABNORMAL HIGH (ref 70–99)
Potassium: 4.4 mmol/L (ref 3.5–5.1)
Sodium: 135 mmol/L (ref 135–145)

## 2020-11-27 LAB — POCT I-STAT 7, (LYTES, BLD GAS, ICA,H+H)
Acid-base deficit: 4 mmol/L — ABNORMAL HIGH (ref 0.0–2.0)
Bicarbonate: 20.4 mmol/L (ref 20.0–28.0)
Calcium, Ion: 1.12 mmol/L — ABNORMAL LOW (ref 1.15–1.40)
HCT: 30 % — ABNORMAL LOW (ref 36.0–46.0)
Hemoglobin: 10.2 g/dL — ABNORMAL LOW (ref 12.0–15.0)
O2 Saturation: 96 %
Patient temperature: 99.2
Potassium: 3.7 mmol/L (ref 3.5–5.1)
Sodium: 132 mmol/L — ABNORMAL LOW (ref 135–145)
TCO2: 21 mmol/L — ABNORMAL LOW (ref 22–32)
pCO2 arterial: 33.7 mmHg (ref 32.0–48.0)
pH, Arterial: 7.392 (ref 7.350–7.450)
pO2, Arterial: 87 mmHg (ref 83.0–108.0)

## 2020-11-27 LAB — PROCALCITONIN: Procalcitonin: 3.51 ng/mL

## 2020-11-27 LAB — C DIFFICILE QUICK SCREEN W PCR REFLEX
C Diff antigen: NEGATIVE
C Diff interpretation: NOT DETECTED
C Diff toxin: NEGATIVE

## 2020-11-27 LAB — MAGNESIUM: Magnesium: 1.8 mg/dL (ref 1.7–2.4)

## 2020-11-27 LAB — MRSA PCR SCREENING: MRSA by PCR: NEGATIVE

## 2020-11-27 LAB — PHOSPHORUS: Phosphorus: 6 mg/dL — ABNORMAL HIGH (ref 2.5–4.6)

## 2020-11-27 LAB — AMMONIA: Ammonia: 41 umol/L — ABNORMAL HIGH (ref 9–35)

## 2020-11-27 MED ORDER — ACETAMINOPHEN 650 MG RE SUPP
650.0000 mg | RECTAL | Status: DC | PRN
  Administered 2020-11-27: 650 mg via RECTAL
  Filled 2020-11-27: qty 1

## 2020-11-27 MED ORDER — HALOPERIDOL LACTATE 5 MG/ML IJ SOLN
4.0000 mg | Freq: Once | INTRAMUSCULAR | Status: AC
  Administered 2020-11-27: 4 mg via INTRAVENOUS
  Filled 2020-11-27: qty 1

## 2020-11-27 MED ORDER — LORAZEPAM 2 MG/ML IJ SOLN
2.0000 mg | Freq: Once | INTRAMUSCULAR | Status: AC
  Administered 2020-11-27: 2 mg via INTRAVENOUS
  Filled 2020-11-27: qty 1

## 2020-11-27 MED ORDER — PIPERACILLIN-TAZOBACTAM 3.375 G IVPB
3.3750 g | Freq: Two times a day (BID) | INTRAVENOUS | Status: DC
  Administered 2020-11-27 – 2020-11-29 (×5): 3.375 g via INTRAVENOUS
  Filled 2020-11-27 (×5): qty 50

## 2020-11-27 MED ORDER — MAGNESIUM SULFATE 2 GM/50ML IV SOLN
2.0000 g | Freq: Once | INTRAVENOUS | Status: AC
  Administered 2020-11-27: 2 g via INTRAVENOUS
  Filled 2020-11-27: qty 50

## 2020-11-27 MED ORDER — CHLORHEXIDINE GLUCONATE CLOTH 2 % EX PADS
6.0000 | MEDICATED_PAD | Freq: Every day | CUTANEOUS | Status: DC
  Administered 2020-11-27 – 2020-11-30 (×3): 6 via TOPICAL

## 2020-11-27 NOTE — Plan of Care (Signed)
  Problem: Education: Goal: Ability to demonstrate management of disease process will improve Outcome: Progressing Goal: Ability to verbalize understanding of medication therapies will improve Outcome: Progressing   

## 2020-11-27 NOTE — Progress Notes (Signed)
Made Dr. Tamala Julian aware about fever and increase in activity. Wrote new orders. Patient will continue to be monitored.

## 2020-11-27 NOTE — Progress Notes (Signed)
NAME:  [name], MRN:  [id number], DOB:  [birth year], LOS: 1 ADMISSION DATE:  12/21/20,   CHIEF COMPLAINT: Altered mental status  Brief History:  70 year old female who presented with altered mental status from home  History of Present Illness:  This is a 70 year old white female that presented to the emergency room via EMS from home.  Patient's brother had not been in contact with her for approximately 24 hours and called police to do a wellness check.  She was found minimally responsive on the floor.  No history is available about events that led up to finding her on the floor.  She is unable to provide any history.  She was discharged from the hospital on 24 October 2020 following admission for acute metabolic encephalopathy and community-acquired pneumonia.  Past Medical History:  Community-acquired pneumonia 78/29 Metabolic encephalopathy 56/21 Sacrum and coccyx fracture 12/21 Hypertension GERD/peptic ulcer disease Syncope Chronic hyponatremia   Significant Diagnostic Tests:  CT head: IMPRESSION: No acute intracranial pathology  CT scan cervical spine: IMPRESSION: 1. Small deformity of indeterminate age involving an anterior osteophyte along the inferior endplate of the C3 vertebral body. MRI correlation is recommended. 2. Marked severity degenerative changes at the levels of C4-C5 and C5-C6. 3. Approximately 1 mm to 2 mm retrolisthesis of the C3 on C4 vertebral body.  CT abdomen: IMPRESSION: 1. Large sliding-type hiatal hernia. 2. Hepatic steatosis. 3. Wall thickening and inflammatory changes are seen involving most of the colon, most consistent with infectious or inflammatory colitis. 4. There are noted several small bowel loops in the left lower quadrant which demonstrate wall thickening suggesting enteritis.  Aortic Atherosclerosis (ICD10-I70.0).   Micro Data:  Influenza: Negative COVID: Negative  Antimicrobials:  Vancomycin x  1 Cefepime Flagyl  Objective   Blood pressure (!) 118/105, pulse 83, temperature 99.5 F (37.5 C), temperature source Axillary, resp. rate 17, height 5\' 2"  (1.575 m), weight 41.2 kg, SpO2 97 %.        Intake/Output Summary (Last 24 hours) at 11/27/2020 0738 Last data filed at 11/27/2020 3086 Gross per 24 hour  Intake 2188.64 ml  Output 240 ml  Net 1948.64 ml   Filed Weights   Dec 21, 2020 1700 11/27/20 0449  Weight: 40 kg 41.2 kg    Examination: Constitutional: thin woman in NAD lying in bed  Eyes: briskly reactive, not tracking Ears, nose, mouth, and throat: c collar in place, MMM Cardiovascular: RRR, harsh systolic murmur, ext warm Respiratory: scattered transmitted upper airway sounds, mild tachypnea Gastrointestinal: Soft, +BS Skin: No rashes, normal turgor Neurologic: I cannot get her to move her left side, she will move R side to pain Ext: contracted, muscle wasting, bruising Psychiatric: RASS -2  CK stable AKI stable CXR benign Thrombocytosis slightly better  Assessment & Plan:  -Shock- hypovolemic vs. Septic.  Improved with fluids. 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  She does not want feeding tube or long term SNF it appears.  I think she would benefit from hospice at any time.  Will ask palliative to re-engage brother and try to get him to understand patient's wishes as stated in Oct 2021 admission.  Best  practice (evaluated daily)  Diet: N.p.o. Pain/Anxiety/Delirium protocol (if indicated): n/a VAP protocol (if indicated): N/A DVT prophylaxis: Heparin subcu GI prophylaxis: Protonix Glucose control: Monitor blood sugar Mobility: Bedrest Disposition: Admit to ICU  Goals of Care:   Called brother 1/16: went to VM, pending at this time Full code per EMD discussion with son 1/15   Patient critically ill due to acute metabolic encephalopathy Interventions to address this today EEG, MRI, hydration Risk of deterioration without these interventions is high  I personally spent 36 minutes providing critical care not including any separately billable procedures  Erskine Emery MD [facility] 11/27/2020 8:26 AM Personal pager: [phone] If unanswered, please page CCM On-call: [phone]

## 2020-11-27 NOTE — Progress Notes (Signed)
[facility] Progress Note Patient Name: [name] DOB: [birth year] MRN: [id number]   Date of Service  11/27/2020  HPI/Events of Note  Dr Hortense Ramal: chat. Consider changing cefpime ( EEG epileptiform changes- from cefpime) to other abx. Received ativan as per bed side RN, camera eval.  eICU Interventions  - sz precautions - DC ed cefepime . Starting zosyn. If recurs call Neuro     Intervention Category Major Interventions: Seizures - evaluation and management Intermediate Interventions: Diagnostic test evaluation  Elmer Sow 11/27/2020, 8:03 PM

## 2020-11-27 NOTE — Plan of Care (Signed)
  Problem: Education: Goal: Ability to demonstrate management of disease process will improve Outcome: Progressing Goal: Ability to verbalize understanding of medication therapies will improve Outcome: Progressing   Problem: Education: Goal: Knowledge of General Education information will improve Description: Including pain rating scale, medication(s)/side effects and non-pharmacologic comfort measures Outcome: Progressing   Problem: Health Behavior/Discharge Planning: Goal: Ability to manage health-related needs will improve Outcome: Progressing   Problem: Clinical Measurements: Goal: Ability to maintain clinical measurements within normal limits will improve Outcome: Progressing Goal: Will remain free from infection Outcome: Progressing Goal: Diagnostic test results will improve Outcome: Progressing Goal: Respiratory complications will improve Outcome: Progressing Goal: Cardiovascular complication will be avoided Outcome: Progressing   Problem: Activity: Goal: Risk for activity intolerance will decrease Outcome: Progressing   Problem: Nutrition: Goal: Adequate nutrition will be maintained Outcome: Progressing   Problem: Coping: Goal: Level of anxiety will decrease Outcome: Progressing   Problem: Elimination: Goal: Will not experience complications related to bowel motility Outcome: Progressing Goal: Will not experience complications related to urinary retention Outcome: Progressing   Problem: Pain Managment: Goal: General experience of comfort will improve Outcome: Progressing   Problem: Safety: Goal: Ability to remain free from injury will improve Outcome: Progressing   Problem: Skin Integrity: Goal: Risk for impaired skin integrity will decrease Outcome: Progressing

## 2020-11-27 NOTE — Procedures (Signed)
Patient Name: [name]  MRN: [id number]  Epilepsy Attending: Lora Havens  Referring Physician/Provider: Dr Ina Homes Date: 11/28/2019 Duration: 24.46 mins  Patient history: 70yo F with AMS. EEG to evaluate for seizure.  Level of alertness:  lethargic  AEDs during EEG study: None  Technical aspects: This EEG study was done with scalp electrodes positioned according to the 10-20 International system of electrode placement. Electrical activity was acquired at a sampling rate of 500Hz  and reviewed with a high frequency filter of 70Hz  and a low frequency filter of 1Hz . EEG data were recorded continuously and digitally stored.   Description: EEG showed continuous generalized 3 to 6 Hz theta-delta slowing. Generalized periodic epileptiform discharges were noted at 0.5hz , at times in runs lasting 2 seconds followed by brief periods of generalized attenuation lasting 1-2 seconds. Hyperventilation and photic stimulation were not performed.     ABNORMALITY -Continuous slow, generalized -Periodic epileptiform discharges, generalized  IMPRESSION: This study showed evidence of generalized epileptogenicity non specific etiology but could be secondary to cefepime toxicity. There is also severe diffuse encephalopathy, non specific etiology. No seizures were seen throughout the recording.  Ziair Penson Barbra Sarks

## 2020-11-28 DIAGNOSIS — Z515 Encounter for palliative care: Secondary | ICD-10-CM

## 2020-11-28 DIAGNOSIS — G9341 Metabolic encephalopathy: Secondary | ICD-10-CM | POA: Diagnosis not present

## 2020-11-28 DIAGNOSIS — G9340 Encephalopathy, unspecified: Secondary | ICD-10-CM | POA: Diagnosis not present

## 2020-11-28 DIAGNOSIS — Z7189 Other specified counseling: Secondary | ICD-10-CM

## 2020-11-28 DIAGNOSIS — R4182 Altered mental status, unspecified: Secondary | ICD-10-CM | POA: Diagnosis not present

## 2020-11-28 LAB — MAGNESIUM: Magnesium: 2.9 mg/dL — ABNORMAL HIGH (ref 1.7–2.4)

## 2020-11-28 LAB — CBC
HCT: 25 % — ABNORMAL LOW (ref 36.0–46.0)
Hemoglobin: 8 g/dL — ABNORMAL LOW (ref 12.0–15.0)
MCH: 23.1 pg — ABNORMAL LOW (ref 26.0–34.0)
MCHC: 32 g/dL (ref 30.0–36.0)
MCV: 72 fL — ABNORMAL LOW (ref 80.0–100.0)
Platelets: 376 10*3/uL (ref 150–400)
RBC: 3.47 MIL/uL — ABNORMAL LOW (ref 3.87–5.11)
RDW: 16.2 % — ABNORMAL HIGH (ref 11.5–15.5)
WBC: 17.6 10*3/uL — ABNORMAL HIGH (ref 4.0–10.5)
nRBC: 0.3 % — ABNORMAL HIGH (ref 0.0–0.2)

## 2020-11-28 LAB — GLUCOSE, CAPILLARY
Glucose-Capillary: 102 mg/dL — ABNORMAL HIGH (ref 70–99)
Glucose-Capillary: 89 mg/dL (ref 70–99)

## 2020-11-28 LAB — BASIC METABOLIC PANEL
Anion gap: 14 (ref 5–15)
BUN: 38 mg/dL — ABNORMAL HIGH (ref 8–23)
CO2: 20 mmol/L — ABNORMAL LOW (ref 22–32)
Calcium: 8.1 mg/dL — ABNORMAL LOW (ref 8.9–10.3)
Chloride: 106 mmol/L (ref 98–111)
Creatinine, Ser: 1.94 mg/dL — ABNORMAL HIGH (ref 0.44–1.00)
GFR, Estimated: 28 mL/min — ABNORMAL LOW (ref >60)
Glucose, Bld: 94 mg/dL (ref 70–99)
Potassium: 3.6 mmol/L (ref 3.5–5.1)
Sodium: 140 mmol/L (ref 135–145)

## 2020-11-28 LAB — URINE CULTURE: Culture: NO GROWTH

## 2020-11-28 LAB — PHOSPHORUS: Phosphorus: 4.5 mg/dL (ref 2.5–4.6)

## 2020-11-28 MED ORDER — VALPROATE SODIUM 100 MG/ML IV SOLN
500.0000 mg | Freq: Three times a day (TID) | INTRAVENOUS | Status: DC
  Administered 2020-11-28 – 2020-11-30 (×6): 500 mg via INTRAVENOUS
  Filled 2020-11-28 (×9): qty 5

## 2020-11-28 MED ORDER — THIAMINE HCL 100 MG/ML IJ SOLN
100.0000 mg | INTRAMUSCULAR | Status: DC
  Filled 2020-11-28: qty 2

## 2020-11-28 MED ORDER — THIAMINE HCL 100 MG/ML IJ SOLN
500.0000 mg | Freq: Three times a day (TID) | INTRAVENOUS | Status: AC
  Administered 2020-11-28 – 2020-11-30 (×6): 500 mg via INTRAVENOUS
  Filled 2020-11-28 (×6): qty 5

## 2020-11-28 MED ORDER — LEVETIRACETAM IN NACL 1000 MG/100ML IV SOLN
1000.0000 mg | Freq: Two times a day (BID) | INTRAVENOUS | Status: DC
  Administered 2020-11-29 – 2020-11-30 (×4): 1000 mg via INTRAVENOUS
  Filled 2020-11-28 (×4): qty 100

## 2020-11-28 MED ORDER — SODIUM CHLORIDE 0.9 % IV SOLN
2000.0000 mg | Freq: Once | INTRAVENOUS | Status: AC
  Administered 2020-11-28: 2000 mg via INTRAVENOUS
  Filled 2020-11-28: qty 20

## 2020-11-28 MED ORDER — VALPROATE SODIUM 100 MG/ML IV SOLN
1600.0000 mg | INTRAVENOUS | Status: AC
  Administered 2020-11-28: 1600 mg via INTRAVENOUS
  Filled 2020-11-28: qty 16

## 2020-11-28 MED ORDER — POTASSIUM CHLORIDE 10 MEQ/50ML IV SOLN
10.0000 meq | INTRAVENOUS | Status: AC
  Filled 2020-11-28: qty 50

## 2020-11-28 MED ORDER — DEXMEDETOMIDINE HCL IN NACL 400 MCG/100ML IV SOLN
0.4000 ug/kg/h | INTRAVENOUS | Status: DC
  Administered 2020-11-28: 0.5 ug/kg/h via INTRAVENOUS
  Administered 2020-11-28: 0.7 ug/kg/h via INTRAVENOUS
  Administered 2020-11-30: 0.6 ug/kg/h via INTRAVENOUS
  Filled 2020-11-28 (×4): qty 100

## 2020-11-28 MED ORDER — LORAZEPAM 2 MG/ML IJ SOLN
2.0000 mg | INTRAMUSCULAR | Status: DC | PRN
  Administered 2020-11-28 – 2020-11-29 (×3): 2 mg via INTRAVENOUS
  Filled 2020-11-28 (×4): qty 1

## 2020-11-28 NOTE — Consult Note (Signed)
Neurology Consultation  Reason for Consult: seizure like activity, AMS Referring Physician: Erskine Emery, MD, PCCM  CC: AMS with ? seizure activity  History is obtained from: chart  HPI: [name] is a 70 y.o. female with a PMHx of SIADH, syncope, HTN, anxiety, and GERD who has had 3 hospitalizations in one month. Patient was admitted 10/13/20 after being hit pushed down by a dog and suffered a sacrum and coccyx fx. She was sent home (to brother's house) with DME and PT/OT. Meds on discharge included opiates and baclofen in addition to her home meds of Gabapentin, Vistaril, and Trazodone.   Her next admission was 10/21/20 for encephalopathy and brother felt this was due to overusing her opioids from past discharge. Also, at that time, she had experienced poor po intake due to n/v. CTH showed chronic microvascular ischemic disease but no acute abnormality. Her AMS was thought to be due to polypharmacy and resolved prior to discharge. She was found to have CAP and sent home on Cefdinir and azithromycin and reduced Baclofen dose from previous discharge above.   Then patient returned to [facility] ED after no contact with brother over 24 hrs and was found minimally responsive on the floor on wellness check by police. On admission she was found to have severe lactic acidosis, septic shock, AKI, metobolic encephalopathy, severe PTM, diarrhea with concern for Cdiff, but stool was negative. Patient was admitted by PCCM. She has required use of Precedex due to agitation. EEG was performed due to concern for seizure activity on 11/27/20 which showed generalized epileptogenicity of non specific etiology, severe diffuse encephalopathy, but no noted seizures. Concern for Cefepime toxicity and this was d/c'd. LTM was placed and today's reading shows improvement over yesterday but with a pattern of GPEDs. She continues to receive antibiotics-Zosyn and Flagyl. It does not appear that patient has had not received  narcotics,Trazodone,Gabapentin, or Baclofen this hospitalization. Per MAR, she has had dose of Haldol on 11/27/20.   Due to continued AMS on exam, neurology was asked to consult.   ROS: Unable to obtain due to altered mental status.   Past Medical History:  Diagnosis Date  . Abdominal pain, epigastric   . Acid reflux    Pt was born with acute acid  . AKI (acute kidney injury) ([place]) 09/15/2020  . Anxiety   . GERD (gastroesophageal reflux disease)   . Heart murmur   . Hiatal hernia   . Hypertension   . Hyponatremia 02/10/2020  . Schatzki's ring   . Ulcer    8 years ago    Family History  Problem Relation Age of Onset  . Prostate cancer Father   . Anxiety disorder Mother   . Colon cancer Maternal Grandmother    Social History:   reports that she has been smoking cigarettes. She has been smoking about 0.50 packs per day. She has never used smokeless tobacco. She reports that she does not drink alcohol and does not use drugs.  Medications  Current Facility-Administered Medications:  .  acetaminophen (TYLENOL) suppository 650 mg, 650 mg, Rectal, Q4H PRN, Candee Furbish, MD, 650 mg at 11/27/20 1744 .  Chlorhexidine Gluconate Cloth 2 % PADS 6 each, 6 each, Topical, Daily, Deland Pretty, MD, 6 each at 11/27/20 1016 .  dexmedetomidine (PRECEDEX) 400 MCG/100ML (4 mcg/mL) infusion, 0.4-1.2 mcg/kg/hr, Intravenous, Titrated, Mohan, Kinila T, MD, Last Rate: 7.21 mL/hr at 11/28/20 0426, 0.7 mcg/kg/hr at 11/28/20 0426 .  docusate sodium (COLACE) capsule 100 mg, 100 mg,  Oral, BID PRN, Deland Pretty, MD .  heparin injection 5,000 Units, 5,000 Units, Subcutaneous, Q8H, Deland Pretty, MD, 5,000 Units at 11/28/20 [phone] .  lactated ringers infusion, , Intravenous, Continuous, Deland Pretty, MD, Last Rate: 100 mL/hr at 11/28/20 0329, Infusion Verify at 11/28/20 0329 .  ondansetron (ZOFRAN) injection 4 mg, 4 mg, Intravenous, Q6H PRN, Deland Pretty, MD .   pantoprazole (PROTONIX) injection 40 mg, 40 mg, Intravenous, QHS, Deland Pretty, MD, 40 mg at 11/27/20 2128 .  piperacillin-tazobactam (ZOSYN) IVPB 3.375 g, 3.375 g, Intravenous, Q12H, Mohan, Kinila T, MD, Last Rate: 12.5 mL/hr at 11/28/20 1019, 3.375 g at 11/28/20 1019 .  polyethylene glycol (MIRALAX / GLYCOLAX) packet 17 g, 17 g, Oral, Daily PRN, Deland Pretty, MD .  potassium chloride 10 mEq in 50 mL *CENTRAL LINE* IVPB, 10 mEq, Intravenous, Q1 Hr x 4, Candee Furbish, MD .  thiamine 500mg  in normal saline (32ml) IVPB, 500 mg, Intravenous, Q8H **FOLLOWED BY** [START ON Dec 17, 2020] thiamine (B-1) injection 100 mg, 100 mg, Intravenous, Q24H, Candee Furbish, MD   Exam: Current vital signs: BP 129/68   Pulse 66   Temp 98.9 F (37.2 C) (Axillary)   Resp 14   Ht 5\' 2"  (1.575 m)   Wt 41.2 kg   SpO2 98%   BMI 16.61 kg/m  Vital signs in last 24 hours: Temp:  [97.2 F (36.2 C)-100.3 F (37.9 C)] 98.9 F (37.2 C) (01/17 0730) Pulse Rate:  [34-126] 66 (01/17 0300) Resp:  [13-31] 14 (01/17 0300) BP: (96-177)/(46-88) 129/68 (01/17 0300) SpO2:  [76 %-100 %] 98 % (01/17 0300)  GENERAL: Critically ill, cachetic female who is writhing in bed.  Eyes: non-injected sclera HEENT: - Normocephalic and atraumatic LUNGS - Mild tachypnea, but no use of accessory muscles. On 02.  CV - RRR on tele ABDOMEN - Soft, nontender Ext: warm, well perfused  NEURO:  GCS:   Best motor: 4     Best verbal: 2    Best eye: 2    Total: 8 Mental Status: Pt occasionally will open eyes to noxious stimuli. She is non verbal and does not attempt to mouth words. She does not follow commands.  Speech/Language: non verbal.   Cranial Nerves:  II: OD pupil is round and sluggish. 6mm. OS pupil is oval, 53mm and sluggish. No tracking of examiner.  III, IV, VI: Eyes are midline. Head not moved side to side due to Cspine collar. V, VII: No blink to threat. + intact corneal reflexes OU.  VIII: no response to  voice or loud clapping.  IX, X: + weak cough. + gag reflex with suctioning.   XI: Head is grossly midline.  VII: Unable to test. Pt will not open mouth to command.   Motor: Muscle atrophy noted to BLEs. No deformities. Bulk is decreased. . Unable to test strength. Patient with stereotyped activity consisteing of repeated pelvic thrusting throughout the exam/  Sensation: Does not withdraw purposefully to pain.  Cerebellar: No tremors or clonus.  DTRs: unable to elicit due to seizure like activity.  Gait- deferred  Labs CBC    Component Value Date/Time   WBC 17.6 (H) 11/28/2020 0055   RBC 3.47 (L) 11/28/2020 0055   HGB 8.0 (L) 11/28/2020 0055   HCT 25.0 (L) 11/28/2020 0055   PLT 376 11/28/2020 0055   MCV 72.0 (L) 11/28/2020 0055   MCH 23.1 (L) 11/28/2020 0055   MCHC 32.0 11/28/2020 0055   RDW 16.2 (  H) 11/28/2020 0055   LYMPHSABS 0.9 11/29/2020 1636   MONOABS 2.1 (H) 11/29/2020 1636   EOSABS 0.0 12/08/2020 1636   BASOSABS 0.1 11/23/2020 1636   CMP     Component Value Date/Time   NA 140 11/28/2020 0055   NA 131 (A) 09/29/2020 0000   K 3.6 11/28/2020 0055   CL 106 11/28/2020 0055   CO2 20 (L) 11/28/2020 0055   GLUCOSE 94 11/28/2020 0055   BUN 38 (H) 11/28/2020 0055   BUN 10 09/29/2020 0000   CREATININE 1.94 (H) 11/28/2020 0055   CALCIUM 8.1 (L) 11/28/2020 0055   PROT 6.2 (L) 11/13/2020 1636   ALBUMIN 3.7 11/14/2020 1636   AST 461 (H) 12/06/2020 1636   ALT 154 (H) 12/04/2020 1636   ALKPHOS 156 (H) 12/06/2020 1636   BILITOT 0.6 12/06/2020 1636   GFRNONAA 28 (L) 11/28/2020 0055   GFRAA 90 09/29/2020 0000   Imaging 11/29/2020--CT-scan of the head showed no acute intracranial pathology.  11/27/20 MRI examination of the brain 1. No acute intracranial abnormality. 2. Stable noncontrast MRI appearance of the brain from last month. Moderate for age nonspecific cerebral white matter signal changes, most commonly due to chronic small vessel disease. Possible tiny chronic  infarct in the left cerebellum.  Assessment: 70 yo female who apparently was living at home prior to several hospital admissions in Dec 2021. She did stay with her brother for awhile and discharge for the fractures. On this admission, she was found down by police and diagnosed with septic shock vs hypovolemic shock, lactic acidosis, rhabdomyolysis, metabolic derangements, and colitis. She began to have writhing movements in bed and there was concern whether this could be seizure activity. CTH was negative for acute and MRI brain was stable over last month's finding.  Given indeterminate EEG pattern, concern for seizures and will start treatment.   Recommendations: -Ativan 2mg  now-given -Keppra load 2gms IV now-ordered. -Followed by Jodi Marble 1 gm IV q12-ordered.  -seizure precautions. -continued correction of metabolic derangements, infection, and Vitamin deficiency.   Pt seen by Clance Boll, NP/Neuro and later by MD. Note/plan to be edited by MD as needed.  Pager: [phone]  I have seen the patient and reviewed the above note.  That note represents joint evaluation with care plan developed primarily by me.  With periodic discharges on EEG, and no other definite etiology for her altered mental status, as well as with the stereotyped movements I am concerned this may represent ongoing nonconvulsive seizure activity, though this is not definite.  I do not think I would pursue this to intubation/burst suppression at the current time, but I would favor starting treatment with Keppra, due to her small stature we will start with 2 g load and 1 g twice daily.  I will also give Ativan 2 mg and then reassess.  Neurology will continue to follow.  This patient is critically ill and at significant risk of neurological worsening, death and care requires constant monitoring of vital signs, hemodynamics,respiratory and cardiac monitoring, neurological assessment, discussion with family, other specialists  and medical decision making of high complexity. I spent 50 minutes of neurocritical care time  in the care of  this patient. This was time spent independent of any time provided by nurse practitioner or PA.  Roland Rack, MD [facility] [phone]  If 7pm- 7am, please page neurology on call as listed in [place]. 11/28/2020  12:02 PM

## 2020-11-28 NOTE — Consult Note (Signed)
Consultation Note Date: 11/28/2020   Patient Name: [name]  DOB: 20-Jan-1951  MRN: [id number]  Age / Sex: 70 y.o., female   PCP: Nicholes Rough, PA-C Referring Physician: Candee Furbish, MD   REASON FOR CONSULTATION:Establishing goals of care  Palliative Care consult requested for goals of care discussion in this 70 y.o. female with multiple medical problems including hypertension, GERD, hyponatremia, pneumonia, and encephalopathy during previous hospital admission (10/2020). Patient presented to the ER via EMS from home after brother was concerned and requested a well check visit given no contact with patient for over 24hrs. Patient was found on bathroom floor minimally responsive. Since admission she has been treated for shock. Continues to have persistent encephalopathy. Patient is severely malnourished as observed on admission.   Clinical Assessment and Goals of Care: I have reviewed medical records including lab results, imaging, Epic notes, and MAR, received report from the bedside RN. I spoke via phone with patient's brother, [name] to discuss diagnosis prognosis, [place], EOL wishes, disposition and options.  I introduced Palliative Medicine as specialized medical care for people living with serious illness. It focuses on providing relief from the symptoms and stress of a serious illness. The goal is to improve quality of life for both the patient and the family. Brother verbalized understanding and appreciation of support. Patient is known to our team from previous admission in October 2021.   We discussed a brief life review of the patient, along with her functional and nutritional status. [name] reports prior to admission patient lived in the home alone. She does not have any children and he is the only family she has. He reports he checks in on her physically several times a month, however they speak by phone several times per day.   [name] reports patient was able to  provide care independently including driving. He does confirm a drastic decline in her health over the past 6 mos including significant weight loss. He reports patient would often choke on certain foods, complain of constant full feeling which she related to her large hiatal hernia.   We discussed Her current illness and what it means in the larger context of Her on-going co-morbidities.Natural disease trajectory and expectations at EOL were discussed.  [name] shares he is unsure what is causing all of patient's complications and health decline. He states she always referenced her hernia as the "culprit" but did not want to pursue options to resolve. He shares his concerns with her health decline and ability to "bounce back" from all of this. Emotional support provided.   I used this opportunity to encourage [name] to focus on patient's quality of life, known decline while eliciting values and goals of care important to the patient.    I reviewed with [name] previous goals of care discussions in October when patient was able to express wishes.   I discussed at length patient's full code status with consideration to her current illness and previously expressed wishes for DNR/DNI. Mr. Broman verbalized understanding however, he expressed he has spoken to patient about her wishes in the past and she expressed she would be ok with heroic measures and attempts. If she showed no improvement or unable to survive after attempts she would then be ok to let her pass away. We also discussed her wishes for no artificial feeding and temporary SNF placement.   He expresses he is torn to hear her wishes and wonders if she told him she  would want those measures to spare his feelings. We discussed sometimes loved ones do spare feelings especially knowing that they are the only ones each other have. He verbalized understanding.  He states he wishes to continue with all treatment with hopes that she will become more  awake and alert with the ability to express her wishes. I emphasized to brother although he remains hopeful, I would encourage him to consider decisions on her behalf as it is likely she may not be able to make necessary decisions. I encouraged [name] to focus on patient's quality of life. He verbalized understanding.   [name] reports he does not feel that patient is safe to return home alone and unfortunately he is not able to care for her due to his own health needs. He becomes emotional sharing his upcoming surgery and medical treatments for prostate concerns, possible cancer. Support provided.   He states if she does survive she would need some sort of placement either temporarily or long-term.    The difference between aggressive medical intervention and comfort care was considered in light of the patient's goals of care. I educated brother on what comfort care measures would look like.   [name] states he is concerned about patient and the possible need for hospice at some point, if she continues in her current state of health. We discussed outpatient hospice including the hospice home. He verbalized understanding.   Questions and concerns were addressed. The family was encouraged to call with questions or concerns.  PMT will continue to support holistically.   SOCIAL HISTORY:     reports that she has been smoking cigarettes. She has been smoking about 0.50 packs per day. She has never used smokeless tobacco. She reports that she does not drink alcohol and does not use drugs.  CODE STATUS: Full code  ADVANCE DIRECTIVES: Primary Decision Maker: [name] (brother)    SYMPTOM MANAGEMENT:per attending   Palliative Prophylaxis:   Aspiration, Bowel Regimen, Delirium Protocol, Eye Care, Frequent Pain Assessment, Oral Care and Turn Reposition  PSYCHO-SOCIAL/SPIRITUAL:  Support System: Family  Desire for further Chaplaincy support: No  Additional Recommendations (Limitations, Scope,  Preferences):  continue to treat the treatable  Education on hospice/palliative    PAST MEDICAL HISTORY: Past Medical History:  Diagnosis Date  . Abdominal pain, epigastric   . Acid reflux    Pt was born with acute acid  . AKI (acute kidney injury) ([place]) 09/15/2020  . Anxiety   . GERD (gastroesophageal reflux disease)   . Heart murmur   . Hiatal hernia   . Hypertension   . Hyponatremia 02/10/2020  . Schatzki's ring   . Ulcer    8 years ago    ALLERGIES:  is allergic to iodine, thiazide-type diuretics, and shellfish allergy.   MEDICATIONS:  Current Facility-Administered Medications  Medication Dose Route Frequency Provider Last Rate Last Admin  . acetaminophen (TYLENOL) suppository 650 mg  650 mg Rectal Q4H PRN Candee Furbish, MD   650 mg at 11/27/20 1744  . Chlorhexidine Gluconate Cloth 2 % PADS 6 each  6 each Topical Daily Deland Pretty, MD   6 each at 11/28/20 1117  . dexmedetomidine (PRECEDEX) 400 MCG/100ML (4 mcg/mL) infusion  0.4-1.2 mcg/kg/hr Intravenous Titrated Cecilie Lowers T, MD 6.18 mL/hr at 11/28/20 1000 0.6 mcg/kg/hr at 11/28/20 1000  . docusate sodium (COLACE) capsule 100 mg  100 mg Oral BID PRN Deland Pretty, MD      . heparin injection 5,000 Units  5,000 Units Subcutaneous Q8H Deland Pretty, MD   5,000 Units at 11/28/20 [phone]  . lactated ringers infusion   Intravenous Continuous Deland Pretty, MD   Paused at 11/28/20 [phone]  . levETIRAcetam (KEPPRA) IVPB 1000 mg/100 mL premix  1,000 mg Intravenous Q12H Kirby-Graham, Karsten Fells, NP      . LORazepam (ATIVAN) injection 2 mg  2 mg Intravenous Q4H PRN Candee Furbish, MD   2 mg at 11/28/20 1114  . ondansetron (ZOFRAN) injection 4 mg  4 mg Intravenous Q6H PRN Deland Pretty, MD      . pantoprazole (PROTONIX) injection 40 mg  40 mg Intravenous QHS Deland Pretty, MD   40 mg at 11/27/20 2128  . piperacillin-tazobactam (ZOSYN) IVPB 3.375 g  3.375 g Intravenous Q12H Cecilie Lowers T, MD 12.5 mL/hr at 11/28/20 1019 3.375 g at 11/28/20 1019  . polyethylene glycol (MIRALAX / GLYCOLAX) packet 17 g  17 g Oral Daily PRN Deland Pretty, MD      . potassium chloride 10 mEq in 50 mL *CENTRAL LINE* IVPB  10 mEq Intravenous Q1 Hr x 4 Candee Furbish, MD      . thiamine 500mg  in normal saline (86ml) IVPB  500 mg Intravenous Q8H Candee Furbish, MD       Followed by  . [START ON December 21, 2020] thiamine (B-1) injection 100 mg  100 mg Intravenous Q24H Candee Furbish, MD        VITAL SIGNS: BP (!) 119/52   Pulse 61   Temp 99.3 F (37.4 C) (Axillary)   Resp 17   Ht 5\' 2"  (1.575 m)   Wt 41.2 kg   SpO2 95%   BMI 16.61 kg/m  Filed Weights   11/29/2020 1700 11/27/20 0449  Weight: 40 kg 41.2 kg    Estimated body mass index is 16.61 kg/m as calculated from the following:   Height as of this encounter: 5\' 2"  (1.575 m).   Weight as of this encounter: 41.2 kg.  LABS: CBC:    Component Value Date/Time   WBC 17.6 (H) 11/28/2020 0055   HGB 8.0 (L) 11/28/2020 0055   HCT 25.0 (L) 11/28/2020 0055   PLT 376 11/28/2020 0055   Comprehensive Metabolic Panel:    Component Value Date/Time   NA 140 11/28/2020 0055   NA 131 (A) 09/29/2020 0000   K 3.6 11/28/2020 0055   BUN 38 (H) 11/28/2020 0055   BUN 10 09/29/2020 0000   CREATININE 1.94 (H) 11/28/2020 0055   ALBUMIN 3.7 12/05/2020 1636     Review of Systems  Unable to perform ROS: Acuity of condition   The above conversation was completed via telephone due to the visitor restrictions during the COVID-19 pandemic. Thorough chart review and discussion with necessary members of the care team was completed as part of assessment. All issues were discussed and addressed but no physical exam was performed.   Prognosis: Guarded-Poor   Discharge Planning:  To Be Determined  Recommendations: . Full Code-as confirmed by brother . Continue with current plan of care, treat the treatable . Patient seen by palliative in  Oct. 2021 and expressed wishes for DNR, no artificial feeding, and SNF short term only. Discussed at length with brother her wishes. He is torn sharing that is the opposite of what she expressed to him in the past. Does acknowledge her decline, inability to provide care for herself, and poor quality of life.  . [name] remains hopeful patient  will improve and be able to express her wishes providing him some guidance in decisions. Education provided on patient's condition and chances of her not being able to improve encouraging him to think on what decisions he would make on behalf of [name] in this event she is not able.  . Education provided on comfort/hospice as requested by brother.  Marland Kitchen PMT will continue to support and follow. Please call team line with urgent needs.   Palliative Performance Scale: PPS 20-30%              Brother expressed understanding and was in agreement with this plan.   Thank you for allowing the Palliative Medicine Team to assist in the care of this patient.  Time In: 1130 Time Out: 1235 Time Total: 65 min.   Visit consisted of counseling and education dealing with the complex and emotionally intense issues of symptom management and palliative care in the setting of serious and potentially life-threatening illness.Greater than 50%  of this time was spent counseling and coordinating care related to the above assessment and plan.  Signed by:  Alda Lea, AGPCNP-BC Palliative Medicine Team  Phone: [phone] Pager: [phone] Amion: Bjorn Pippin

## 2020-11-28 NOTE — Progress Notes (Signed)
LTM maintenance completed; reprepped and reglued Ref, C3, C4, P4, P7, P8, Pz, O1, O2, T8, F7, F8, F4, and checked under Fp1 and Fp2. No skin breakdown was seen.

## 2020-11-28 NOTE — Progress Notes (Signed)
[place] Progress Note Patient Name: Janiylah Hannis DOB: 1951-05-26 MRN: [id number]   Date of Service  11/28/2020  HPI/Events of Note  Asking for precedex gtt to max ceiling change for agitation.   eICU Interventions  AM labs reviewed.  Precedex gtt max ceiling titration up to 1.2 for now.      Intervention Category Intermediate Interventions: Other:  Elmer Sow 11/28/2020, 5:44 AM

## 2020-11-28 NOTE — Progress Notes (Signed)
NAME:  [name], MRN:  [id number], DOB:  1951/03/28, LOS: 2 ADMISSION DATE:  2020/12/24,   CHIEF COMPLAINT: Altered mental status  Brief History:  70 year old female who presented with altered mental status from home  History of Present Illness:  This is a 70 year old white female that presented to the emergency room via EMS from home.  Patient's brother had not been in contact with her for approximately 24 hours and called police to do a wellness check.  She was found minimally responsive on the floor.  No history is available about events that led up to finding her on the floor.  She is unable to provide any history.  She was discharged from the hospital on 24 October 2020 following admission for acute metabolic encephalopathy and community-acquired pneumonia.  Past Medical History:  Community-acquired pneumonia 78/29 Metabolic encephalopathy 56/21 Sacrum and coccyx fracture 12/21 Hypertension GERD/peptic ulcer disease Syncope Chronic hyponatremia   Significant Diagnostic Tests:  CT head: IMPRESSION: No acute intracranial pathology  CT scan cervical spine: IMPRESSION: 1. Small deformity of indeterminate age involving an anterior osteophyte along the inferior endplate of the C3 vertebral body. MRI correlation is recommended. 2. Marked severity degenerative changes at the levels of C4-C5 and C5-C6. 3. Approximately 1 mm to 2 mm retrolisthesis of the C3 on C4 vertebral body.  CT abdomen: IMPRESSION: 1. Large sliding-type hiatal hernia. 2. Hepatic steatosis. 3. Wall thickening and inflammatory changes are seen involving most of the colon, most consistent with infectious or inflammatory colitis. 4. There are noted several small bowel loops in the left lower quadrant which demonstrate wall thickening suggesting enteritis.  Aortic Atherosclerosis (ICD10-I70.0).   Micro Data:  Influenza: Negative COVID: Negative  Antimicrobials:   Zosyn 1/16 >>  Objective    Blood pressure 129/68, pulse 66, temperature 98.9 F (37.2 C), temperature source Axillary, resp. rate 14, height 5\' 2"  (1.575 m), weight 41.2 kg, SpO2 98 %.        Intake/Output Summary (Last 24 hours) at 11/28/2020 0944 Last data filed at 11/28/2020 0329 Gross per 24 hour  Intake 1600.47 ml  Output 650 ml  Net 950.47 ml   Filed Weights   12-24-2020 1700 11/27/20 0449  Weight: 40 kg 41.2 kg    Examination: Constitutional: ill appearing woman writhing around in bed  Eyes: pupils equal, not tracking, not reacting to threat Ears, nose, mouth, and throat: MMM, c collar in place Cardiovascular: RRR, ext warm, +SEM Respiratory: Scattered transmitted upper airway sounds, no accessory muscle use Gastrointestinal: Soft, +BS Skin: No rashes, normal turgor Neurologic: moves all 4 ext, not following commands Psychiatric: RASS -1   CK stable AKI stable No CXR Thrombocytosis better  Assessment & Plan:  -Shock- hypovolemic vs. Septic.  Resolved. 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 Leonel Ramsay from neurology to see and will likely start AEDs, appreciate help - Continue LTVEEG - Will clear c spine clinically if can get more awake - guarded prognosis, 10 admissions in past year, saw palliative in Oct 2021 with patient wanting to be DNR but brother  seems to have reversed this.  She does not want feeding tube or long term SNF it appears.  Discussed her case with her brother, he wants her to make this decision so we  will work toward getting her to point where she can voice her wishes (if possible).  Best practice (evaluated daily)  Diet: cortrak>>TF Pain/Anxiety/Delirium protocol (if indicated): precedex VAP protocol (if indicated): N/A DVT prophylaxis: Heparin subcu GI prophylaxis: Protonix Glucose control: Monitor blood sugar Mobility: Bedrest Disposition: ICU pending precedex liberation and assurances can protect airway  Goals of Care:  Discussed with brother Jenisis Harmsen [phone] over phone 1/16, full code until patient can make decisions for herself.  May need to re-address if cannot get her encephalopathy improved.  Patient critically ill due to acute metabolic encephalopathy Interventions to address this today EEG, AEDs, precedex wean Risk of deterioration without these interventions is high  I personally spent 38 minutes providing critical care not including any separately billable procedures  Erskine Emery MD [facility] 11/28/2020 9:44 AM Personal pager: [phone] If unanswered, please page CCM On-call: [phone]

## 2020-11-28 NOTE — Progress Notes (Signed)
[place] Progress Note Patient Name: [name] DOB: 1951/01/17 MRN: [id number]   Date of Service  11/28/2020  HPI/Events of Note  Neurology called , to use precedex for agitation, even for seizures. EEG: no status. Full report will be given in 24 hrs.  Discussed with bed side. Received ativan earlier.   eICU Interventions  Precedex gtt ordered as per Dr Curly Shores, Neurology . Consider her consultation in AM if not better.      Intervention Category Major Interventions: Delirium, psychosis, severe agitation - evaluation and management  Elmer Sow 11/28/2020, 4:16 AM

## 2020-11-28 NOTE — Procedures (Addendum)
Patient Name: [name]  MRN: [id number]  Epilepsy Attending: Lora Havens  Referring Physician/Provider: Dr Ina Homes Duration: 11/27/2020 1924 to 11/28/2020 1924  Patient history: 70yo F with AMS. EEG to evaluate for seizure.  Level of alertness:  lethargic  AEDs during EEG study: None  Technical aspects: This EEG study was done with scalp electrodes positioned according to the 10-20 International system of electrode placement. Electrical activity was acquired at a sampling rate of 500Hz  and reviewed with a high frequency filter of 70Hz  and a low frequency filter of 1Hz . EEG data were recorded continuously and digitally stored.   Description: EEG showed continuous generalized 3 to 6 Hz theta-delta slowing. Generalized periodic epileptiform discharges were noted every 2-3 seconds. They appear more sharply contoured and in runs when patient is awake/stimulated lasting 3-5 seconds consistent with brief ictal-interictal rhythmic discharges.    ABNORMALITY -Continuous slow, generalized -Periodic epileptiform discharges, generalized -Brief ictal-interictal rhythmic discharges, generalized  IMPRESSION: This study showed evidence of  generalized  epileptogenicity. Brief ictal-interictal rhythmic discharges were also noted which have high potential for seizures.  There is also severe diffuse encephalopathy, non specific etiology. No definite seizures were seen throughout the recording.  [name]

## 2020-11-29 ENCOUNTER — Inpatient Hospital Stay (HOSPITAL_COMMUNITY): Payer: Medicare Other

## 2020-11-29 DIAGNOSIS — J90 Pleural effusion, not elsewhere classified: Secondary | ICD-10-CM

## 2020-11-29 DIAGNOSIS — Z7189 Other specified counseling: Secondary | ICD-10-CM | POA: Diagnosis not present

## 2020-11-29 DIAGNOSIS — R4182 Altered mental status, unspecified: Secondary | ICD-10-CM | POA: Diagnosis not present

## 2020-11-29 DIAGNOSIS — Z66 Do not resuscitate: Secondary | ICD-10-CM

## 2020-11-29 DIAGNOSIS — G9341 Metabolic encephalopathy: Secondary | ICD-10-CM | POA: Diagnosis not present

## 2020-11-29 DIAGNOSIS — Z515 Encounter for palliative care: Secondary | ICD-10-CM | POA: Diagnosis not present

## 2020-11-29 DIAGNOSIS — R569 Unspecified convulsions: Secondary | ICD-10-CM

## 2020-11-29 LAB — BASIC METABOLIC PANEL
Anion gap: 12 (ref 5–15)
Anion gap: 11 (ref 5–15)
BUN: 30 mg/dL — ABNORMAL HIGH (ref 8–23)
BUN: 27 mg/dL — ABNORMAL HIGH (ref 8–23)
CO2: 21 mmol/L — ABNORMAL LOW (ref 22–32)
CO2: 21 mmol/L — ABNORMAL LOW (ref 22–32)
Calcium: 8 mg/dL — ABNORMAL LOW (ref 8.9–10.3)
Calcium: 7.9 mg/dL — ABNORMAL LOW (ref 8.9–10.3)
Chloride: 112 mmol/L — ABNORMAL HIGH (ref 98–111)
Chloride: 111 mmol/L (ref 98–111)
Creatinine, Ser: 1.28 mg/dL — ABNORMAL HIGH (ref 0.44–1.00)
Creatinine, Ser: 1.23 mg/dL — ABNORMAL HIGH (ref 0.44–1.00)
GFR, Estimated: 46 mL/min — ABNORMAL LOW (ref >60)
GFR, Estimated: 48 mL/min — ABNORMAL LOW (ref >60)
Glucose, Bld: 83 mg/dL (ref 70–99)
Glucose, Bld: 90 mg/dL (ref 70–99)
Potassium: 3.1 mmol/L — ABNORMAL LOW (ref 3.5–5.1)
Potassium: 3.8 mmol/L (ref 3.5–5.1)
Sodium: 145 mmol/L (ref 135–145)
Sodium: 143 mmol/L (ref 135–145)

## 2020-11-29 LAB — CBC
HCT: 27.6 % — ABNORMAL LOW (ref 36.0–46.0)
Hemoglobin: 8.3 g/dL — ABNORMAL LOW (ref 12.0–15.0)
MCH: 22.1 pg — ABNORMAL LOW (ref 26.0–34.0)
MCHC: 30.1 g/dL (ref 30.0–36.0)
MCV: 73.4 fL — ABNORMAL LOW (ref 80.0–100.0)
Platelets: 363 10*3/uL (ref 150–400)
RBC: 3.76 MIL/uL — ABNORMAL LOW (ref 3.87–5.11)
RDW: 16.8 % — ABNORMAL HIGH (ref 11.5–15.5)
WBC: 18.5 10*3/uL — ABNORMAL HIGH (ref 4.0–10.5)
nRBC: 0 % (ref 0.0–0.2)

## 2020-11-29 LAB — MAGNESIUM
Magnesium: 2.5 mg/dL — ABNORMAL HIGH (ref 1.7–2.4)
Magnesium: 2.1 mg/dL (ref 1.7–2.4)
Magnesium: 2.1 mg/dL (ref 1.7–2.4)

## 2020-11-29 LAB — PHOSPHORUS
Phosphorus: 2.8 mg/dL (ref 2.5–4.6)
Phosphorus: 2.2 mg/dL — ABNORMAL LOW (ref 2.5–4.6)
Phosphorus: 2.3 mg/dL — ABNORMAL LOW (ref 2.5–4.6)

## 2020-11-29 LAB — GLUCOSE, CAPILLARY
Glucose-Capillary: 72 mg/dL (ref 70–99)
Glucose-Capillary: 81 mg/dL (ref 70–99)

## 2020-11-29 MED ORDER — POTASSIUM CHLORIDE 10 MEQ/50ML IV SOLN: 10.0000 meq | INTRAVENOUS | Status: DC

## 2020-11-29 MED ORDER — FUROSEMIDE 10 MG/ML IJ SOLN
20.0000 mg | Freq: Once | INTRAMUSCULAR | Status: AC
  Administered 2020-11-29: 20 mg via INTRAVENOUS
  Filled 2020-11-29: qty 2

## 2020-11-29 MED ORDER — POTASSIUM CHLORIDE 10 MEQ/100ML IV SOLN
10.0000 meq | INTRAVENOUS | Status: AC
  Administered 2020-11-29 (×3): 10 meq via INTRAVENOUS
  Filled 2020-11-29 (×3): qty 100

## 2020-11-29 MED ORDER — POTASSIUM CHLORIDE 10 MEQ/100ML IV SOLN
10.0000 meq | INTRAVENOUS | Status: AC
  Administered 2020-11-29 (×6): 10 meq via INTRAVENOUS
  Filled 2020-11-29 (×4): qty 100

## 2020-11-29 MED ORDER — PHENOBARBITAL SODIUM 65 MG/ML IJ SOLN
65.0000 mg | Freq: Once | INTRAMUSCULAR | Status: AC
  Administered 2020-11-29: 65 mg via INTRAVENOUS
  Filled 2020-11-29: qty 1

## 2020-11-29 MED ORDER — LORAZEPAM 2 MG/ML IJ SOLN: 1.0000 mg | INTRAMUSCULAR | Status: DC | PRN

## 2020-11-29 MED ORDER — PROSOURCE TF PO LIQD: 45.0000 mL | Freq: Every day | ORAL | Status: DC

## 2020-11-29 MED ORDER — OSMOLITE 1.2 CAL PO LIQD
1000.0000 mL | ORAL | Status: DC
  Filled 2020-11-29 (×2): qty 1000

## 2020-11-29 MED ORDER — ORAL CARE MOUTH RINSE
15.0000 mL | Freq: Two times a day (BID) | OROMUCOSAL | Status: DC
  Administered 2020-11-29 – 2020-11-30 (×3): 15 mL via OROMUCOSAL

## 2020-11-29 MED ORDER — DEXTROSE IN LACTATED RINGERS 5 % IV SOLN: INTRAVENOUS | Status: DC

## 2020-11-29 MED ORDER — DEXTROSE-NACL 5-0.9 % IV SOLN: INTRAVENOUS | Status: DC

## 2020-11-29 NOTE — Progress Notes (Signed)
[place] Progress Note Patient Name: [name] DOB: [birth year] MRN: [id number]   Date of Service  11/29/2020  HPI/Events of Note  Patient is NPO and needs an order for Dextrose containing maintenance iv fluids.  eICU Interventions  D5 % LR ordered @ 75 ml / hour.        Frederik Pear 11/29/2020, 9:36 PM

## 2020-11-29 NOTE — Progress Notes (Signed)
[facility] ADULT ICU REPLACEMENT PROTOCOL   The patient does apply for the [facility] Adult ICU Electrolyte Replacment Protocol based on the criteria listed below:   1. Is GFR >/= 30 ml/min? Yes.    Patient's GFR today is 46 2. Is SCr </= 2? Yes.   Patient's SCr is 1.28 ml/kg/hr 3. Did SCr increase >/= 0.5 in 24 hours? No. 4. Abnormal electrolyte(s): K+3.1 5. Ordered repletion with: protocol 6. If a panic level lab has been reported, has the CCM MD in charge been notified? Yes.  .   Physician:  Dr. Nicki Guadalajara, [place] Grumbling 11/29/2020 3:09 AM

## 2020-11-29 NOTE — Progress Notes (Signed)
Cortrak Tube Team Note:  Consult received to place a Cortrak feeding tube.   RD made multiple attempts to advance tube past GE junction, unsuccessful. Patient has history of large hiatal hernia, suspect this may be contributing factor as she has had multiple unsuccessful NG placements in the past. Diagnostic radiology contacted for placement.   RD available to bridle once small bore placed.   Mariana Single RD, LDN Clinical Nutrition Pager listed in [place]

## 2020-11-29 NOTE — Progress Notes (Signed)
Pharmacy Antibiotic Note  [name] is a 70 y.o. female admitted on 12-Dec-2020 with sepsis.  Pharmacy has been consulted for Zosyn dosing for infectious colitis.  Height: 5\' 2"  (157.5 cm) Weight: 41.8 kg (92 lb 2.4 oz) IBW/kg (Calculated) : 50.1  Temp (24hrs), Avg:97.9 F (36.6 C), Min:97.6 F (36.4 C), Max:98.3 F (36.8 C)  Recent Labs  Lab 2020/12/12 1636 12/12/2020 1836 11/27/20 0345 11/28/20 0055 11/29/20 0118 11/29/20 1325  WBC 27.3*  --  21.0* 17.6* 18.5*  --   CREATININE 2.36*  --  2.28* 1.94* 1.28* 1.23*  LATICACIDVEN >11.0* 9.3*  --   --   --   --     Estimated Creatinine Clearance: 29.3 mL/min (A) (by C-G formula based on SCr of 1.23 mg/dL (H)).    Allergies  Allergen Reactions  . Iodine Shortness Of Breath and Rash  . Thiazide-Type Diuretics Other (See Comments)    Severe hyponatremia July 2021  . Shellfish Allergy     Causes rash    Antimicrobials this admission: 1/15 Cefepime >> 1/16 1/15 Vancomycin >> 1/16 1/16 Zosyn >>  Dose adjustments this admission: N/a  Microbiology results:  1/15 C.diff - neg 1/15 UCx > neg 1/15 BCx x 2: ngtd  Plan:  - Continue Zosyn 3.375g IV q 8 hrs. Planning to stop at 7d of treatment (1/21).  Thank you for allowing pharmacy to be a part of this patient's care.  [place] Crane, Roylene Reason, BCCP Clinical Pharmacist  11/29/2020 3:02 PM   [facility] pharmacy phone numbers are listed on [email or web address]

## 2020-11-29 NOTE — Progress Notes (Signed)
[place] Progress Note Patient Name: [name] DOB: [birth year] MRN: [id number]   Date of Service  11/29/2020  HPI/Events of Note  CXR with increasing right sided infiltrate Already on piperacillin tazobactam Blood cultures NGTD Negative MRSA screen  eICU Interventions  Patient appears more comfortable Ordered sputum culture     Intervention Category Intermediate Interventions: Diagnostic test evaluation  Judd Lien 11/29/2020, 3:25 AM

## 2020-11-29 NOTE — Progress Notes (Addendum)
Subjective: Has had seizures overnight   ROS: Unable to obtain due to poor mental status  Examination  Vital signs in last 24 hours: Temp:  [97 F (36.1 C)-98.3 F (36.8 C)] 97.6 F (36.4 C) (01/18 1200) Pulse Rate:  [48-131] 65 (01/18 1400) Resp:  [13-35] 20 (01/18 1400) BP: (85-186)/(45-104) 109/70 (01/18 1400) SpO2:  [92 %-100 %] 96 % (01/18 1400) Weight:  [41.8 kg] 41.8 kg (01/18 0600)  General: lying in bed, cachetic looking CVS: pulse-normal rate and rhythm RS: coarse breath sounds BL Extremities: warm  Neuro: comatose, doesn't open eyes to noxious stimuli, doesn't follow commands, PERLA< corneal reflex absent, moaning with noxious stimulation with some withdrawal in RUE and LLE. Alos had intermittent rhythmic twitching of all extremities  Basic Metabolic Panel: Recent Labs  Lab 11/19/2020 1636 12/01/2020 1640 11/27/20 0345 11/27/20 0851 11/28/20 0055 11/29/20 0118 11/29/20 1325  NA 132*   < > 135 132* 140 145 143  K 4.7   < > 4.4 3.7 3.6 3.1* 3.8  CL 93*  --  101  --  106 112* 111  CO2 10*  --  16*  --  20* 21* 21*  GLUCOSE 202*  --  150*  --  94 83 90  BUN 32*  --  37*  --  38* 30* 27*  CREATININE 2.36*  --  2.28*  --  1.94* 1.28* 1.23*  CALCIUM 9.0  --  8.2*  --  8.1* 8.0* 7.9*  MG  --   --  1.8  --  2.9* 2.5* 2.1  PHOS  --   --  6.0*  --  4.5 2.8 2.2*   < > = values in this interval not displayed.    CBC: Recent Labs  Lab 11/21/2020 1636 11/22/2020 1640 11/27/20 0345 11/27/20 0851 11/28/20 0055 11/29/20 0118  WBC 27.3*  --  21.0*  --  17.6* 18.5*  NEUTROABS 24.0*  --   --   --   --   --   HGB 9.4* 12.2 9.0* 10.2* 8.0* 8.3*  HCT 32.9* 36.0 27.9* 30.0* 25.0* 27.6*  MCV 78.7*  --  71.5*  --  72.0* 73.4*  PLT 609*  --  483*  --  376 363     Coagulation Studies: Recent Labs    12/08/2020 1636  LABPROT 14.4  INR 1.2    Imaging MR brain wo contrast 11/27/2020: No acute intracranial abnormality. Stable noncontrast MRI appearance of the brain from last  month. Moderate for age nonspecific cerebral white matter signal changes,most commonly due to chronic small vessel disease. Possible tiny chronic infarct in the left cerebellum.  ASSESSMENT AND PLAN: 70 yo female who apparently was living at home prior to several hospital admissions in Dec 2021. On this admission, she was found down by police and diagnosed with septic shock vs hypovolemic shock, lactic acidosis, rhabdomyolysis, metabolic derangements, and colitis.   New onset status epilepticus Acute encephalopathy, multifactorial - LTM eeg with seizures overnight - Encephalopathy due to seizure, medications, shock  Recommendations - will add phenobarb 65mg  once, can add another dose at bedtime if needed - Continue keppra and vpa current dose - continue LTM eeg while adjusting aeds - Patient is DNR. Discussed with ICU DR Tamala Julian that next step would be burst suppression. He spoke with patient's family. Plan is to attempt to control seizures overnight without intubation, and if they persist, family will consider transitioning to comfort care tomorrow - seizure precautions - prn iv ativan 1-2 mg  for clinical sz> , use cautiously to avoid worsening respiratory depression  CRITICAL CARE Performed by: Lora Havens   Total critical care time: 35 minutes  Critical care time was exclusive of separately billable procedures and treating other patients.  Critical care was necessary to treat or prevent imminent or life-threatening deterioration.  Critical care was time spent personally by me on the following activities: development of treatment plan with patient and/or surrogate as well as nursing, discussions with consultants, evaluation of patient's response to treatment, examination of patient, obtaining history from patient or surrogate, ordering and performing treatments and interventions, ordering and review of laboratory studies, ordering and review of radiographic studies, pulse oximetry and  re-evaluation of patient's condition.    Zeb Comfort Epilepsy [facility] For questions after 5pm please refer to AMION to reach the Neurologist on call

## 2020-11-29 NOTE — Progress Notes (Signed)
eLink Physician-Brief Progress Note Patient Name: [name] DOB: 12/21/1950 MRN: [id number]   Date of Service  11/29/2020  HPI/Events of Note  Notified of increased work of breathing Precedex 0.5 placed on hold due to hypotension and HR 60s Given Ativan with some improvement  eICU Interventions  May resume Precedex at a lower dose CXR stat Plan to give low dose morphine if still with increased work of breathing     Intervention Category Major Interventions: Hypotension - evaluation and management Minor Interventions: Agitation / anxiety - evaluation and management  Judd Lien 11/29/2020, 1:17 AM

## 2020-11-29 NOTE — Progress Notes (Signed)
Daily Progress Note   Patient Name: [name] [facility]       Date: 11/29/2020 DOB: Jun 27, 1951  Age: 70 y.o. MRN#: [id number] Attending Physician: Candee Furbish, MD Primary Care Physician: Nicholes Rough, PA-C Admit Date: 12/09/2020  Reason for Consultation/Follow-up: Establishing goals of care  Chart Reviewed and Updates Received.   Patient remains critically ill. Continued to have seizure activity overnight, episode of increased work of breathing, and hypotension.   I spoke with patient's brother at length providing updates. We discussed patient's poor quality of life, critical state, and poor prognosis. Mr. Herrada verbalized understanding expressing his hopes for patient to improve but is realizing this may not occur. Support provided.   He expressed concerns regarding her health prior to this admission and now her ability to show recovery. He shares he is going for a radiation consultation today and is worried about himself and his sister.   Mr. Costello understands given his sister's critical state he will need to make decisions for her. He verbalizes his heartbreak but also expresses wishes for her not to suffer.   Mr. Davlin remains hopeful for some improvement but is also preparing for the worst.   I created space and opportunity to discuss patient's full code status, previous expressed wishes by patient for DNR, and what heroic measures would look like for patient in such a frail state. Mr. Stipe verbalized understanding and appreciation. He is requesting DNR/DNI with continued treatment to allow patient an opportunity to improve. He reports if no improvement over the next 24-48hrs he will then be open to transitioning care to a more comfort focused or if patient further declines. He understands given patient's critical state she is at high risk of further deterioration and sudden death. Mr. Bumgardner verbalized understanding requesting updates with any significant changes.   He again confirms  DNR/DNI.   Support provided and all questions answered.    Length of Stay: 3 days  Vital Signs: BP 123/61   Pulse 69   Temp 98.3 F (36.8 C) (Oral)   Resp 18   Ht 5\' 2"  (1.575 m)   Wt 41.8 kg   SpO2 99%   BMI 16.85 kg/m  SpO2: SpO2: 99 % O2 Device: O2 Device: Nasal Cannula O2 Flow Rate: O2 Flow Rate (L/min): 6 L/min   Palliative Care Assessment & Plan  HPI: Palliative Care consult requested for goals of care discussion in this 70 y.o. female with multiple medical problems including hypertension, GERD, hyponatremia, pneumonia, and encephalopathy during previous hospital admission (10/2020). Patient presented to the ER via EMS from home after brother was concerned and requested a well check visit given no contact with patient for over 24hrs. Patient was found on bathroom floor minimally responsive. Since admission she has been treated for shock. Continues to have persistent encephalopathy. Patient is severely malnourished as observed on admission.   Code Status:  DNR  Goals of Care/Recommendations:  DNR/DNI as requested and confirmed by brother, Clydie Dillen.   Continue to treat the treatable. Watchful waiting over next 24-48 hours as requested by brother.   Mr. Demeyer remains somewhat hopeful for improvement but is also preparing for the worst (further decline, no improvement, EOL). If no improvement of further decline he will be open to focus on patient's comfort/EOL.   PMT will continue to support and follow as needed.   Prognosis: POOR   Discharge Planning: To Be Determined  Thank you for allowing the Palliative Medicine Team to assist in the care  of this patient.  Time Total: 40 min.   Visit consisted of counseling and education dealing with the complex and emotionally intense issues of symptom management and palliative care in the setting of serious and potentially life-threatening illness.Greater than 50%  of this time was spent counseling and coordinating care related  to the above assessment and plan.  Alda Lea, AGPCNP-BC  Palliative Medicine Team [phone]

## 2020-11-29 NOTE — Procedures (Incomplete)
Patient Name:[name] EPP:[id number] Epilepsy Attending:Yenty Bloch Barbra Sarks Referring Physician/Provider:Dr Ina Homes Duration:11/28/2020 1924 to 11/29/2020 1924  Patient [id number] F with AMS. EEG to evaluate for seizure.  Level of alertness:lethargic  AEDs during EEG study: LEV, VPA, PB  Technical aspects: This EEG study was done with scalp electrodes positioned according to the 10-20 International system of electrode placement. Electrical activity was acquired at a sampling rate of 500Hz  and reviewed with a high frequency filter of 70Hz  and a low frequency filter of 1Hz . EEG data were recorded continuously and digitally stored.   Description: EEG initially showed generalized periodic epileptiform discharges every 2-3 seconds. After 1936, EEG showed 5 Hz spike and wave which gradually evolved into 2 to 3 Hz delta slowing admixed with sharp waves. On video, patient was noted to be laying in bed without any definite clinical signs.  The EEG after that continued to show waxing and waning pattern of 5 to 6 Hz spike and wave lasting few seconds followed by 2 to 3 Hz spike and wave lasting few seconds followed by 2 to 3 seconds of EEG attenuation.  Patient was given IV Ativan 2 mg at 2027 after which the frequency of sharp waves improved and EEG showed generalized periodic epileptiform discharges at 0.5 to 1 Hz.  Again at 2152, EEG started showing waxing and waning pattern of 5 to 6 Hz spike and wave followed by 2 to 3 Hz spike and wave followed by EEG attenuation every few seconds.  Event button was pressed on 11/29/2020 at 0138.  Patient was laying in bed, curled up and noted to have rhythmic movements with all 4 extremities being adducted and flexed followed by relaxation.  Concomitant EEG showed sharply contoured 4 to 5 Hz theta activity admixed with spikes without definite evolution. Patient received the scheduled maintenance dose of Keppra at 0143 after which the clinical  activity stopped.  EEG also showed generalized bursts of sharply contoured 4 to 5 Hz theta slowing lasting about 1 second followed by EEG attenuation lasting 2 to 3 seconds.  This clinical pattern along with EEG changes are concerning for seizures.  ABNORMALITY seizure, generalized -Seizure without clinical signs, generalized -Periodic epileptiform discharges, generalized -Continuousslow, generalized  IMPRESSION: This study showed seizure with generalized onset at 0138 on 11/29/2020 which improved after patient was given maintenance dose of Keppra at 0 143.  Patient was also noted to have seizures without clinical signs after 1936 on 11/28/2020 which improved briefly after IV Ativan. There is also severe diffuse encephalopathy, non specific etiology but most likely secondary to seizures.  EEG appears to be worsening compared to previous day.  Vence Lalor Barbra Sarks

## 2020-11-29 NOTE — Progress Notes (Signed)
Spoke to husband on phone as patient is now on a third AED to try to suppress seizures.  We discussed that usually the next step would be burst-suppression and mechanical ventilation.  Discussed that even with this intervention there is good chance she will not wake up meaningfully and based on her recent decline, failure to thrive, and previously stated wishes regarding advanced therapies, allowing her to pass in peace may be path forward.  He needs tonight to think about, he confirms DNR status and will call in AM with decision.  Erskine Emery MD PCCM

## 2020-11-29 NOTE — Progress Notes (Signed)
Initial Nutrition Assessment  DOCUMENTATION CODES:   Underweight,Severe malnutrition in context of chronic illness  INTERVENTION:   Tube Feeding via Cortrak:  Osmolite 1.2 at 50 ml/hr Begin at 20 ml/hr; titrate by 10 mL q 8 hours until goal rate of 50 ml/hr Pro-Source TF 45 mL daily Provides 1480 kcals, 78 g of protein and 972 mL of free water  Monitor magnesium, potassium, and phosphorus daily for at least 3 days, MD to replete as needed, as pt is at risk for refeeding syndrome given chronic malnutrition   NUTRITION DIAGNOSIS:   Severe Malnutrition related to chronic illness as evidenced by severe fat depletion,severe muscle depletion.  GOAL:   Patient will meet greater than or equal to 90% of their needs   MONITOR:   TF tolerance,Diet advancement,Labs,Weight trends  REASON FOR ASSESSMENT:   Malnutrition Screening Tool    ASSESSMENT:   70 yo female admitted after being found on floor, minimally responsive and admitted with severe lactic acidosis, septic shock, AKI, encephalopathy.  Pt with 3 hospitalizations in 1 month including  admission 12/21 post fall with sacrum and coccyx fracture. PMH includes SIADH, HTN, anxiety, GERD   Pt with 3 admissions in 1 month, 10 admissions in past year. Noted pt would not want long term feeding tube as per previous meetings with palliative care  CT abdomen with enteritis/colitis, large hiatal hernia, hepatic steatosis CT head negative  NPO, noted order for Cortrak today. Per chart review, pt started on TPN in October 2021 admission at [facility] as unable to place enteral access successfully due to large hiatal hernia. Plan for TF if able to obtain enteral access this admission  Pt is underweight but no recent wt loss per weight encounters. Unable to obtain diet and weight history from patient at this time  Pt has met clinical characteristics for malnutrition on previous admission. Given this and current information, pt continues to meet  clinical characteristics for malnutrition  On admission on October 2021, brother reporting pt with significant weight loss over the past year. Pt with episodes of dry heaving and vomiting. Pt with diarrhea this admission, like related to colitis/enteritis as seeon on CT  Labs: potassium 3.1 (L), Creatinine 1.28, BUN 30 Meds: thiamine   NUTRITION - FOCUSED PHYSICAL EXAM:  Flowsheet Row Most Recent Value  Orbital Region Unable to assess  Upper Arm Region Severe depletion  Thoracic and Lumbar Region Unable to assess  Buccal Region Unable to assess  Temple Region Unable to assess  Clavicle Bone Region Severe depletion  Clavicle and Acromion Bone Region Severe depletion  Scapular Bone Region Unable to assess  Dorsal Hand Severe depletion  Patellar Region Severe depletion  Anterior Thigh Region Severe depletion  Posterior Calf Region Severe depletion  Edema (RD Assessment) Moderate  [facial swelling]  Hair Reviewed  Eyes Unable to assess  Mouth Reviewed  Skin Reviewed  Nails Reviewed       Diet Order:   Diet Order            Diet NPO time specified  Diet effective now                 EDUCATION NEEDS:   Not appropriate for education at this time  Skin:  Skin Assessment: Reviewed RN Assessment  Last BM:  1/16  Height:   Ht Readings from Last 1 Encounters:  12/05/2020 '5\' 2"'  (1.575 m)    Weight:   Wt Readings from Last 1 Encounters:  11/29/20 41.8 kg  BMI:  Body mass index is 16.85 kg/m.  Estimated Nutritional Needs:   Kcal:  1500-1750 kcals  Protein:  75-85 g  Fluid:  >/= 1.5 L   Kerman Passey MS, RDN, LDN, CNSC Registered Dietitian III Clinical Nutrition RD Pager and On-Call Pager Number Located in [place]

## 2020-11-29 NOTE — Progress Notes (Addendum)
NAME:  [name], MRN:  [id number], DOB:  23-Jul-1951, LOS: 3 ADMISSION DATE:  2020-12-08,   CHIEF COMPLAINT: Altered mental status  Brief History:  70 year old female who presented with altered mental status from home  History of Present Illness:  This is a 70 year old white female that presented to the emergency room via EMS from home.  Patient's brother had not been in contact with her for approximately 24 hours and called police to do a wellness check.  She was found minimally responsive on the floor.  No history is available about events that led up to finding her on the floor.  She is unable to provide any history.  She was discharged from the hospital on 24 October 2020 following admission for acute metabolic encephalopathy and community-acquired pneumonia.  Past Medical History:  Community-acquired pneumonia 43/32 Metabolic encephalopathy 95/18 Sacrum and coccyx fracture 12/21 Hypertension GERD/peptic ulcer disease Syncope Chronic hyponatremia   Significant Diagnostic Tests:  CT head: IMPRESSION: No acute intracranial pathology  CT scan cervical spine: IMPRESSION: 1. Small deformity of indeterminate age involving an anterior osteophyte along the inferior endplate of the C3 vertebral body. MRI correlation is recommended. 2. Marked severity degenerative changes at the levels of C4-C5 and C5-C6. 3. Approximately 1 mm to 2 mm retrolisthesis of the C3 on C4 vertebral body.  CT abdomen: IMPRESSION: 1. Large sliding-type hiatal hernia. 2. Hepatic steatosis. 3. Wall thickening and inflammatory changes are seen involving most of the colon, most consistent with infectious or inflammatory colitis. 4. There are noted several small bowel loops in the left lower quadrant which demonstrate wall thickening suggesting enteritis.  Aortic Atherosclerosis (ICD10-I70.0).   Micro Data:  Influenza: Negative COVID: Negative  Antimicrobials:   Zosyn 1/16 >>  Objective    Blood pressure 117/67, pulse 77, temperature 98.3 F (36.8 C), temperature source Oral, resp. rate 20, height 5\' 2"  (1.575 m), weight 41.8 kg, SpO2 97 %.        Intake/Output Summary (Last 24 hours) at 11/29/2020 0957 Last data filed at 11/29/2020 0900 Gross per 24 hour  Intake 2726.07 ml  Output 1500 ml  Net 1226.07 ml   Filed Weights   December 08, 2020 1700 11/27/20 0449 11/29/20 0600  Weight: 40 kg 41.2 kg 41.8 kg    Examination: Constitutional: ill appearing woman non-responsive, otherwise NAD Eyes: pupils equal, 2 mm , not tracking, not reacting to threat, eyes closed Ears, nose, mouth, and throat: MMM, c collar in place Cardiovascular: S1, S2, RRR, ext warm, +SEM Respiratory: Scattered transmitted upper airway sounds, no accessory muscle use, rate is controlled Gastrointestinal: Soft, +BS, NT, ND, Flexiseal Skin: No rashes, No lesions, normal turgor Neurologic: moves all 4 ext, not following commands,PERRLA, 2 mm Psychiatric: RASS -1   CXR  1/18 with worsening consolidation in RLL and RML, increase in right pleural effusion Platelets 363 K of 3.1. Mag 2.5 Creatinine 1.28 ( improved) Agitated overnight requiring Ativan x 2 Precedex is infusing at 0.46mcg/kg Pt. Is obtunded, in NAD Net + 4 L 800 cc's UO last 24 hours T max 99.3, WBC 18.5   Assessment & Plan:  -Shock- hypovolemic vs. Septic.  Resolved. 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  ( 6 L [place]) - Hypokalemia Plan - continue gentle hydration - continue zosyn x 7 days for  infectious colitis - Sputum Cx and follow micro for worsening infiltrate - Lasix 20 mg x 1 for increasing effusion - Trend and Replete electrolytes prn - try to wean precedex - Continue  empiric high dose thiamine - Dr. Leonel Ramsay from neurology to see and will likely start AEDs, appreciate help - Continue LTVEEG - Will clear c spine clinically if can get more awake - guarded prognosis, 10 admissions in past year, saw palliative in Oct 2021 with patient wanting to be DNR but brother seems to have reversed this.  She does not want feeding tube or long term SNF it appears.  Discussed her case with her brother, he wants her to make this decision so we will work toward getting her to point where she can voice her wishes (if possible).  Addendum 1/18 Palliation has had a long discussion with patient's brother. Pt. Has been made a DNR would like to continue to tx however understands she could further decline or pass away. If no changes in next 24-48hr he will be more open to considering hospice.     Best practice (evaluated daily)  Diet: cortrak>>TF Pain/Anxiety/Delirium protocol (if indicated): precedex VAP protocol (if indicated): N/A DVT prophylaxis: Heparin subcu GI prophylaxis: Protonix Glucose control: Monitor blood sugar Mobility: Bedrest Disposition: ICU pending precedex liberation and assurances can protect airway  Goals of Care:  Discussed with brother [name] [phone] over phone 1/16, full code until patient can make decisions for herself.  May need to re-address if cannot get her encephalopathy improved. Goals of care will be an ongoing conversation with her brother. Appreciate Palliations assistance  Patient critically ill due to acute metabolic encephalopathy Interventions to address  today EEG, AEDs, precedex wean Risk of deterioration without these interventions is high  APP CC time 32 minutes  Magdalen Spatz, MSN, AGACNP-BC [place] for personal pager PCCM on call pager [phone] 11/29/2020 9:57 AM Personal pager: [phone] If unanswered, please page CCM On-call: [phone]

## 2020-11-29 NOTE — Progress Notes (Signed)
vLTM EEG Maintenance complete. No skin breakdown at [facility] FP2 A1. Continue to monitor

## 2020-11-30 ENCOUNTER — Encounter (HOSPITAL_COMMUNITY): Payer: Self-pay | Admitting: Pulmonary Disease

## 2020-11-30 ENCOUNTER — Inpatient Hospital Stay (HOSPITAL_COMMUNITY): Payer: Medicare Other

## 2020-11-30 DIAGNOSIS — Z515 Encounter for palliative care: Secondary | ICD-10-CM | POA: Diagnosis not present

## 2020-11-30 DIAGNOSIS — J9601 Acute respiratory failure with hypoxia: Secondary | ICD-10-CM | POA: Diagnosis not present

## 2020-11-30 DIAGNOSIS — R4182 Altered mental status, unspecified: Secondary | ICD-10-CM | POA: Diagnosis not present

## 2020-11-30 DIAGNOSIS — Z7189 Other specified counseling: Secondary | ICD-10-CM | POA: Diagnosis not present

## 2020-11-30 DIAGNOSIS — J90 Pleural effusion, not elsewhere classified: Secondary | ICD-10-CM | POA: Diagnosis not present

## 2020-11-30 LAB — CBC
HCT: 30.5 % — ABNORMAL LOW (ref 36.0–46.0)
Hemoglobin: 9 g/dL — ABNORMAL LOW (ref 12.0–15.0)
MCH: 22.1 pg — ABNORMAL LOW (ref 26.0–34.0)
MCHC: 29.5 g/dL — ABNORMAL LOW (ref 30.0–36.0)
MCV: 74.9 fL — ABNORMAL LOW (ref 80.0–100.0)
Platelets: 339 10*3/uL (ref 150–400)
RBC: 4.07 MIL/uL (ref 3.87–5.11)
RDW: 16.8 % — ABNORMAL HIGH (ref 11.5–15.5)
WBC: 13.1 10*3/uL — ABNORMAL HIGH (ref 4.0–10.5)
nRBC: 0 % (ref 0.0–0.2)

## 2020-11-30 LAB — BASIC METABOLIC PANEL
Anion gap: 10 (ref 5–15)
BUN: 26 mg/dL — ABNORMAL HIGH (ref 8–23)
CO2: 24 mmol/L (ref 22–32)
Calcium: 8.3 mg/dL — ABNORMAL LOW (ref 8.9–10.3)
Chloride: 112 mmol/L — ABNORMAL HIGH (ref 98–111)
Creatinine, Ser: 1.18 mg/dL — ABNORMAL HIGH (ref 0.44–1.00)
GFR, Estimated: 51 mL/min — ABNORMAL LOW (ref >60)
Glucose, Bld: 95 mg/dL (ref 70–99)
Potassium: 3.9 mmol/L (ref 3.5–5.1)
Sodium: 146 mmol/L — ABNORMAL HIGH (ref 135–145)

## 2020-11-30 LAB — MAGNESIUM: Magnesium: 2.1 mg/dL (ref 1.7–2.4)

## 2020-11-30 LAB — PHOSPHORUS: Phosphorus: 2.1 mg/dL — ABNORMAL LOW (ref 2.5–4.6)

## 2020-11-30 LAB — AMMONIA: Ammonia: 20 umol/L (ref 9–35)

## 2020-11-30 LAB — GLUCOSE, CAPILLARY
Glucose-Capillary: 110 mg/dL — ABNORMAL HIGH (ref 70–99)
Glucose-Capillary: 120 mg/dL — ABNORMAL HIGH (ref 70–99)

## 2020-11-30 MED ORDER — POLYVINYL ALCOHOL 1.4 % OP SOLN
1.0000 [drp] | Freq: Four times a day (QID) | OPHTHALMIC | Status: DC | PRN
  Filled 2020-11-30: qty 15

## 2020-11-30 MED ORDER — MORPHINE BOLUS VIA INFUSION
1.0000 mg | INTRAVENOUS | Status: DC | PRN
  Filled 2020-11-30: qty 2

## 2020-11-30 MED ORDER — HYDRALAZINE HCL 20 MG/ML IJ SOLN
5.0000 mg | INTRAMUSCULAR | Status: DC | PRN
  Administered 2020-11-30: 5 mg via INTRAVENOUS
  Filled 2020-11-30: qty 1

## 2020-11-30 MED ORDER — MORPHINE 100MG IN NS 100ML (1MG/ML) PREMIX INFUSION: 1.0000 mg/h | INTRAVENOUS | Status: DC

## 2020-11-30 MED ORDER — MORPHINE 100MG IN NS 100ML (1MG/ML) PREMIX INFUSION
1.0000 mg/h | INTRAVENOUS | Status: DC
Start: 2020-11-30 — End: 2020-11-30
  Administered 2020-11-30: 1 mg/h via INTRAVENOUS
  Filled 2020-11-30: qty 100

## 2020-11-30 MED ORDER — BIOTENE DRY MOUTH MT LIQD: 15.0000 mL | OROMUCOSAL | Status: DC | PRN

## 2020-11-30 MED ORDER — MORPHINE SULFATE (PF) 4 MG/ML IV SOLN
4.0000 mg | INTRAVENOUS | Status: DC | PRN
  Administered 2020-11-30: 4 mg via INTRAVENOUS
  Filled 2020-11-30: qty 1

## 2020-11-30 MED ORDER — MORPHINE BOLUS VIA INFUSION
2.0000 mg | INTRAVENOUS | Status: DC | PRN
  Filled 2020-11-30: qty 4

## 2020-11-30 MED ORDER — GLYCOPYRROLATE 0.2 MG/ML IJ SOLN
0.3000 mg | INTRAMUSCULAR | Status: DC | PRN
  Administered 2020-11-30: 0.3 mg via INTRAVENOUS
  Filled 2020-11-30: qty 2

## 2020-11-30 MED ORDER — ONDANSETRON HCL 4 MG/2ML IJ SOLN: 4.0000 mg | Freq: Four times a day (QID) | INTRAMUSCULAR | Status: DC | PRN

## 2020-11-30 MED ORDER — LORAZEPAM 2 MG/ML IJ SOLN
1.0000 mg | INTRAMUSCULAR | Status: DC | PRN
  Administered 2020-11-30: 1 mg via INTRAVENOUS
  Filled 2020-11-30: qty 1

## 2020-12-01 LAB — CULTURE, BLOOD (ROUTINE X 2)
Culture: NO GROWTH
Culture: NO GROWTH

## 2020-12-13 NOTE — Death Summary Note (Signed)
DEATH SUMMARY   Patient Details  Name: [name] MRN: [id number] DOB: [birth year]  Admission/Discharge Information   Admit Date:  12-17-2020  Date of Death: Date of Death: 2020/12/21  Time of Death: Time of Death: 03-11-09  Length of Stay: 4  Referring Physician: Nicholes Rough, PA-C   Reason(s) for Hospitalization  Status epilepticus, refractory Severe protein calorie malnutrition present on admission Right lower lobe aspiration pneumonia Severe muscular deconditioning  Brief Hospital Course (including significant findings, care, treatment, and services provided and events leading to death)  This is a 69 year old white female that presented to the emergency room via EMS from home.  Patient's brother had not been in contact with her for approximately 24 hours and called police to do a wellness check.  She was found minimally responsive on the floor.  No history is available about events that led up to finding her on the floor.  She is unable to provide any history.  She was discharged from the hospital on 24 October 2020 following admission for acute metabolic encephalopathy and community-acquired pneumonia.  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  It was refractory to multiple AEDs.  During her stay, patient respiratory status continued to deteriorate.  After discussing patient's progressive failure to thrive over past 4 months, lack of interest in [facility] or SNF, and poor prognosis, brother decided to allow her to pass in peace.   Pertinent Labs and Studies  Significant Diagnostic Studies CT Head Wo Contrast  Result Date: December 17, 2020 CLINICAL DATA:  Patient found down on the bathroom floor. EXAM: CT HEAD WITHOUT CONTRAST TECHNIQUE: Contiguous axial images were obtained from the base of the skull through the vertex without intravenous contrast. COMPARISON:  October 21, 2020 FINDINGS: Brain: There is mild cerebral atrophy with widening of the extra-axial spaces and ventricular  dilatation. There are areas of decreased attenuation within the white matter tracts of the supratentorial brain, consistent with microvascular disease changes. Vascular: No hyperdense vessel or unexpected calcification. Skull: Normal. Negative for fracture or focal lesion. Sinuses/Orbits: No acute finding. Other: None. IMPRESSION: No acute intracranial pathology. Electronically Signed   By: Virgina Norfolk M.D.   On: 12/17/20 17:15   CT Cervical Spine Wo Contrast  Result Date: 12-17-20 CLINICAL DATA:  Patient found down on the bathroom floor. EXAM: CT CERVICAL SPINE WITHOUT CONTRAST TECHNIQUE: Multidetector CT imaging of the cervical spine was performed without intravenous contrast. Multiplanar CT image reconstructions were also generated. COMPARISON:  None. FINDINGS: Alignment: Approximately 1 mm to 2 mm retrolisthesis of the C3 on C4 vertebral body is seen. Skull base and vertebrae: A small deformity of indeterminate age is seen involving an anterior osteophyte along the inferior endplate of the C3 vertebral body (sagittal reformatted image 31, CT series number 8). Soft tissues and spinal canal: No prevertebral fluid or swelling. No visible canal hematoma. Disc levels: Mild endplate sclerosis is seen at the level of C3-C4 and C5-C6, with moderate severity endplate sclerosis seen at the level of C4-C5. Marked severity intervertebral disc space narrowing is seen at the levels of C4-C5 and C5-C6, with mild intervertebral disc space narrowing seen throughout the remainder of the cervical spine. Mild to moderate severity bilateral multilevel facet joint hypertrophy is noted. Upper chest: Negative. Other: None. IMPRESSION: 1. Small deformity of indeterminate age involving an anterior osteophyte along the inferior endplate of the C3 vertebral body. MRI correlation is recommended. 2. Marked severity degenerative changes at the levels of C4-C5 and C5-C6. 3. Approximately 1 mm to 2 mm retrolisthesis  of the C3 on  C4 vertebral body. Electronically Signed   By: Virgina Norfolk M.D.   On: Dec 23, 2020 17:21   MR BRAIN WO CONTRAST  Result Date: 11/27/2020 CLINICAL DATA:  70 year old female found down. Altered mental status, neurologic deficit. EXAM: MRI HEAD WITHOUT CONTRAST TECHNIQUE: Multiplanar, multiecho pulse sequences of the brain and surrounding structures were obtained without intravenous contrast. COMPARISON:  Head CT 12-23-2020.  Brain MRI 10/22/2020. FINDINGS: Brain: No restricted diffusion to suggest acute infarction. No midline shift, mass effect, evidence of mass lesion, ventriculomegaly, extra-axial collection or acute intracranial hemorrhage. Cervicomedullary junction and pituitary are within normal limits. Scattered small but occasionally patchy bilateral cerebral white matter T2 and FLAIR hyperintensity is in a nonspecific configuration and unchanged from last month. The extent is moderate for age. No superimposed cortical encephalomalacia or chronic cerebral blood products identified. The deep gray nuclei, brainstem and cerebellum are relatively spared as before - a questionable small chronic infarct in the left cerebellum on series 9, image 80 today is unchanged. Vascular: Major intracranial vascular flow voids are stable. Skull and upper cervical spine: Normal for age visible cervical spine. Visualized bone marrow signal is within normal limits. Sinuses/Orbits: Orbits are stable and negative. Trace paranasal sinus mucosal thickening is new since last month. No sinus fluid levels. Other: Trace or mild bilateral mastoid fluid is also new. Grossly normal visible other internal auditory structures. Negative visible scalp and face. IMPRESSION: 1. No acute intracranial abnormality. 2. Stable noncontrast MRI appearance of the brain from last month. Moderate for age nonspecific cerebral white matter signal changes, most commonly due to chronic small vessel disease. Possible tiny chronic infarct in the left  cerebellum. Electronically Signed   By: Genevie Ann M.D.   On: 11/27/2020 09:53   DG Pelvis Portable  Result Date: Dec 23, 2020 CLINICAL DATA:  Questionable sepsis. EXAM: PORTABLE PELVIS 1-2 VIEWS COMPARISON:  None. FINDINGS: Nondisplaced fracture across the left side of the pubic symphysis with associated sclerosis. This is of unclear chronicity. No other evidence of a fracture. No bone lesion. Hip joints, SI joints and symphysis pubis are normally aligned. Soft tissues are unremarkable. IMPRESSION: 1. Nondisplaced fracture along the left aspect of the pubic symphysis of unclear chronicity, but possibly recent. 2. No other evidence of an acute or recent abnormality. No bone lesions. Electronically Signed   By: Lajean Manes M.D.   On: 12-23-2020 17:09   DG CHEST PORT 1 VIEW  Result Date: 11/13/2020 CLINICAL DATA:  Unresponsive.  Respiratory failure. EXAM: PORTABLE CHEST 1 VIEW COMPARISON:  11/29/2020.  CT 2020-12-23. FINDINGS: Heart size normal. Large hiatal hernia as noted on prior CT. Persistent prominent atelectasis and consolidation right lung base. Small right pleural effusion again noted. Heart size stable. No pneumothorax. Right shoulder replacement. IMPRESSION: 1. Persistent prominent atelectasis and consolidation right lung base. Small right pleural effusion again noted. 2.  Large hiatal hernia as noted on prior CT. Electronically Signed   By: Marcello Moores  Register   On: 12/10/2020 07:37   DG Chest Port 1 View  Result Date: 11/29/2020 CLINICAL DATA:  Wheezing EXAM: PORTABLE CHEST 1 VIEW COMPARISON:  November 27, 2020 FINDINGS: Again noted is a large hiatal hernia. There is worsening consolidation in the right lower lobe and right mid lung zone. There is a growing right-sided pleural effusion. There is no pneumothorax. Aortic calcifications are noted. The heart size is stable. IMPRESSION: Worsening consolidation in the right lower lobe and right mid lung zone with a growing right-sided pleural effusion.  Large hiatal hernia as previously described. Electronically Signed   By: Constance Holster M.D.   On: 11/29/2020 01:33   DG Chest Portable 1 View  Result Date: 11/19/2020 CLINICAL DATA:  Patient found down on the bathroom floor. EXAM: PORTABLE CHEST 1 VIEW COMPARISON:  October 23, 2020 FINDINGS: The study is limited secondary to patient rotation. Mildly increased opacification, with well-defined border, is seen along the medial aspect of the right lung. There is no evidence of a pleural effusion or pneumothorax. The heart size and mediastinal contours are within normal limits. There is a large gastric hernia. An intact right humeral prosthesis is seen. Degenerative changes seen throughout the thoracic spine. IMPRESSION: 1. Mildly increased opacification along the medial aspect of the right lung which may be, in part, secondary to the previously noted patient rotation. Correlation with chest CT is recommended to further exclude the presence of an underlying hilar or mediastinal abnormality. 2. Large gastric hernia. Electronically Signed   By: Virgina Norfolk M.D.   On: 11/22/2020 17:13   EEG adult  Result Date: 11/27/2020 Lora Havens, MD     11/27/2020  7:31 PM Patient Name: [name] MRN: [id number] Epilepsy Attending: Lora Havens Referring Physician/Provider: Dr Ina Homes Date: 11/28/2019 Duration: 24.46 mins Patient history: 70yo F with AMS. EEG to evaluate for seizure. Level of alertness:  lethargic AEDs during EEG study: None Technical aspects: This EEG study was done with scalp electrodes positioned according to the 10-20 International system of electrode placement. Electrical activity was acquired at a sampling rate of 500Hz  and reviewed with a high frequency filter of 70Hz  and a low frequency filter of 1Hz . EEG data were recorded continuously and digitally stored. Description: EEG showed continuous generalized 3 to 6 Hz theta-delta slowing. Generalized periodic epileptiform  discharges were noted at 0.5hz , at times in runs lasting 2 seconds followed by brief periods of generalized attenuation lasting 1-2 seconds. Hyperventilation and photic stimulation were not performed.   ABNORMALITY -Continuous slow, generalized -Periodic epileptiform discharges, generalized IMPRESSION: This study showed evidence of generalized epileptogenicity non specific etiology but could be secondary to cefepime toxicity. There is also severe diffuse encephalopathy, non specific etiology. No seizures were seen throughout the recording. Priyanka Barbra Sarks   Overnight EEG with video  Result Date: 11/28/2020 Lora Havens, MD     11/29/2020  9:27 AM Patient Name: [name] MRN: [id number] Epilepsy Attending: Lora Havens Referring Physician/Provider: Dr Ina Homes Duration: 11/27/2020 1924 to 11/28/2020 1924  Patient history: 70yo F with AMS. EEG to evaluate for seizure.  Level of alertness:  lethargic  AEDs during EEG study: None  Technical aspects: This EEG study was done with scalp electrodes positioned according to the 10-20 International system of electrode placement. Electrical activity was acquired at a sampling rate of 500Hz  and reviewed with a high frequency filter of 70Hz  and a low frequency filter of 1Hz . EEG data were recorded continuously and digitally stored.  Description: EEG showed continuous generalized 3 to 6 Hz theta-delta slowing. Generalized periodic epileptiform discharges were noted every 2-3 seconds. They appear more sharply contoured and in runs when patient is awake/stimulated lasting 3-5 seconds consistent with brief ictal-interictal rhythmic discharges.   ABNORMALITY -Continuous slow, generalized -Periodic epileptiform discharges, generalized -Brief ictal-interictal rhythmic discharges, generalized  IMPRESSION: This study showed evidence of  generalized  epileptogenicity. Brief ictal-interictal rhythmic discharges were also noted which have high potential for  seizures.  There is also severe diffuse encephalopathy, non specific  etiology. No definite seizures were seen throughout the recording.  Lora Havens   CT CHEST ABDOMEN PELVIS WO CONTRAST  Result Date: 12/04/2020 CLINICAL DATA:  Unwitnessed fall. EXAM: CT CHEST, ABDOMEN AND PELVIS WITHOUT CONTRAST TECHNIQUE: Multidetector CT imaging of the chest, abdomen and pelvis was performed following the standard protocol without IV contrast. COMPARISON:  October 13, 2020. FINDINGS: CT CHEST FINDINGS Cardiovascular: Atherosclerosis of thoracic aorta is noted without aneurysm formation. Normal cardiac size. No pericardial effusion. Mediastinum/Nodes: Large sliding-type hiatal hernia is noted. Thyroid gland is unremarkable. No adenopathy is noted. Lungs/Pleura: No pneumothorax or pleural effusion is noted. Left lung is clear. Mild right posterior basilar subsegmental atelectasis is noted. Musculoskeletal: No chest wall mass or suspicious bone lesions identified. CT ABDOMEN PELVIS FINDINGS Hepatobiliary: No gallstones or biliary dilatation is noted. Hepatic steatosis is noted. Pancreas: Unremarkable. No pancreatic ductal dilatation or surrounding inflammatory changes. Spleen: Normal in size without focal abnormality. Adrenals/Urinary Tract: Adrenal glands and kidneys appear normal. No hydronephrosis or renal obstruction is noted. No renal or ureteral calculi are noted. Urinary bladder is decompressed secondary to Foley catheter. Stomach/Bowel: The appendix appears normal. Wall thickening and inflammatory changes are seen involving most of the colon, most consistent with infectious or inflammatory colitis. There are noted several small bowel loops in the left lower quadrant which demonstrate wall thickening suggesting enteritis. Vascular/Lymphatic: Aortic atherosclerosis. No enlarged abdominal or pelvic lymph nodes. Reproductive: Status post hysterectomy. No adnexal masses. Other: No abdominal wall hernia or abnormality.  No abdominopelvic ascites. Musculoskeletal: No acute or significant osseous findings. IMPRESSION: 1. Large sliding-type hiatal hernia. 2. Hepatic steatosis. 3. Wall thickening and inflammatory changes are seen involving most of the colon, most consistent with infectious or inflammatory colitis. 4. There are noted several small bowel loops in the left lower quadrant which demonstrate wall thickening suggesting enteritis. Aortic Atherosclerosis (ICD10-I70.0). Electronically Signed   By: Marijo Conception M.D.   On: 11/28/2020 19:33    Microbiology Recent Results (from the past 240 hour(s))  Blood Culture (routine x 2)     Status: None   Collection Time: 12/09/2020  5:10 PM   Specimen: BLOOD LEFT WRIST  Result Value Ref Range Status   Specimen Description BLOOD LEFT WRIST  Final   Special Requests   Final    BOTTLES DRAWN AEROBIC AND ANAEROBIC Blood Culture results may not be optimal due to an inadequate volume of blood received in culture bottles   Culture   Final    NO GROWTH 5 DAYS Performed at [facility], [place] [address], [place], [place] [address]    Report Status 12/01/2020 FINAL  Final  Blood Culture (routine x 2)     Status: None   Collection Time: 12/05/2020  5:38 PM   Specimen: BLOOD LEFT HAND  Result Value Ref Range Status   Specimen Description BLOOD LEFT HAND  Final   Special Requests   Final    BOTTLES DRAWN AEROBIC AND ANAEROBIC Blood Culture results may not be optimal due to an inadequate volume of blood received in culture bottles   Culture   Final    NO GROWTH 5 DAYS Performed at [facility], [place] [address] [place], [place] [address]    Report Status 12/01/2020 FINAL  Final  Urine culture     Status: None   Collection Time: 11/13/2020  8:05 PM   Specimen: Urine, Random  Result Value Ref Range Status   Specimen Description URINE, RANDOM  Final  Special Requests NONE  Final   Culture   Final    NO GROWTH Performed at [facility], [place] [address],  [place], [place] [address]    Report Status 11/28/2020 FINAL  Final  Resp Panel by RT-PCR (Flu A&B, Covid) Nasopharyngeal Swab     Status: None   Collection Time: 11/20/2020 10:12 PM   Specimen: Nasopharyngeal Swab; Nasopharyngeal(NP) swabs in vial transport medium  Result Value Ref Range Status   SARS Coronavirus 2 by RT PCR NEGATIVE NEGATIVE Final    Comment: (NOTE) SARS-CoV-2 target nucleic acids are NOT DETECTED.  The SARS-CoV-2 RNA is generally detectable in upper respiratory specimens during the acute phase of infection. The lowest concentration of SARS-CoV-2 viral copies this assay can detect is 138 copies/mL. A negative result does not preclude SARS-Cov-2 infection and should not be used as the sole basis for treatment or other patient management decisions. A negative result may occur with  improper specimen collection/handling, submission of specimen other than nasopharyngeal swab, presence of viral mutation(s) within the areas targeted by this assay, and inadequate number of viral copies(<138 copies/mL). A negative result must be combined with clinical observations, patient history, and epidemiological information. The expected result is Negative.  Fact Sheet for Patients:  EntrepreneurPulse.com.au  Fact Sheet for Healthcare Providers:  IncredibleEmployment.be  This test is no t yet approved or cleared by the [place] FDA and  has been authorized for detection and/or diagnosis of SARS-CoV-2 by FDA under an Emergency Use Authorization (EUA). This EUA will remain  in effect (meaning this test can be used) for the duration of the COVID-19 declaration under Section 564(b)(1) of the Act, 21 U.S.C.section 360bbb-3(b)(1), unless the authorization is terminated  or revoked sooner.       Influenza A by PCR NEGATIVE NEGATIVE Final   Influenza B by PCR NEGATIVE NEGATIVE Final    Comment: (NOTE) The Xpert Xpress SARS-CoV-2/FLU/RSV plus assay is  intended as an aid in the diagnosis of influenza from Nasopharyngeal swab specimens and should not be used as a sole basis for treatment. Nasal washings and aspirates are unacceptable for Xpert Xpress SARS-CoV-2/FLU/RSV testing.  Fact Sheet for Patients: EntrepreneurPulse.com.au  Fact Sheet for Healthcare Providers: IncredibleEmployment.be  This test is not yet approved or cleared by the [place] FDA and has been authorized for detection and/or diagnosis of SARS-CoV-2 by FDA under an Emergency Use Authorization (EUA). This EUA will remain in effect (meaning this test can be used) for the duration of the COVID-19 declaration under Section 564(b)(1) of the Act, 21 U.S.C. section 360bbb-3(b)(1), unless the authorization is terminated or revoked.  Performed at [facility], [place] [address] [place], [place] [address]   C Difficile Quick Screen w PCR reflex     Status: None   Collection Time: 11/12/2020 10:12 PM   Specimen: STOOL  Result Value Ref Range Status   C Diff antigen NEGATIVE NEGATIVE Final   C Diff toxin NEGATIVE NEGATIVE Final   C Diff interpretation No C. difficile detected.  Final    Comment: Performed at [facility], [place] [address], [place], [place] [address]  MRSA PCR Screening     Status: None   Collection Time: 11/12/2020 11:18 PM   Specimen: Nasopharyngeal  Result Value Ref Range Status   MRSA by PCR NEGATIVE NEGATIVE Final    Comment:        The GeneXpert MRSA Assay (FDA approved for NASAL specimens only), is one component of a  comprehensive MRSA colonization surveillance program. It is not intended to diagnose MRSA infection nor to guide or monitor treatment for MRSA infections. Performed at [facility], [place] [address], [place] [address]     Lab Basic Metabolic Panel: Recent Labs  Lab 11/29/20 0118 11/29/20 1325 11/29/20 1705 12-29-20 0204  NA 145 143  --  146*  K 3.1* 3.8  --   3.9  CL 112* 111  --  112*  CO2 21* 21*  --  24  GLUCOSE 83 90  --  95  BUN 30* 27*  --  26*  CREATININE 1.28* 1.23*  --  1.18*  CALCIUM 8.0* 7.9*  --  8.3*  MG 2.5* 2.1 2.1 2.1  PHOS 2.8 2.2* 2.3* 2.1*   Liver Function Tests: No results for input(s): AST, ALT, ALKPHOS, BILITOT, PROT, ALBUMIN in the last 168 hours. No results for input(s): LIPASE, AMYLASE in the last 168 hours. Recent Labs  Lab 2020/12/29 0204  AMMONIA 20   CBC: Recent Labs  Lab 11/29/20 0118 12-29-20 0204  WBC 18.5* 13.1*  HGB 8.3* 9.0*  HCT 27.6* 30.5*  MCV 73.4* 74.9*  PLT 363 339   Cardiac Enzymes: No results for input(s): CKTOTAL, CKMB, CKMBINDEX, TROPONINI in the last 168 hours. Sepsis Labs: Recent Labs  Lab 11/29/20 0118 12-29-2020 0204  WBC 18.5* 13.1*      Candee Furbish 12/05/2020, 1:04 PM

## 2020-12-13 NOTE — Progress Notes (Signed)
   12-07-20 1300  Clinical Encounter Type  Visited With Patient and family together  Visit Type Patient actively dying  Referral From Family  Consult/Referral To [place]  The chaplain responded to a page to support the brother at bedside of the patient. The Brother and close friend requested prayer. The chaplain prayed for peace and clarity. The chaplain provided emotional and social support tot he family at bedside. The chaplain will continue to follow up as needed.

## 2020-12-13 NOTE — Procedures (Addendum)
Patient Name:[name] EAV:[id number] Epilepsy Attending:Nishanth Mccaughan Barbra Sarks Referring Physician/Provider:Dr Ina Homes Duration:1/18/20221924 to 09-Dec-2020 7829  Patient FAOZHYQ:67HQ F with AMS. EEG to evaluate for seizure.  Level of alertness:lethargic  AEDs during EEG study: LEV, VPA, PB  Technical aspects: This EEG study was done with scalp electrodes positioned according to the 10-20 International system of electrode placement. Electrical activity was acquired at a sampling rate of 500Hz  and reviewed with a high frequency filter of 70Hz  and a low frequency filter of 1Hz . EEG data were recorded continuously and digitally stored.   Description: EEG showed generalized periodic epileptiform discharges at 0.5 to 1 Hz. When patient was stimulated, EEG showed 5 to 6 Hz spike and wave followed by 2 to 3 Hz spike and wave followed by EEG attenuation every few seconds noted. There was continuous generalized 3-6hz  theta-delta slowing.   ABNORMALITY -Periodic epileptiform discharges, generalized -Continuousslow, generalized  IMPRESSION: This study showed generalized periodic epileptiform discharges, worse when patient was stimulated which is on the ictal-intetrical continuum .There is also severe diffuse encephalopathy, non specific etiology but most likely secondary to seizures.  Morse Brueggemann Barbra Sarks

## 2020-12-13 NOTE — Progress Notes (Signed)
Nutrition Brief Note  Chart reviewed. Pt with decline in status with hospital death expected per MD notes.  Pt now transitioning to comfort care. TF orders discontinued by MD. NPO No further nutrition interventions warranted at this time.  Please re-consult as needed.   Kerman Passey MS, RDN, LDN, CNSC Registered Dietitian III Clinical Nutrition RD Pager and On-Call Pager Number Located in [place]

## 2020-12-13 NOTE — Progress Notes (Signed)
NAME:  Jasma Seevers, MRN:  [id number], DOB:  [birth year], LOS: 4 ADMISSION DATE:  Dec 10, 2020,   CHIEF COMPLAINT: Altered mental status  Brief History:  70 year old female who presented with altered mental status from home  History of Present Illness:  This is a 70 year old white female that presented to the emergency room via EMS from home.  Patient's brother had not been in contact with her for approximately 24 hours and called police to do a wellness check.  She was found minimally responsive on the floor.  No history is available about events that led up to finding her on the floor.  She is unable to provide any history.  She was discharged from the hospital on 24 October 2020 following admission for acute metabolic encephalopathy and community-acquired pneumonia.  Past Medical History:  Community-acquired pneumonia 56/43 Metabolic encephalopathy 32/95 Sacrum and coccyx fracture 12/21 Hypertension GERD/peptic ulcer disease Syncope Chronic hyponatremia   Significant Diagnostic Tests:  CT head: IMPRESSION: No acute intracranial pathology  CT scan cervical spine: IMPRESSION: 1. Small deformity of indeterminate age involving an anterior osteophyte along the inferior endplate of the C3 vertebral body. MRI correlation is recommended. 2. Marked severity degenerative changes at the levels of C4-C5 and C5-C6. 3. Approximately 1 mm to 2 mm retrolisthesis of the C3 on C4 vertebral body.  CT abdomen: IMPRESSION: 1. Large sliding-type hiatal hernia. 2. Hepatic steatosis. 3. Wall thickening and inflammatory changes are seen involving most of the colon, most consistent with infectious or inflammatory colitis. 4. There are noted several small bowel loops in the left lower quadrant which demonstrate wall thickening suggesting enteritis.  Aortic Atherosclerosis (ICD10-I70.0).   Micro Data:  Influenza: Negative COVID: Negative  Antimicrobials:   Zosyn 1/16  >>   Subjective  Took a turn for worse overnight, worsened respiratory status, tachypneic, unresponsive.  Objective   Blood pressure (!) 156/102, pulse (!) 104, temperature 97.9 F (36.6 C), temperature source Oral, resp. rate (!) 37, height 5\' 2"  (1.575 m), weight 44.2 kg, SpO2 93 %.        Intake/Output Summary (Last 24 hours) at 12/02/2020 0802 Last data filed at 11/25/2020 0500 Gross per 24 hour  Intake 1403.01 ml  Output 2020 ml  Net -616.99 ml   Filed Weights   11/27/20 0449 11/29/20 0600 12/02/2020 0500  Weight: 41.2 kg 41.8 kg 44.2 kg    Examination: Constitutional: ill appearing woman agonally breathing  Eyes: pupils equal sluggish, not tacking Ears, nose, mouth, and throat: MM dry, trachea midline Cardiovascular: tachycardic, ext warm Respiratory: rattling breath sounds, + accessory muscle use, tachypneic Gastrointestinal: soft, +BS Skin: No rashes, normal turgor Neurologic: GCS3 Psychiatric: cannot assess  Renal function stable WBC improved H/H stable  Assessment & Plan:  Hypoxemic respiratory failure due to aspiration pneumonia Recalcitrant encephalopathy being treated as status epilepticus, refractory to multiple AEDs Recurrent hosptalizations, FTT, starvation, severe muscular deconditioning  Unfortunately has taken turn for worse with respiratory status.  In accordance with her previously stated wishes and discussions with [name], we will work to make her comfortable with morphine gtt.   In hospital death expected.  Patient critically ill due to metabolic encephalopathy, respiratory failure Interventions to address this today family discussions, precedex and morphine titration Risk of deterioration without these interventions is high  I personally spent 38 minutes providing critical care not including any separately billable procedures  Erskine Emery MD [facility] 12/06/2020 8:15 AM Personal pager: [phone] If unanswered, please  page CCM On-call: [phone]

## 2020-12-13 NOTE — Progress Notes (Signed)
[facility] Progress Note Patient Name: [name] DOB: February 17, 1951 MRN: [id number]   Date of Service  Dec 24, 2020  HPI/Events of Note  Bedside RN requesting an order for PRN blood pressure medications for patient, BP is currently 139/95, MAP 107 but was 145/117 with MAP of 124 when the request was originally made.  eICU Interventions  Will order PRN Hydralazine for SBP  > 160 mmHg or MAP  > 115 mmHg.        Kerry Kass Edvardo Honse 24-Dec-2020, 1:36 AM

## 2020-12-13 NOTE — Progress Notes (Signed)
LTM EEG discontinued - no skin breakdown at unhook.   

## 2020-12-13 NOTE — Progress Notes (Signed)
Subjective: has had worsening respiratory status overnight.  ROS: Unable to obtain due to poor mental status  Examination  Vital signs in last 24 hours: Temp:  [97.3 F (36.3 C)-97.9 F (36.6 C)] 97.9 F (36.6 C) (01/19 0737) Pulse Rate:  [64-104] 104 (01/19 0800) Resp:  [12-37] 37 (01/19 0800) BP: (95-172)/(67-112) 155/93 (01/19 0800) SpO2:  [92 %-100 %] 93 % (01/19 0800) Weight:  [44.2 kg] 44.2 kg (01/19 0500)  General: lying in bed, cachetic looking CVS: pulse-normal rate and rhythm RS: coarse breath sounds BL Extremities: warm  Neuro: comatose, doesn't open eyes to noxious stimuli, doesn't follow commands, PERLA, corneal reflex absent, doesn't withdraw to noxious stimulation in all extremities  Basic Metabolic Panel: Recent Labs  Lab 11/27/20 0345 11/27/20 0851 11/28/20 0055 11/29/20 0118 11/29/20 1325 11/29/20 1705 12/10/20 0204  NA 135 132* 140 145 143  --  146*  K 4.4 3.7 3.6 3.1* 3.8  --  3.9  CL 101  --  106 112* 111  --  112*  CO2 16*  --  20* 21* 21*  --  24  GLUCOSE 150*  --  94 83 90  --  95  BUN 37*  --  38* 30* 27*  --  26*  CREATININE 2.28*  --  1.94* 1.28* 1.23*  --  1.18*  CALCIUM 8.2*  --  8.1* 8.0* 7.9*  --  8.3*  MG 1.8  --  2.9* 2.5* 2.1 2.1 2.1  PHOS 6.0*  --  4.5 2.8 2.2* 2.3* 2.1*    CBC: Recent Labs  Lab 11/23/2020 1636 11/13/2020 1640 11/27/20 0345 11/27/20 0851 11/28/20 0055 11/29/20 0118 December 10, 2020 0204  WBC 27.3*  --  21.0*  --  17.6* 18.5* 13.1*  NEUTROABS 24.0*  --   --   --   --   --   --   HGB 9.4*   < > 9.0* 10.2* 8.0* 8.3* 9.0*  HCT 32.9*   < > 27.9* 30.0* 25.0* 27.6* 30.5*  MCV 78.7*  --  71.5*  --  72.0* 73.4* 74.9*  PLT 609*  --  483*  --  376 363 339   < > = values in this interval not displayed.     Coagulation Studies: No results for input(s): LABPROT, INR in the last 72 hours.  Imaging No new brain imaging overnight  ASSESSMENT AND PLAN: 70 yo female who apparently was living at home prior to several hospital  admissions in Dec 2021. On this admission, she was found down by police and diagnosed with septic shock vs hypovolemic shock, lactic acidosis, rhabdomyolysis, metabolic derangements, and colitis.   New onset status epilepticus ( resolved) Acute encephalopathy, multifactorial - LTM eeg continues to show generalized periodic epileptiform discharges, worse when patient was stimulated which is on the ictal-intetrical continuum as well as severe diffuse encephalopathy - Encephalopathy due to seizure, medications, shock  Recommendations - Continue keppra, depakote and phenobarb at current dose - Discontinue LTM eeg as patient transitioning to comfort care - seizure precautions - prn iv ativan 1-2 mg for clinical sz   I have spent a total of  25  minutes with the patient reviewing hospital notes,  test results, labs and examining the patient as well as establishing an assessment and plan.  > 50% of time was spent in direct patient care.    Zeb Comfort Epilepsy [facility] For questions after 5pm please refer to AMION to reach the Neurologist on call

## 2020-12-13 NOTE — Progress Notes (Signed)
   Daily Progress Note   Patient Name: [name] [facility]       Date: December 26, 2020 DOB: 05-31-1951  Age: 70 y.o. MRN#: [id number] Attending Physician: Candee Furbish, MD Primary Care Physician: Nicholes Rough, PA-C Admit Date: 12/07/2020  Reason for Consultation/Follow-up: Establishing goals of care, Non pain symptom management, Pain control and Terminal Care  Chart Reviewed and Updates Received.   Patient with worsening respiratory failure overnight. Unresponsive.   Brother [name]) at the bedside. After discussion with Dr. Tamala Julian brother expressed goal is to focus on comfort. Emotional support provided.   I discussed at length providing education on what comfort care for patient will continue to look like, medications for symptom management, and anticipated hospital death. Brother verbalized understanding and appreciation. Recent visit with Chaplain and he is appreciative of their time and prayers.   Mr. Nifong is emotional expressing his hopes for improvement but knows "this is God's will" and patient will no longer suffer. Support provided.   He again confirms wishes for patient's comfort and to eliminate any suffering at all possible. Patient has been started on morphine drip for comfort and air hunger. Discussed additional medications that are available in the event of further symptom management needs.   Mr. [name] expresses his appreciation of all care and support.   All questions answered.   Length of Stay: 4 days  Vital Signs: BP (!) 155/93 (BP Location: Right Arm)   Pulse (!) 104   Temp 97.9 F (36.6 C) (Oral)   Resp (!) 37   Ht 5\' 2"  (1.575 m)   Wt 44.2 kg   SpO2 93%   BMI 17.82 kg/m  SpO2: SpO2: 93 % O2 Device: O2 Device: High Flow Nasal Cannula O2 Flow Rate: O2 Flow Rate (L/min): (S) 12 L/min       Palliative Care Assessment & Plan   Goals of Care/Recommendations:  DNR/DNI  All care has been transition to full comfort  Will discontinue orders not focused on comfort.   Continue Morphine drip as ordered with available boluses via infusion PRN for pain/air hunger/comfort Robinul PRN for excessive secretions Ativan PRN for agitation/anxiety Zofran PRN for nausea Liquifilm tears PRN for dry eyes Comfort cart for family Unrestricted visitations in the setting of EOL (per policy) Oxygen PRN 2L or less for comfort. No escalation.   Anticipated hospital death   Prognosis: hours-days  Discharge Planning: Anticipated Hospital Death  Thank you for allowing the Palliative Medicine Team to assist in the care of this patient.  Time Total: 25 min.   Visit consisted of counseling and education dealing with the complex and emotionally intense issues of symptom management and palliative care in the setting of serious and potentially life-threatening illness.Greater than 50%  of this time was spent counseling and coordinating care related to the above assessment and plan.  Alda Lea, AGPCNP-BC  Palliative Medicine Team [phone]

## 2020-12-13 DEATH — deceased

## 2021-12-03 IMAGING — CT CT HEAD W/O CM
4 series · 17 of 47 positions shown, 19 images · non-contrast
Comparison: 05/19/2020

CLINICAL DATA: Altered mental status

EXAM:
CT HEAD WITHOUT CONTRAST
TECHNIQUE: Contiguous axial images were obtained from the base of the skull
through the vertex without intravenous contrast.

[Series 3: head without · axial · non-contrast · 0.43mm/px · z∈[-262,-122]mm · 7 of 38 slices shown, 9 images]
[im 5/38  brain]
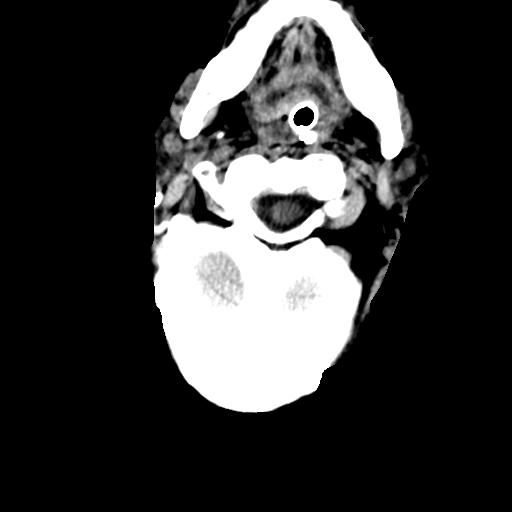
[im 5/38  bone]
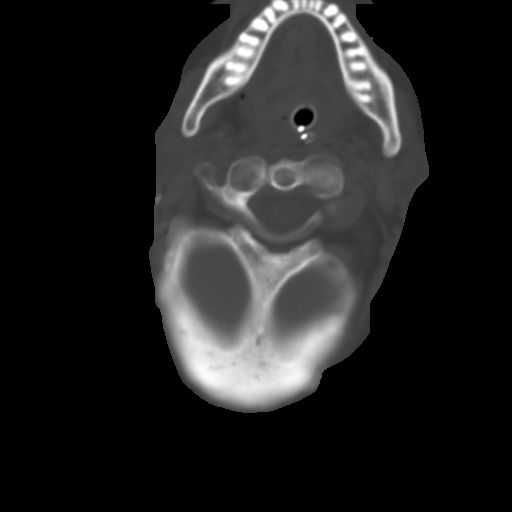
[im 10/38  brain]
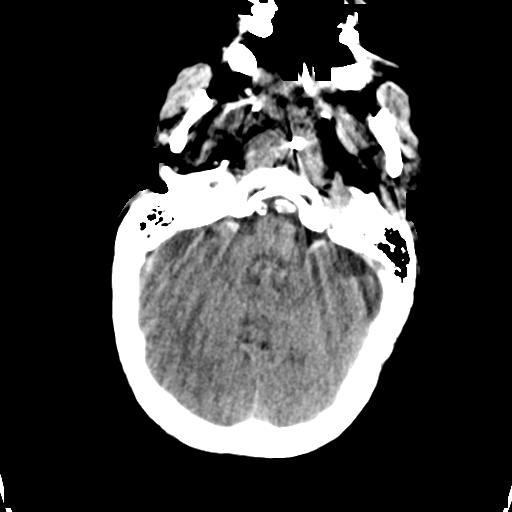
[im 14/38  brain]
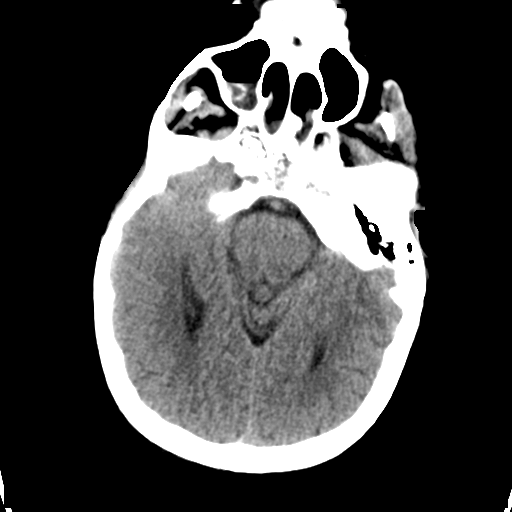
[im 19/38  brain]
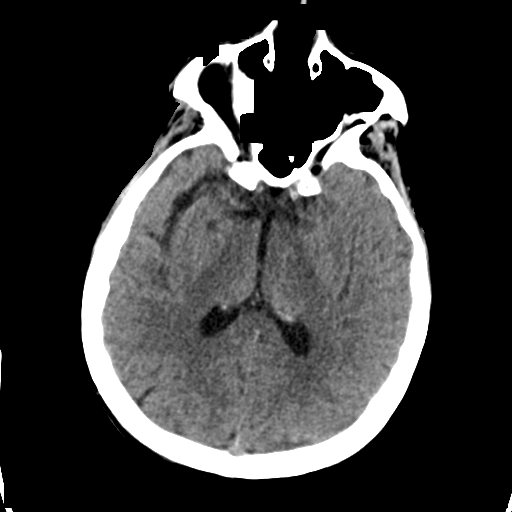
[im 24/38  brain]
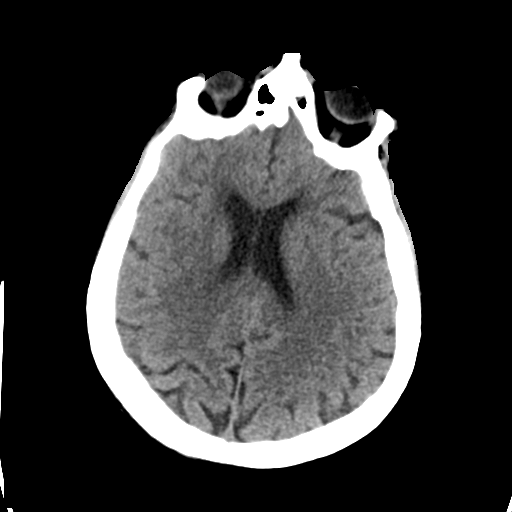
[im 24/38  bone]
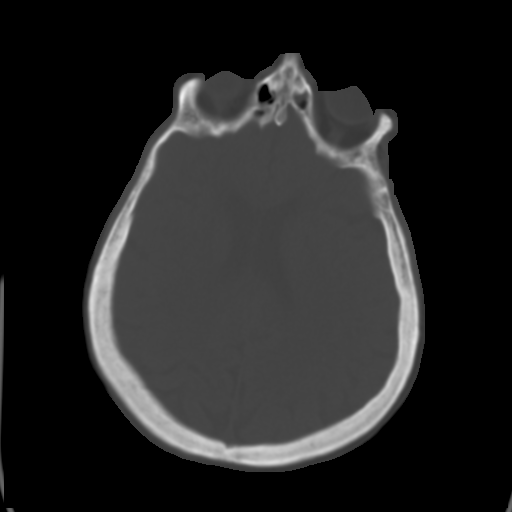
[im 28/38  brain]
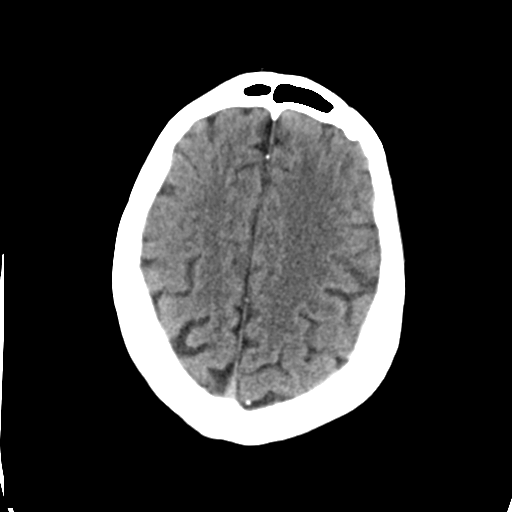
[im 33/38  brain]
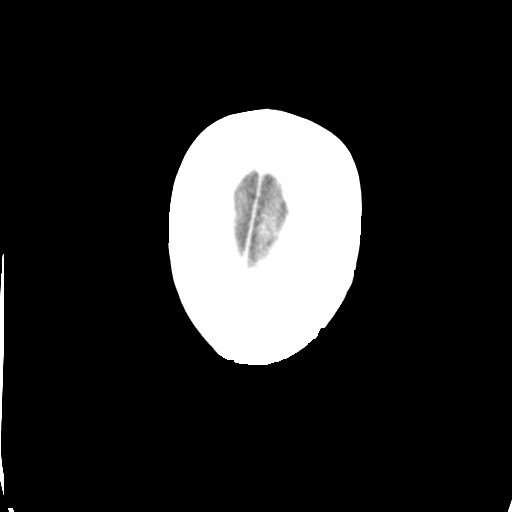

[Series 4: head bone · axial · 0.43mm/px · z∈[-264,-200]mm · 4 of 94 slices shown]
[im 10/94  bone]
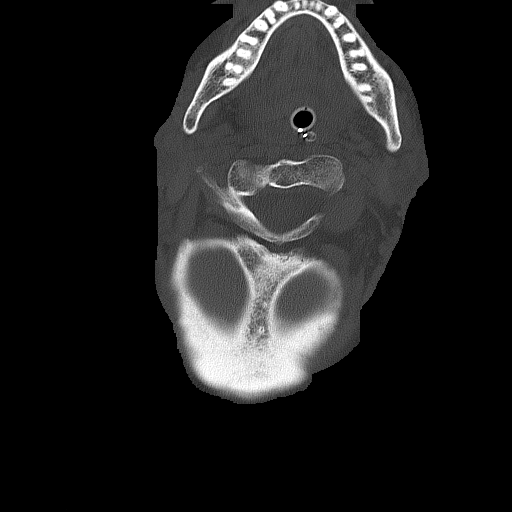
[im 19/94  bone]
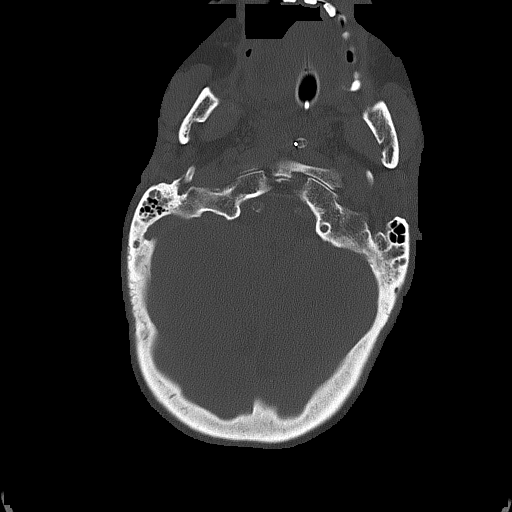
[im 28/94  bone]
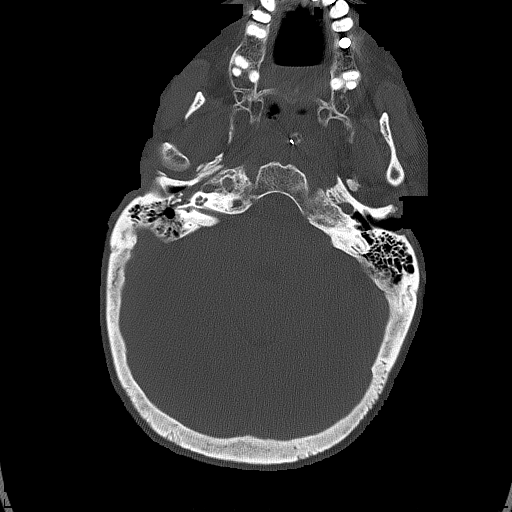
[im 42/94  bone]
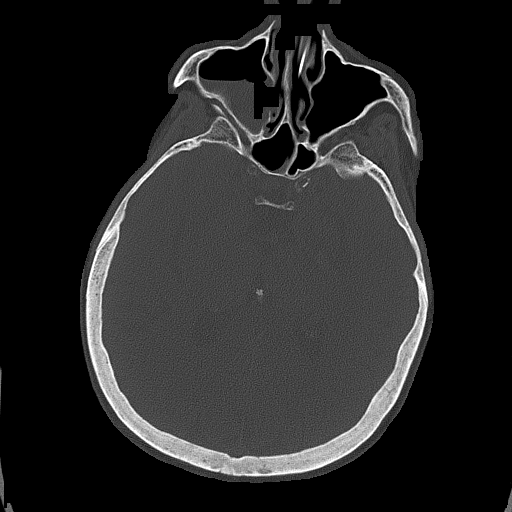

[Series 5: head without cor · coronal · non-contrast · 0.35mm/px · 3 of 67 slices shown]
[im 23/67  brain]
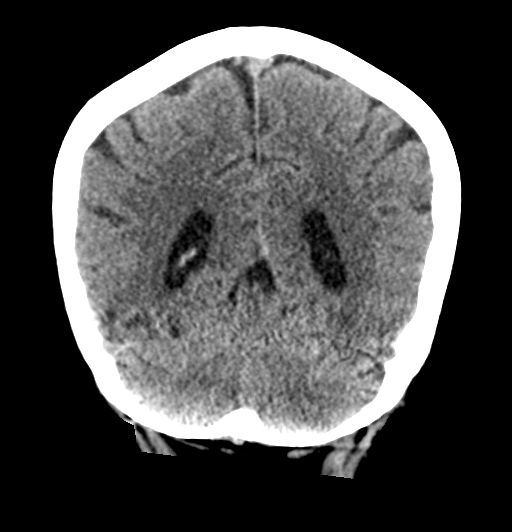
[im 30/67  brain]
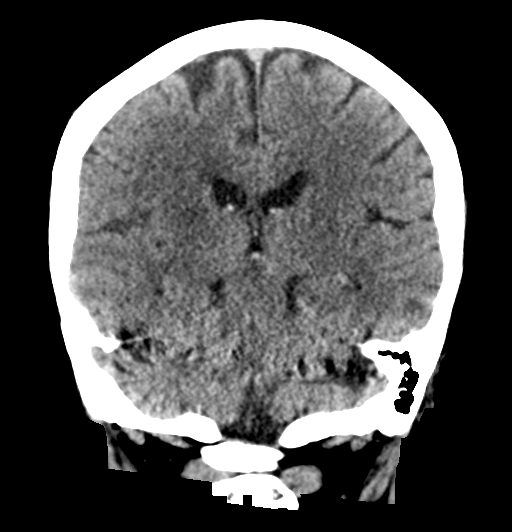
[im 37/67  brain]
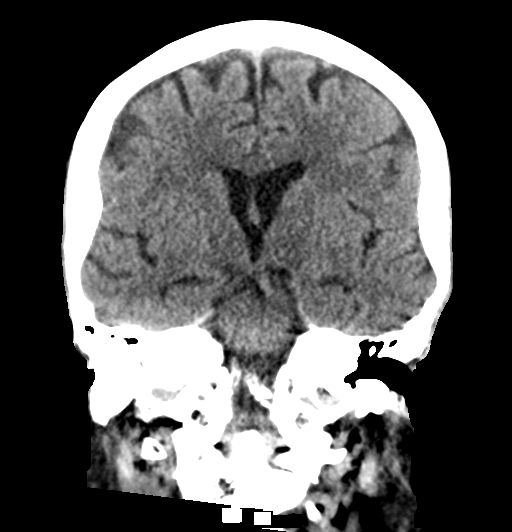

[Series 6: head without sag · sagittal · non-contrast · 0.37mm/px · 3 of 52 slices shown]
[im 18/52  brain]
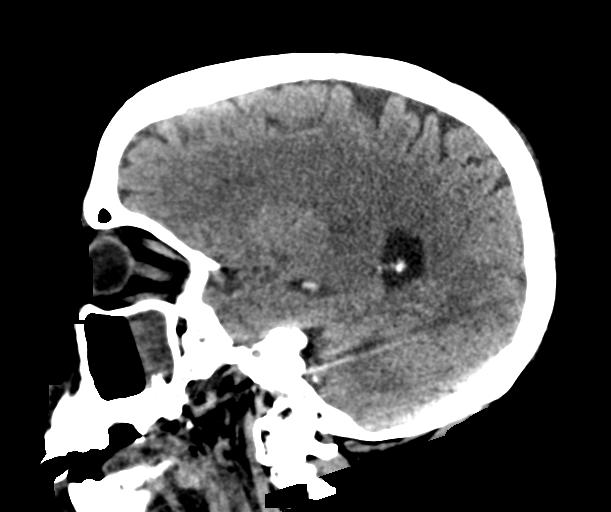
[im 26/52  brain]
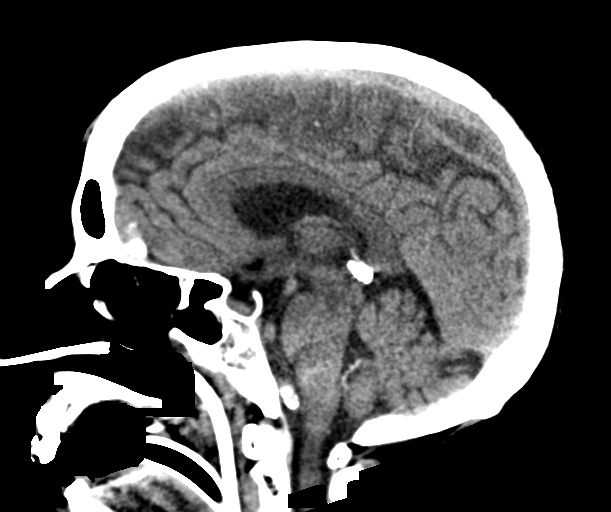
[im 35/52  brain]
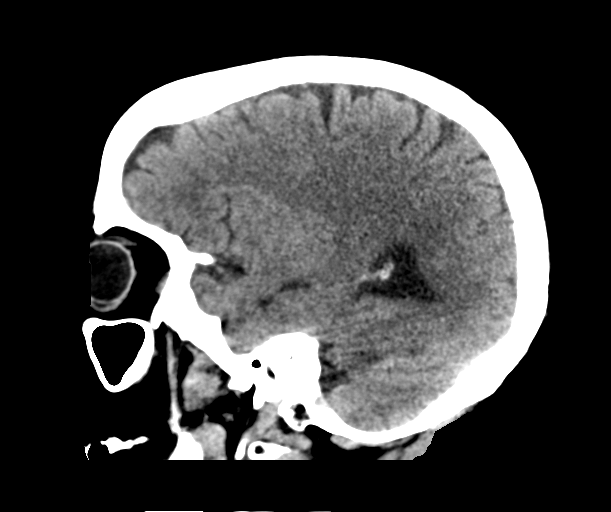

[17 of 47 positions shown; findings below may reference images not displayed]

FINDINGS: Brain: No evidence of acute infarction, hemorrhage, hydrocephalus,
extra-axial collection or mass lesion/mass effect. Patchy white
matter low-density, chronic and likely from small vessel disease.

Vascular: Atheromatous calcification

Skull: Normal. Negative for fracture or focal lesion.

Sinuses/Orbits: Fluid level in the right maxillary sinus and
opacified right middle ear.
IMPRESSION: 1. No acute intracranial finding or change from prior.
2. Chronic white matter disease.
3. Right maxillary sinus and middle ear opacification.

## 2021-12-04 IMAGING — DX DG ABDOMEN 1V
1 series · 1 of 1 positions shown · non-contrast
Comparison: 08/29/2020

CLINICAL DATA: Nasogastric tube placement

EXAM:
ABDOMEN - 1 VIEW

[abdomen kub]
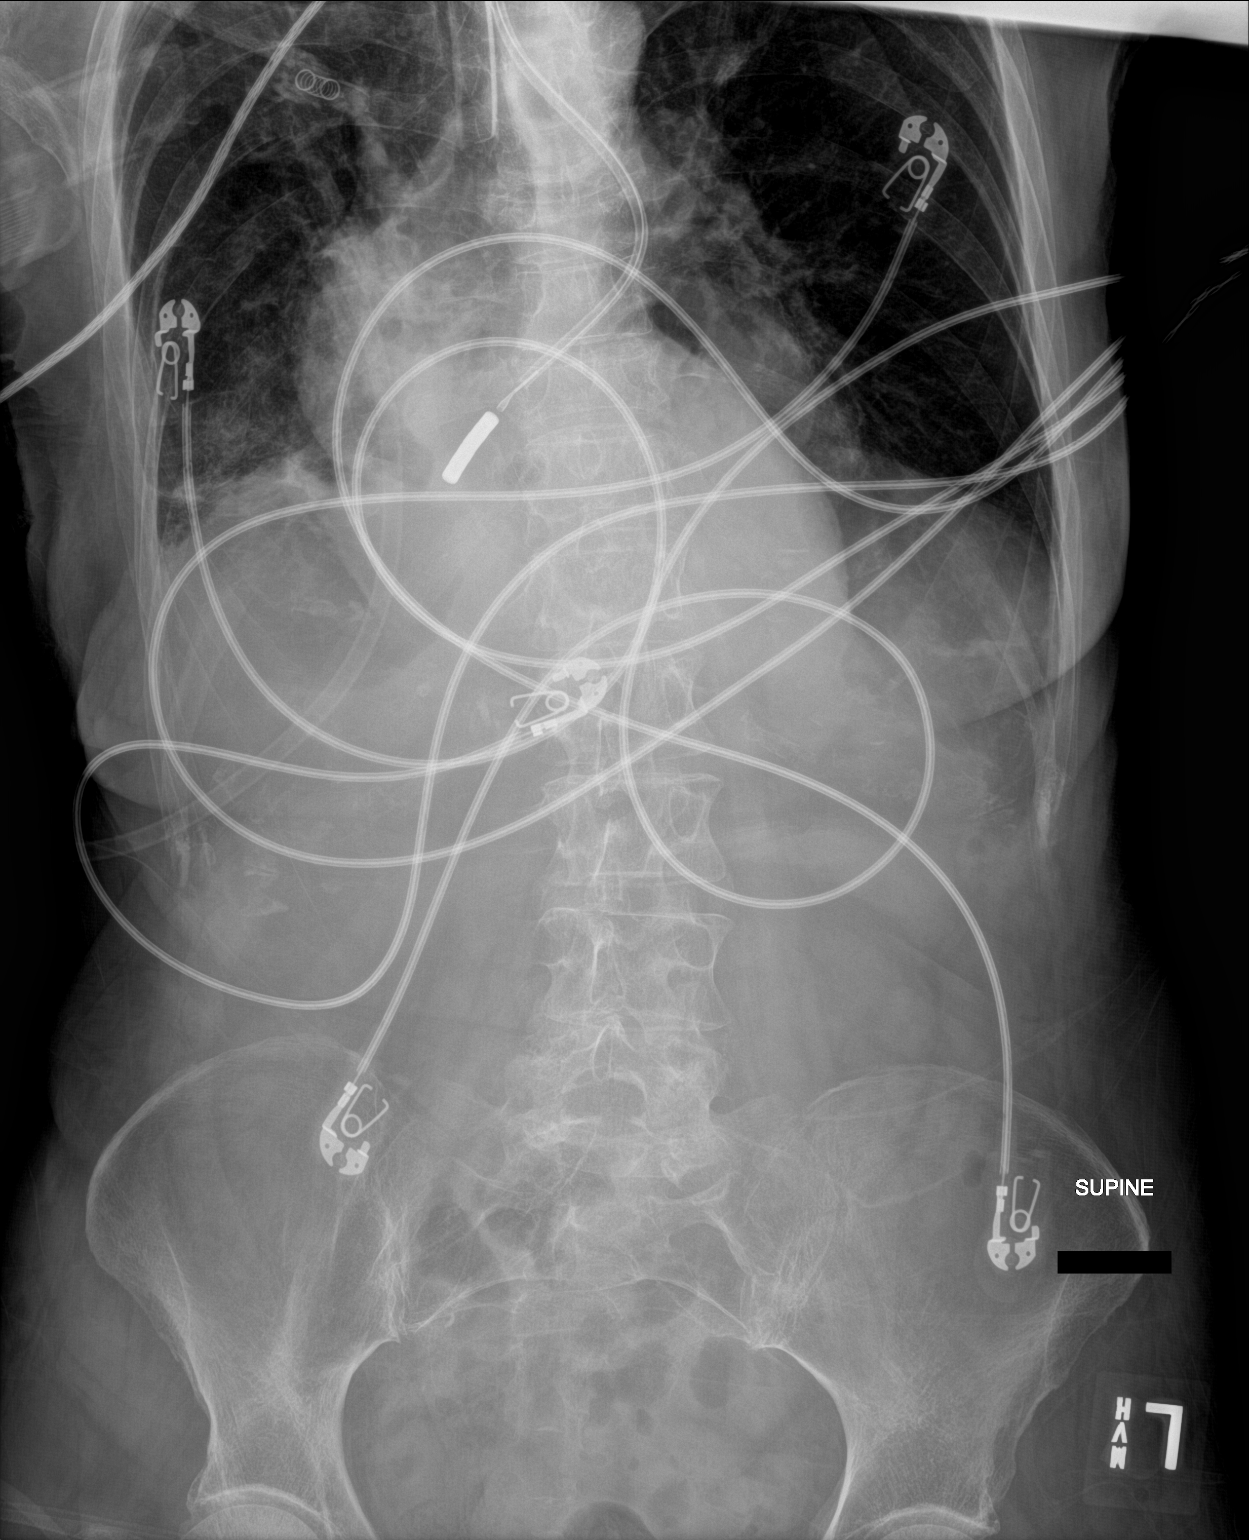

[1 of 1 positions shown; findings below may reference images not displayed]

FINDINGS: Previously placed nasogastric tube has been removed. A new
nasoenteric feeding tube is seen with its tip within the
intrathoracic stomach within the hiatal hernia. Large hiatal hernia
again noted. Right basilar atelectasis or infiltrate is present.
Endotracheal tube is seen 1.5 cm above the carina. Nonspecific
abdominal gas pattern.
IMPRESSION: Nasoenteric feeding tube tip within the intrathoracic stomach.

## 2021-12-04 IMAGING — DX DG CHEST 1V PORT
1 series · 1 of 1 positions shown · non-contrast
Comparison: Two days ago

CLINICAL DATA: Respiratory failure

EXAM:
PORTABLE CHEST 1 VIEW

[chest ap]
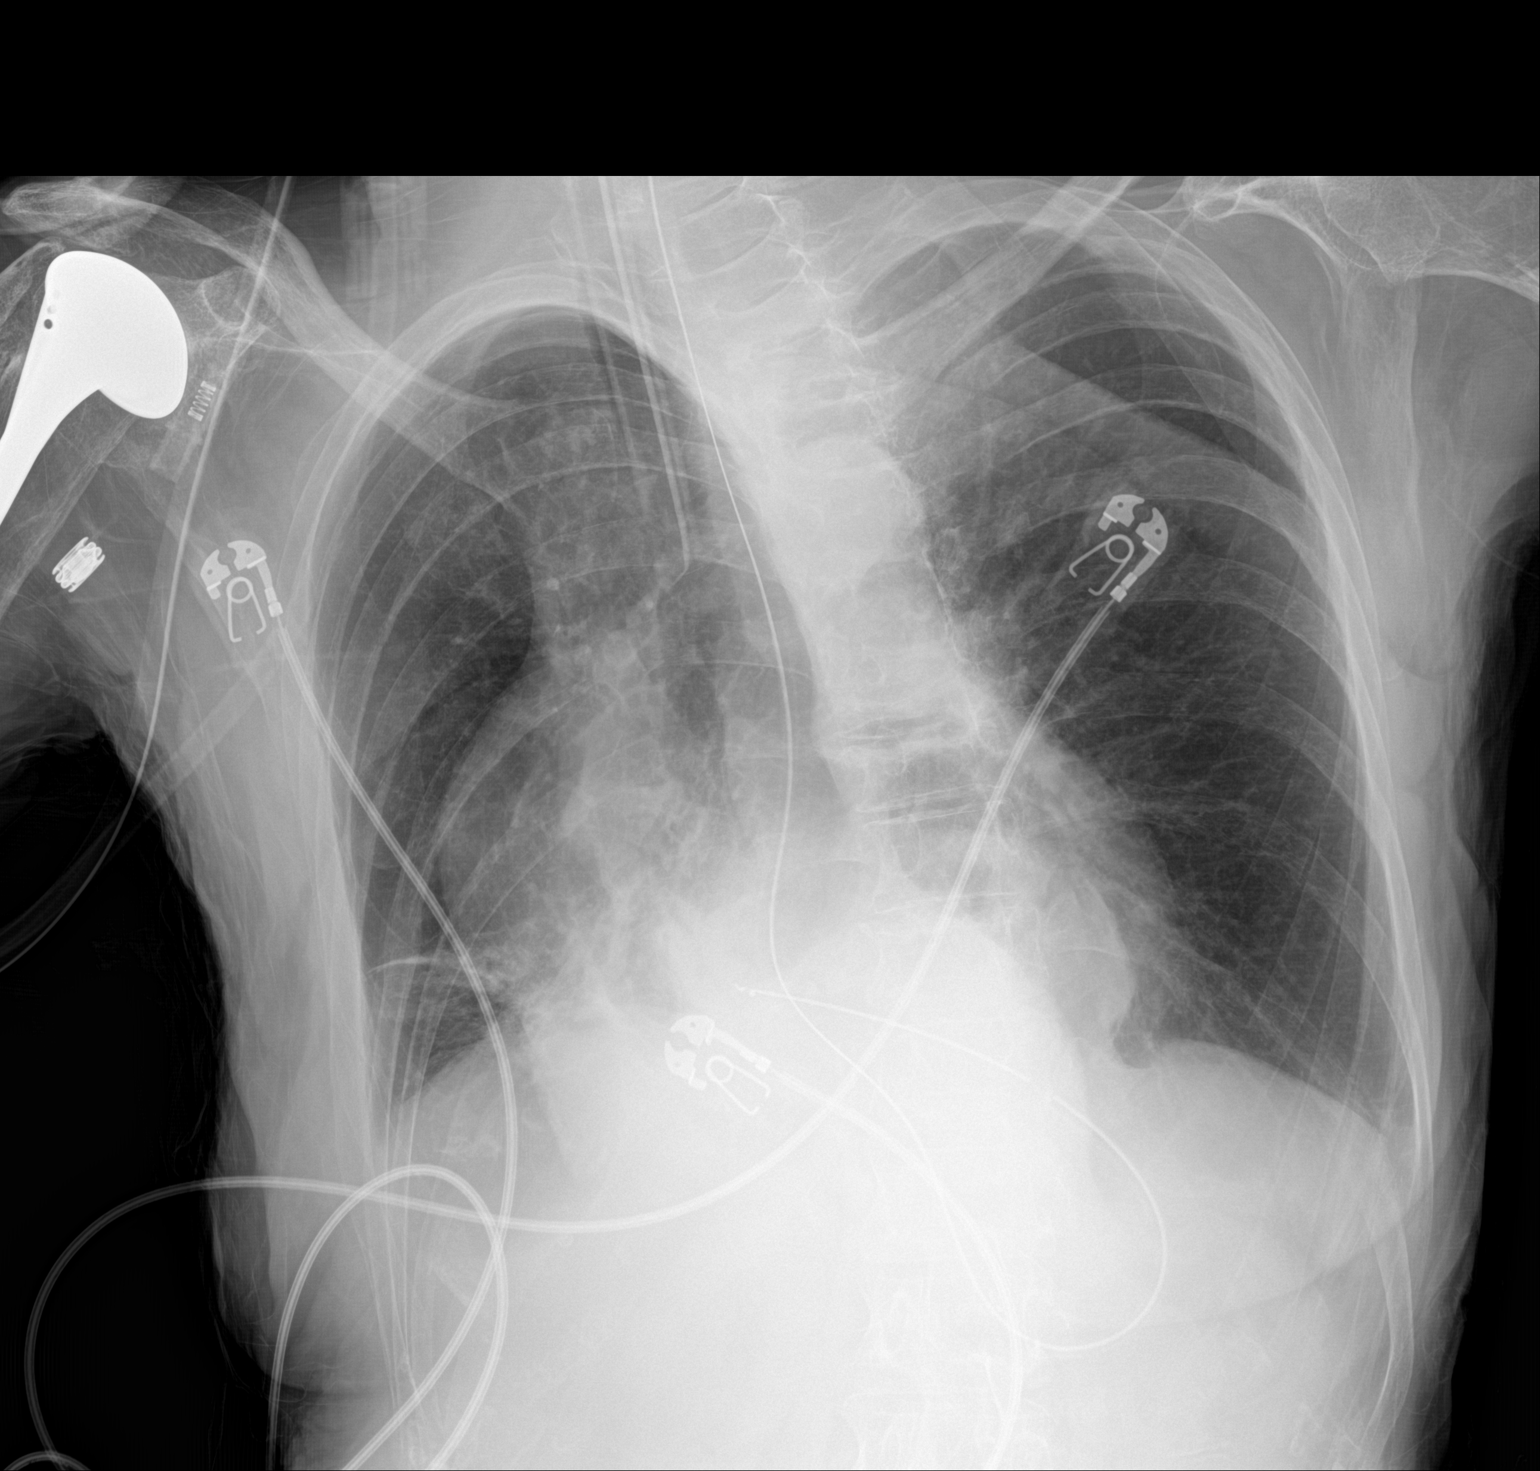

[1 of 1 positions shown; findings below may reference images not displayed]

FINDINGS: The enteric tube is looped in a hiatal hernia.

The endotracheal tube tip is just below the clavicular heads.

Right more than left pulmonary opacity with volume loss as seen on
chest CT from yesterday. No visible effusion or pneumothorax.
IMPRESSION: 1. Unremarkable hardware positioning when accounting for hiatal
hernia.
2. Stable degree of right more than left airspace disease.

## 2021-12-06 IMAGING — RF DG C-ARM 1-60 MIN
1 series · 2 of 2 positions shown · non-contrast
Comparison: Chest radiograph September 01, 2020

CLINICAL DATA: Feeding tube placement attempt

EXAM:
INTRAOPERATIVE CHEST RADIOGRAPH: 1 V

[Series 1: cp_standard · 0.29mm/px · 2 of 2 slices shown]
[im 1/2]
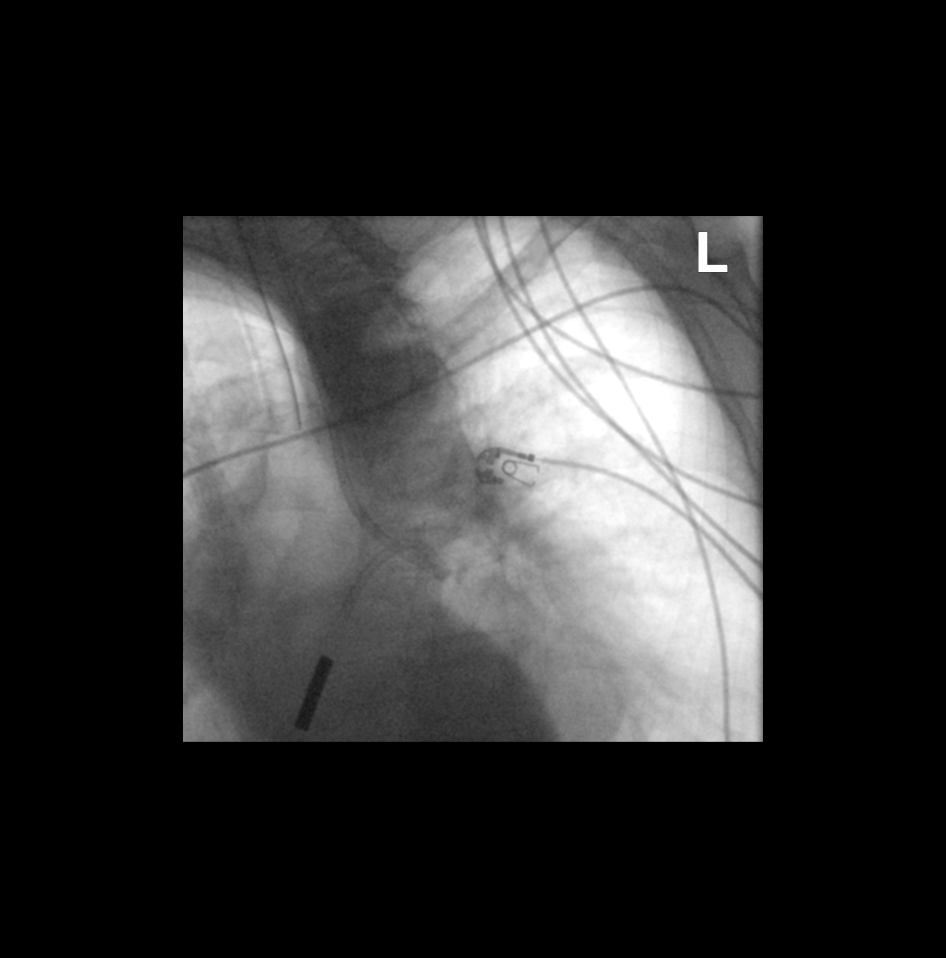
[im 2/2]
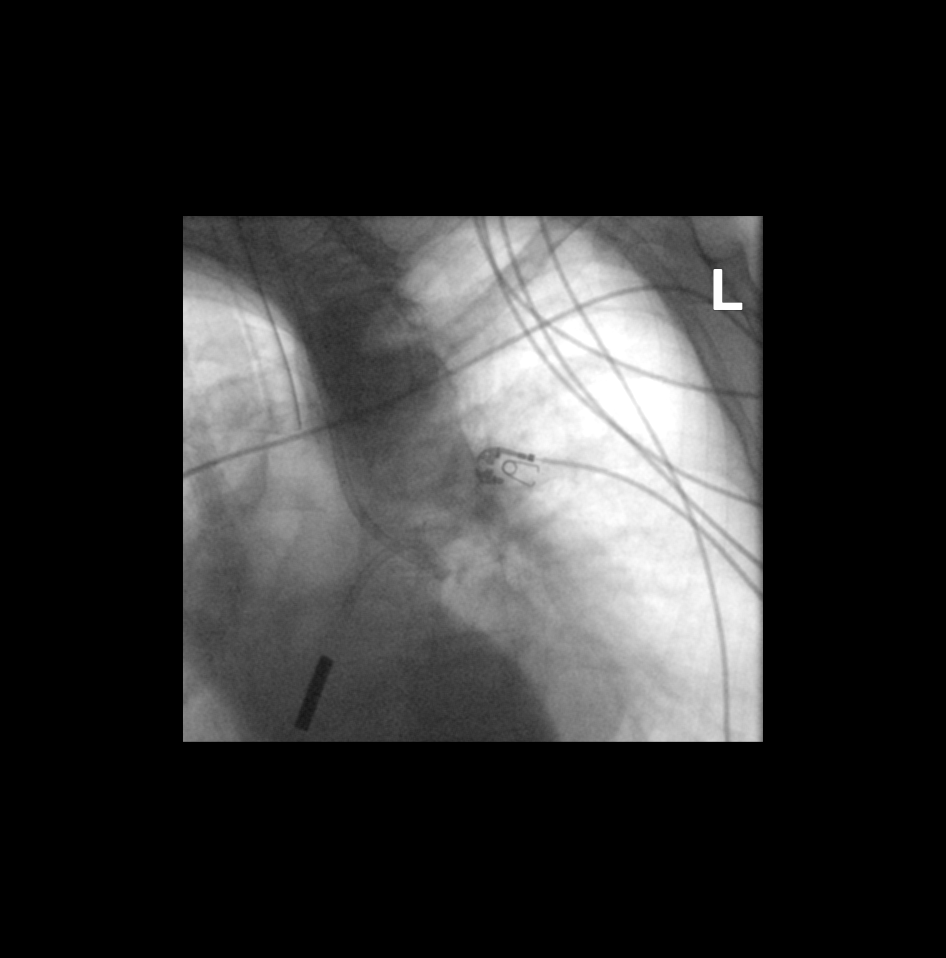

[2 of 2 positions shown; findings below may reference images not displayed]

FINDINGS: Single apparent fluoroscopic image shows feeding tube in region of
distal esophagus, unchanged from radiographic examination earlier in
the day. Endotracheal tube present with tip 2.1 cm above the carina.
IMPRESSION: Feeding tube tip in distal esophagus, stable from earlier in the
day. Endotracheal tube as described.
